# Patient Record
Sex: Female | Born: 1945
Health system: Southern US, Community
[De-identification: ages and names within clinical notes are randomized; demographics above are authoritative.]

## PROBLEM LIST (undated history)

## (undated) DIAGNOSIS — G2401 Drug induced subacute dyskinesia: Secondary | ICD-10-CM

## (undated) DIAGNOSIS — I10 Essential (primary) hypertension: Secondary | ICD-10-CM

## (undated) DIAGNOSIS — D509 Iron deficiency anemia, unspecified: Secondary | ICD-10-CM

## (undated) DIAGNOSIS — N183 Chronic kidney disease, stage 3 unspecified: Secondary | ICD-10-CM

## (undated) DIAGNOSIS — R269 Unspecified abnormalities of gait and mobility: Secondary | ICD-10-CM

## (undated) DIAGNOSIS — I890 Lymphedema, not elsewhere classified: Secondary | ICD-10-CM

## (undated) DIAGNOSIS — F319 Bipolar disorder, unspecified: Secondary | ICD-10-CM

## (undated) DIAGNOSIS — M1990 Unspecified osteoarthritis, unspecified site: Secondary | ICD-10-CM

## (undated) DIAGNOSIS — T4145XA Adverse effect of unspecified anesthetic, initial encounter: Secondary | ICD-10-CM

## (undated) DIAGNOSIS — N281 Cyst of kidney, acquired: Secondary | ICD-10-CM

## (undated) DIAGNOSIS — T8859XA Other complications of anesthesia, initial encounter: Secondary | ICD-10-CM

## (undated) DIAGNOSIS — G629 Polyneuropathy, unspecified: Secondary | ICD-10-CM

## (undated) DIAGNOSIS — R609 Edema, unspecified: Secondary | ICD-10-CM

## (undated) DIAGNOSIS — E785 Hyperlipidemia, unspecified: Secondary | ICD-10-CM

## (undated) DIAGNOSIS — E119 Type 2 diabetes mellitus without complications: Secondary | ICD-10-CM

## (undated) DIAGNOSIS — G4733 Obstructive sleep apnea (adult) (pediatric): Secondary | ICD-10-CM

## (undated) DIAGNOSIS — Z9989 Dependence on other enabling machines and devices: Secondary | ICD-10-CM

## (undated) HISTORY — DX: Cyst of kidney, acquired: N28.1

## (undated) HISTORY — DX: Hyperlipidemia, unspecified: E78.5

## (undated) HISTORY — DX: Chronic kidney disease, stage 3 (moderate): N18.3

## (undated) HISTORY — PX: FRACTURE SURGERY: SHX138

## (undated) HISTORY — PX: COLONOSCOPY: SHX174

## (undated) HISTORY — DX: Obstructive sleep apnea (adult) (pediatric): G47.33

## (undated) HISTORY — DX: Lymphedema, not elsewhere classified: I89.0

## (undated) HISTORY — DX: Chronic kidney disease, stage 3 unspecified: N18.30

## (undated) HISTORY — DX: Essential (primary) hypertension: I10

## (undated) HISTORY — DX: Polyneuropathy, unspecified: G62.9

## (undated) HISTORY — DX: Type 2 diabetes mellitus without complications: E11.9

## (undated) HISTORY — DX: Unspecified osteoarthritis, unspecified site: M19.90

## (undated) HISTORY — DX: Drug induced subacute dyskinesia: G24.01

## (undated) HISTORY — DX: Edema, unspecified: R60.9

## (undated) HISTORY — DX: Dependence on other enabling machines and devices: Z99.89

## (undated) HISTORY — DX: Iron deficiency anemia, unspecified: D50.9

## (undated) HISTORY — PX: ABDOMINAL HYSTERECTOMY: SHX81

## (undated) HISTORY — PX: DILATION AND CURETTAGE OF UTERUS: SHX78

## (undated) HISTORY — DX: Bipolar disorder, unspecified: F31.9

---
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
## 1994-11-18 HISTORY — PX: REIMPLANT URETER IN BLADDER: SHX488

## 2006-05-08 ENCOUNTER — Ambulatory Visit: Payer: Self-pay | Admitting: Family Medicine

## 2006-07-01 ENCOUNTER — Ambulatory Visit: Payer: Self-pay | Admitting: Family Medicine

## 2006-07-09 ENCOUNTER — Ambulatory Visit: Payer: Self-pay | Admitting: Internal Medicine

## 2006-07-16 ENCOUNTER — Encounter: Admission: RE | Admit: 2006-07-16 | Discharge: 2006-07-16 | Payer: Self-pay | Admitting: Family Medicine

## 2007-03-11 ENCOUNTER — Ambulatory Visit: Payer: Self-pay | Admitting: Family Medicine

## 2007-04-20 ENCOUNTER — Ambulatory Visit: Payer: Self-pay | Admitting: Family Medicine

## 2007-07-08 DIAGNOSIS — I10 Essential (primary) hypertension: Secondary | ICD-10-CM | POA: Insufficient documentation

## 2007-07-08 DIAGNOSIS — F329 Major depressive disorder, single episode, unspecified: Secondary | ICD-10-CM

## 2007-07-08 DIAGNOSIS — F3289 Other specified depressive episodes: Secondary | ICD-10-CM | POA: Insufficient documentation

## 2007-07-08 DIAGNOSIS — E119 Type 2 diabetes mellitus without complications: Secondary | ICD-10-CM

## 2007-07-08 DIAGNOSIS — D509 Iron deficiency anemia, unspecified: Secondary | ICD-10-CM

## 2007-10-30 ENCOUNTER — Ambulatory Visit: Payer: Self-pay | Admitting: Family Medicine

## 2007-10-30 DIAGNOSIS — E785 Hyperlipidemia, unspecified: Secondary | ICD-10-CM

## 2007-11-03 ENCOUNTER — Encounter: Payer: Self-pay | Admitting: Family Medicine

## 2007-11-03 ENCOUNTER — Telehealth: Payer: Self-pay | Admitting: Family Medicine

## 2007-11-03 LAB — CONVERTED CEMR LAB
ALT: 11 units/L (ref 0–35)
Albumin: 4.6 g/dL (ref 3.5–5.2)
Alkaline Phosphatase: 49 units/L (ref 39–117)
BUN: 24 mg/dL — ABNORMAL HIGH (ref 6–23)
Bilirubin, Direct: 0.1 mg/dL (ref 0.0–0.3)
CO2: 25 meq/L (ref 19–32)
Calcium: 9.7 mg/dL (ref 8.4–10.5)
Chloride: 105 meq/L (ref 96–112)
Cholesterol: 232 mg/dL — ABNORMAL HIGH (ref 0–200)
Creatinine, Ser: 1.03 mg/dL (ref 0.40–1.20)
Creatinine, Urine: 128.6 mg/dL
Eosinophils Relative: 3 % (ref 0–5)
Glucose, Bld: 82 mg/dL (ref 70–99)
HCT: 39.1 % (ref 36.0–46.0)
LDL Cholesterol: 112 mg/dL — ABNORMAL HIGH (ref 0–99)
Lymphocytes Relative: 29 % (ref 12–46)
Microalb, Ur: 3.8 mg/dL — ABNORMAL HIGH (ref 0.00–1.89)
Monocytes Absolute: 0.3 10*3/uL (ref 0.1–1.0)
Platelets: 178 10*3/uL (ref 150–400)
Potassium: 4.3 meq/L (ref 3.5–5.3)
TSH: 0.729 microintl units/mL (ref 0.350–5.50)
Triglycerides: 57 mg/dL (ref <150)
VLDL: 11 mg/dL (ref 0–40)
WBC: 5 10*3/uL (ref 4.0–10.5)

## 2007-11-09 ENCOUNTER — Encounter: Admission: RE | Admit: 2007-11-09 | Discharge: 2007-11-09 | Payer: Self-pay | Admitting: Family Medicine

## 2008-02-23 ENCOUNTER — Ambulatory Visit: Payer: Self-pay | Admitting: Family Medicine

## 2008-02-23 DIAGNOSIS — N3000 Acute cystitis without hematuria: Secondary | ICD-10-CM | POA: Insufficient documentation

## 2008-02-23 LAB — CONVERTED CEMR LAB
Glucose, Urine, Semiquant: NEGATIVE
Nitrite: NEGATIVE
Specific Gravity, Urine: 1.02

## 2008-09-05 ENCOUNTER — Telehealth: Payer: Self-pay | Admitting: Family Medicine

## 2008-09-13 ENCOUNTER — Ambulatory Visit: Payer: Self-pay | Admitting: Family Medicine

## 2008-09-23 ENCOUNTER — Ambulatory Visit: Payer: Self-pay | Admitting: Internal Medicine

## 2008-10-07 ENCOUNTER — Encounter: Payer: Self-pay | Admitting: Family Medicine

## 2008-10-07 ENCOUNTER — Encounter: Payer: Self-pay | Admitting: Internal Medicine

## 2008-10-07 ENCOUNTER — Ambulatory Visit: Payer: Self-pay | Admitting: Internal Medicine

## 2008-10-11 ENCOUNTER — Encounter: Payer: Self-pay | Admitting: Internal Medicine

## 2008-12-16 ENCOUNTER — Encounter: Admission: RE | Admit: 2008-12-16 | Discharge: 2008-12-16 | Payer: Self-pay | Admitting: Geriatric Medicine

## 2008-12-16 IMAGING — US US RENAL
1 series · 14 of 25 positions shown · non-contrast
Comparison: None.

CLINICAL DATA: 62-year-old female with abdominal pain.  The right
lower quadrant pain.  Diabetes.

RENAL/URINARY TRACT ULTRASOUND
TECHNIQUE: Complete ultrasound examination of the urinary tract
was performed including evaluation of the kidneys, renal collecting
systems, and urinary bladder.

[Series 1: us renal · 0.33mm/px · 14 of 38 slices shown]
[im 1/38]
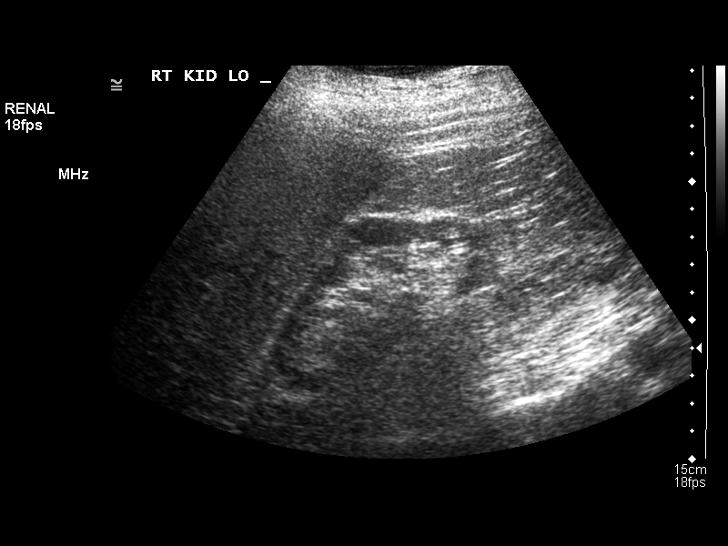
[im 4/38]
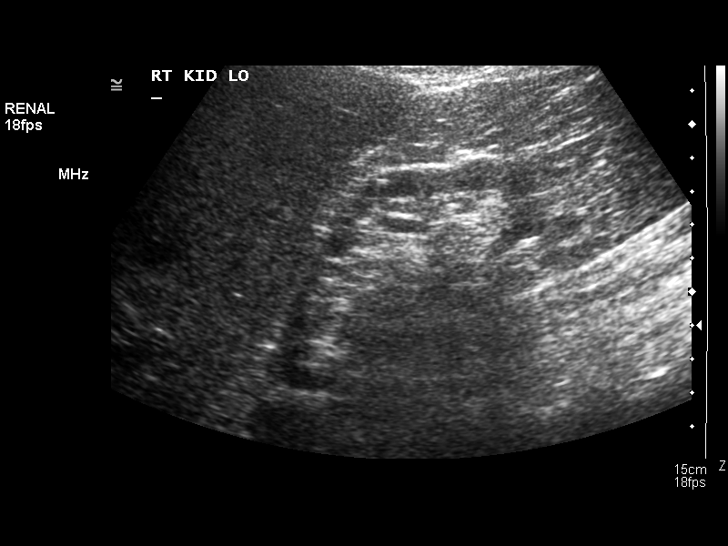
[im 7/38]
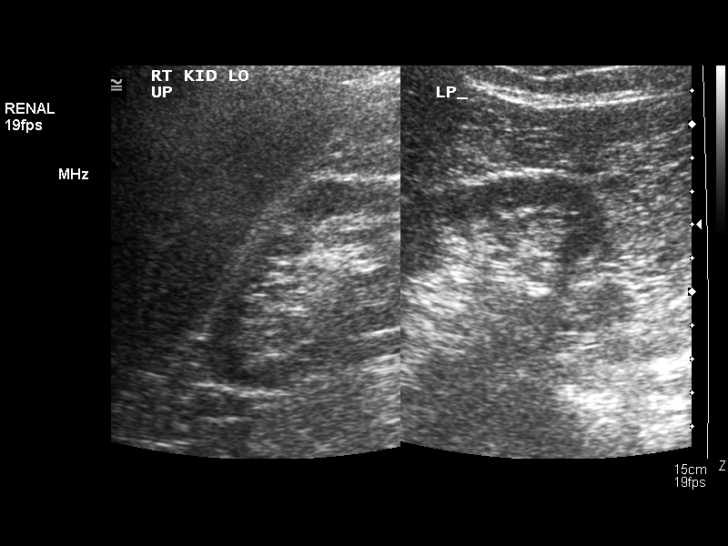
[im 10/38]
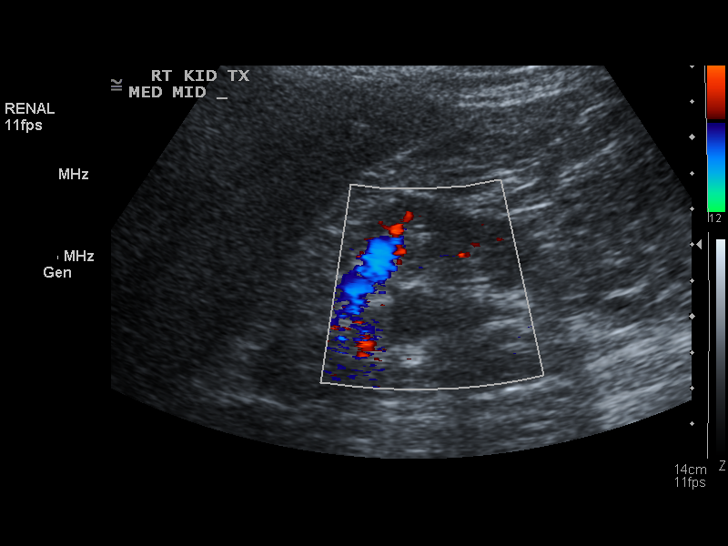
[im 13/38]
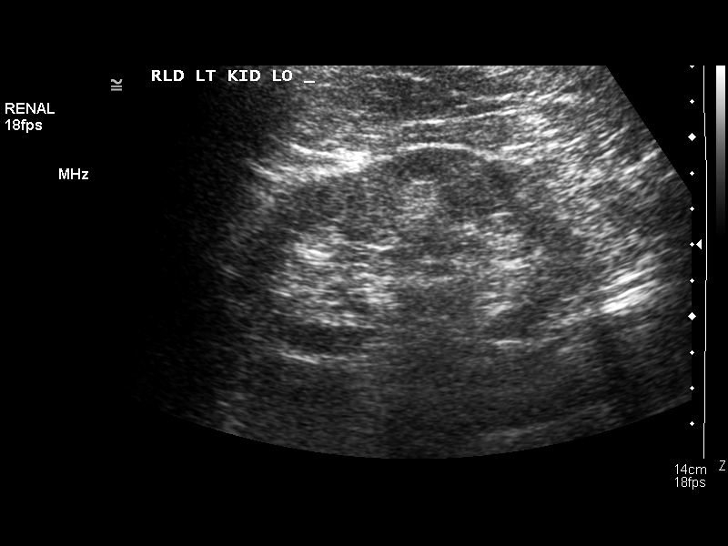
[im 14/38]
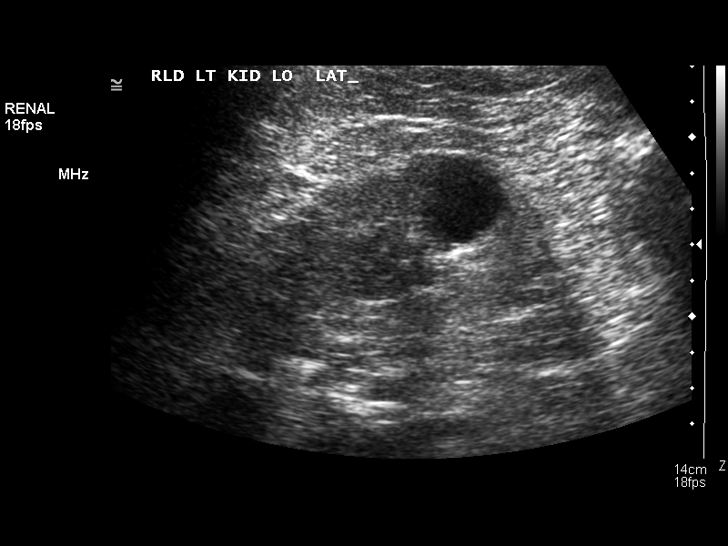
[im 17/38]
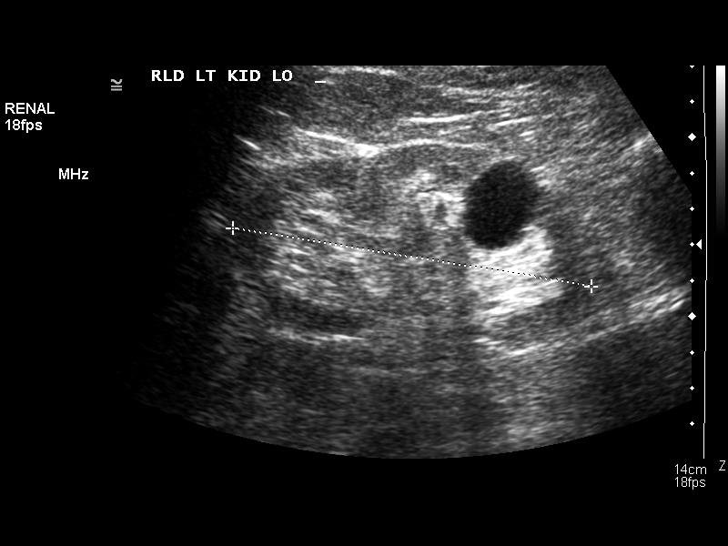
[im 21/38]
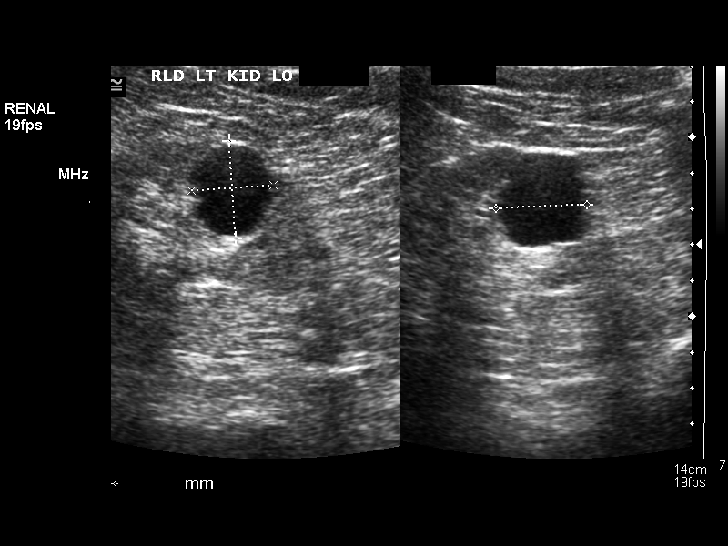
[im 24/38]
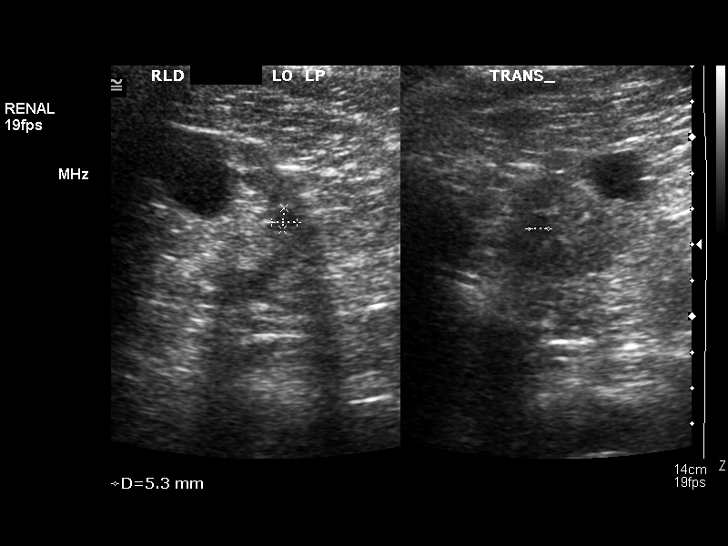
[im 25/38]
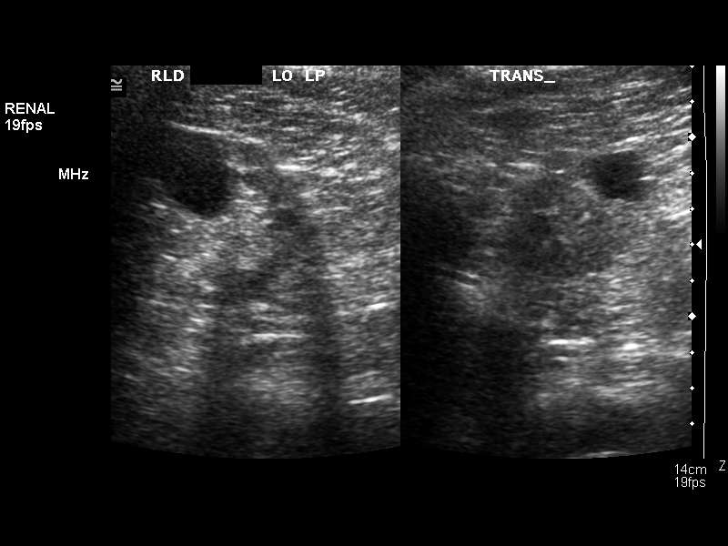
[im 28/38]
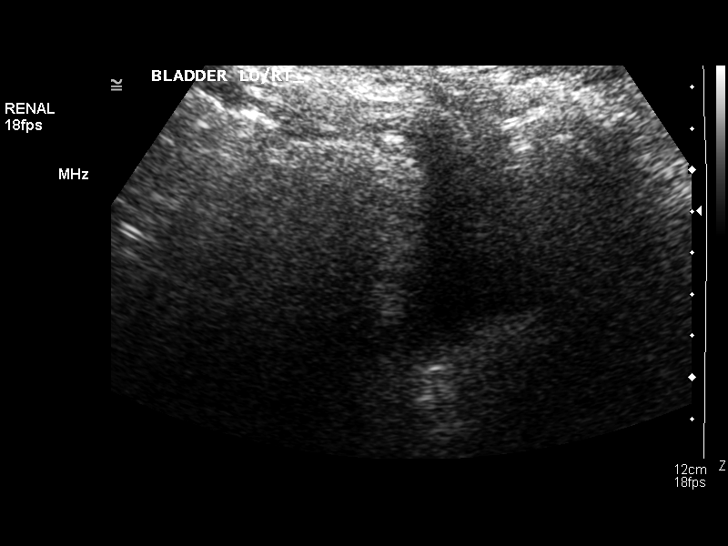
[im 31/38]
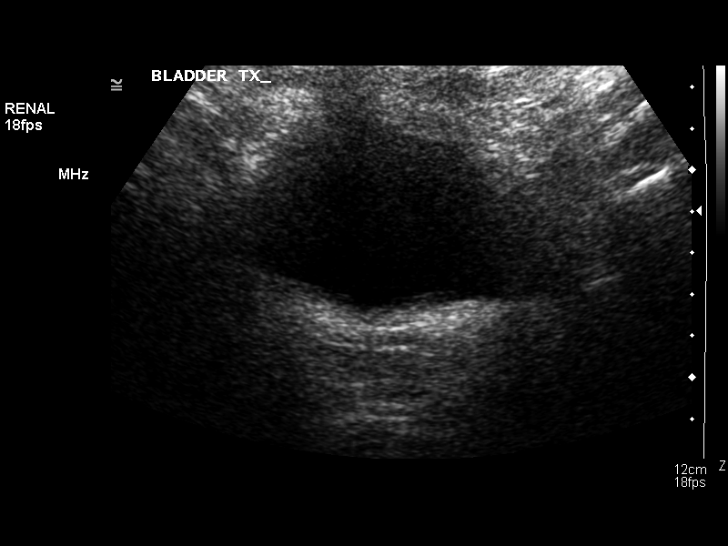
[im 34/38]
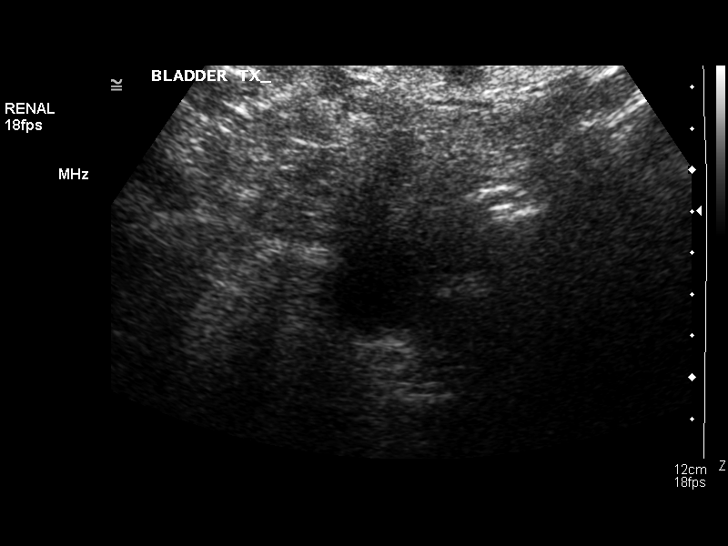
[im 38/38]
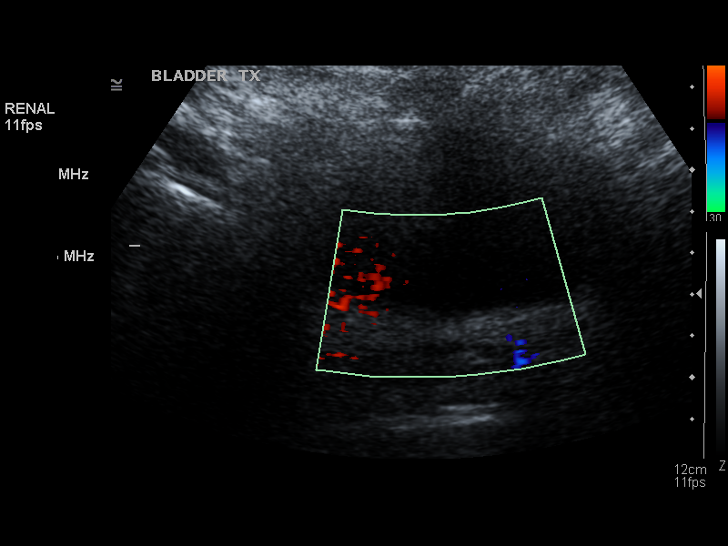

[14 of 25 positions shown; findings below may reference images not displayed]

FINDINGS: There is thinning of the right renal cortex.  The right
renal pelvis is mildly prominent, but there is no evidence of
hydronephrosis (probably an extrarenal pelvis).  No focal right
renal lesion identified. The right kidney measures 10.3 cm in
length.

The left kidney is not obstructed.  There is some thinning of left
renal cortex, but not to the degree of that seen on the right.
There is a simple appearing mid pole cyst measuring 2.7 x 2.3 x
cm.  The left kidney measures 10.4 cm in length.  A lower pole
renal pyramid simulates the appearance of the smaller cyst, but is
felt to be within normal limits.

The bladder is relatively decompressed and otherwise within normal
limits. A normal left ureteral jet is noted.
IMPRESSION: 1.  No acute findings identified.
2.  Right greater than left thinning of renal cortex.  Left renal
mid pole simple cyst.

## 2009-01-03 ENCOUNTER — Encounter: Admission: RE | Admit: 2009-01-03 | Discharge: 2009-01-03 | Payer: Self-pay | Admitting: Geriatric Medicine

## 2009-01-03 IMAGING — CT CT ABDOMEN W/ CM
2 of 5 series · 17 of 46 positions shown, 19 images · IV contrast (READICAT/WATER & [ID] OMNI 300)
Comparison: [DATE] ultrasound of the kidneys.

CT ABDOMEN

CLINICAL DATA: Right lower quadrant pain.

CT ABDOMEN AND PELVIS WITH CONTRAST
TECHNIQUE: Multidetector CT imaging of the abdomen and pelvis was
performed using the standard protocol following bolus
administration of intravenous contrast.
Contrast: 125 ml of [8M].

[Series 3: routine abdomen · axial · 0.82mm/px · z∈[-458,-43]mm · 14 of 93 slices shown, 16 images]
[im 5/93  soft-tissue]
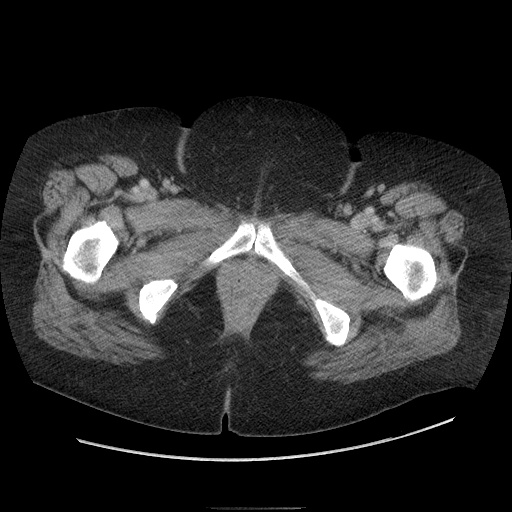
[im 5/93  bone]
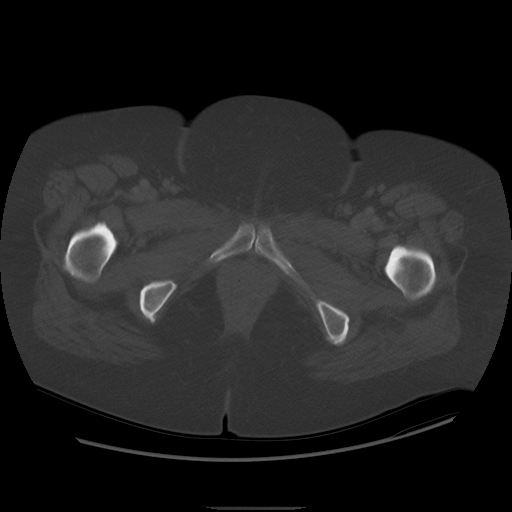
[im 14/93  soft-tissue]
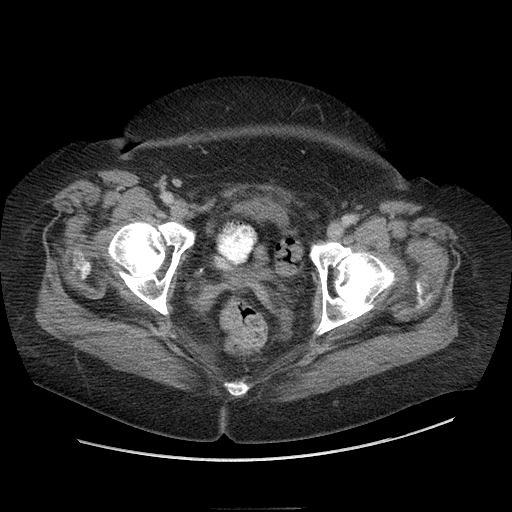
[im 19/93  soft-tissue]
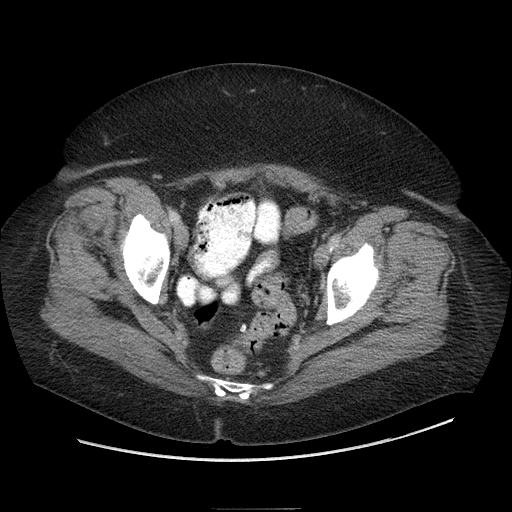
[im 24/93  soft-tissue]
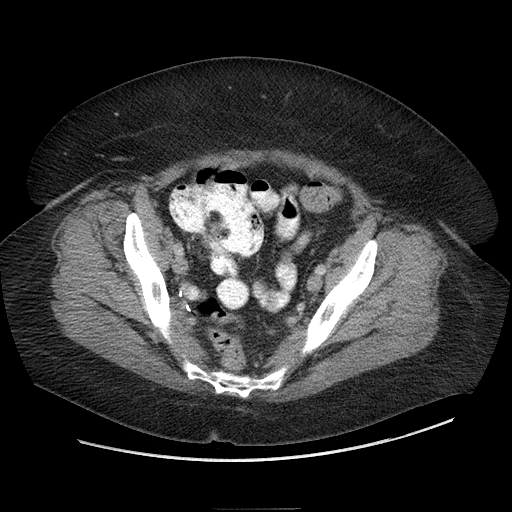
[im 33/93  soft-tissue]
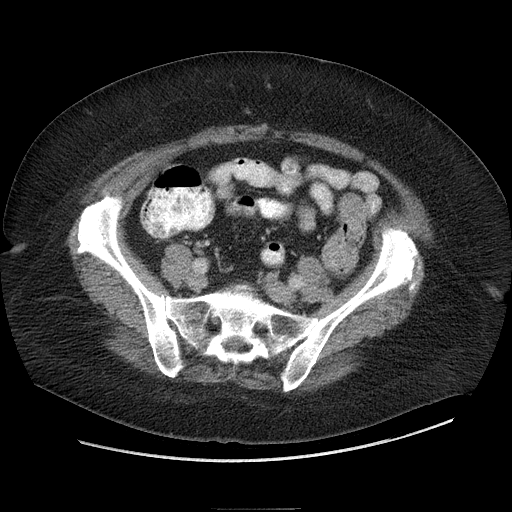
[im 37/93  soft-tissue]
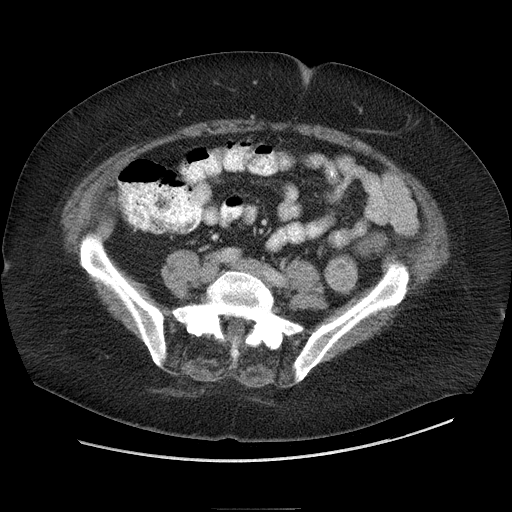
[im 42/93  soft-tissue]
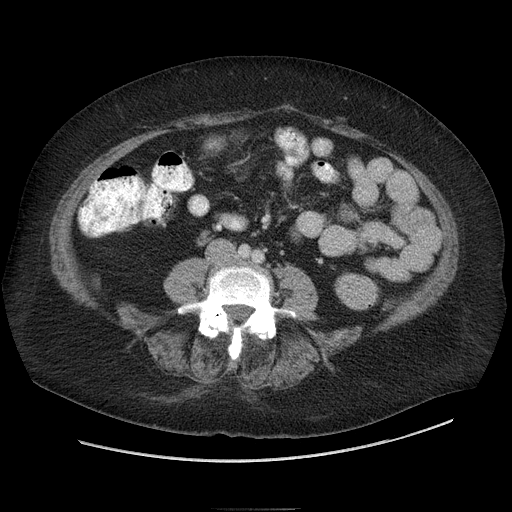
[im 51/93  soft-tissue]
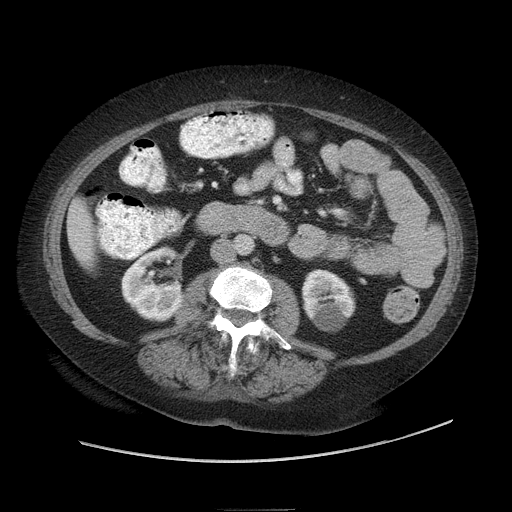
[im 56/93  soft-tissue]
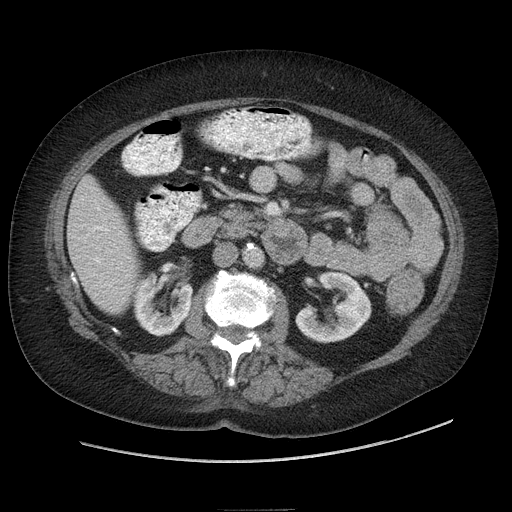
[im 56/93  bone]
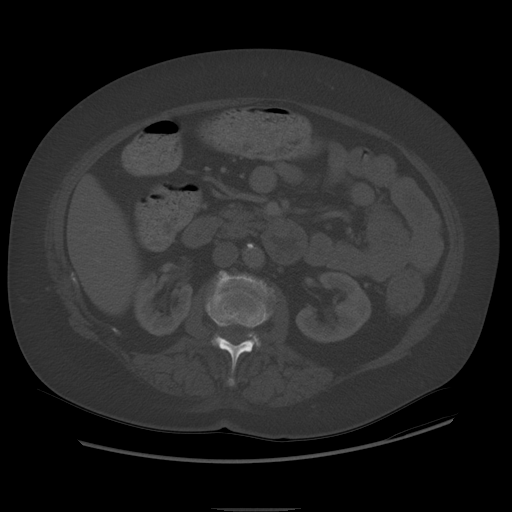
[im 60/93  soft-tissue]
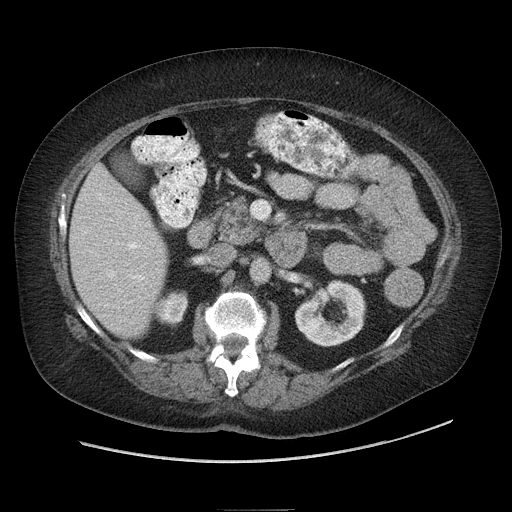
[im 70/93  soft-tissue]
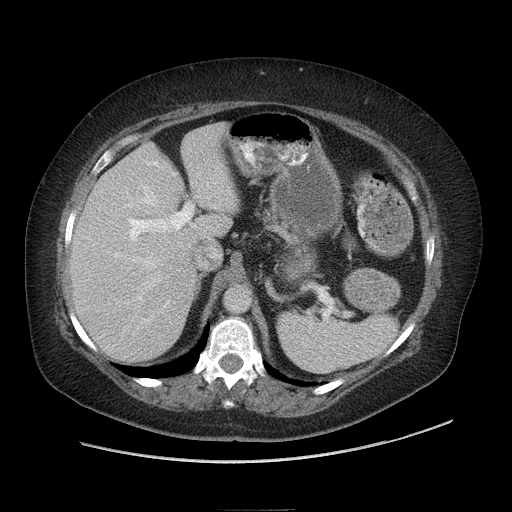
[im 74/93  soft-tissue]
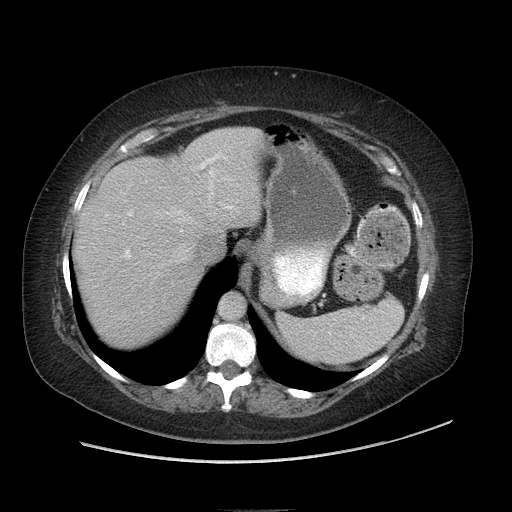
[im 79/93  soft-tissue]
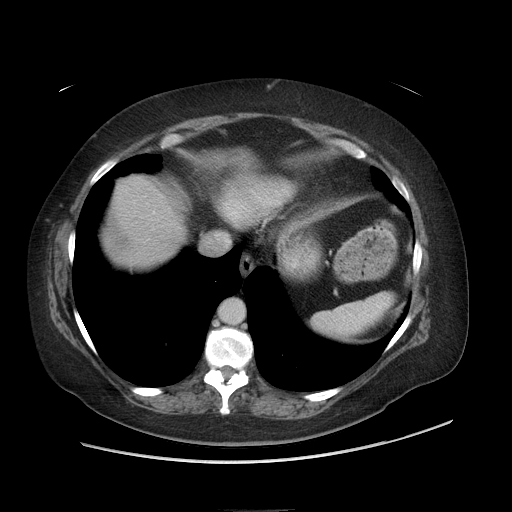
[im 88/93  soft-tissue]
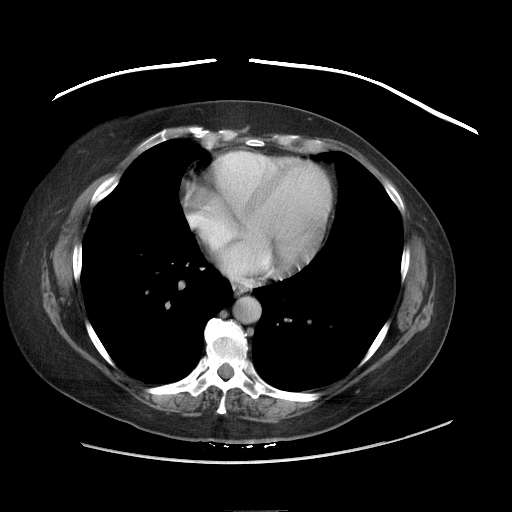

[Series 602: sagittal body · sagittal · 0.94mm/px · 3 of 168 slices shown]
[im 56/168  soft-tissue]
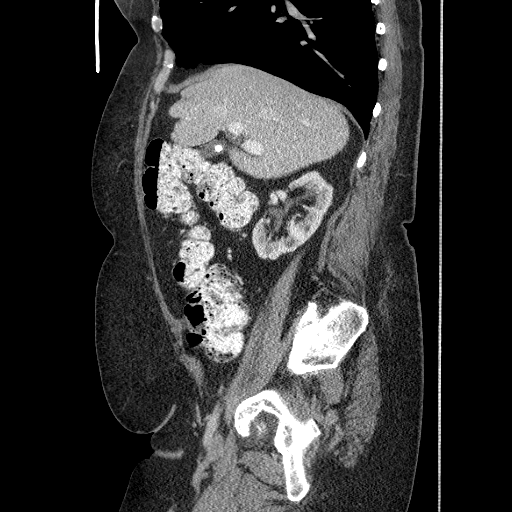
[im 75/168  soft-tissue]
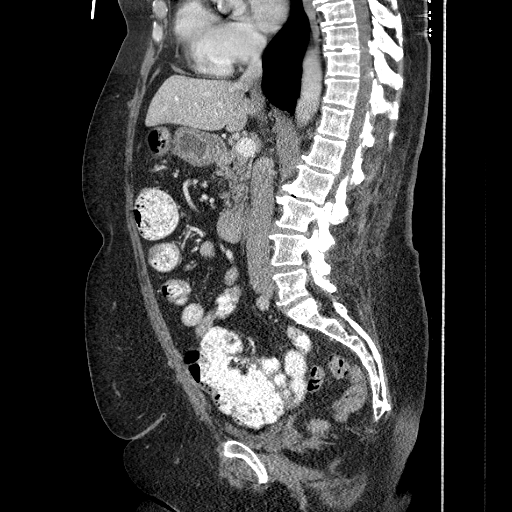
[im 93/168  soft-tissue]
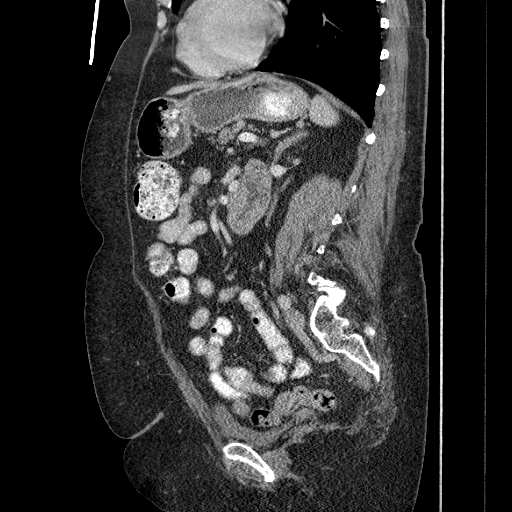

[17 of 46 positions shown; findings below may reference images not displayed]

FINDINGS: Scans of the lung bases are negative.  There are several
calcified gallstones noted within the gallbladder. There is no
gallbladder wall thickening. There is no intrahepatic or
extrahepatic biliary dilatation.  There is a 2.7 cm in diameter
simple appearing lower pole left renal cyst present.  Mild cortical
renal atrophy is seen associated with the kidneys bilaterally.  The
liver, spleen, pancreas, and adrenal glands have a normal
appearance.  There is no adenopathy.  The abdominal aorta is normal
in caliber.  The bowel, as visualized, has a normal appearance.
IMPRESSION: 1.  Cholelithiasis.
2.  2.7 cm in diameter simple appearing lower pole left renal cyst.
3.  Bilateral cortical renal atrophy.

CT PELVIS
FINDINGS: There is no pelvic mass or adenopathy.  There has been a
previous hysterectomy.  There is no free pelvic fluid.  The
appendix is not visualized.  However, there is no evidence for
appendicitis.
IMPRESSION: Previous hysterectomy.  Negative CT of the pelvis.

## 2009-02-12 ENCOUNTER — Emergency Department (HOSPITAL_COMMUNITY): Admission: EM | Admit: 2009-02-12 | Discharge: 2009-02-12 | Payer: Self-pay | Admitting: Emergency Medicine

## 2009-02-12 IMAGING — CR DG SHOULDER 2+V*R*
2 series · 2 of 2 positions shown · non-contrast
Comparison: None

CLINICAL DATA: Status post fall

RIGHT SHOULDER - 2+ VIEW

[w shoulder ap internal righ *]
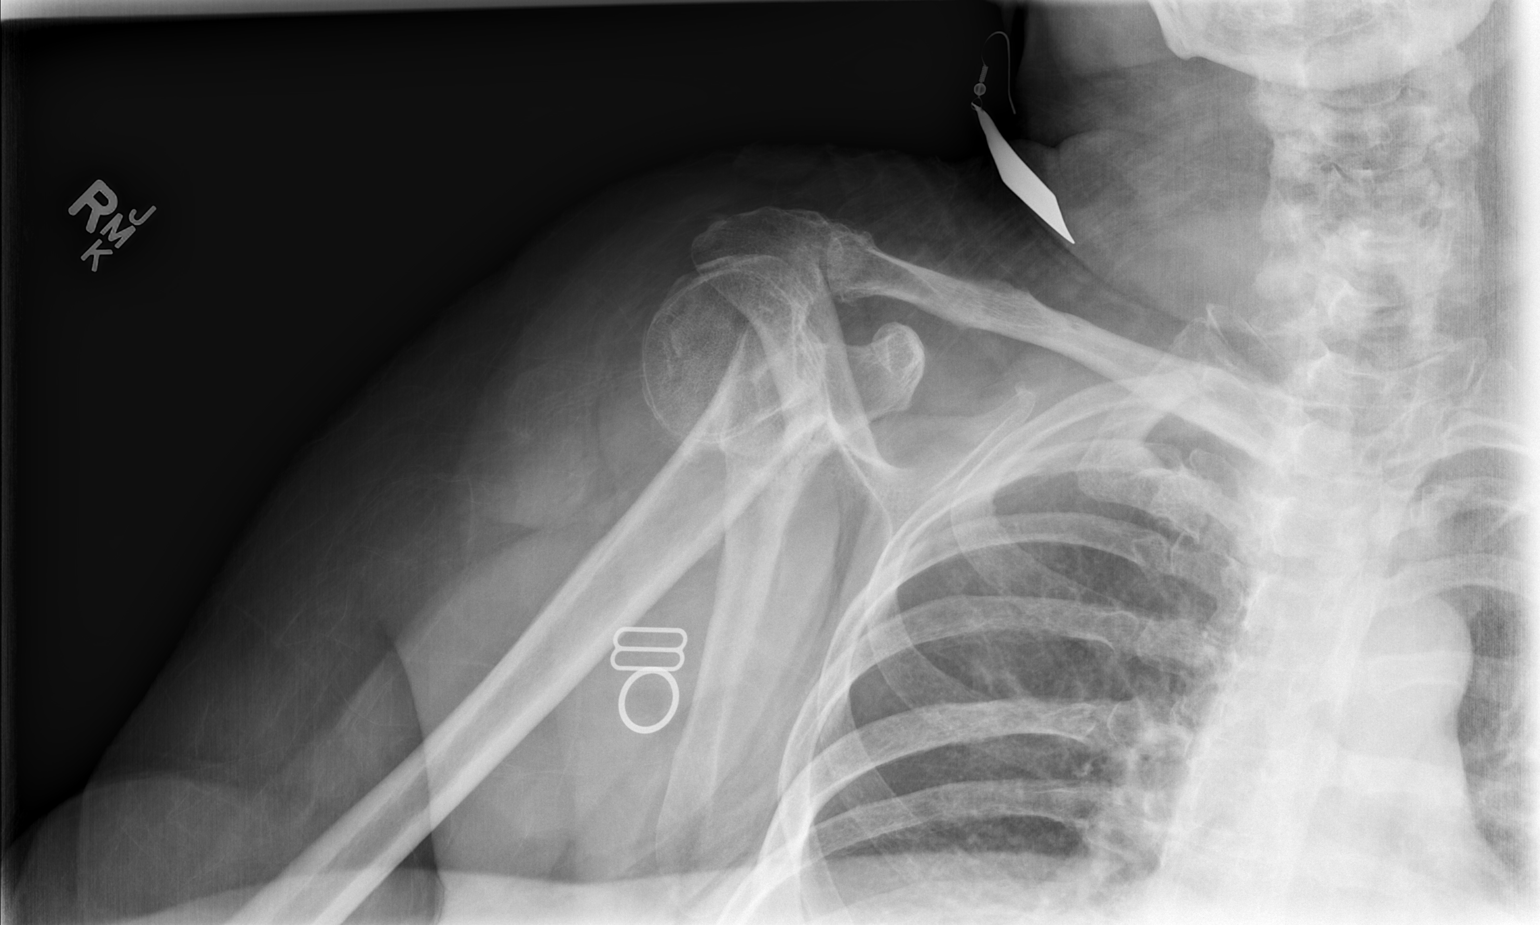

[w shoulder y view right *]
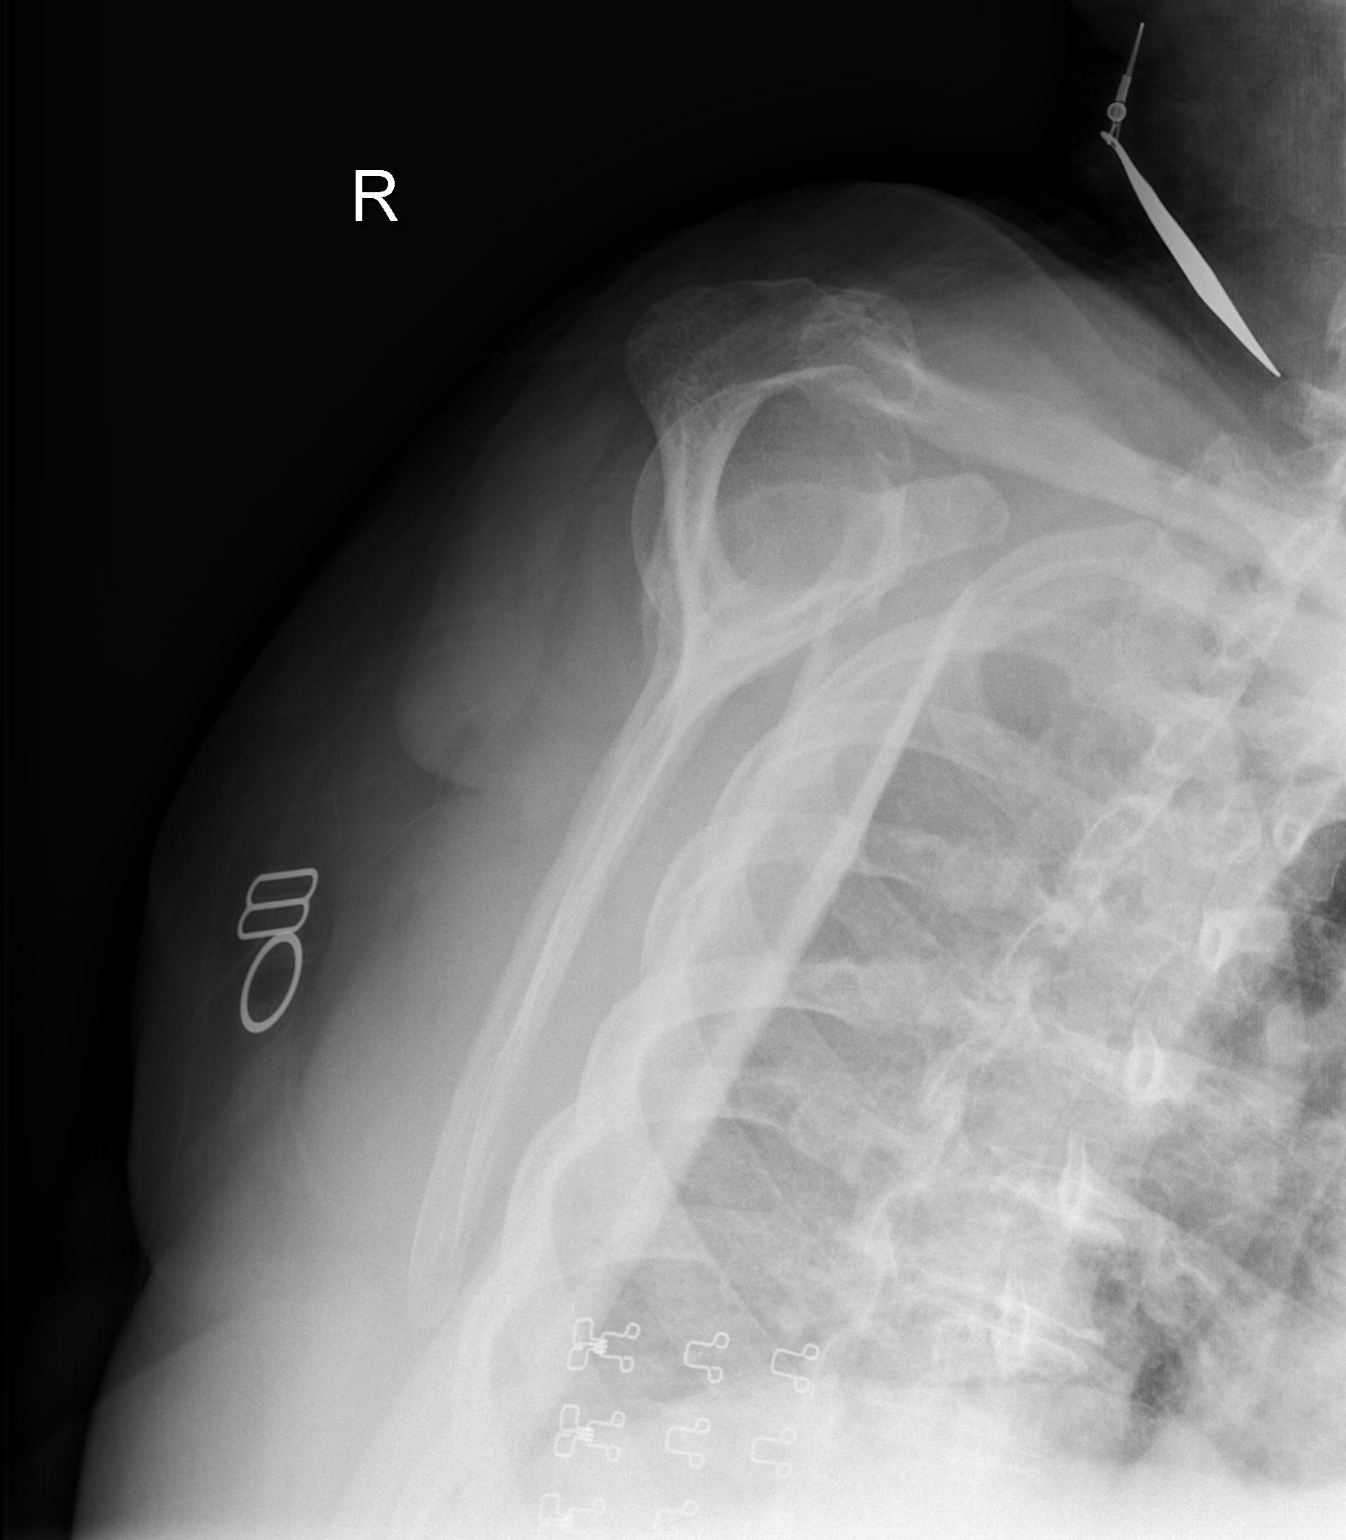

[2 of 2 positions shown; findings below may reference images not displayed]

FINDINGS: There is a subcapital humeral neck fracture.

There is a medial and anterior displacement of the distal fracture
fragment.

The humeral head remains located.
IMPRESSION: 1.  There is a fracture involving the surgical neck of the humerus
with medial and anterior displacement of distal fracture fragments
are

## 2009-02-12 IMAGING — CR DG HUMERUS 2V *R*
2 series · 2 of 2 positions shown · non-contrast
Comparison: None

CLINICAL DATA: Pain

RIGHT HUMERUS - 2+ VIEW

[w humerus lat right *]
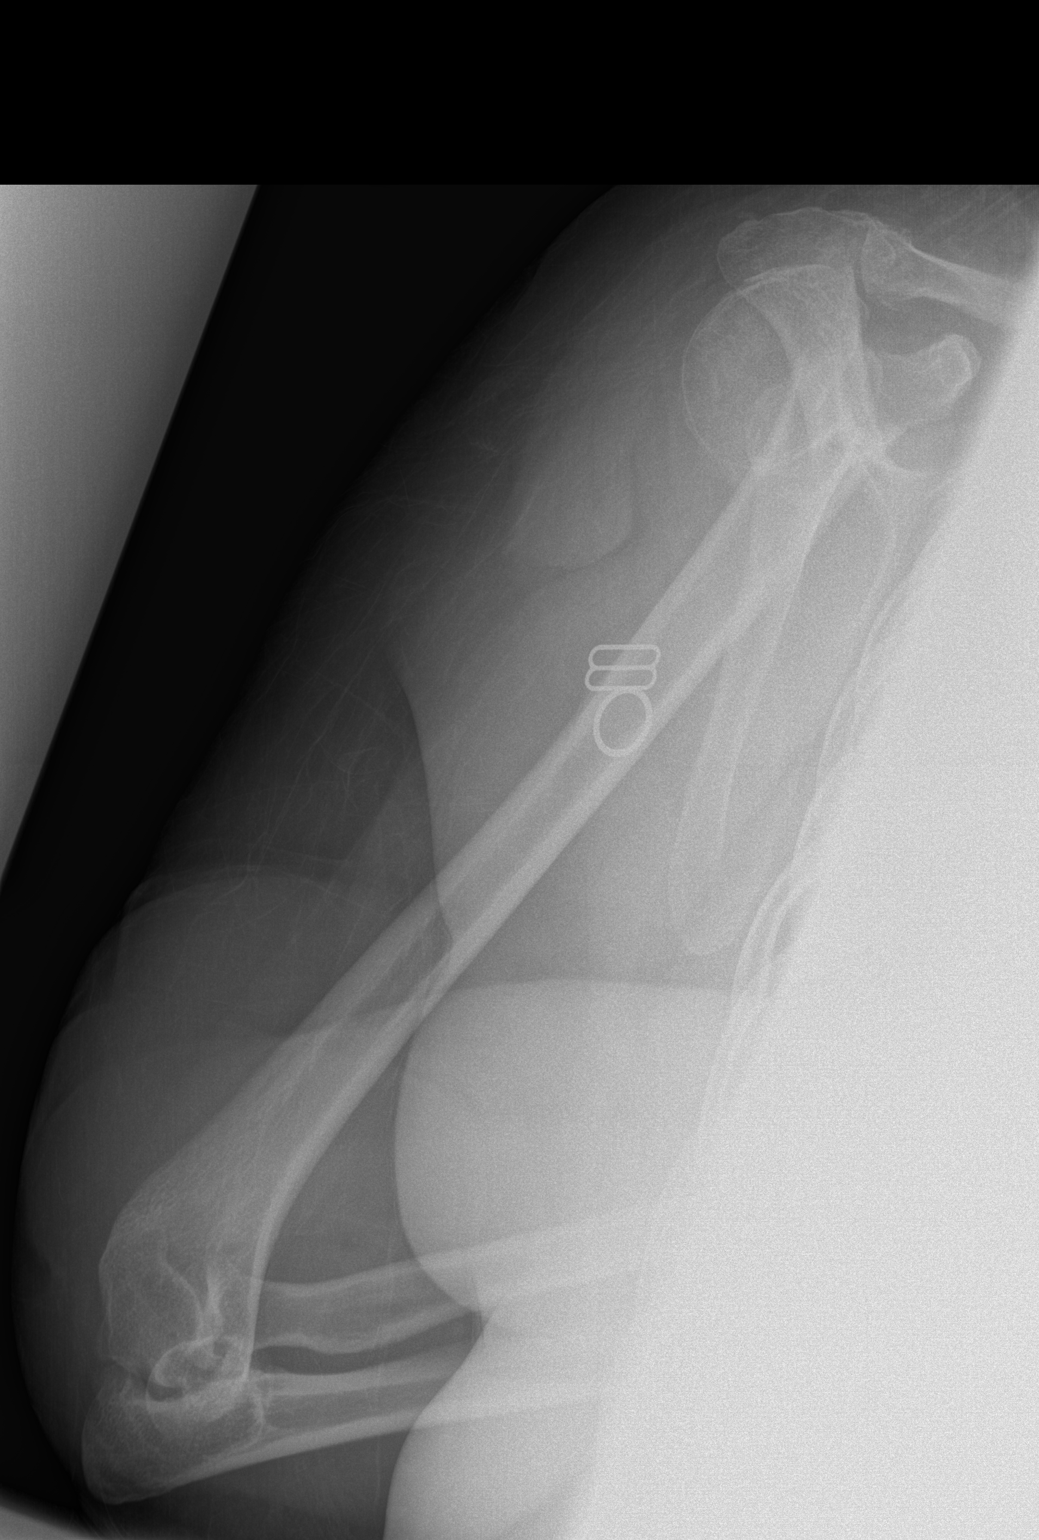

[w humerus ap right *]
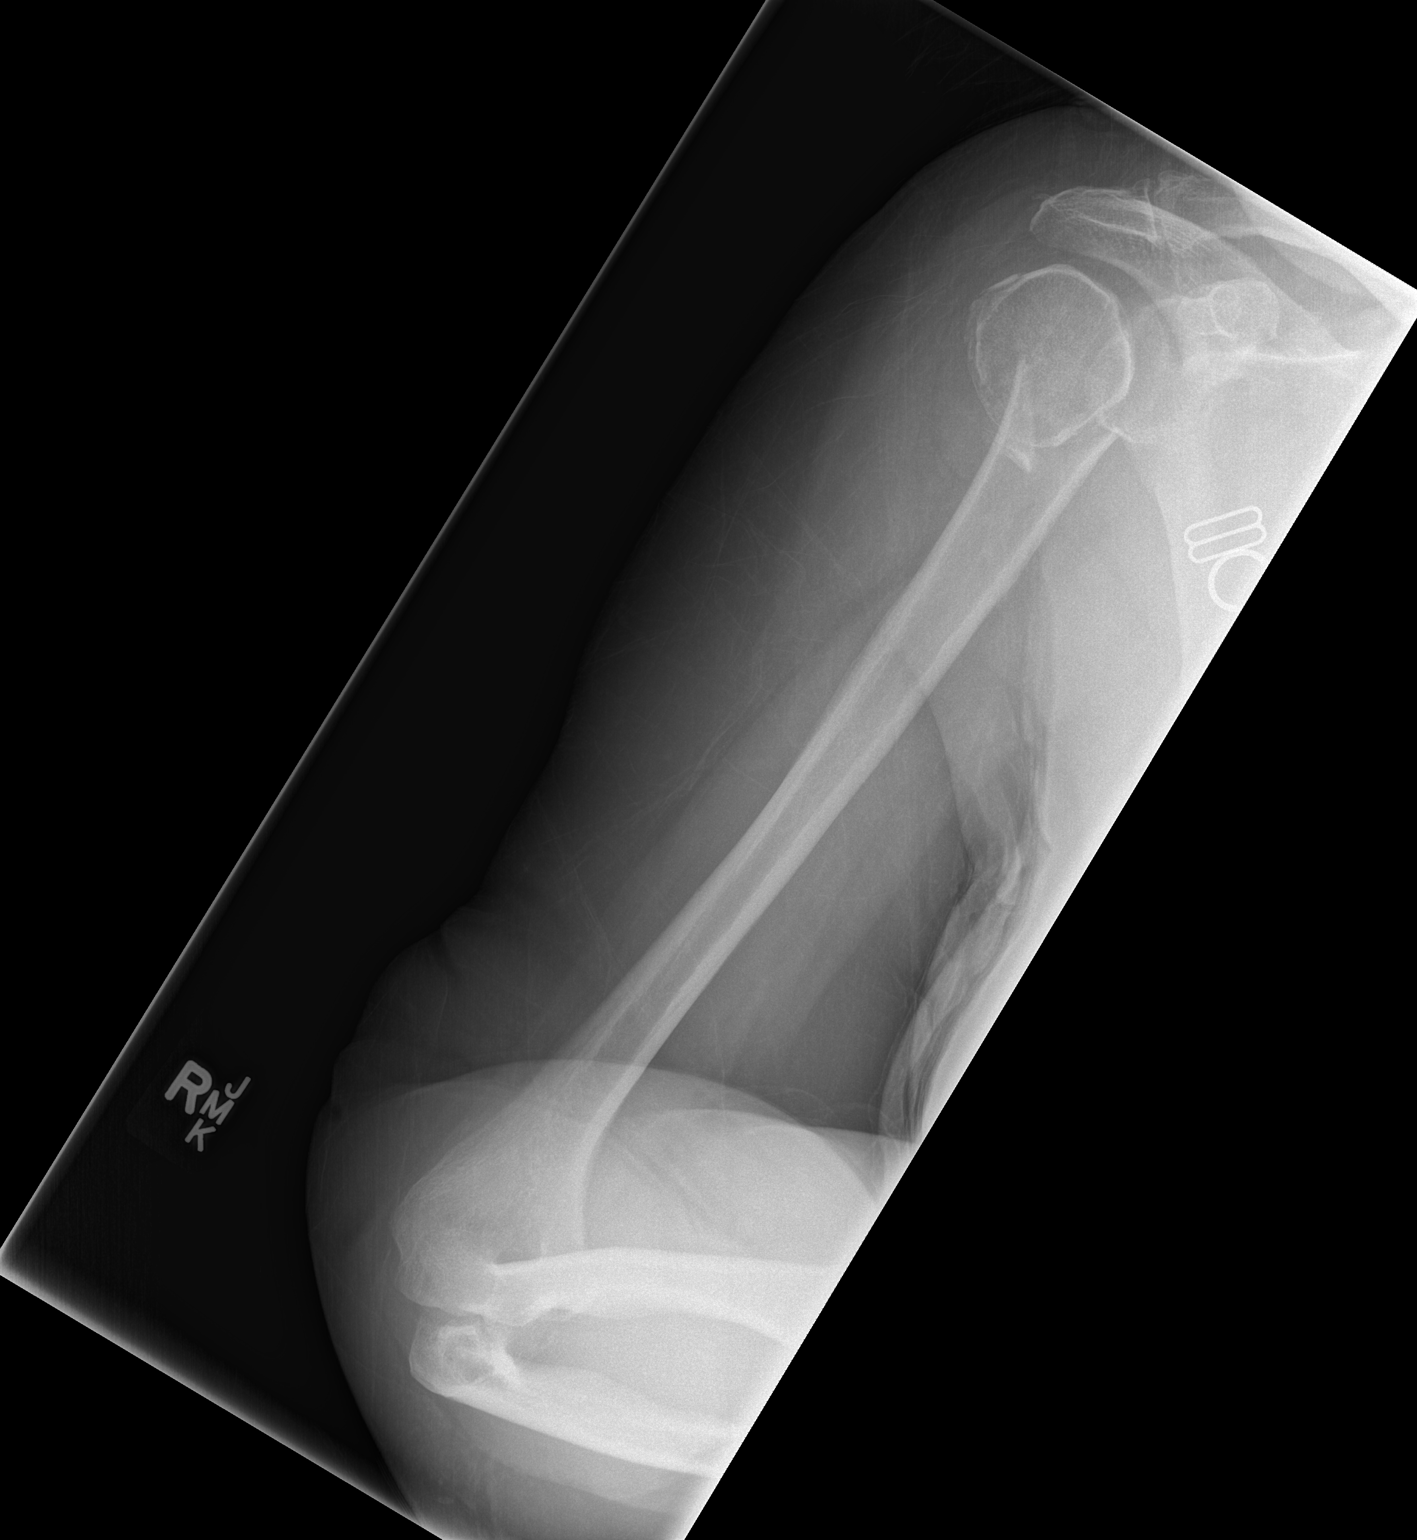

[2 of 2 positions shown; findings below may reference images not displayed]

FINDINGS: There is a fracture deformity involving the surgical neck
of the humerus.

There is medial and anterior displacement of the distal fracture
fragments.

The fracture fragments overlap by approximately 2 cm.
IMPRESSION: 1.  Fracture involves the surgical neck of the humerus with medial
and anterior displacement of the impacted fracture fragment

## 2011-04-05 NOTE — Assessment & Plan Note (Signed)
[facility] OFFICE NOTE   NAME:[name] [name] BUSLER                       MRN:          [id number]  DATE:07/01/2006                            DOB:          10/20/1946    This is a 65 year old woman here for a complete physical examination.  I had  an introductory visit with her on May 09, 2006.  I refer to this note  concerning details of her past medical history, family history, social  history, habits, etcetera.  At that time, we started her on Lyrica to help  with neuropathic burning type pains that she had in her hands and feet.  It  was felt this was most likely due to her diabetes.  I had prescribed Lyrica  75 mg b.i.d. but she found that this made her too sleepy.  So after several  days she cut back to once a day.  She tolerates this quite well now.  She  does think it is helping and would like to continue it.  Her only other  complaint today is constant stuffiness in her nose and head.  She is  currently using Astelin nasal spray b.i.d., would like to try something  different.  She has never tried a steroid nasal spray apparently.   ALLERGIES:  NONE.   CURRENT MEDICATIONS:  1. Actos plus metformin 15/500 t.i.d.  2. Simvastatin 10 mg per day.  3. Hyzaar 50/12.5 once a day.  4. Lamictal 100 mg two tablets a day.  5. Geodon 40 mg per day.  6. Lyrica 75 mg once a day.  7. Oxazepam 10 mg as needed for anxiety.  8. She uses a CPAP machine at night for sleep.  9. Fluticasone 0.05% cream b.i.d. as needed for rashes.   OBJECTIVE:  VITAL SIGNS:  Height 5 foot 5 inches, weight 222, BP 90/60,  pulse 72 and regular.  GENERAL:  She is quite obese.  SKIN:  Free of significant lesions.  EYES:  Clear.  EARS:  Clear.  THROAT:  Pharynx is clear.  NECK:  Supple without lymphadenopathy or masses.  LUNGS:  Clear.  CARDIAC:  Rate and rhythm regular without gallops, murmurs, rubs.  Distal  pulses are full.  EKG is  within normal limits.  BREASTS/AXILLAE:  Clear.  ABDOMEN:  Soft.  Normal bowel sounds.  Nontender.  No masses.  EXTERNAL GENITALIA:  Mildly atrophic.  PELVIC:  Not performed.  RECTAL:  No masses or tenderness.  Stool hemoccult negative.  EXTREMITIES:  No clubbing, cyanosis, or edema.  NEUROLOGIC:  Grossly intact.   She had fasting laboratories done here on May 08, 2006.  This is remarkable  for a normal hemoglobin A1c of 5.9.  Normal renal function but an elevated  LDL of 160.  She claims that she was really fasting that day, however, of  interest her psychiatrist, Dr. Tiajuana Amass, obtained a fasting lipid  panel on June 18, 2006, which came back totally normal with an LDL of 77.   ASSESSMENT/PLAN:  1. Complete physical.  We talked about increasing her  exercise and loosing      weight.  We will setup a screening mammogram.  We will also set up her      first bone density evaluation.  2. Hypertension, stable.  3. Hyperlipidemia, probably stable.  We will continue to watch this      closely.  4. Type 2 diabetes mellitus, stable.  5. Neuropathy.  We will continue Lyrica as is, although I told her she      should go back to b.i.d. if her symptoms do not improve.  6. Bipolar disorder, stable.  7. Sleep apnea, stable.  8. Allergies.  I gave her samples to switch from Astelin to Nasonex sprays      once a day.                                   [name] [name]. Clent Ridges, MD   SAF/MedQ  DD:  07/01/2006  DT:  07/01/2006  Job #:  [id number]

## 2011-05-20 ENCOUNTER — Ambulatory Visit (INDEPENDENT_AMBULATORY_CARE_PROVIDER_SITE_OTHER): Payer: Medicare Other | Admitting: Internal Medicine

## 2011-05-20 ENCOUNTER — Encounter: Payer: Self-pay | Admitting: Internal Medicine

## 2011-05-20 VITALS — BP 112/64 | HR 68 | Ht 65.0 in | Wt 239.0 lb

## 2011-05-20 DIAGNOSIS — D509 Iron deficiency anemia, unspecified: Secondary | ICD-10-CM

## 2011-05-20 DIAGNOSIS — Z9283 Personal history of failed moderate sedation: Secondary | ICD-10-CM

## 2011-05-20 DIAGNOSIS — D649 Anemia, unspecified: Secondary | ICD-10-CM

## 2011-05-20 NOTE — Progress Notes (Signed)
  Subjective:    Patient ID: [name] [name], female    DOB: 09-Apr-1946, 65 y.o.   MRN: [id number]  HPI 65 yo ww with recent PCP visit and was found  To have iron deficiency anemia. No GI symptoms at this time. She had a colonoscopy in 2009 which was negative (normal - small hyperplastic polyp only)   Review of Systems Otherwise negative/as per HPI    Objective:   Physical Exam  Constitutional: She is oriented to person, place, and time. She appears well-developed and well-nourished.       obese  HENT:  Head: Normocephalic and atraumatic.  Mouth/Throat: Oropharynx is clear and moist. No oropharyngeal exudate.  Eyes: Conjunctivae are normal. Pupils are equal, round, and reactive to light. No scleral icterus.  Neck: Neck supple. No thyromegaly present.  Cardiovascular: Normal rate, regular rhythm and normal heart sounds.  Exam reveals no gallop and no friction rub.   No murmur heard. Pulmonary/Chest: Effort normal and breath sounds normal.  Abdominal: Soft. Bowel sounds are normal. She exhibits no mass. There is no tenderness.  Musculoskeletal: She exhibits edema.       Nonpitting edema of the upper and lower extremities bilaterally  Lymphadenopathy:    She has no cervical adenopathy.  Neurological: She is alert and oriented to person, place, and time.  Psychiatric: She has a normal mood and affect.          Assessment & Plan:

## 2011-05-20 NOTE — Patient Instructions (Signed)
Hemocult stool cards given, please follow the instructions on the cards and mail back to our office when finished. We will contact you will assessment and plans after labs have been reviewed.

## 2011-05-21 ENCOUNTER — Encounter: Payer: Self-pay | Admitting: Internal Medicine

## 2011-05-21 NOTE — Assessment & Plan Note (Addendum)
When she was in the office I did not have her iron/tibc raw #'s but since have seen them and TIBC is high so I agree she has iron-deficiency anemia. She was given hemoccult cards. She had a negative colonoscopy in 2009. She will need an EGD at least but also may need a repeat colonoscopy but would need deep sedation due to failed moderate sedation in 2009. We will await hemoccults and contact her also. It sounds like she does eat iron in diet. Note that she had anemia on her problem list in 2008 but I cannot see hgb from then  Labs did come showing a high TIBC of 460, iron 24 and saturation of 5% hemoglobin 10.6 and MCV of 95. Her B12 was higher than normal. Given this we will call recommend that she go ahead with an EGD and a colonoscopy I think at this time. She can still completely Hemoccults as if the EGD and colonoscopy are unrevealing then a small bowel capsule endoscopy should be considered, especially she is heme positive.

## 2011-05-31 ENCOUNTER — Telehealth: Payer: Self-pay | Admitting: Gastroenterology

## 2011-05-31 NOTE — Telephone Encounter (Signed)
Per Dr. Leone Payor patient was told of results of iron studies. Patient was set up for pre-visit for August 16, at 8:30 am and an Endo/Colon with Propofol on August 21 at 8:30 am. Patient was also told to complete the hemoccult cards.

## 2011-05-31 NOTE — Telephone Encounter (Signed)
Message copied by Bernita Buffy on Fri May 31, 2011  9:19 AM ------      Message from: Stan Head E      Created: Thu May 30, 2011  4:40 PM      Regarding: testing       She is supposed to be doing hemoccults but let her know that I did receive iron studies and they do confirm iron-deficiency so it is appropriate to have a colonoscopy and an EGD - with propofol sedation.      She can schedule those now even if she has not done the hemoccults but should still complete them             Dx is 280.9 iron deficiency anemia and remember she needs propofol

## 2011-07-04 ENCOUNTER — Ambulatory Visit (AMBULATORY_SURGERY_CENTER): Payer: Medicare Other | Admitting: *Deleted

## 2011-07-04 ENCOUNTER — Other Ambulatory Visit: Payer: Medicare Other

## 2011-07-04 VITALS — Ht 65.0 in | Wt 251.9 lb

## 2011-07-04 DIAGNOSIS — D509 Iron deficiency anemia, unspecified: Secondary | ICD-10-CM

## 2011-07-04 MED ORDER — PEG-KCL-NACL-NASULF-NA ASC-C 100 G PO SOLR: ORAL | Status: DC

## 2011-07-09 ENCOUNTER — Ambulatory Visit (AMBULATORY_SURGERY_CENTER): Payer: Medicare Other | Admitting: Internal Medicine

## 2011-07-09 ENCOUNTER — Encounter: Payer: Self-pay | Admitting: Internal Medicine

## 2011-07-09 VITALS — BP 145/48 | HR 79 | Temp 97.4°F | Resp 18 | Ht 65.0 in | Wt 251.0 lb

## 2011-07-09 DIAGNOSIS — R933 Abnormal findings on diagnostic imaging of other parts of digestive tract: Secondary | ICD-10-CM

## 2011-07-09 DIAGNOSIS — K2970 Gastritis, unspecified, without bleeding: Secondary | ICD-10-CM | POA: Insufficient documentation

## 2011-07-09 DIAGNOSIS — D649 Anemia, unspecified: Secondary | ICD-10-CM

## 2011-07-09 DIAGNOSIS — D126 Benign neoplasm of colon, unspecified: Secondary | ICD-10-CM

## 2011-07-09 DIAGNOSIS — K2990 Gastroduodenitis, unspecified, without bleeding: Secondary | ICD-10-CM

## 2011-07-09 DIAGNOSIS — Z1289 Encounter for screening for malignant neoplasm of other sites: Secondary | ICD-10-CM

## 2011-07-09 DIAGNOSIS — D509 Iron deficiency anemia, unspecified: Secondary | ICD-10-CM

## 2011-07-09 HISTORY — PX: ESOPHAGOGASTRODUODENOSCOPY: SHX1529

## 2011-07-09 LAB — HEMOCCULT SLIDES (X 3 CARDS)
Fecal Occult Blood: NEGATIVE
OCCULT 2: NEGATIVE
OCCULT 3: NEGATIVE
OCCULT 5: NEGATIVE

## 2011-07-09 MED ORDER — DEXTROSE 5 % IV SOLN: INTRAVENOUS | Status: DC

## 2011-07-09 MED ORDER — SODIUM CHLORIDE 0.9 % IV SOLN: 500.0000 mL | INTRAVENOUS | Status: DC

## 2011-07-09 NOTE — Patient Instructions (Addendum)
The stomach was irritated and had gastritis with a few erosions, which are tiny ulcerations in the stomach lining. It was biopsied and we will notify you about the results. The colon showed a possible abnormality at the appendix which is probably just a bulging, normal appendix. It was biopsied. There was also a small, benign-appearing polyp that was removed. We will notify you about these results and the hemoccult cards also. Iva Boop, MD, FACG  DISCHARGE INSTRUCTIONS GIVEN PER BLUE AND GREEN SHEETS.  WE WILL MAIL YOU A LETTER IN 1-2 WEEKS OR CALL YOU FROM THE OFFICE WITH YOUR PATHOLOGY RESULTS AND DR Leisha Trinkle'S RECOMMENDATIONS FOR YOUR NEXT PROCEDURE AND ANY FURTHER INSTRUCTIONS  CONTINUE ALL MEDICINES AS DIRECTED. DR Leone Payor DID NOT CHANGE OR ADD ANY MEDICATIONS

## 2011-07-10 ENCOUNTER — Telehealth: Payer: Self-pay | Admitting: *Deleted

## 2011-07-10 NOTE — Telephone Encounter (Signed)

## 2011-07-15 NOTE — Progress Notes (Signed)
Quick Note:  No major problems seen on biopsies The polyp was not precancerous  Please call from office and let her know - she also had a heme + stool  So will recommend capsule endo of small bowel to eval heme + stool and iron-def anemia  [facility] - no letter, place colon recall 10 yrs ______

## 2011-07-16 ENCOUNTER — Telehealth: Payer: Self-pay

## 2011-07-16 NOTE — Telephone Encounter (Signed)
Patient advised of the above  Capsule scheduled for 07/25/11 8:00, she will come for capsule pre-visit 07/19/11 2:00

## 2011-07-16 NOTE — Telephone Encounter (Signed)
Message copied by Annett Fabian on Tue Jul 16, 2011  8:20 AM ------      Message from: Stan Head E      Created: Mon Jul 15, 2011 10:28 PM       No major problems seen on biopsies      The polyp was not precancerous            Please call from office and let her know - she also had a heme + stool            So will recommend capsule endo of small bowel to eval heme + stool and iron-def anemia            [facility] - no letter, place colon recall 10 yrs

## 2011-07-19 ENCOUNTER — Telehealth: Payer: Self-pay | Admitting: *Deleted

## 2011-07-19 NOTE — Telephone Encounter (Signed)
Pt was due to come in today for Capsule teaching, but she called at 3:45pm and stated she overslept. R/S her to 07/23/11 at 2pm. Pt was instructed to hold her Iron tablets beginning today. She has an appt with her Psychiatrist at 1:15pm and maybe a little late.

## 2011-07-24 ENCOUNTER — Telehealth: Payer: Self-pay

## 2011-07-24 NOTE — Telephone Encounter (Signed)
Pt instructed on prep and procedure for capsule endoscopy. All pts questions answered. Pt verbalized understanding.

## 2011-07-25 ENCOUNTER — Ambulatory Visit (INDEPENDENT_AMBULATORY_CARE_PROVIDER_SITE_OTHER): Payer: Medicare Other | Admitting: Internal Medicine

## 2011-07-25 DIAGNOSIS — D509 Iron deficiency anemia, unspecified: Secondary | ICD-10-CM

## 2011-07-25 DIAGNOSIS — R195 Other fecal abnormalities: Secondary | ICD-10-CM

## 2011-07-25 DIAGNOSIS — K2970 Gastritis, unspecified, without bleeding: Secondary | ICD-10-CM

## 2011-07-25 HISTORY — PX: GIVENS CAPSULE STUDY: SHX5432

## 2011-07-25 NOTE — Progress Notes (Signed)
Patient here today for capsule endoscopy. Patient tolerated well.   Lot # [id number] Exp 2013-06

## 2011-07-28 ENCOUNTER — Encounter: Payer: Self-pay | Admitting: Internal Medicine

## 2011-07-28 NOTE — Assessment & Plan Note (Signed)
Iron supplementation and follow-up with PCP

## 2011-07-28 NOTE — Patient Instructions (Signed)
We will call the patient and recommend iron supplementation and f/u PCP

## 2011-07-28 NOTE — Progress Notes (Signed)
Capsule endoscopy of the small bowel was negative for any small bowel lesions that would cause an anemia. Small bowel was normal. Some slight limitation of distal small bowel views.

## 2011-07-29 ENCOUNTER — Telehealth: Payer: Self-pay

## 2011-07-29 NOTE — Telephone Encounter (Signed)
Message copied by Annett Fabian on Mon Jul 29, 2011  9:32 AM ------      Message from: Iva Boop      Created: Sun Jul 28, 2011 10:26 AM      Regarding: capsule results       Let her know small bowel was ok      Mild erosive gastritis might have caused heme + stool            She needs to stay on ferrous sulfate and see Dr. Pete Glatter about the follow-up of that      If she does not have an appointment or lab follow-up with him she should by end of October at latest            Please cc him on capsule (I sent a copy of the "office visit")      See my other note about that also

## 2011-07-29 NOTE — Telephone Encounter (Signed)
Patient advised of the results and asked to follow up with Dr Pete Glatter

## 2011-08-22 ENCOUNTER — Other Ambulatory Visit: Payer: Self-pay | Admitting: Geriatric Medicine

## 2011-08-22 DIAGNOSIS — Z1231 Encounter for screening mammogram for malignant neoplasm of breast: Secondary | ICD-10-CM

## 2011-09-06 ENCOUNTER — Ambulatory Visit
Admission: RE | Admit: 2011-09-06 | Discharge: 2011-09-06 | Disposition: A | Discharge status: Home / Self Care | Payer: Medicare Other | Source: Ambulatory Visit | Attending: Geriatric Medicine | Admitting: Geriatric Medicine

## 2011-09-06 DIAGNOSIS — Z1231 Encounter for screening mammogram for malignant neoplasm of breast: Secondary | ICD-10-CM

## 2011-09-06 IMAGING — MG MM SCREEN MAMMOGRAM BILATERAL
4 series · 4 of 4 positions shown · non-contrast
Comparison: none

DG SCREEN MAMMOGRAM BILATERAL
Bilateral CC and MLO view(s) were taken.
Technologist: LILL-TONE

DIGITAL SCREENING MAMMOGRAM WITH CAD:
The breast tissue is almost entirely fatty.  No masses or malignant type calcifications are 
identified.  Compared with prior studies.
Images were processed with CAD.

[R CC]
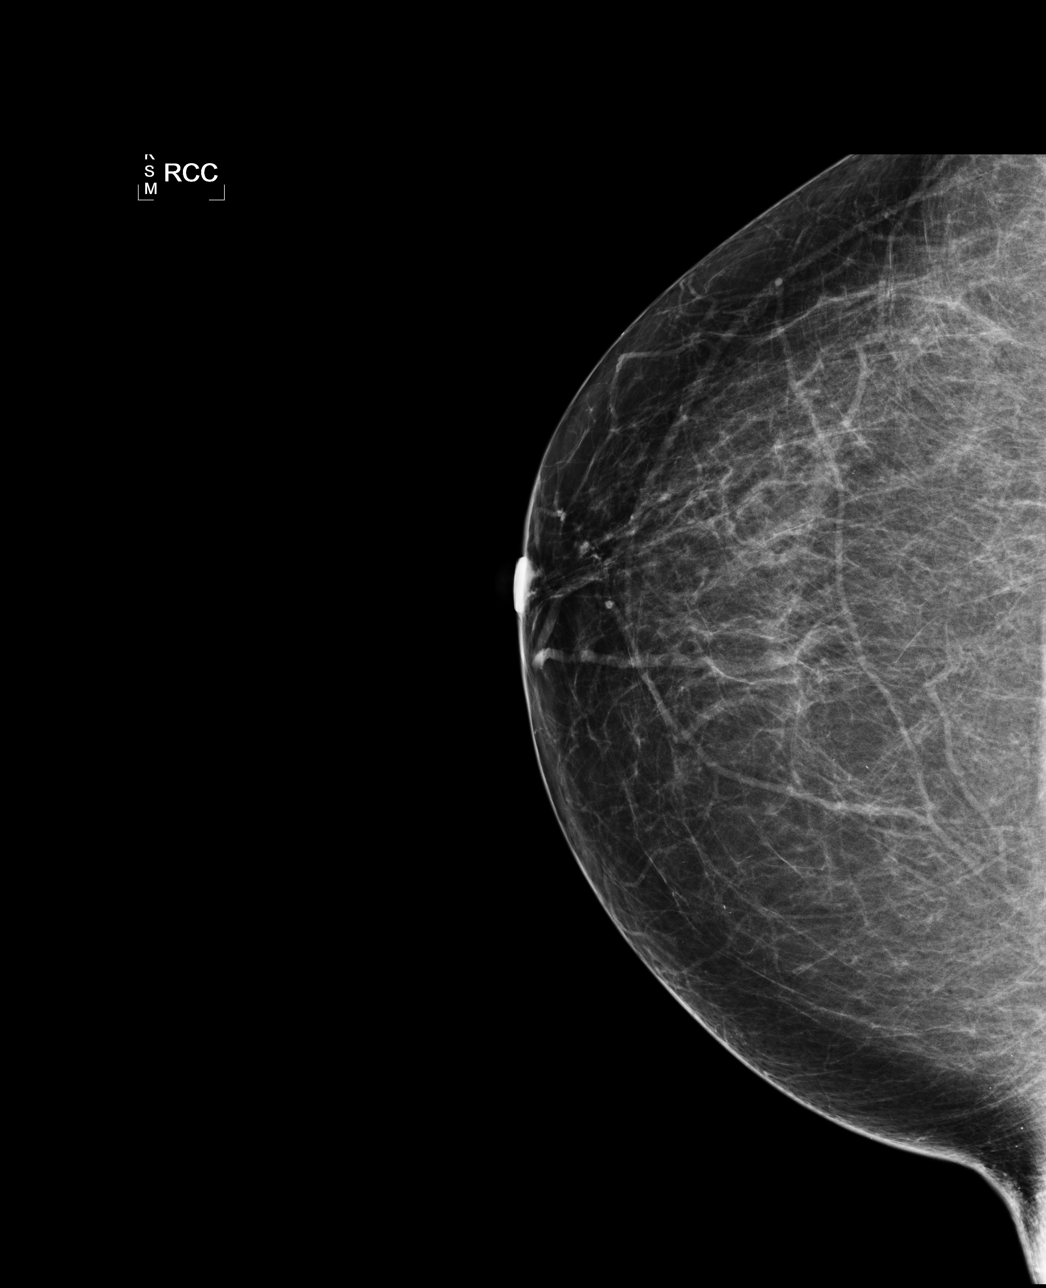

[L CC]
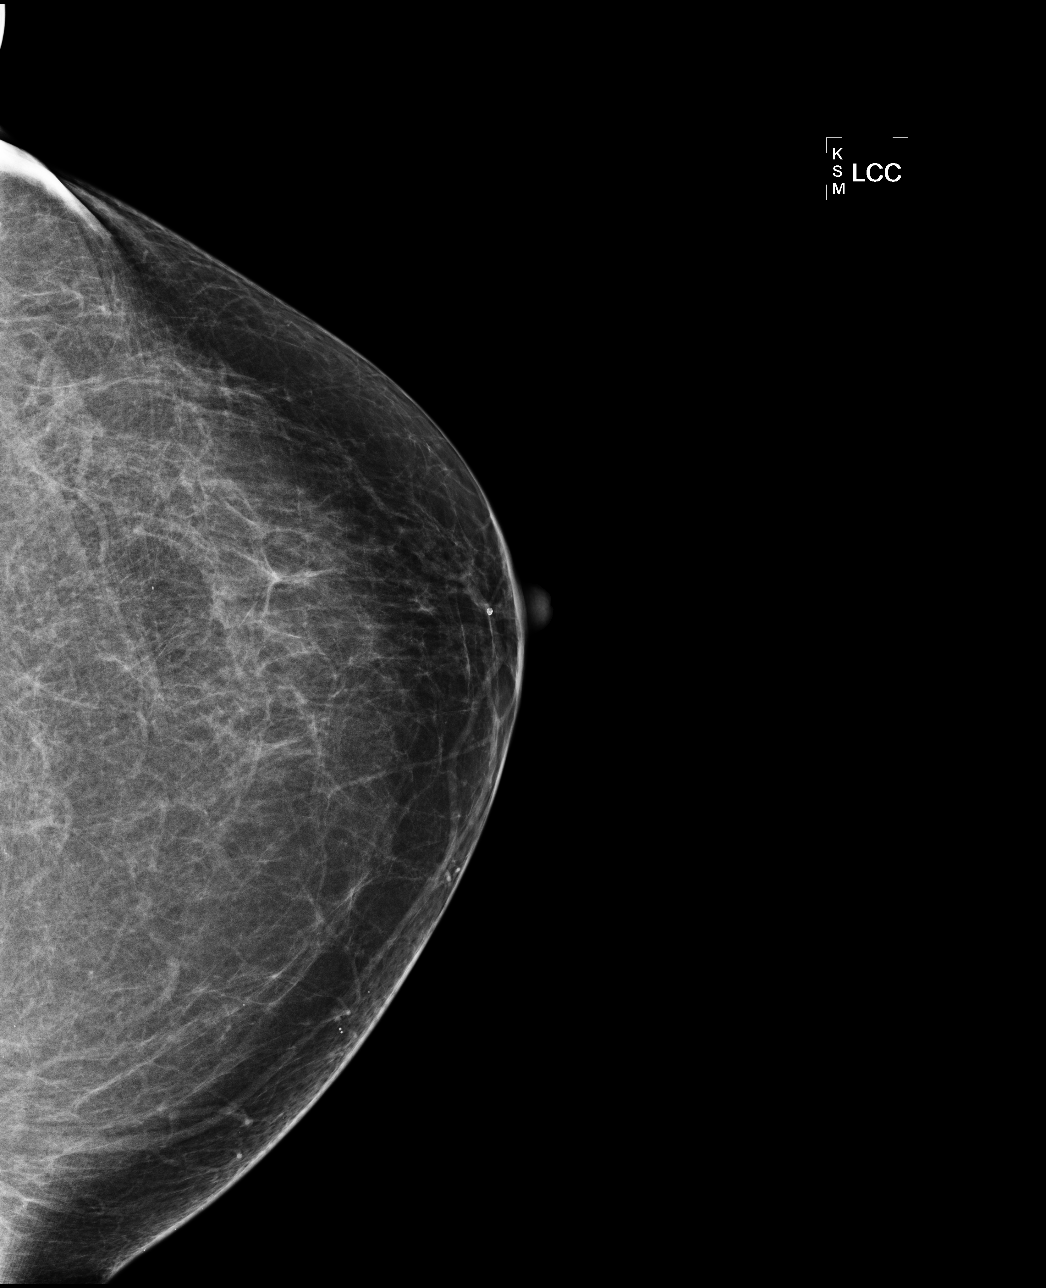

[L MLO]
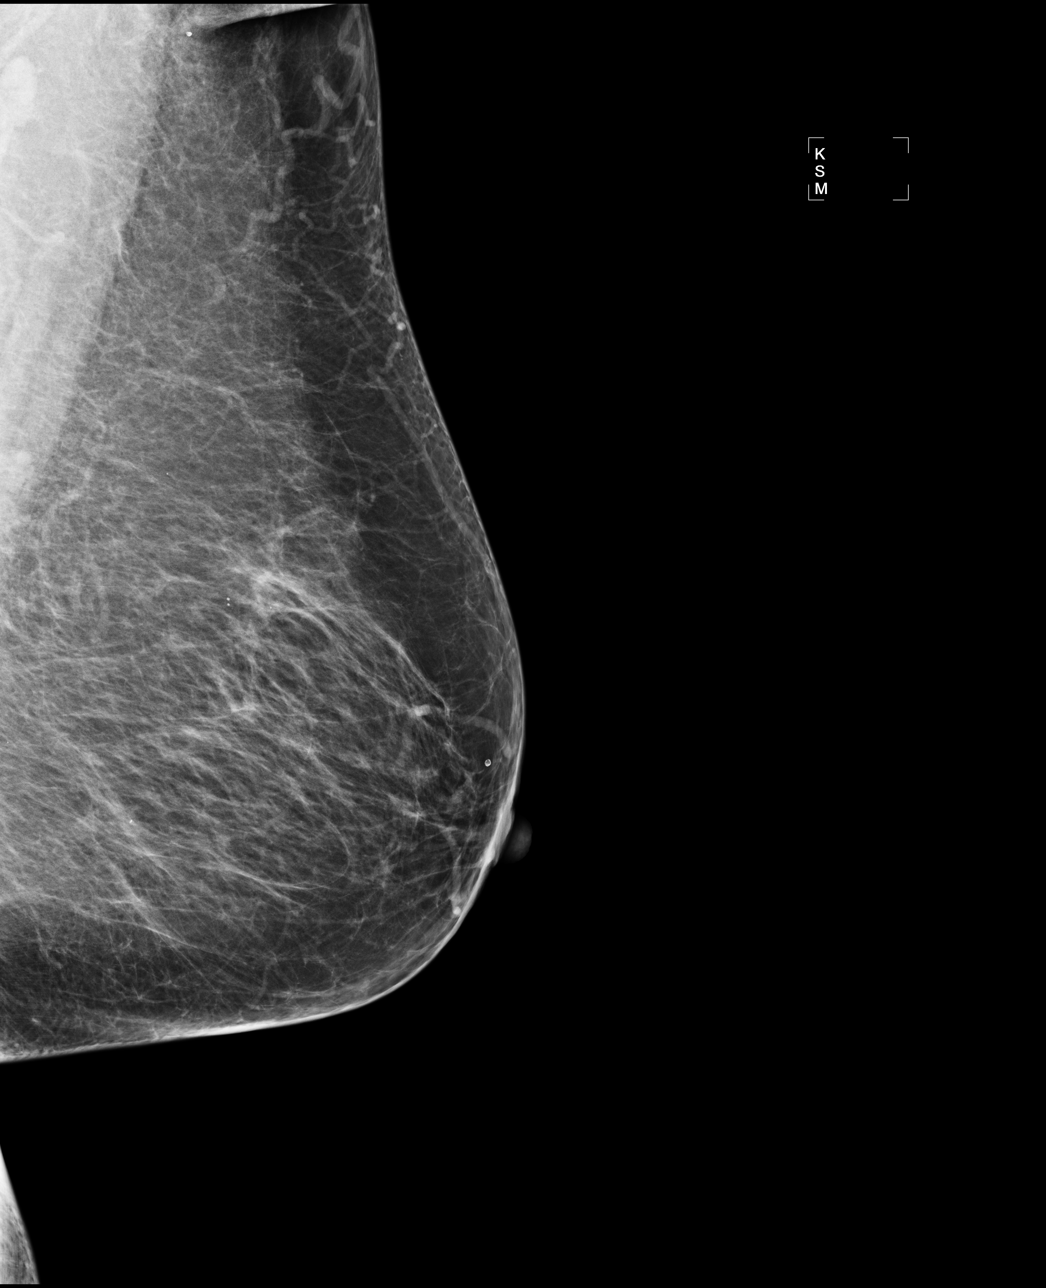

[R MLO]
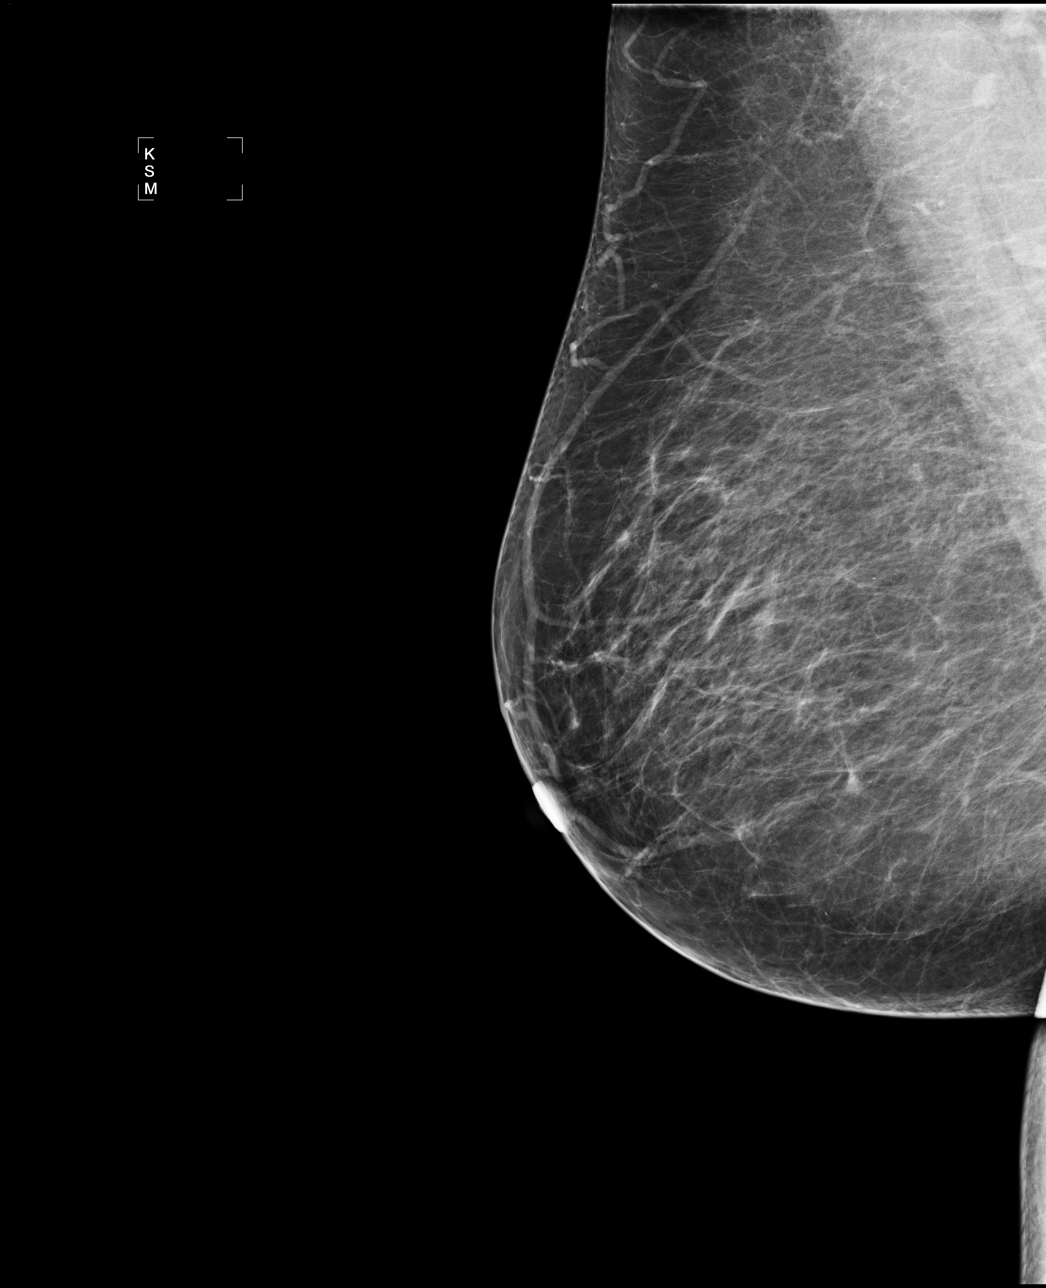

[4 of 4 positions shown; findings below may reference images not displayed]

IMPRESSION: No specific mammographic evidence of malignancy.  Next screening mammogram is recommended in one 
year.

A result letter of this screening mammogram will be mailed directly to the patient.

ASSESSMENT: Negative - BI-RADS 1

Screening mammogram in 1 year.
,

## 2011-12-19 DIAGNOSIS — M1710 Unilateral primary osteoarthritis, unspecified knee: Secondary | ICD-10-CM | POA: Diagnosis not present

## 2011-12-19 DIAGNOSIS — I890 Lymphedema, not elsewhere classified: Secondary | ICD-10-CM | POA: Diagnosis not present

## 2011-12-19 DIAGNOSIS — M25569 Pain in unspecified knee: Secondary | ICD-10-CM | POA: Diagnosis not present

## 2012-01-09 DIAGNOSIS — R0609 Other forms of dyspnea: Secondary | ICD-10-CM | POA: Diagnosis not present

## 2012-01-09 DIAGNOSIS — R0989 Other specified symptoms and signs involving the circulatory and respiratory systems: Secondary | ICD-10-CM | POA: Diagnosis not present

## 2012-01-24 ENCOUNTER — Other Ambulatory Visit: Payer: Self-pay | Admitting: Geriatric Medicine

## 2012-01-24 DIAGNOSIS — E1142 Type 2 diabetes mellitus with diabetic polyneuropathy: Secondary | ICD-10-CM | POA: Diagnosis not present

## 2012-01-24 DIAGNOSIS — E7800 Pure hypercholesterolemia, unspecified: Secondary | ICD-10-CM | POA: Diagnosis not present

## 2012-01-24 DIAGNOSIS — I10 Essential (primary) hypertension: Secondary | ICD-10-CM | POA: Diagnosis not present

## 2012-01-24 DIAGNOSIS — E1149 Type 2 diabetes mellitus with other diabetic neurological complication: Secondary | ICD-10-CM | POA: Diagnosis not present

## 2012-01-24 DIAGNOSIS — J309 Allergic rhinitis, unspecified: Secondary | ICD-10-CM | POA: Diagnosis not present

## 2012-01-24 DIAGNOSIS — Z79899 Other long term (current) drug therapy: Secondary | ICD-10-CM | POA: Diagnosis not present

## 2012-01-28 ENCOUNTER — Ambulatory Visit
Admission: RE | Admit: 2012-01-28 | Discharge: 2012-01-28 | Disposition: A | Discharge status: Home / Self Care | Payer: Medicare Other | Source: Ambulatory Visit | Attending: Geriatric Medicine | Admitting: Geriatric Medicine

## 2012-01-28 DIAGNOSIS — J309 Allergic rhinitis, unspecified: Secondary | ICD-10-CM | POA: Diagnosis not present

## 2012-01-28 IMAGING — CT CT PARANASAL SINUSES LIMITED
1 series · 9 of 11 positions shown, 12 images · non-contrast
Comparison: None.

CLINICAL DATA: Congestion, allergic rhinitis

CT LIMITED SINUSES WITHOUT CONTRAST
TECHNIQUE: Multidetector CT images of the paranasal sinuses were
obtained in a single plane without contrast.

[Series 3: cor soft · axial · 0.35mm/px · z∈[+8,+88]mm · 9 of 11 slices shown, 12 images]
[im 2/11  brain]
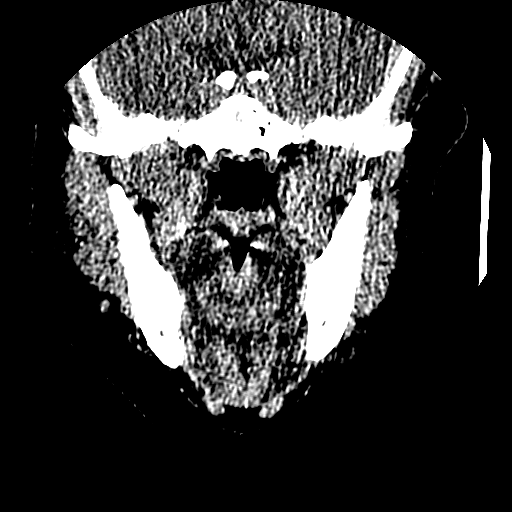
[im 2/11  bone]
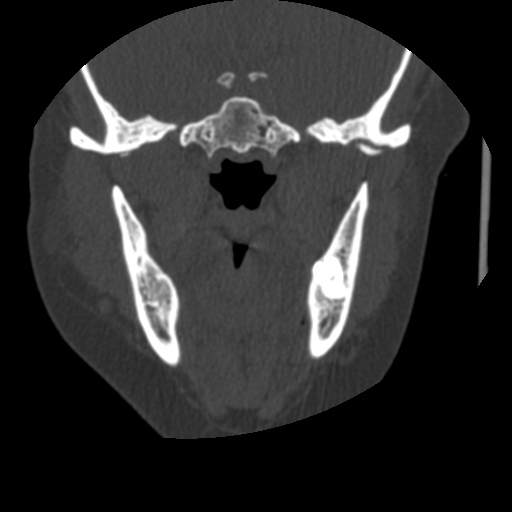
[im 3/11  bone]
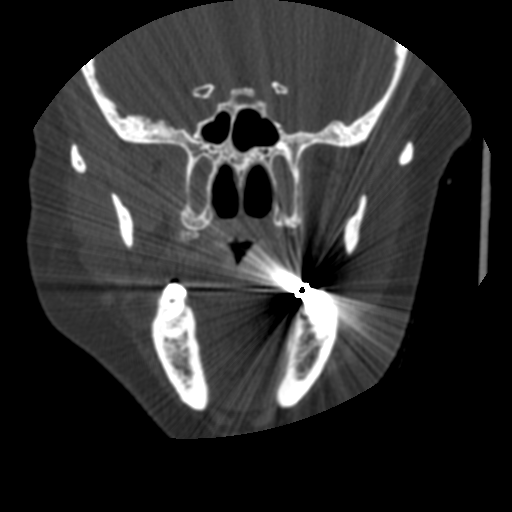
[im 4/11  bone]
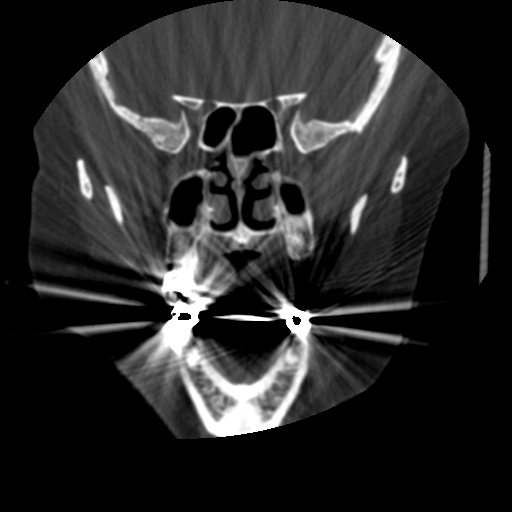
[im 5/11  bone]
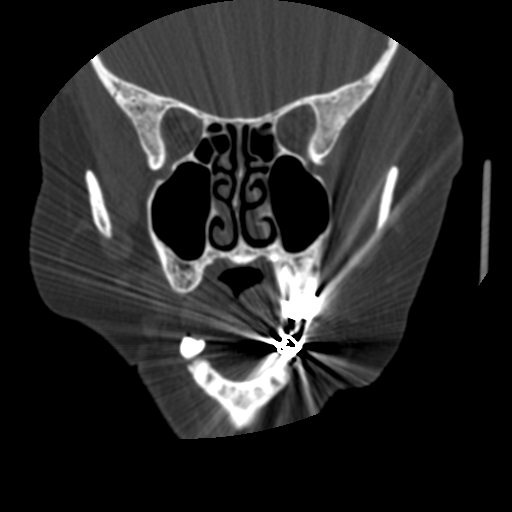
[im 6/11  brain]
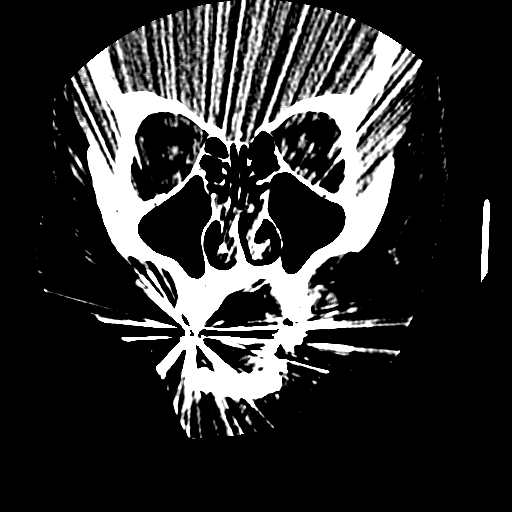
[im 6/11  bone]
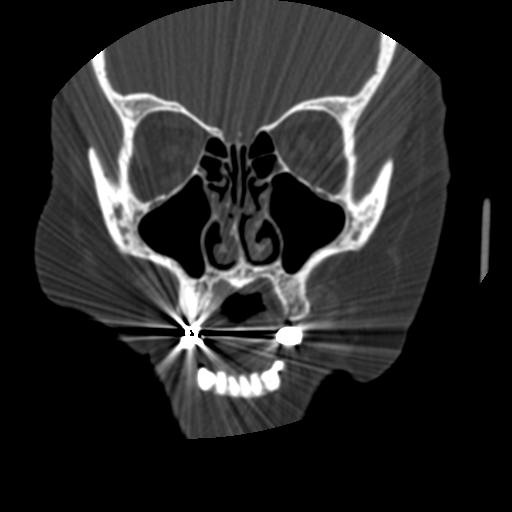
[im 7/11  bone]
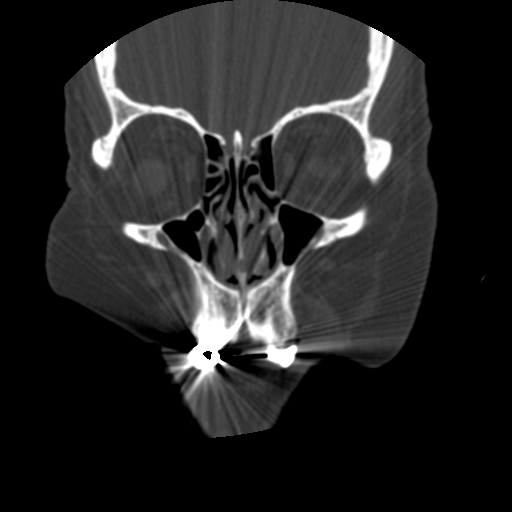
[im 8/11  bone]
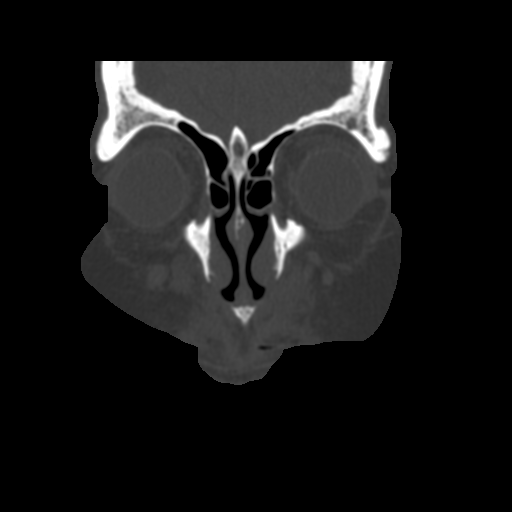
[im 9/11  bone]
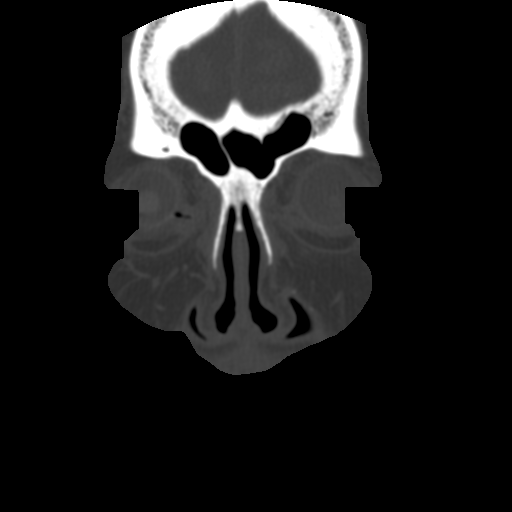
[im 10/11  brain]
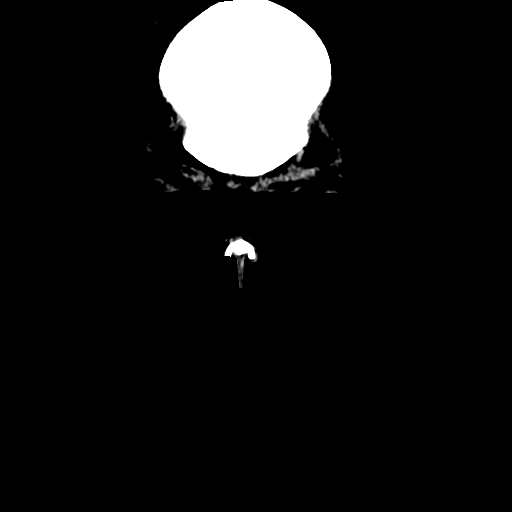
[im 10/11  bone]
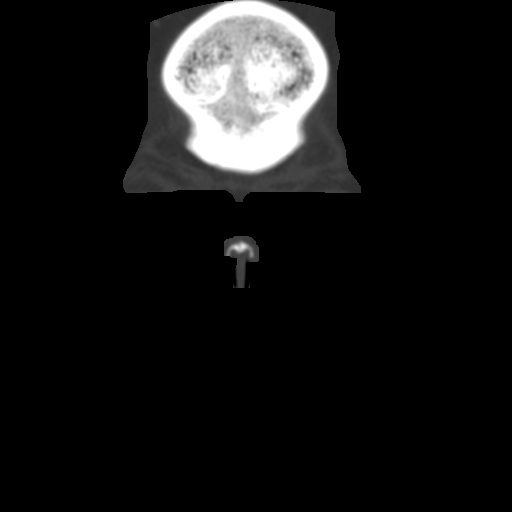

[9 of 11 positions shown; findings below may reference images not displayed]

FINDINGS: The paranasal sinuses are well pneumatized.  There is no
evidence of sinusitis.  The nasal turbinates are normal in size
although there is some nasal mucosal prominence suggesting edema
which may be due to rhinitis.  Very slight nasal septal deviation
to the left of midline is seen.  No bony abnormality is noted.
IMPRESSION: 1.  No sinusitis.
2.  There does appear to be some nasal mucosal edema slightly
narrowing the nasal airway.

## 2012-01-29 DIAGNOSIS — J329 Chronic sinusitis, unspecified: Secondary | ICD-10-CM | POA: Diagnosis not present

## 2012-03-05 DIAGNOSIS — I890 Lymphedema, not elsewhere classified: Secondary | ICD-10-CM | POA: Diagnosis not present

## 2012-03-09 DIAGNOSIS — I890 Lymphedema, not elsewhere classified: Secondary | ICD-10-CM | POA: Diagnosis not present

## 2012-03-16 DIAGNOSIS — I890 Lymphedema, not elsewhere classified: Secondary | ICD-10-CM | POA: Diagnosis not present

## 2012-03-18 DIAGNOSIS — I890 Lymphedema, not elsewhere classified: Secondary | ICD-10-CM | POA: Diagnosis not present

## 2012-03-20 DIAGNOSIS — I890 Lymphedema, not elsewhere classified: Secondary | ICD-10-CM | POA: Diagnosis not present

## 2012-03-23 DIAGNOSIS — I890 Lymphedema, not elsewhere classified: Secondary | ICD-10-CM | POA: Diagnosis not present

## 2012-03-25 DIAGNOSIS — I890 Lymphedema, not elsewhere classified: Secondary | ICD-10-CM | POA: Diagnosis not present

## 2012-03-27 DIAGNOSIS — I890 Lymphedema, not elsewhere classified: Secondary | ICD-10-CM | POA: Diagnosis not present

## 2012-03-30 DIAGNOSIS — I890 Lymphedema, not elsewhere classified: Secondary | ICD-10-CM | POA: Diagnosis not present

## 2012-04-01 DIAGNOSIS — I890 Lymphedema, not elsewhere classified: Secondary | ICD-10-CM | POA: Diagnosis not present

## 2012-04-03 DIAGNOSIS — I890 Lymphedema, not elsewhere classified: Secondary | ICD-10-CM | POA: Diagnosis not present

## 2012-04-06 DIAGNOSIS — I890 Lymphedema, not elsewhere classified: Secondary | ICD-10-CM | POA: Diagnosis not present

## 2012-04-08 DIAGNOSIS — I890 Lymphedema, not elsewhere classified: Secondary | ICD-10-CM | POA: Diagnosis not present

## 2012-04-15 DIAGNOSIS — R609 Edema, unspecified: Secondary | ICD-10-CM | POA: Diagnosis not present

## 2012-04-16 DIAGNOSIS — I890 Lymphedema, not elsewhere classified: Secondary | ICD-10-CM | POA: Diagnosis not present

## 2012-04-22 DIAGNOSIS — I890 Lymphedema, not elsewhere classified: Secondary | ICD-10-CM | POA: Diagnosis not present

## 2012-04-24 DIAGNOSIS — I890 Lymphedema, not elsewhere classified: Secondary | ICD-10-CM | POA: Diagnosis not present

## 2012-04-29 DIAGNOSIS — I890 Lymphedema, not elsewhere classified: Secondary | ICD-10-CM | POA: Diagnosis not present

## 2012-05-04 DIAGNOSIS — I890 Lymphedema, not elsewhere classified: Secondary | ICD-10-CM | POA: Diagnosis not present

## 2012-05-06 DIAGNOSIS — I890 Lymphedema, not elsewhere classified: Secondary | ICD-10-CM | POA: Diagnosis not present

## 2012-05-11 DIAGNOSIS — I890 Lymphedema, not elsewhere classified: Secondary | ICD-10-CM | POA: Diagnosis not present

## 2012-05-29 DIAGNOSIS — I10 Essential (primary) hypertension: Secondary | ICD-10-CM | POA: Diagnosis not present

## 2012-05-29 DIAGNOSIS — N951 Menopausal and female climacteric states: Secondary | ICD-10-CM | POA: Diagnosis not present

## 2012-05-29 DIAGNOSIS — D7589 Other specified diseases of blood and blood-forming organs: Secondary | ICD-10-CM | POA: Diagnosis not present

## 2012-05-29 DIAGNOSIS — E119 Type 2 diabetes mellitus without complications: Secondary | ICD-10-CM | POA: Diagnosis not present

## 2012-05-29 DIAGNOSIS — Z79899 Other long term (current) drug therapy: Secondary | ICD-10-CM | POA: Diagnosis not present

## 2012-07-23 DIAGNOSIS — E119 Type 2 diabetes mellitus without complications: Secondary | ICD-10-CM | POA: Diagnosis not present

## 2012-07-23 DIAGNOSIS — I10 Essential (primary) hypertension: Secondary | ICD-10-CM | POA: Diagnosis not present

## 2012-08-04 DIAGNOSIS — Z780 Asymptomatic menopausal state: Secondary | ICD-10-CM | POA: Diagnosis not present

## 2012-09-15 DIAGNOSIS — Z23 Encounter for immunization: Secondary | ICD-10-CM | POA: Diagnosis not present

## 2012-10-05 DIAGNOSIS — Z1331 Encounter for screening for depression: Secondary | ICD-10-CM | POA: Diagnosis not present

## 2012-10-05 DIAGNOSIS — E119 Type 2 diabetes mellitus without complications: Secondary | ICD-10-CM | POA: Diagnosis not present

## 2012-10-05 DIAGNOSIS — Z0000 Encounter for general adult medical examination without abnormal findings: Secondary | ICD-10-CM | POA: Diagnosis not present

## 2012-12-21 DIAGNOSIS — I10 Essential (primary) hypertension: Secondary | ICD-10-CM | POA: Diagnosis not present

## 2012-12-21 DIAGNOSIS — M6530 Trigger finger, unspecified finger: Secondary | ICD-10-CM | POA: Diagnosis not present

## 2012-12-21 DIAGNOSIS — E119 Type 2 diabetes mellitus without complications: Secondary | ICD-10-CM | POA: Diagnosis not present

## 2012-12-21 DIAGNOSIS — E7800 Pure hypercholesterolemia, unspecified: Secondary | ICD-10-CM | POA: Diagnosis not present

## 2012-12-21 DIAGNOSIS — Z79899 Other long term (current) drug therapy: Secondary | ICD-10-CM | POA: Diagnosis not present

## 2012-12-25 DIAGNOSIS — M6530 Trigger finger, unspecified finger: Secondary | ICD-10-CM | POA: Diagnosis not present

## 2013-02-01 ENCOUNTER — Encounter (HOSPITAL_BASED_OUTPATIENT_CLINIC_OR_DEPARTMENT_OTHER): Payer: Self-pay | Admitting: *Deleted

## 2013-02-01 ENCOUNTER — Other Ambulatory Visit: Payer: Self-pay | Admitting: Orthopedic Surgery

## 2013-02-01 NOTE — Progress Notes (Signed)
States labs 2 weeks ago dr Pete Glatter and ekg in 1 yr-will call and get records To bring cpap and use post op

## 2013-02-01 NOTE — Progress Notes (Signed)
Dr fitzgerald said labs 12/21/12 were ok-did want an ekg-pt to ome in for that

## 2013-02-03 ENCOUNTER — Encounter (HOSPITAL_BASED_OUTPATIENT_CLINIC_OR_DEPARTMENT_OTHER)
Admission: RE | Admit: 2013-02-03 | Discharge: 2013-02-03 | Disposition: A | Discharge status: Home / Self Care | Payer: Medicare Other | Source: Ambulatory Visit | Attending: Orthopedic Surgery | Admitting: Orthopedic Surgery

## 2013-02-03 DIAGNOSIS — G589 Mononeuropathy, unspecified: Secondary | ICD-10-CM | POA: Diagnosis not present

## 2013-02-03 DIAGNOSIS — F319 Bipolar disorder, unspecified: Secondary | ICD-10-CM | POA: Diagnosis not present

## 2013-02-03 DIAGNOSIS — I890 Lymphedema, not elsewhere classified: Secondary | ICD-10-CM | POA: Diagnosis not present

## 2013-02-03 DIAGNOSIS — I129 Hypertensive chronic kidney disease with stage 1 through stage 4 chronic kidney disease, or unspecified chronic kidney disease: Secondary | ICD-10-CM | POA: Diagnosis not present

## 2013-02-03 DIAGNOSIS — M6530 Trigger finger, unspecified finger: Secondary | ICD-10-CM | POA: Diagnosis not present

## 2013-02-03 DIAGNOSIS — E119 Type 2 diabetes mellitus without complications: Secondary | ICD-10-CM | POA: Diagnosis not present

## 2013-02-03 DIAGNOSIS — N183 Chronic kidney disease, stage 3 unspecified: Secondary | ICD-10-CM | POA: Diagnosis not present

## 2013-02-03 DIAGNOSIS — Z87891 Personal history of nicotine dependence: Secondary | ICD-10-CM | POA: Diagnosis not present

## 2013-02-03 DIAGNOSIS — G4733 Obstructive sleep apnea (adult) (pediatric): Secondary | ICD-10-CM | POA: Diagnosis not present

## 2013-02-03 DIAGNOSIS — Z79899 Other long term (current) drug therapy: Secondary | ICD-10-CM | POA: Diagnosis not present

## 2013-02-03 DIAGNOSIS — E785 Hyperlipidemia, unspecified: Secondary | ICD-10-CM | POA: Diagnosis not present

## 2013-02-03 DIAGNOSIS — M1990 Unspecified osteoarthritis, unspecified site: Secondary | ICD-10-CM | POA: Diagnosis not present

## 2013-02-03 DIAGNOSIS — J309 Allergic rhinitis, unspecified: Secondary | ICD-10-CM | POA: Diagnosis not present

## 2013-02-04 ENCOUNTER — Encounter (HOSPITAL_BASED_OUTPATIENT_CLINIC_OR_DEPARTMENT_OTHER): Payer: Self-pay | Admitting: Anesthesiology

## 2013-02-04 ENCOUNTER — Ambulatory Visit (HOSPITAL_BASED_OUTPATIENT_CLINIC_OR_DEPARTMENT_OTHER)
Admission: RE | Admit: 2013-02-04 | Discharge: 2013-02-04 | Disposition: A | Discharge status: Home / Self Care | Payer: Medicare Other | Source: Ambulatory Visit | Attending: Orthopedic Surgery | Admitting: Orthopedic Surgery

## 2013-02-04 ENCOUNTER — Encounter (HOSPITAL_BASED_OUTPATIENT_CLINIC_OR_DEPARTMENT_OTHER)
Admission: RE | Disposition: A | Discharge status: Home / Self Care | Payer: Self-pay | Source: Ambulatory Visit | Attending: Orthopedic Surgery

## 2013-02-04 ENCOUNTER — Ambulatory Visit (HOSPITAL_BASED_OUTPATIENT_CLINIC_OR_DEPARTMENT_OTHER): Payer: Medicare Other | Admitting: Anesthesiology

## 2013-02-04 ENCOUNTER — Encounter (HOSPITAL_BASED_OUTPATIENT_CLINIC_OR_DEPARTMENT_OTHER): Payer: Self-pay | Admitting: *Deleted

## 2013-02-04 DIAGNOSIS — G589 Mononeuropathy, unspecified: Secondary | ICD-10-CM | POA: Insufficient documentation

## 2013-02-04 DIAGNOSIS — E119 Type 2 diabetes mellitus without complications: Secondary | ICD-10-CM | POA: Insufficient documentation

## 2013-02-04 DIAGNOSIS — M6530 Trigger finger, unspecified finger: Secondary | ICD-10-CM | POA: Insufficient documentation

## 2013-02-04 DIAGNOSIS — I129 Hypertensive chronic kidney disease with stage 1 through stage 4 chronic kidney disease, or unspecified chronic kidney disease: Secondary | ICD-10-CM | POA: Insufficient documentation

## 2013-02-04 DIAGNOSIS — I890 Lymphedema, not elsewhere classified: Secondary | ICD-10-CM | POA: Insufficient documentation

## 2013-02-04 DIAGNOSIS — Z79899 Other long term (current) drug therapy: Secondary | ICD-10-CM | POA: Insufficient documentation

## 2013-02-04 DIAGNOSIS — G4733 Obstructive sleep apnea (adult) (pediatric): Secondary | ICD-10-CM | POA: Insufficient documentation

## 2013-02-04 DIAGNOSIS — Z87891 Personal history of nicotine dependence: Secondary | ICD-10-CM | POA: Insufficient documentation

## 2013-02-04 DIAGNOSIS — N183 Chronic kidney disease, stage 3 unspecified: Secondary | ICD-10-CM | POA: Insufficient documentation

## 2013-02-04 DIAGNOSIS — F319 Bipolar disorder, unspecified: Secondary | ICD-10-CM | POA: Insufficient documentation

## 2013-02-04 DIAGNOSIS — M1990 Unspecified osteoarthritis, unspecified site: Secondary | ICD-10-CM | POA: Insufficient documentation

## 2013-02-04 DIAGNOSIS — J309 Allergic rhinitis, unspecified: Secondary | ICD-10-CM | POA: Insufficient documentation

## 2013-02-04 DIAGNOSIS — E785 Hyperlipidemia, unspecified: Secondary | ICD-10-CM | POA: Insufficient documentation

## 2013-02-04 HISTORY — DX: Adverse effect of unspecified anesthetic, initial encounter: T41.45XA

## 2013-02-04 HISTORY — DX: Other complications of anesthesia, initial encounter: T88.59XA

## 2013-02-04 HISTORY — PX: TRIGGER FINGER RELEASE: SHX641

## 2013-02-04 LAB — POCT HEMOGLOBIN-HEMACUE: Hemoglobin: 11.6 g/dL — ABNORMAL LOW (ref 12.0–15.0)

## 2013-02-04 LAB — GLUCOSE, CAPILLARY: Glucose-Capillary: 66 mg/dL — ABNORMAL LOW (ref 70–99)

## 2013-02-04 SURGERY — RELEASE, A1 PULLEY, FOR TRIGGER FINGER
Anesthesia: General | Site: Finger | Laterality: Left | Wound class: Clean

## 2013-02-04 MED ORDER — FENTANYL CITRATE 0.05 MG/ML IJ SOLN: 50.0000 ug | INTRAMUSCULAR | Status: DC | PRN

## 2013-02-04 MED ORDER — MIDAZOLAM HCL 5 MG/5ML IJ SOLN
INTRAMUSCULAR | Status: DC | PRN
  Administered 2013-02-04: 2 mg via INTRAVENOUS

## 2013-02-04 MED ORDER — ONDANSETRON HCL 4 MG/2ML IJ SOLN
INTRAMUSCULAR | Status: DC | PRN
  Administered 2013-02-04: 4 mg via INTRAVENOUS

## 2013-02-04 MED ORDER — PROPOFOL 10 MG/ML IV BOLUS
INTRAVENOUS | Status: DC | PRN
  Administered 2013-02-04: 160 mg via INTRAVENOUS

## 2013-02-04 MED ORDER — CHLORHEXIDINE GLUCONATE 4 % EX LIQD: 60.0000 mL | Freq: Once | CUTANEOUS | Status: DC

## 2013-02-04 MED ORDER — FENTANYL CITRATE 0.05 MG/ML IJ SOLN
INTRAMUSCULAR | Status: DC | PRN
  Administered 2013-02-04: 100 ug via INTRAVENOUS

## 2013-02-04 MED ORDER — DEXAMETHASONE SODIUM PHOSPHATE 10 MG/ML IJ SOLN
INTRAMUSCULAR | Status: DC | PRN
  Administered 2013-02-04: 10 mg via INTRAVENOUS

## 2013-02-04 MED ORDER — CEFAZOLIN SODIUM-DEXTROSE 2-3 GM-% IV SOLR
2.0000 g | INTRAVENOUS | Status: AC
  Administered 2013-02-04: 2 g via INTRAVENOUS

## 2013-02-04 MED ORDER — LIDOCAINE HCL (CARDIAC) 20 MG/ML IV SOLN
INTRAVENOUS | Status: DC | PRN
  Administered 2013-02-04: 80 mg via INTRAVENOUS

## 2013-02-04 MED ORDER — MIDAZOLAM HCL 2 MG/2ML IJ SOLN: 1.0000 mg | INTRAMUSCULAR | Status: DC | PRN

## 2013-02-04 MED ORDER — LACTATED RINGERS IV SOLN
INTRAVENOUS | Status: DC
  Administered 2013-02-04: 09:00:00 via INTRAVENOUS

## 2013-02-04 MED ORDER — BUPIVACAINE HCL (PF) 0.25 % IJ SOLN
INTRAMUSCULAR | Status: DC | PRN
  Administered 2013-02-04: 5 mL

## 2013-02-04 MED ORDER — HYDROCODONE-ACETAMINOPHEN 5-325 MG PO TABS: ORAL_TABLET | ORAL | Status: DC

## 2013-02-04 SURGICAL SUPPLY — 34 items
BANDAGE COBAN STERILE 2 (GAUZE/BANDAGES/DRESSINGS) ×2 IMPLANT
BANDAGE CONFORM 2  STR LF (GAUZE/BANDAGES/DRESSINGS) ×2 IMPLANT
BLADE MINI RND TIP GREEN BEAV (BLADE) IMPLANT
BLADE SURG 15 STRL LF DISP TIS (BLADE) ×1 IMPLANT
BLADE SURG 15 STRL SS (BLADE) ×1
BNDG ELASTIC 2 VLCR STRL LF (GAUZE/BANDAGES/DRESSINGS) IMPLANT
BNDG ESMARK 4X9 LF (GAUZE/BANDAGES/DRESSINGS) ×2 IMPLANT
CHLORAPREP W/TINT 26ML (MISCELLANEOUS) ×2 IMPLANT
CLOTH BEACON ORANGE TIMEOUT ST (SAFETY) ×2 IMPLANT
CORDS BIPOLAR (ELECTRODE) ×2 IMPLANT
COVER MAYO STAND STRL (DRAPES) ×2 IMPLANT
COVER TABLE BACK 60X90 (DRAPES) ×2 IMPLANT
CUFF TOURNIQUET SINGLE 18IN (TOURNIQUET CUFF) ×2 IMPLANT
DRAPE EXTREMITY T 121X128X90 (DRAPE) ×2 IMPLANT
DRAPE SURG 17X23 STRL (DRAPES) ×2 IMPLANT
GAUZE XEROFORM 1X8 LF (GAUZE/BANDAGES/DRESSINGS) ×2 IMPLANT
GLOVE BIO SURGEON STRL SZ7.5 (GLOVE) ×2 IMPLANT
GLOVE BIOGEL M STRL SZ7.5 (GLOVE) ×2 IMPLANT
GLOVE BIOGEL PI IND STRL 8 (GLOVE) ×1 IMPLANT
GLOVE BIOGEL PI INDICATOR 8 (GLOVE) ×1
GOWN BRE IMP PREV XXLGXLNG (GOWN DISPOSABLE) ×2 IMPLANT
GOWN PREVENTION PLUS XLARGE (GOWN DISPOSABLE) ×2 IMPLANT
NEEDLE HYPO 25X1 1.5 SAFETY (NEEDLE) ×2 IMPLANT
NS IRRIG 1000ML POUR BTL (IV SOLUTION) ×2 IMPLANT
PACK BASIN DAY SURGERY FS (CUSTOM PROCEDURE TRAY) ×2 IMPLANT
PADDING CAST ABS 4INX4YD NS (CAST SUPPLIES) ×1
PADDING CAST ABS COTTON 4X4 ST (CAST SUPPLIES) ×1 IMPLANT
SPONGE GAUZE 4X4 12PLY (GAUZE/BANDAGES/DRESSINGS) ×2 IMPLANT
STOCKINETTE 4X48 STRL (DRAPES) ×2 IMPLANT
SUT ETHILON 4 0 PS 2 18 (SUTURE) ×2 IMPLANT
SYR BULB 3OZ (MISCELLANEOUS) ×2 IMPLANT
SYR CONTROL 10ML LL (SYRINGE) ×2 IMPLANT
TOWEL OR 17X24 6PK STRL BLUE (TOWEL DISPOSABLE) ×4 IMPLANT
UNDERPAD 30X30 INCONTINENT (UNDERPADS AND DIAPERS) ×2 IMPLANT

## 2013-02-04 NOTE — H&P (Signed)
[name] [name] is an 67 y.o. female.   Chief Complaint: left ring trigger digit HPI: 67 yo rhd female with triggering of left ring finger for 10 months.  Has had injection without lasting relief.  She would like a trigger release.  Past Medical History  Diagnosis Date  . HTN (hypertension)   . DM (diabetes mellitus)   . HLD (hyperlipidemia)   . Bipolar disorder   . Neuropathy   . Edema   . Lymphedema     tarda  . Cholelithiasis     seen on CAT 01/04/09  . Renal cyst     2.7 cm seen on CAT 01/04/09  . Osteoarthritis   . Iron deficiency anemia     3/12   . Chronic renal disease, stage III     3/12  . Obstructive sleep apnea on CPAP   . Allergic rhinitis   . Complication of anesthesia     versed and fentanyl did not work for colonoscopy    Past Surgical History  Procedure Laterality Date  . Abdominal hysterectomy    . Reimplant ureter in bladder  1996  . Colonoscopy  2009; 07/09/11    2009:normal (polyp was not adenoma); 2012:   . Esophagogastroduodenoscopy  07/09/11    Erosive gastritis  . Givens capsule study  07/25/2011    normal small bowel  . Dilation and curettage of uterus    . Fracture surgery      rt fx upper arm    Family History  Problem Relation Age of Onset  . Diabetes Mother   . Hyperlipidemia Mother   . Hypertension Mother   . Heart disease Father   . Colon cancer Neg Hx    Social History:  reports that she quit smoking about 22 years ago. Her smoking use included Cigarettes. She smoked 0.00 packs per day. She does not have any smokeless tobacco history on file. She reports that  drinks alcohol. She reports that she does not use illicit drugs.  Allergies: No Known Allergies  Medications Prior to Admission  Medication Sig Dispense Refill  . ALPRAZolam (XANAX) 0.5 MG tablet Take 0.5 mg by mouth 3 (three) times daily as needed.        . ferrous sulfate 325 (65 FE) MG tablet Take 325 mg by mouth daily with breakfast.        . lamoTRIgine (LAMICTAL) 200  MG tablet Take 200 mg by mouth daily.        Marland Kitchen losartan-hydrochlorothiazide (HYZAAR) 100-12.5 MG per tablet Take 1 tablet by mouth daily.        . metFORMIN (GLUCOPHAGE) 1000 MG tablet Take 1,000 mg by mouth 2 (two) times daily with a meal.       . QUEtiapine (SEROQUEL) 100 MG tablet Take 100 mg by mouth at bedtime.        . ziprasidone (GEODON) 20 MG capsule Take 20 mg by mouth 2 (two) times daily with a meal.          Results for orders placed during the hospital encounter of 02/04/13 (from the past 48 hour(s))  GLUCOSE, CAPILLARY     Status: None   Collection Time    02/04/13  8:55 AM      Result Value Range   Glucose-Capillary 75  70 - 99 mg/dL  POCT HEMOGLOBIN-HEMACUE     Status: Abnormal   Collection Time    02/04/13  8:57 AM      Result Value Range  Hemoglobin 11.6 (*) 12.0 - 15.0 g/dL    No results found.   A comprehensive review of systems was negative except for: Constitutional: positive for weight loss Eyes: positive for contacts/glasses Hematologic/lymphatic: positive for easy bruising Neurological: positive for headaches Behavioral/Psych: positive for depression and sleep disturbance  Blood pressure 146/79, pulse 67, temperature 97.6 F (36.4 C), temperature source Oral, resp. rate 18, height 5\' 5"  (1.651 m), weight 80.967 kg (178 lb 8 oz), SpO2 98.00%.  General appearance: alert, cooperative and appears stated age Head: Normocephalic, without obvious abnormality, atraumatic Neck: supple, symmetrical, trachea midline Resp: clear to auscultation bilaterally Cardio: regular rate and rhythm GI: non tender Extremities: intact sensation and capillary refill all digits.  +epl/fpl/io.  triggering of left ring finger. Pulses: 2+ and symmetric Skin: Skin color, texture, turgor normal. No rashes or lesions Neurologic: Grossly normal Incision/Wound: na  Assessment/Plan Left ring finger trigger digit.  She wishes to have this released.  Non operative and operative  treatment options were discussed with the patient and patient wishes to proceed with operative treatment. Risks, benefits, and alternatives of surgery were discussed and the patient agrees with the plan of care.   Cyniah Gossard R 02/04/2013, 9:25 AM

## 2013-02-04 NOTE — Transfer of Care (Signed)
Immediate Anesthesia Transfer of Care Note  Patient: [name] [name]  Procedure(s) Performed: Procedure(s): RELEASE TRIGGER FINGER/A-1 PULLEY LEFT RING FINGER (Left)  Patient Location: PACU  Anesthesia Type:General  Level of Consciousness: sedated  Airway & Oxygen Therapy: Patient Spontanous Breathing and Patient connected to face mask oxygen  Post-op Assessment: Report given to PACU RN and Post -op Vital signs reviewed and stable  Post vital signs: Reviewed and stable  Complications: No apparent anesthesia complications

## 2013-02-04 NOTE — Anesthesia Procedure Notes (Signed)
Procedure Name: LMA Insertion Date/Time: 02/04/2013 10:41 AM Performed by: Burna Cash Pre-anesthesia Checklist: Patient identified, Emergency Drugs available, Suction available and Patient being monitored Patient Re-evaluated:Patient Re-evaluated prior to inductionOxygen Delivery Method: Circle System Utilized Preoxygenation: Pre-oxygenation with 100% oxygen Intubation Type: IV induction Ventilation: Mask ventilation without difficulty LMA: LMA inserted LMA Size: 4.0 Number of attempts: 1 Airway Equipment and Method: bite block Placement Confirmation: positive ETCO2 Tube secured with: Tape Dental Injury: Teeth and Oropharynx as per pre-operative assessment

## 2013-02-04 NOTE — Anesthesia Preprocedure Evaluation (Addendum)
Anesthesia Evaluation  Patient identified by MRN, date of birth, ID band Patient awake    Reviewed: Allergy & Precautions, H&P , NPO status , Patient's Chart, lab work & pertinent test results  Airway Mallampati: II  Neck ROM: full    Dental   Pulmonary sleep apnea , former smoker,          Cardiovascular hypertension,     Neuro/Psych PSYCHIATRIC DISORDERS Depression Bipolar Disorder    GI/Hepatic   Endo/Other  diabetes, Type 2  Renal/GU      Musculoskeletal  (+) Arthritis -, Osteoarthritis,    Abdominal   Peds  Hematology   Anesthesia Other Findings   Reproductive/Obstetrics                          Anesthesia Physical Anesthesia Plan  ASA: III  Anesthesia Plan: General   Post-op Pain Management:    Induction: Intravenous  Airway Management Planned: LMA  Additional Equipment:   Intra-op Plan:   Post-operative Plan:   Informed Consent: I have reviewed the patients History and Physical, chart, labs and discussed the procedure including the risks, benefits and alternatives for the proposed anesthesia with the patient or authorized representative who has indicated his/her understanding and acceptance.     Plan Discussed with: CRNA and Surgeon  Anesthesia Plan Comments:         Anesthesia Quick Evaluation

## 2013-02-04 NOTE — Op Note (Signed)
[id number] 

## 2013-02-04 NOTE — Anesthesia Postprocedure Evaluation (Signed)
Anesthesia Post Note  Patient: [name] [name]  Procedure(s) Performed: Procedure(s) (LRB): RELEASE TRIGGER FINGER/A-1 PULLEY LEFT RING FINGER (Left)  Anesthesia type: General  Patient location: PACU  Post pain: Pain level controlled and Adequate analgesia  Post assessment: Post-op Vital signs reviewed, Patient's Cardiovascular Status Stable, Respiratory Function Stable, Patent Airway and Pain level controlled  Last Vitals:  Filed Vitals:   02/04/13 1145  BP: 176/83  Pulse: 59  Temp:   Resp: 14    Post vital signs: Reviewed and stable  Level of consciousness: awake, alert  and oriented  Complications: No apparent anesthesia complications

## 2013-02-04 NOTE — Brief Op Note (Signed)
02/04/2013  11:18 AM  PATIENT:  [name] [name]  66 y.o. female  PRE-OPERATIVE DIAGNOSIS:  CHRONIC STENOSING TENOSYNOVITIS LEFT RING FINGER  POST-OPERATIVE DIAGNOSIS:  CHRONIC STENOSING TENOSYNOVITIS LEFT RING FINGER  PROCEDURE:  Procedure(s): RELEASE TRIGGER FINGER/A-1 PULLEY LEFT RING FINGER (Left)  SURGEON:  Surgeon(s) and Role:    * Tami Ribas, MD - Primary  PHYSICIAN ASSISTANT:   ASSISTANTS: none   ANESTHESIA:   general  EBL:  Total I/O In: 200 [I.V.:200] Out: -   BLOOD ADMINISTERED:none  DRAINS: none   LOCAL MEDICATIONS USED:  MARCAINE     SPECIMEN:  No Specimen  DISPOSITION OF SPECIMEN:  N/A  COUNTS:  YES  TOURNIQUET:   Total Tourniquet Time Documented: Upper Arm (Left) - 17 minutes Total: Upper Arm (Left) - 17 minutes   DICTATION: .Other Dictation: Dictation Number [id number]  PLAN OF CARE: Discharge to home after PACU  PATIENT DISPOSITION:  PACU - hemodynamically stable.

## 2013-02-04 NOTE — Progress Notes (Signed)
Pt alert and oriented  Skin warm and dry. States blood sugar runs in low 70 at times. Ambulates well.  Ate gold fish prior to discharge and stopping to get food on way home.

## 2013-02-05 ENCOUNTER — Encounter (HOSPITAL_BASED_OUTPATIENT_CLINIC_OR_DEPARTMENT_OTHER): Payer: Self-pay | Admitting: Orthopedic Surgery

## 2013-02-05 NOTE — Op Note (Signed)
[name], [name] [name] NO.:  [id number]  MEDICAL RECORD NO.:  [id number]  LOCATION:                                 FACILITY:  PHYSICIAN:  Betha Loa, MD             DATE OF BIRTH:  DATE OF PROCEDURE:  02/04/2013 DATE OF DISCHARGE:                              OPERATIVE REPORT   PREOPERATIVE DIAGNOSIS:  Left ring finger trigger digit.  POSTOPERATIVE DIAGNOSIS:  Left ring finger trigger digit.  PROCEDURE:  Left ring finger trigger release.  SURGEON:  Betha Loa, MD  ASSISTANT:  None.  ANESTHESIA:  General.  IV FLUIDS:  Per anesthesia flow sheet.  ESTIMATED BLOOD LOSS:  Minimal.  COMPLICATIONS:  None.  SPECIMENS:  None.  TOURNIQUET TIME:  17 minutes.  DISPOSITION:  Stable to PACU.  INDICATIONS:  [name] is a 67 year old female with triggering of the left long finger.  She has had this injected and it recurred.  She wished to have a surgical release.  Risks, benefits, and  alternatives of surgery were discussed including risk of blood loss, infection, damage to nerves, vessels, tendons, ligaments, bone; failure of surgery; need for additional surgery, complications with wound healing, continued pain, continued triggering.  She voiced understanding of these risks and elected to proceed.  OPERATIVE COURSE:  After being identified preoperatively by myself, the patient and I agreed upon procedure and site of procedure.  Surgical site was marked.  The risks, benefits, and alternatives of surgery were reviewed and she wished to proceed.  She was given IV Ancef as preoperative antibiotic prophylaxis.  She was transferred to the operating room, placed on the operating room table in supine position, left upper extremity on arm board.  General anesthesia was induced by anesthesiologist.  Left upper extremity was prepped and draped in normal sterile orthopedic fashion.  Surgical pause was performed between surgeons, anesthesia, operating staff, and  all were in agreement as to the patient, procedure, and site of procedure.  Tourniquet at the proximal aspect of the extremity was inflated to 250 mmHg after exsanguination of the limb with an Esmarch bandage.  Incision was made at the volar aspect of the MP joint of the ring finger.  It was carried into subcutaneous tissues by spreading technique.  Care was taken to protect the radial and ulnar digital neurovascular bundles.  The A1 pulley was identified and was incised.  She had an A0 pulley which was also incised.  The proximal 2-3 mm of A2 pulley was incised as well to eliminate triggering.  The finger was placed through range of motion and tendon separated.  There was no recurrence of triggering.  The superficialis tendon did appear slightly degenerated, but did not need to be debrided.  The wound was copiously irrigated with sterile saline. It was closed with 4-0 nylon in a horizontal mattress fashion.  It was injected with 5 mL of 0.25% plain Marcaine to aid in postoperative analgesia.  It was then dressed with sterile Xeroform, 4x4s, and wrapped with Kling and Coban dressing lightly.  Tourniquet deflated 17 minutes. Fingertips were pink with brisk capillary refill after deflation of tourniquet.  Operative drapes were broken down.  The patient was awoken from anesthesia safely.  She was transferred back to stretcher and taken to PACU in stable condition.  I will see her back in office in 1 week for postoperative followup.  I will give her Norco 5/325, 1-2 p.o. q.6 hours p.r.n. pain, dispensed #30.     Betha Loa, MD     KK/MEDQ  D:  02/04/2013  T:  02/05/2013  Job:  [id number]

## 2013-03-08 DIAGNOSIS — I890 Lymphedema, not elsewhere classified: Secondary | ICD-10-CM | POA: Diagnosis not present

## 2013-03-08 DIAGNOSIS — I10 Essential (primary) hypertension: Secondary | ICD-10-CM | POA: Diagnosis not present

## 2013-03-08 DIAGNOSIS — G4730 Sleep apnea, unspecified: Secondary | ICD-10-CM | POA: Diagnosis not present

## 2013-03-30 DIAGNOSIS — G4733 Obstructive sleep apnea (adult) (pediatric): Secondary | ICD-10-CM | POA: Diagnosis not present

## 2013-04-26 DIAGNOSIS — F3162 Bipolar disorder, current episode mixed, moderate: Secondary | ICD-10-CM | POA: Diagnosis not present

## 2013-05-04 DIAGNOSIS — F3162 Bipolar disorder, current episode mixed, moderate: Secondary | ICD-10-CM | POA: Diagnosis not present

## 2013-05-12 DIAGNOSIS — F3162 Bipolar disorder, current episode mixed, moderate: Secondary | ICD-10-CM | POA: Diagnosis not present

## 2013-05-20 DIAGNOSIS — F3162 Bipolar disorder, current episode mixed, moderate: Secondary | ICD-10-CM | POA: Diagnosis not present

## 2013-05-25 DIAGNOSIS — G4730 Sleep apnea, unspecified: Secondary | ICD-10-CM | POA: Diagnosis not present

## 2013-05-26 DIAGNOSIS — F3162 Bipolar disorder, current episode mixed, moderate: Secondary | ICD-10-CM | POA: Diagnosis not present

## 2013-06-01 DIAGNOSIS — F3162 Bipolar disorder, current episode mixed, moderate: Secondary | ICD-10-CM | POA: Diagnosis not present

## 2013-06-09 DIAGNOSIS — F3162 Bipolar disorder, current episode mixed, moderate: Secondary | ICD-10-CM | POA: Diagnosis not present

## 2013-06-24 DIAGNOSIS — F3162 Bipolar disorder, current episode mixed, moderate: Secondary | ICD-10-CM | POA: Diagnosis not present

## 2013-07-06 DIAGNOSIS — E119 Type 2 diabetes mellitus without complications: Secondary | ICD-10-CM | POA: Diagnosis not present

## 2013-07-06 DIAGNOSIS — Z79899 Other long term (current) drug therapy: Secondary | ICD-10-CM | POA: Diagnosis not present

## 2013-07-06 DIAGNOSIS — E7800 Pure hypercholesterolemia, unspecified: Secondary | ICD-10-CM | POA: Diagnosis not present

## 2013-07-06 DIAGNOSIS — I10 Essential (primary) hypertension: Secondary | ICD-10-CM | POA: Diagnosis not present

## 2013-07-07 DIAGNOSIS — F3162 Bipolar disorder, current episode mixed, moderate: Secondary | ICD-10-CM | POA: Diagnosis not present

## 2013-07-14 DIAGNOSIS — F3162 Bipolar disorder, current episode mixed, moderate: Secondary | ICD-10-CM | POA: Diagnosis not present

## 2013-07-22 DIAGNOSIS — F3162 Bipolar disorder, current episode mixed, moderate: Secondary | ICD-10-CM | POA: Diagnosis not present

## 2013-07-27 DIAGNOSIS — F3162 Bipolar disorder, current episode mixed, moderate: Secondary | ICD-10-CM | POA: Diagnosis not present

## 2013-08-05 DIAGNOSIS — F3162 Bipolar disorder, current episode mixed, moderate: Secondary | ICD-10-CM | POA: Diagnosis not present

## 2013-08-05 DIAGNOSIS — N390 Urinary tract infection, site not specified: Secondary | ICD-10-CM | POA: Diagnosis not present

## 2013-08-20 DIAGNOSIS — F3162 Bipolar disorder, current episode mixed, moderate: Secondary | ICD-10-CM | POA: Diagnosis not present

## 2013-08-24 DIAGNOSIS — F3162 Bipolar disorder, current episode mixed, moderate: Secondary | ICD-10-CM | POA: Diagnosis not present

## 2013-08-26 DIAGNOSIS — N390 Urinary tract infection, site not specified: Secondary | ICD-10-CM | POA: Diagnosis not present

## 2013-09-03 DIAGNOSIS — F3162 Bipolar disorder, current episode mixed, moderate: Secondary | ICD-10-CM | POA: Diagnosis not present

## 2013-09-07 DIAGNOSIS — I10 Essential (primary) hypertension: Secondary | ICD-10-CM | POA: Diagnosis not present

## 2013-09-07 DIAGNOSIS — E119 Type 2 diabetes mellitus without complications: Secondary | ICD-10-CM | POA: Diagnosis not present

## 2013-09-07 DIAGNOSIS — Z23 Encounter for immunization: Secondary | ICD-10-CM | POA: Diagnosis not present

## 2013-09-08 DIAGNOSIS — F3162 Bipolar disorder, current episode mixed, moderate: Secondary | ICD-10-CM | POA: Diagnosis not present

## 2013-09-15 DIAGNOSIS — F3162 Bipolar disorder, current episode mixed, moderate: Secondary | ICD-10-CM | POA: Diagnosis not present

## 2013-09-21 DIAGNOSIS — F3162 Bipolar disorder, current episode mixed, moderate: Secondary | ICD-10-CM | POA: Diagnosis not present

## 2013-09-29 DIAGNOSIS — F3162 Bipolar disorder, current episode mixed, moderate: Secondary | ICD-10-CM | POA: Diagnosis not present

## 2013-10-11 DIAGNOSIS — F3162 Bipolar disorder, current episode mixed, moderate: Secondary | ICD-10-CM | POA: Diagnosis not present

## 2013-10-20 DIAGNOSIS — F3162 Bipolar disorder, current episode mixed, moderate: Secondary | ICD-10-CM | POA: Diagnosis not present

## 2013-10-29 DIAGNOSIS — F3162 Bipolar disorder, current episode mixed, moderate: Secondary | ICD-10-CM | POA: Diagnosis not present

## 2013-11-02 DIAGNOSIS — F3162 Bipolar disorder, current episode mixed, moderate: Secondary | ICD-10-CM | POA: Diagnosis not present

## 2013-11-30 DIAGNOSIS — F3162 Bipolar disorder, current episode mixed, moderate: Secondary | ICD-10-CM | POA: Diagnosis not present

## 2013-12-21 DIAGNOSIS — R233 Spontaneous ecchymoses: Secondary | ICD-10-CM | POA: Diagnosis not present

## 2013-12-21 DIAGNOSIS — E119 Type 2 diabetes mellitus without complications: Secondary | ICD-10-CM | POA: Diagnosis not present

## 2013-12-21 DIAGNOSIS — Z1331 Encounter for screening for depression: Secondary | ICD-10-CM | POA: Diagnosis not present

## 2013-12-21 DIAGNOSIS — E7800 Pure hypercholesterolemia, unspecified: Secondary | ICD-10-CM | POA: Diagnosis not present

## 2013-12-21 DIAGNOSIS — Z79899 Other long term (current) drug therapy: Secondary | ICD-10-CM | POA: Diagnosis not present

## 2013-12-21 DIAGNOSIS — Z0000 Encounter for general adult medical examination without abnormal findings: Secondary | ICD-10-CM | POA: Diagnosis not present

## 2013-12-21 DIAGNOSIS — D509 Iron deficiency anemia, unspecified: Secondary | ICD-10-CM | POA: Diagnosis not present

## 2014-01-04 DIAGNOSIS — Z1211 Encounter for screening for malignant neoplasm of colon: Secondary | ICD-10-CM | POA: Diagnosis not present

## 2014-02-01 DIAGNOSIS — D509 Iron deficiency anemia, unspecified: Secondary | ICD-10-CM | POA: Diagnosis not present

## 2014-03-10 DIAGNOSIS — D509 Iron deficiency anemia, unspecified: Secondary | ICD-10-CM | POA: Diagnosis not present

## 2014-04-07 DIAGNOSIS — F3162 Bipolar disorder, current episode mixed, moderate: Secondary | ICD-10-CM | POA: Diagnosis not present

## 2014-04-14 DIAGNOSIS — F3162 Bipolar disorder, current episode mixed, moderate: Secondary | ICD-10-CM | POA: Diagnosis not present

## 2014-04-21 DIAGNOSIS — F3162 Bipolar disorder, current episode mixed, moderate: Secondary | ICD-10-CM | POA: Diagnosis not present

## 2014-04-28 DIAGNOSIS — F3162 Bipolar disorder, current episode mixed, moderate: Secondary | ICD-10-CM | POA: Diagnosis not present

## 2014-05-10 DIAGNOSIS — F3162 Bipolar disorder, current episode mixed, moderate: Secondary | ICD-10-CM | POA: Diagnosis not present

## 2014-06-27 ENCOUNTER — Other Ambulatory Visit: Payer: Self-pay

## 2014-06-27 DIAGNOSIS — Z1231 Encounter for screening mammogram for malignant neoplasm of breast: Secondary | ICD-10-CM

## 2014-07-05 ENCOUNTER — Encounter: Payer: Self-pay | Admitting: Internal Medicine

## 2014-07-07 ENCOUNTER — Ambulatory Visit
Admission: RE | Admit: 2014-07-07 | Discharge: 2014-07-07 | Disposition: A | Discharge status: Home / Self Care | Payer: Medicare Other | Source: Ambulatory Visit

## 2014-07-07 DIAGNOSIS — Z1231 Encounter for screening mammogram for malignant neoplasm of breast: Secondary | ICD-10-CM | POA: Diagnosis not present

## 2014-07-07 IMAGING — MG MM SCREEN MAMMOGRAM BILATERAL
6 series · 6 of 6 positions shown · non-contrast
Comparison: Previous Exam(s)

CLINICAL DATA: Screening.

EXAM:
DIGITAL SCREENING BILATERAL MAMMOGRAM WITH CAD

[R CC (1 of 2)]
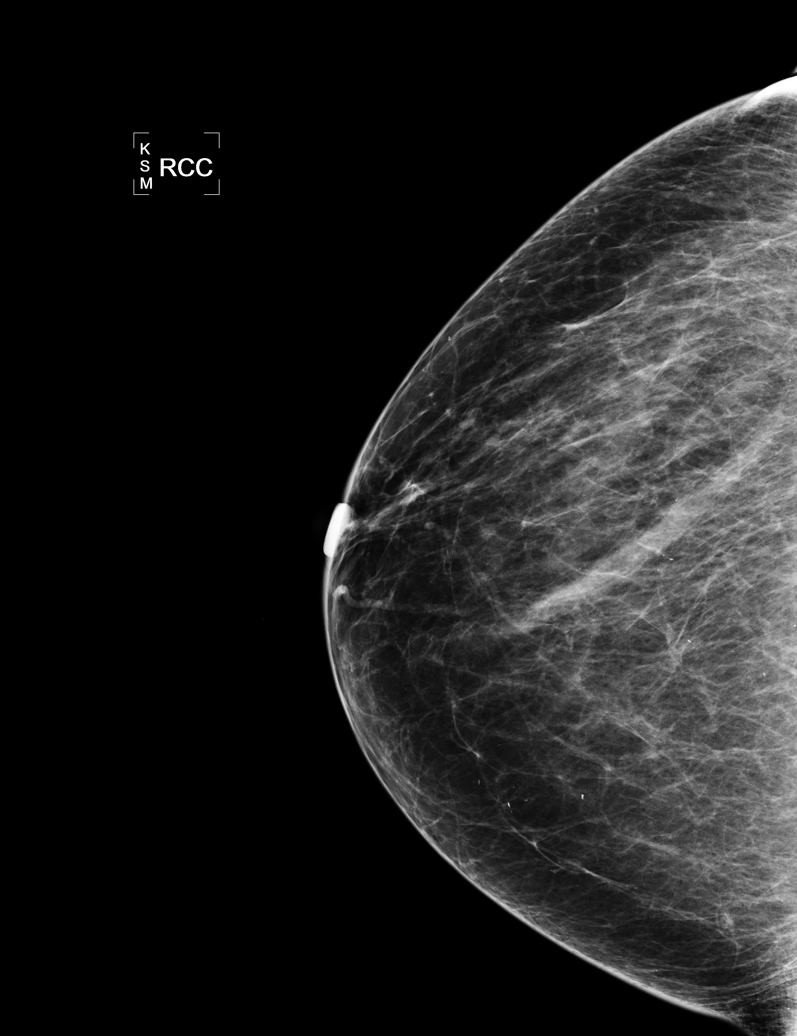

[L CC]
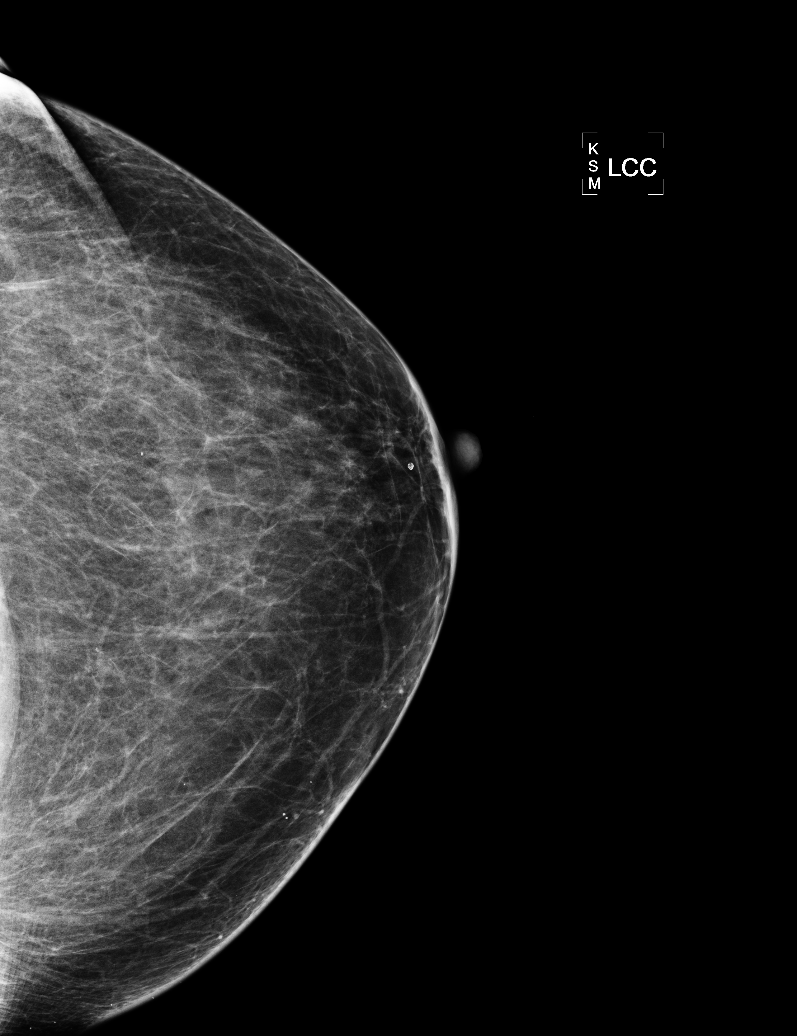

[L MLO (1 of 2)]
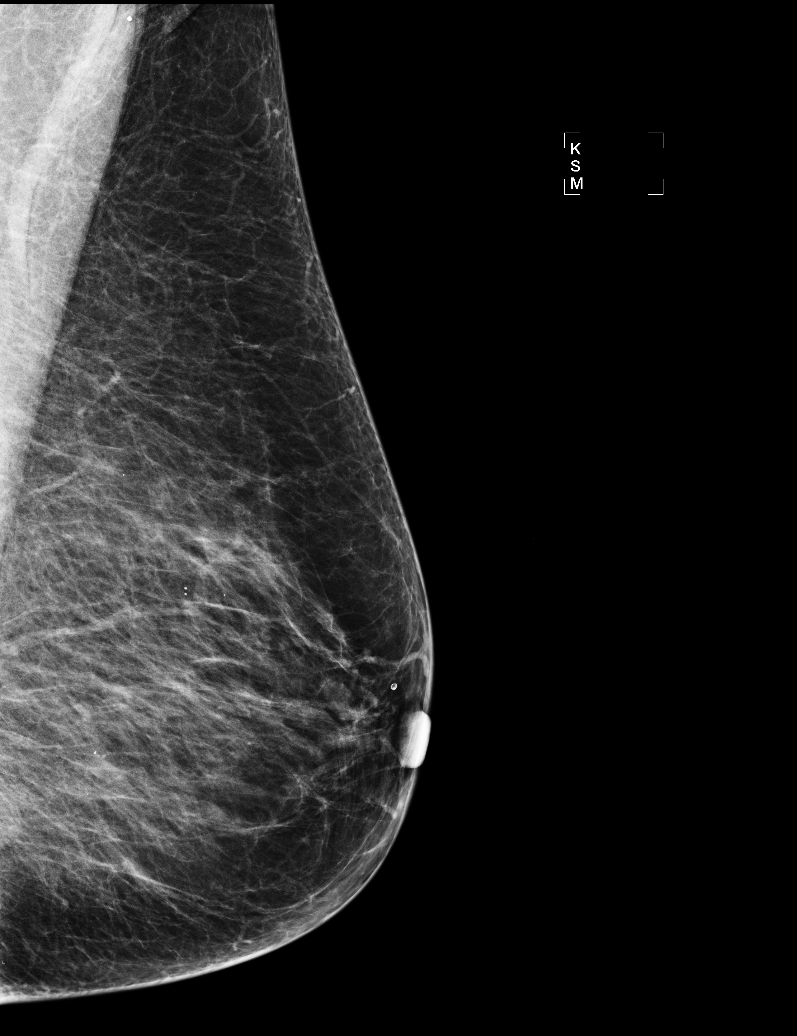

[R MLO]
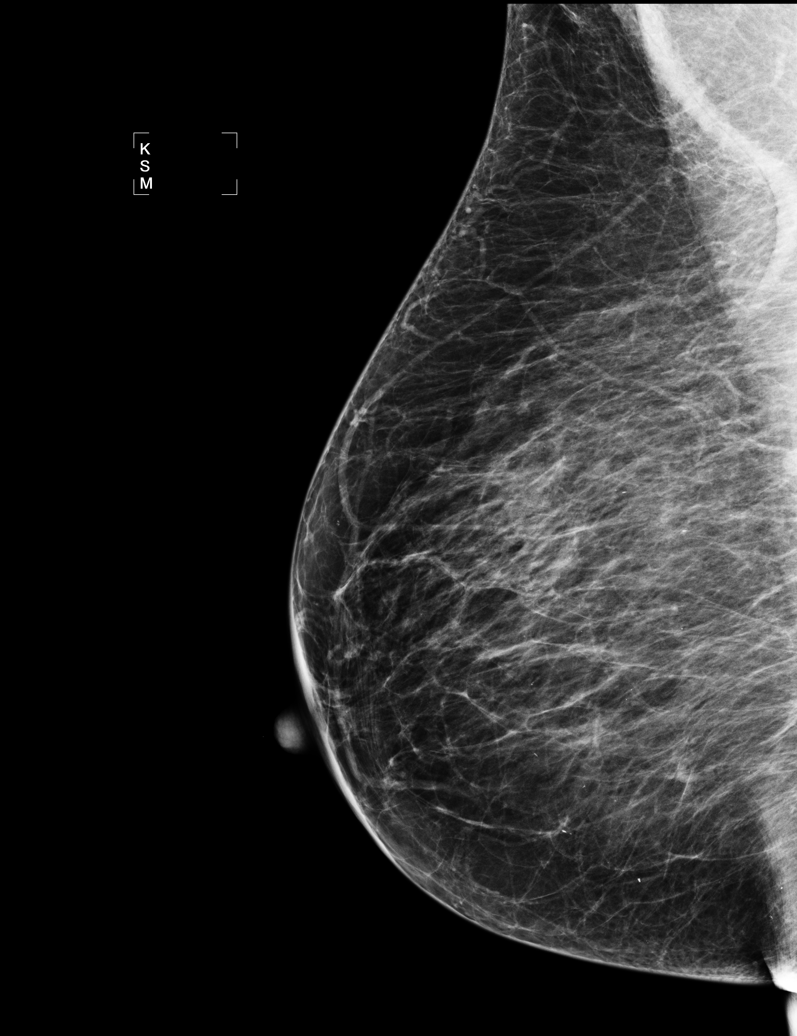

[R CC (2 of 2)]
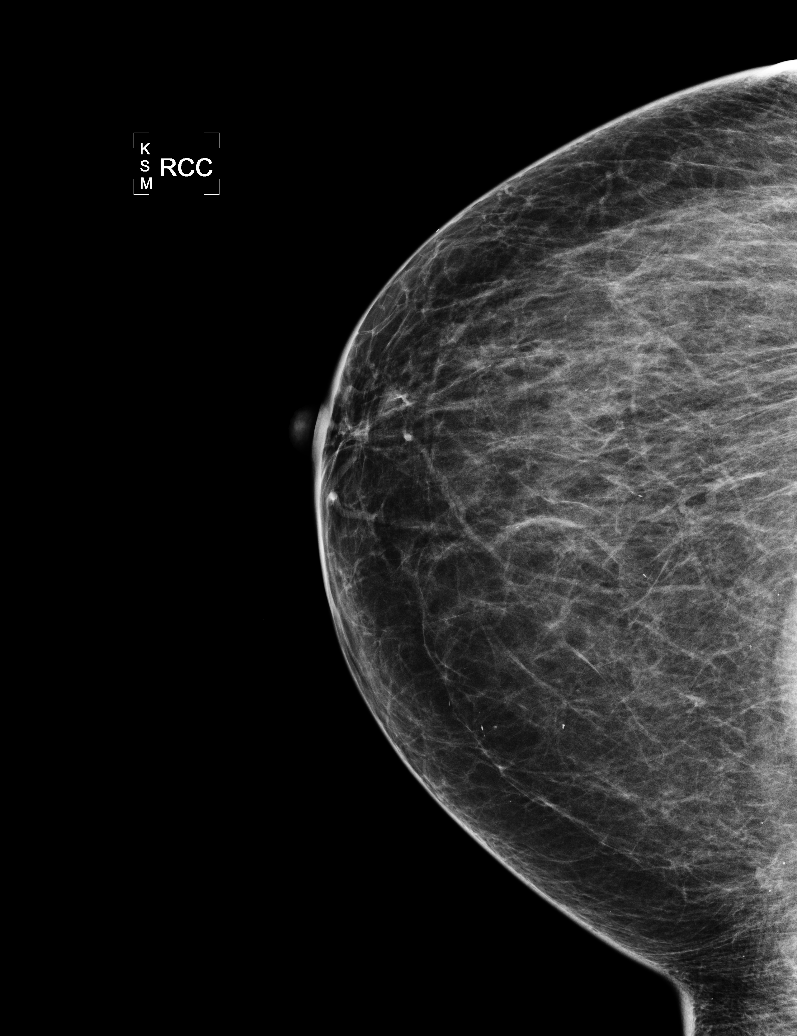

[L MLO (2 of 2)]
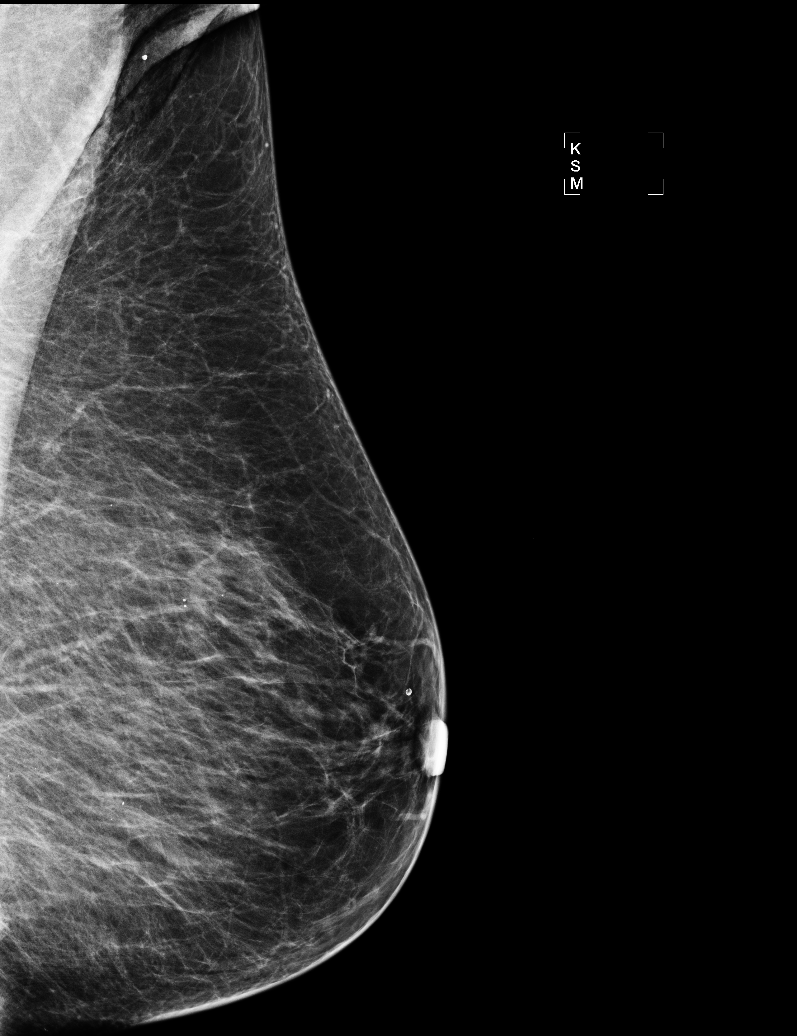

[6 of 6 positions shown; findings below may reference images not displayed]

ACR Breast Density Category c: The breast tissue is heterogeneously
dense, which may obscure small masses.
FINDINGS: In the right breast, a possible asymmetry warrants further
evaluation with spot compression views and possibly ultrasound. In
the left breast, no findings suspicious for malignancy. Images were
processed with CAD.
IMPRESSION: Further evaluation is suggested for possible asymmetry in the right
breast.

RECOMMENDATION:
Diagnostic mammogram and possibly ultrasound of the right breast.
(Code:[7G])

The patient will be contacted regarding the findings, and additional
imaging will be scheduled.

BI-RADS CATEGORY  0: Incomplete. Need additional imaging evaluation
and/or prior mammograms for comparison.

## 2014-07-12 ENCOUNTER — Other Ambulatory Visit: Payer: Self-pay | Admitting: Geriatric Medicine

## 2014-07-12 DIAGNOSIS — R928 Other abnormal and inconclusive findings on diagnostic imaging of breast: Secondary | ICD-10-CM

## 2014-07-27 ENCOUNTER — Ambulatory Visit
Admission: RE | Admit: 2014-07-27 | Discharge: 2014-07-27 | Disposition: A | Discharge status: Home / Self Care | Payer: Medicare Other | Source: Ambulatory Visit | Attending: Geriatric Medicine | Admitting: Geriatric Medicine

## 2014-07-27 DIAGNOSIS — R928 Other abnormal and inconclusive findings on diagnostic imaging of breast: Secondary | ICD-10-CM | POA: Diagnosis not present

## 2014-09-14 DIAGNOSIS — H25043 Posterior subcapsular polar age-related cataract, bilateral: Secondary | ICD-10-CM | POA: Diagnosis not present

## 2014-09-14 DIAGNOSIS — E119 Type 2 diabetes mellitus without complications: Secondary | ICD-10-CM | POA: Diagnosis not present

## 2014-09-14 DIAGNOSIS — H2513 Age-related nuclear cataract, bilateral: Secondary | ICD-10-CM | POA: Diagnosis not present

## 2014-09-22 DIAGNOSIS — F3181 Bipolar II disorder: Secondary | ICD-10-CM | POA: Diagnosis not present

## 2014-10-06 DIAGNOSIS — F3181 Bipolar II disorder: Secondary | ICD-10-CM | POA: Diagnosis not present

## 2014-10-24 DIAGNOSIS — F3181 Bipolar II disorder: Secondary | ICD-10-CM | POA: Diagnosis not present

## 2014-11-07 DIAGNOSIS — F3181 Bipolar II disorder: Secondary | ICD-10-CM | POA: Diagnosis not present

## 2014-11-22 DIAGNOSIS — F3181 Bipolar II disorder: Secondary | ICD-10-CM | POA: Diagnosis not present

## 2014-11-28 DIAGNOSIS — H2513 Age-related nuclear cataract, bilateral: Secondary | ICD-10-CM | POA: Diagnosis not present

## 2014-12-05 DIAGNOSIS — F3181 Bipolar II disorder: Secondary | ICD-10-CM | POA: Diagnosis not present

## 2014-12-22 DIAGNOSIS — F3181 Bipolar II disorder: Secondary | ICD-10-CM | POA: Diagnosis not present

## 2014-12-29 DIAGNOSIS — F3181 Bipolar II disorder: Secondary | ICD-10-CM | POA: Diagnosis not present

## 2015-01-10 DIAGNOSIS — F3181 Bipolar II disorder: Secondary | ICD-10-CM | POA: Diagnosis not present

## 2015-01-10 DIAGNOSIS — E780 Pure hypercholesterolemia: Secondary | ICD-10-CM | POA: Diagnosis not present

## 2015-01-10 DIAGNOSIS — E119 Type 2 diabetes mellitus without complications: Secondary | ICD-10-CM | POA: Diagnosis not present

## 2015-01-10 DIAGNOSIS — F319 Bipolar disorder, unspecified: Secondary | ICD-10-CM | POA: Diagnosis not present

## 2015-01-10 DIAGNOSIS — I10 Essential (primary) hypertension: Secondary | ICD-10-CM | POA: Diagnosis not present

## 2015-01-30 DIAGNOSIS — F3181 Bipolar II disorder: Secondary | ICD-10-CM | POA: Diagnosis not present

## 2015-02-06 DIAGNOSIS — F3181 Bipolar II disorder: Secondary | ICD-10-CM | POA: Diagnosis not present

## 2015-02-20 DIAGNOSIS — F3181 Bipolar II disorder: Secondary | ICD-10-CM | POA: Diagnosis not present

## 2015-02-22 DIAGNOSIS — E1140 Type 2 diabetes mellitus with diabetic neuropathy, unspecified: Secondary | ICD-10-CM | POA: Diagnosis not present

## 2015-02-22 DIAGNOSIS — E1121 Type 2 diabetes mellitus with diabetic nephropathy: Secondary | ICD-10-CM | POA: Diagnosis not present

## 2015-02-22 DIAGNOSIS — N183 Chronic kidney disease, stage 3 (moderate): Secondary | ICD-10-CM | POA: Diagnosis not present

## 2015-02-22 DIAGNOSIS — F319 Bipolar disorder, unspecified: Secondary | ICD-10-CM | POA: Diagnosis not present

## 2015-03-06 DIAGNOSIS — F3181 Bipolar II disorder: Secondary | ICD-10-CM | POA: Diagnosis not present

## 2015-03-09 DIAGNOSIS — F3181 Bipolar II disorder: Secondary | ICD-10-CM | POA: Diagnosis not present

## 2015-03-20 DIAGNOSIS — F3181 Bipolar II disorder: Secondary | ICD-10-CM | POA: Diagnosis not present

## 2015-03-29 DIAGNOSIS — F3181 Bipolar II disorder: Secondary | ICD-10-CM | POA: Diagnosis not present

## 2015-04-13 DIAGNOSIS — F3181 Bipolar II disorder: Secondary | ICD-10-CM | POA: Diagnosis not present

## 2015-04-18 DIAGNOSIS — F3181 Bipolar II disorder: Secondary | ICD-10-CM | POA: Diagnosis not present

## 2015-04-26 DIAGNOSIS — E1121 Type 2 diabetes mellitus with diabetic nephropathy: Secondary | ICD-10-CM | POA: Diagnosis not present

## 2015-04-26 DIAGNOSIS — F319 Bipolar disorder, unspecified: Secondary | ICD-10-CM | POA: Diagnosis not present

## 2015-04-26 DIAGNOSIS — Z23 Encounter for immunization: Secondary | ICD-10-CM | POA: Diagnosis not present

## 2015-04-26 DIAGNOSIS — I129 Hypertensive chronic kidney disease with stage 1 through stage 4 chronic kidney disease, or unspecified chronic kidney disease: Secondary | ICD-10-CM | POA: Diagnosis not present

## 2015-04-26 DIAGNOSIS — R232 Flushing: Secondary | ICD-10-CM | POA: Diagnosis not present

## 2015-04-26 DIAGNOSIS — S41109A Unspecified open wound of unspecified upper arm, initial encounter: Secondary | ICD-10-CM | POA: Diagnosis not present

## 2015-04-26 DIAGNOSIS — N183 Chronic kidney disease, stage 3 (moderate): Secondary | ICD-10-CM | POA: Diagnosis not present

## 2015-05-01 DIAGNOSIS — F3181 Bipolar II disorder: Secondary | ICD-10-CM | POA: Diagnosis not present

## 2015-05-11 DIAGNOSIS — F3181 Bipolar II disorder: Secondary | ICD-10-CM | POA: Diagnosis not present

## 2015-05-15 DIAGNOSIS — F3181 Bipolar II disorder: Secondary | ICD-10-CM | POA: Diagnosis not present

## 2015-05-26 DIAGNOSIS — F3181 Bipolar II disorder: Secondary | ICD-10-CM | POA: Diagnosis not present

## 2015-05-29 ENCOUNTER — Ambulatory Visit
Admission: RE | Admit: 2015-05-29 | Discharge: 2015-05-29 | Disposition: A | Discharge status: Home / Self Care | Payer: Medicare Other | Source: Ambulatory Visit | Attending: Internal Medicine | Admitting: Internal Medicine

## 2015-05-29 ENCOUNTER — Other Ambulatory Visit: Payer: Self-pay | Admitting: Internal Medicine

## 2015-05-29 DIAGNOSIS — M79672 Pain in left foot: Secondary | ICD-10-CM

## 2015-05-29 DIAGNOSIS — S91332A Puncture wound without foreign body, left foot, initial encounter: Secondary | ICD-10-CM | POA: Diagnosis not present

## 2015-05-29 IMAGING — CR DG FOOT 2V*L*
2 series · 2 of 2 positions shown · non-contrast
Comparison: None.

CLINICAL DATA: Puncture wound.  Evaluate foreign body.

EXAM:
LEFT FOOT - 2 VIEW

[view not recorded (1 of 2)]
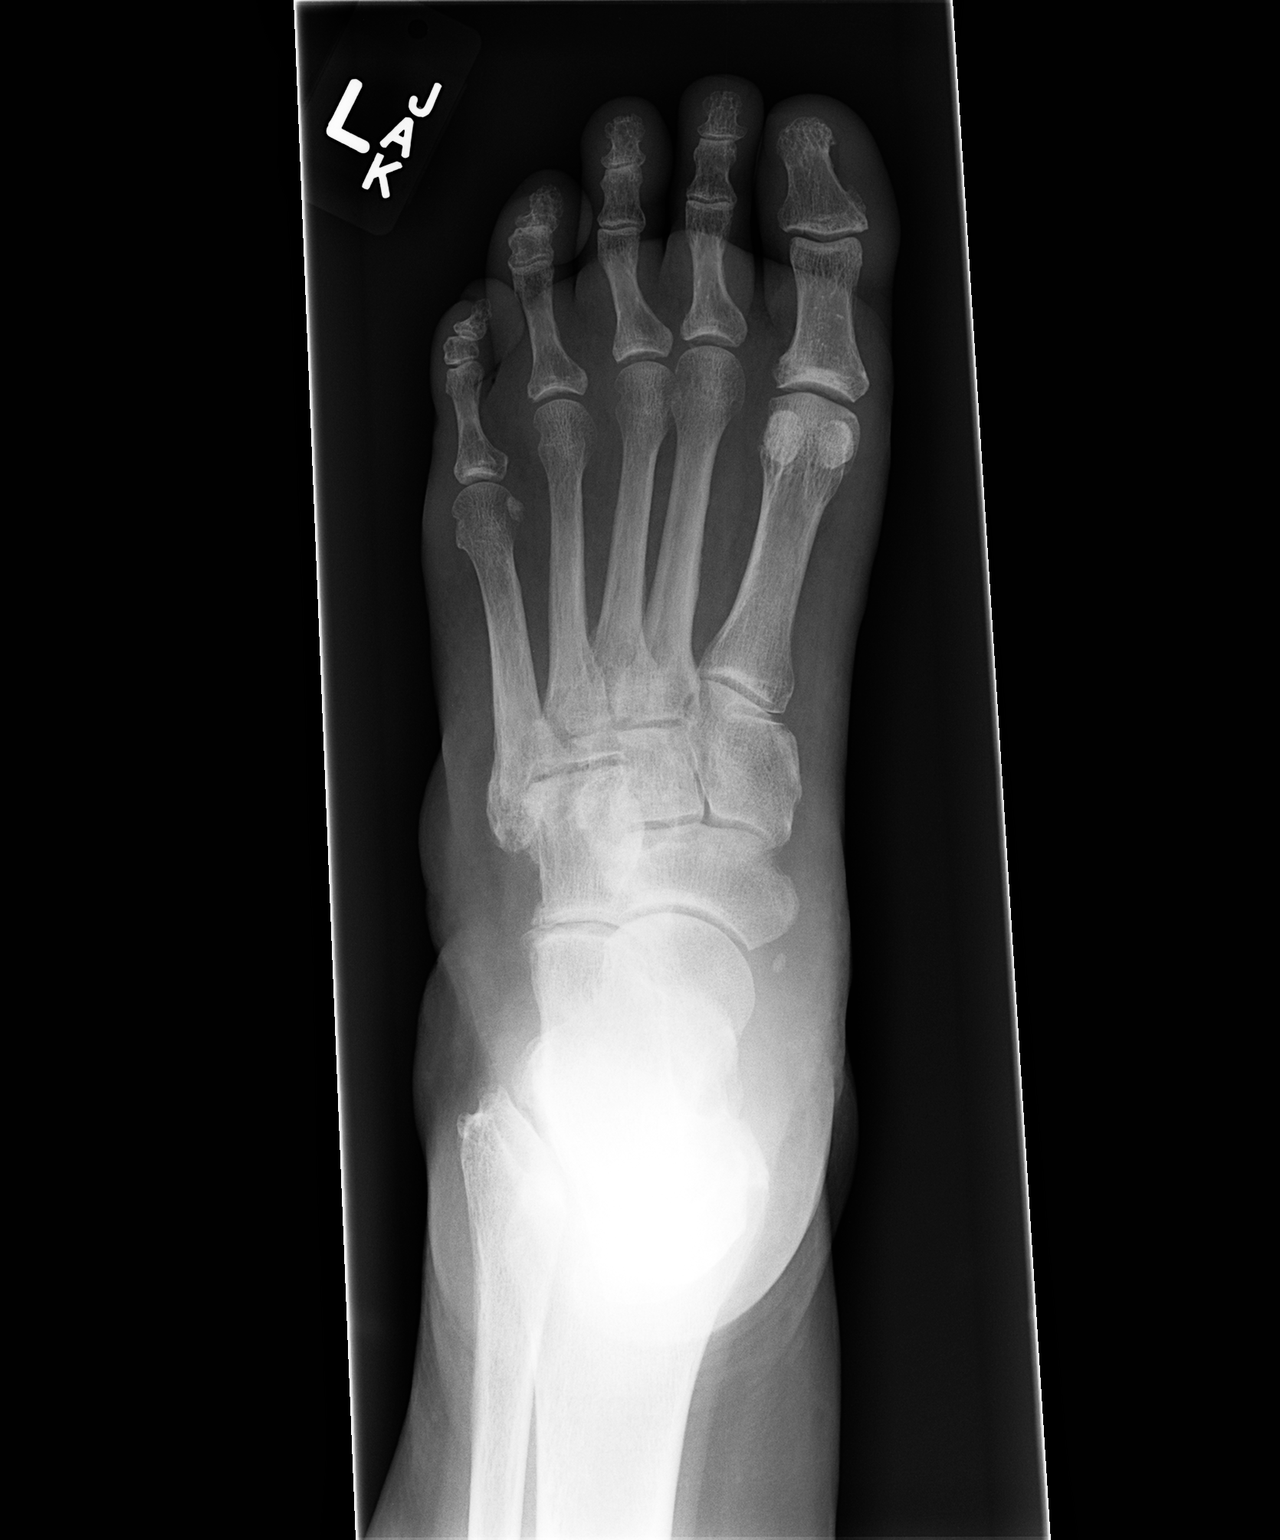

[view not recorded (2 of 2)]
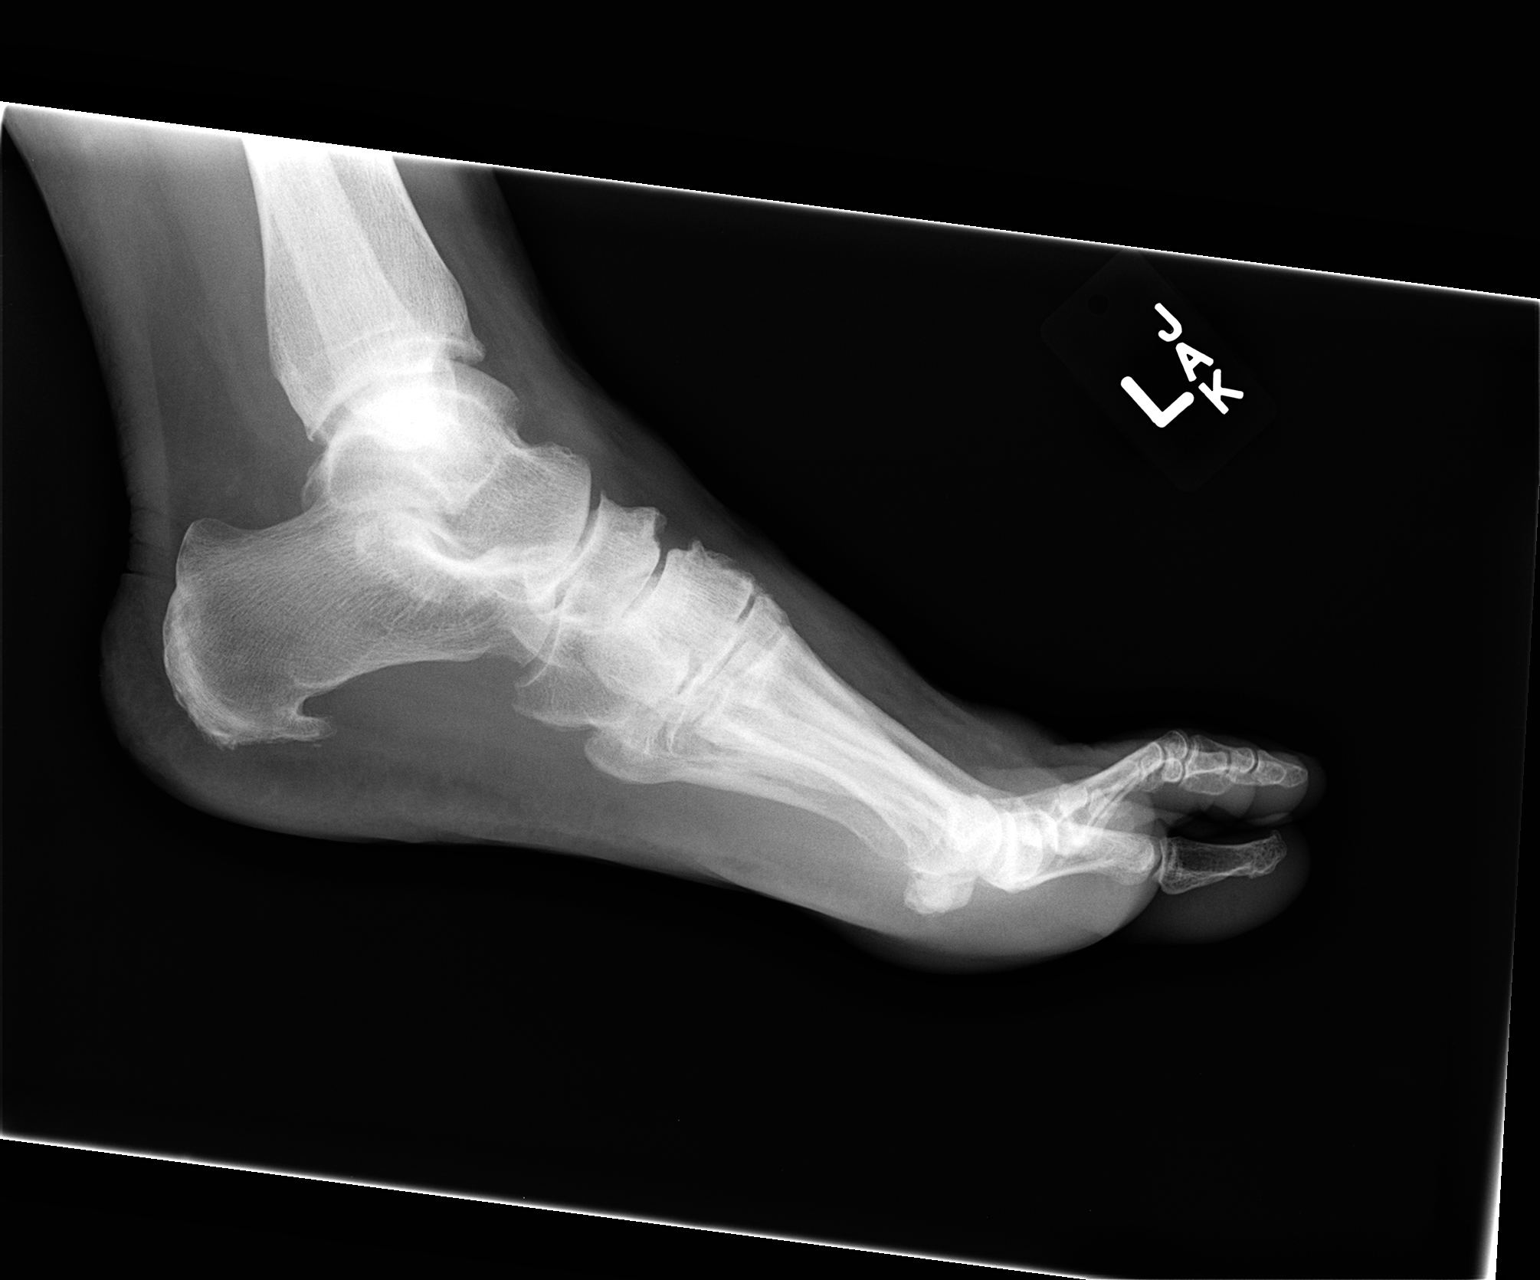

[2 of 2 positions shown; findings below may reference images not displayed]

FINDINGS: There is no evidence of fracture or dislocation. There is no
evidence of arthropathy or other focal bone abnormality. Soft
tissues are unremarkable.
IMPRESSION: No acute abnormality.  No radiopaque foreign body noted .

## 2015-05-31 DIAGNOSIS — F3181 Bipolar II disorder: Secondary | ICD-10-CM | POA: Diagnosis not present

## 2015-06-09 DIAGNOSIS — F3181 Bipolar II disorder: Secondary | ICD-10-CM | POA: Diagnosis not present

## 2015-06-12 DIAGNOSIS — F3181 Bipolar II disorder: Secondary | ICD-10-CM | POA: Diagnosis not present

## 2015-06-29 DIAGNOSIS — F3181 Bipolar II disorder: Secondary | ICD-10-CM | POA: Diagnosis not present

## 2015-07-06 ENCOUNTER — Encounter: Payer: Self-pay | Admitting: Podiatry

## 2015-07-06 ENCOUNTER — Ambulatory Visit (INDEPENDENT_AMBULATORY_CARE_PROVIDER_SITE_OTHER): Payer: Medicare Other

## 2015-07-06 ENCOUNTER — Ambulatory Visit (INDEPENDENT_AMBULATORY_CARE_PROVIDER_SITE_OTHER): Payer: Medicare Other | Admitting: Podiatry

## 2015-07-06 VITALS — BP 149/85 | HR 76 | Resp 16

## 2015-07-06 DIAGNOSIS — L97529 Non-pressure chronic ulcer of other part of left foot with unspecified severity: Secondary | ICD-10-CM

## 2015-07-06 DIAGNOSIS — E11621 Type 2 diabetes mellitus with foot ulcer: Secondary | ICD-10-CM

## 2015-07-06 DIAGNOSIS — E119 Type 2 diabetes mellitus without complications: Secondary | ICD-10-CM

## 2015-07-06 DIAGNOSIS — L89891 Pressure ulcer of other site, stage 1: Secondary | ICD-10-CM

## 2015-07-06 DIAGNOSIS — L980 Pyogenic granuloma: Secondary | ICD-10-CM

## 2015-07-06 DIAGNOSIS — Z0189 Encounter for other specified special examinations: Secondary | ICD-10-CM

## 2015-07-06 LAB — HM DIABETES FOOT EXAM

## 2015-07-06 MED ORDER — MUPIROCIN 2 % EX OINT: TOPICAL_OINTMENT | CUTANEOUS | Status: DC

## 2015-07-06 NOTE — Patient Instructions (Signed)
Diabetes and Foot Care Diabetes may cause you to have problems because of poor blood supply (circulation) to your feet and legs. This may cause the skin on your feet to become thinner, break easier, and heal more slowly. Your skin may become dry, and the skin may peel and crack. You may also have nerve damage in your legs and feet causing decreased feeling in them. You may not notice minor injuries to your feet that could lead to infections or more serious problems. Taking care of your feet is one of the most important things you can do for yourself.  HOME CARE INSTRUCTIONS  Wear shoes at all times, even in the house. Do not go barefoot. Bare feet are easily injured.  Check your feet daily for blisters, cuts, and redness. If you cannot see the bottom of your feet, use a mirror or ask someone for help.  Wash your feet with warm water (do not use hot water) and mild soap. Then pat your feet and the areas between your toes until they are completely dry. Do not soak your feet as this can dry your skin.  Apply a moisturizing lotion or petroleum jelly (that does not contain alcohol and is unscented) to the skin on your feet and to dry, brittle toenails. Do not apply lotion between your toes.  Trim your toenails straight across. Do not dig under them or around the cuticle. File the edges of your nails with an emery board or nail file.  Do not cut corns or calluses or try to remove them with medicine.  Wear clean socks or stockings every day. Make sure they are not too tight. Do not wear knee-high stockings since they may decrease blood flow to your legs.  Wear shoes that fit properly and have enough cushioning. To break in new shoes, wear them for just a few hours a day. This prevents you from injuring your feet. Always look in your shoes before you put them on to be sure there are no objects inside.  Do not cross your legs. This may decrease the blood flow to your feet.  If you find a minor scrape,  cut, or break in the skin on your feet, keep it and the skin around it clean and dry. These areas may be cleansed with mild soap and water. Do not cleanse the area with peroxide, alcohol, or iodine.  When you remove an adhesive bandage, be sure not to damage the skin around it.  If you have a wound, look at it several times a day to make sure it is healing.  Do not use heating pads or hot water bottles. They may burn your skin. If you have lost feeling in your feet or legs, you may not know it is happening until it is too late.  Make sure your health care provider performs a complete foot exam at least annually or more often if you have foot problems. Report any cuts, sores, or bruises to your health care provider immediately. SEEK MEDICAL CARE IF:   You have an injury that is not healing.  You have cuts or breaks in the skin.  You have an ingrown nail.  You notice redness on your legs or feet.  You feel burning or tingling in your legs or feet.  You have pain or cramps in your legs and feet.  Your legs or feet are numb.  Your feet always feel cold. SEEK IMMEDIATE MEDICAL CARE IF:   There is increasing redness,   swelling, or pain in or around a wound.  There is a red line that goes up your leg.  Pus is coming from a wound.  You develop a fever or as directed by your health care provider.  You notice a bad smell coming from an ulcer or wound. Document Released: 11/01/2000 Document Revised: 07/07/2013 Document Reviewed: 04/13/2013 [employer] Patient Information 2015 [employer]. This information is not intended to replace advice given to you by your health care provider. Make sure you discuss any questions you have with your health care provider.  

## 2015-07-06 NOTE — Progress Notes (Signed)
   Subjective:    Patient ID: [name] [name], female    DOB: 1946/08/28, 69 y.o.   MRN: [id number]  HPI Comments:    Diabetic x 12 years last A1c unknown  Diabetes Hypoglycemia symptoms include nervousness/anxiousness. Associated symptoms include fatigue.    She presents today with a chief complaint of the superficial ulceration or wound to the plantar aspect of her left foot. She states that it was there a few weeks ago and developed a large blister which ruptured and drained a very thick bloody substance. She was then placed on an antibiotic which she took once a day but cannot recall the name we started to improve the lesion. She states and she developed a second blister which was ruptured and it was also treated with antibiotics cephalexin 3 times a day. Currently she states that the wound is still present but is less painful.   Review of Systems  Constitutional: Positive for diaphoresis and fatigue.  Cardiovascular: Positive for leg swelling.  Gastrointestinal: Positive for diarrhea and constipation.  Endocrine: Positive for heat intolerance.  Musculoskeletal: Positive for back pain, arthralgias and gait problem.  Skin: Positive for wound.  Hematological: Bruises/bleeds easily.  Psychiatric/Behavioral: The patient is nervous/anxious.   All other systems reviewed and are negative.      Objective:   Physical Exam : 69 year old white female in no acute distress pulses are palpable left lower extremity. Neurologic sensorium is intact for [employer] monofilament. Deep tendon reflexes are intact. Muscle strength is 5 over 5 dorsiflexion plantar flexors and inverters fevers all intrinsic musculature is intact. Orthopedic evaluation demonstrates all joints distal to the ankle for range of motion without crepitation. Cutaneous evaluation demonstrates a well-hydrated cutis superficial ulceration with granuloma to the base of the fourth toe at the attachment. There is no cellulitis noted  drainage no odor at this point. Radiographs taken today do not demonstrate any type of osseus abnormalities in this area. These radiographs are taken in the office for further details the radiographic report.        Assessment & Plan:   assessment: pyogenic granuloma and diabetic ulceration sub-fourth digit left foot.  Plan: she will continue her antibiotics on a regular basis start soaking this foot twice daily in Epson salts and warm water. Apply a small amount of Bactroban ointment which we prescribed to the area after soaking daily. She understands this and is amenable to it I will follow-up with her in 2 weeks.

## 2015-07-12 DIAGNOSIS — F3181 Bipolar II disorder: Secondary | ICD-10-CM | POA: Diagnosis not present

## 2015-07-13 DIAGNOSIS — F3181 Bipolar II disorder: Secondary | ICD-10-CM | POA: Diagnosis not present

## 2015-07-25 ENCOUNTER — Ambulatory Visit (INDEPENDENT_AMBULATORY_CARE_PROVIDER_SITE_OTHER): Payer: Medicare Other | Admitting: Podiatry

## 2015-07-25 ENCOUNTER — Encounter: Payer: Self-pay | Admitting: Podiatry

## 2015-07-25 VITALS — BP 179/101 | HR 67 | Resp 16

## 2015-07-25 DIAGNOSIS — L600 Ingrowing nail: Secondary | ICD-10-CM

## 2015-07-25 DIAGNOSIS — L89891 Pressure ulcer of other site, stage 1: Secondary | ICD-10-CM

## 2015-07-25 DIAGNOSIS — L923 Foreign body granuloma of the skin and subcutaneous tissue: Secondary | ICD-10-CM

## 2015-07-25 NOTE — Progress Notes (Signed)
She presents today for follow-up of the superficial wound to the plantar aspect of the left foot just beneath fourth toe. She states it is still quite tender and she continues to soak it daily and apply the Bactroban ointment. She denies fever chills nausea vomiting muscle aches and pains.  Objective: Vital signs are stable she is alert and oriented 3. Ulceration appears to be doing better however there is small granulation tissue that has recurred. I trimmed away this granulation tissue and as doing so a small less than 1 cm in length spicule of 1 or splinter appeared. I removed this in total no purulence is noted. I placed Betadine ointment and a Band-Aid.  Assessment chronic ulceration secondary foreign body left foot. Removal foreign body left foot.  Plan: Remove the foreign body today and expect this to go to heal 100%. She's continue to soak in Epsom salts and warm water and apply Bactroban ointment until heal. I will follow up with her in 2 weeks at which time we will consider performing an ingrown toenail procedure.

## 2015-07-27 DIAGNOSIS — F3181 Bipolar II disorder: Secondary | ICD-10-CM | POA: Diagnosis not present

## 2015-08-08 ENCOUNTER — Ambulatory Visit (INDEPENDENT_AMBULATORY_CARE_PROVIDER_SITE_OTHER): Payer: Medicare Other | Admitting: Podiatry

## 2015-08-08 ENCOUNTER — Encounter: Payer: Self-pay | Admitting: Podiatry

## 2015-08-08 VITALS — BP 146/78 | HR 79 | Resp 16

## 2015-08-08 DIAGNOSIS — L923 Foreign body granuloma of the skin and subcutaneous tissue: Secondary | ICD-10-CM | POA: Diagnosis not present

## 2015-08-08 DIAGNOSIS — L600 Ingrowing nail: Secondary | ICD-10-CM

## 2015-08-08 MED ORDER — NEOMYCIN-POLYMYXIN-HC 3.5-10000-1 OT SOLN: OTIC | Status: DC

## 2015-08-08 NOTE — Patient Instructions (Signed)

## 2015-08-08 NOTE — Progress Notes (Signed)
She presents today for follow-up of removal foreign body from the distal aspect of her plantar left foot. She states that is doing absolutely wonderful and hasn't given any problems since we removed the foreign body. She also goes on to state that she has sharp incurvated nail margins along the tibial borders of the hallux bilateral she states that they've been sore for quite some time and she would like to consider having them corrected if possible. She denies fever chills nausea vomiting. She denies any changes in her past medical history medications allergies surgeries or social history.  Objective: Vital signs are stable she is alert and oriented 3. Pulses are strongly palpable. Neurologic sensorium is intact per Semmes-Weinstein monofilament. Deep tendon reflexes are intact. Muscle strength +5 over 5 dorsiflexion plantar flexors inverters and everters. Orthopedic evaluation demonstrates all joints distal to the ankle have a full range of motion without crepitation. Cutaneous evaluation of Mr. sharp incurvated nail margins with erythema along the tibial borders of the hallux bilateral. These are exquisitely tender on palpation. She also has a completely resolved ulcerative lesion to the plantar aspect of the left foot after a foreign body was removed last visit. No signs of infection to this left foot other than mild paronychia to the hallux.  Assessment: Well-healing ulcerative site left foot. Ingrown toenails hallux bilateral paronychia hallux bilateral.  Plan: Discussed etiology pathology conservative versus surgical therapies. Chemical matrixectomy's were performed to the tibial borders of the hallux today after local anesthesia was administered. She tolerated this procedure well. Both all and written home-going instructions were provided as well as a prescription for Cortisporin Otic to be applied twice daily after soaking. Follow-up with her in 1 week.  Roselind Messier DPM

## 2015-08-10 DIAGNOSIS — F3181 Bipolar II disorder: Secondary | ICD-10-CM | POA: Diagnosis not present

## 2015-08-15 ENCOUNTER — Ambulatory Visit (INDEPENDENT_AMBULATORY_CARE_PROVIDER_SITE_OTHER): Payer: Medicare Other | Admitting: Podiatry

## 2015-08-15 ENCOUNTER — Encounter: Payer: Self-pay | Admitting: Podiatry

## 2015-08-15 DIAGNOSIS — L600 Ingrowing nail: Secondary | ICD-10-CM

## 2015-08-15 NOTE — Progress Notes (Signed)
She presents today for follow-up of her matrixectomy is hallux bilateral. She states that she did just fine with that she had a little bleeding but all in all he feel much better. She continues to soak in Betadine and water and apply Cortisporin Otic as directed.  Objective: Vital signs are stable alert and oriented 3. Pulses are strongly palpable. Neurologic sensorium is intact per Semmes-Weinstein monofilament. Deep tendon reflexes are intact bilateral and muscle strength is intact bilateral. Cutaneous evaluation of straight supple well-hydrated cutis no erythema edema cellulitis drainage or odor. Margins appear to be healing uneventfully.  Assessment: Well-healing surgical toe hallux bilateral.  Plan: Discontinue Betadine sterile with Epsom salts and warm water twice daily. Continue Cortisporin Otic drops. Covered during the daytime and leave open at night time. She will continue to soak until this is completely resolved.

## 2015-08-15 NOTE — Patient Instructions (Signed)

## 2015-08-22 DIAGNOSIS — F3181 Bipolar II disorder: Secondary | ICD-10-CM | POA: Diagnosis not present

## 2015-09-12 DIAGNOSIS — F3181 Bipolar II disorder: Secondary | ICD-10-CM | POA: Diagnosis not present

## 2015-09-21 DIAGNOSIS — F3181 Bipolar II disorder: Secondary | ICD-10-CM | POA: Diagnosis not present

## 2015-10-11 DIAGNOSIS — Z23 Encounter for immunization: Secondary | ICD-10-CM | POA: Diagnosis not present

## 2015-10-11 DIAGNOSIS — Z0000 Encounter for general adult medical examination without abnormal findings: Secondary | ICD-10-CM | POA: Diagnosis not present

## 2015-10-11 DIAGNOSIS — E1140 Type 2 diabetes mellitus with diabetic neuropathy, unspecified: Secondary | ICD-10-CM | POA: Diagnosis not present

## 2015-10-11 DIAGNOSIS — N183 Chronic kidney disease, stage 3 (moderate): Secondary | ICD-10-CM | POA: Diagnosis not present

## 2015-10-11 DIAGNOSIS — E1121 Type 2 diabetes mellitus with diabetic nephropathy: Secondary | ICD-10-CM | POA: Diagnosis not present

## 2015-10-11 DIAGNOSIS — I129 Hypertensive chronic kidney disease with stage 1 through stage 4 chronic kidney disease, or unspecified chronic kidney disease: Secondary | ICD-10-CM | POA: Diagnosis not present

## 2015-10-11 DIAGNOSIS — F319 Bipolar disorder, unspecified: Secondary | ICD-10-CM | POA: Diagnosis not present

## 2015-10-11 DIAGNOSIS — Z79899 Other long term (current) drug therapy: Secondary | ICD-10-CM | POA: Diagnosis not present

## 2015-10-11 DIAGNOSIS — Z7984 Long term (current) use of oral hypoglycemic drugs: Secondary | ICD-10-CM | POA: Diagnosis not present

## 2015-10-11 DIAGNOSIS — I890 Lymphedema, not elsewhere classified: Secondary | ICD-10-CM | POA: Diagnosis not present

## 2015-10-11 DIAGNOSIS — E7800 Pure hypercholesterolemia, unspecified: Secondary | ICD-10-CM | POA: Diagnosis not present

## 2015-10-11 DIAGNOSIS — L749 Eccrine sweat disorder, unspecified: Secondary | ICD-10-CM | POA: Diagnosis not present

## 2015-10-16 DIAGNOSIS — F3181 Bipolar II disorder: Secondary | ICD-10-CM | POA: Diagnosis not present

## 2015-10-18 DIAGNOSIS — Z79899 Other long term (current) drug therapy: Secondary | ICD-10-CM | POA: Diagnosis not present

## 2015-10-25 DIAGNOSIS — I890 Lymphedema, not elsewhere classified: Secondary | ICD-10-CM | POA: Diagnosis not present

## 2015-10-25 DIAGNOSIS — R29898 Other symptoms and signs involving the musculoskeletal system: Secondary | ICD-10-CM | POA: Diagnosis not present

## 2015-10-25 DIAGNOSIS — Z8781 Personal history of (healed) traumatic fracture: Secondary | ICD-10-CM | POA: Diagnosis not present

## 2015-10-31 DIAGNOSIS — I890 Lymphedema, not elsewhere classified: Secondary | ICD-10-CM | POA: Diagnosis not present

## 2015-10-31 DIAGNOSIS — Z8781 Personal history of (healed) traumatic fracture: Secondary | ICD-10-CM | POA: Diagnosis not present

## 2015-10-31 DIAGNOSIS — R29898 Other symptoms and signs involving the musculoskeletal system: Secondary | ICD-10-CM | POA: Diagnosis not present

## 2015-11-01 DIAGNOSIS — M549 Dorsalgia, unspecified: Secondary | ICD-10-CM | POA: Diagnosis not present

## 2015-11-01 DIAGNOSIS — I129 Hypertensive chronic kidney disease with stage 1 through stage 4 chronic kidney disease, or unspecified chronic kidney disease: Secondary | ICD-10-CM | POA: Diagnosis not present

## 2015-11-01 DIAGNOSIS — Z79899 Other long term (current) drug therapy: Secondary | ICD-10-CM | POA: Diagnosis not present

## 2015-11-01 DIAGNOSIS — N183 Chronic kidney disease, stage 3 (moderate): Secondary | ICD-10-CM | POA: Diagnosis not present

## 2015-11-02 DIAGNOSIS — I890 Lymphedema, not elsewhere classified: Secondary | ICD-10-CM | POA: Diagnosis not present

## 2015-11-02 DIAGNOSIS — Z8781 Personal history of (healed) traumatic fracture: Secondary | ICD-10-CM | POA: Diagnosis not present

## 2015-11-02 DIAGNOSIS — R29898 Other symptoms and signs involving the musculoskeletal system: Secondary | ICD-10-CM | POA: Diagnosis not present

## 2015-11-06 DIAGNOSIS — I890 Lymphedema, not elsewhere classified: Secondary | ICD-10-CM | POA: Diagnosis not present

## 2015-11-06 DIAGNOSIS — Z8781 Personal history of (healed) traumatic fracture: Secondary | ICD-10-CM | POA: Diagnosis not present

## 2015-11-06 DIAGNOSIS — F3181 Bipolar II disorder: Secondary | ICD-10-CM | POA: Diagnosis not present

## 2015-11-06 DIAGNOSIS — R29898 Other symptoms and signs involving the musculoskeletal system: Secondary | ICD-10-CM | POA: Diagnosis not present

## 2015-11-08 DIAGNOSIS — R29898 Other symptoms and signs involving the musculoskeletal system: Secondary | ICD-10-CM | POA: Diagnosis not present

## 2015-11-08 DIAGNOSIS — I890 Lymphedema, not elsewhere classified: Secondary | ICD-10-CM | POA: Diagnosis not present

## 2015-11-08 DIAGNOSIS — Z8781 Personal history of (healed) traumatic fracture: Secondary | ICD-10-CM | POA: Diagnosis not present

## 2015-11-14 DIAGNOSIS — R29898 Other symptoms and signs involving the musculoskeletal system: Secondary | ICD-10-CM | POA: Diagnosis not present

## 2015-11-14 DIAGNOSIS — I890 Lymphedema, not elsewhere classified: Secondary | ICD-10-CM | POA: Diagnosis not present

## 2015-11-14 DIAGNOSIS — Z8781 Personal history of (healed) traumatic fracture: Secondary | ICD-10-CM | POA: Diagnosis not present

## 2015-11-16 DIAGNOSIS — N183 Chronic kidney disease, stage 3 (moderate): Secondary | ICD-10-CM | POA: Diagnosis not present

## 2015-11-16 DIAGNOSIS — I890 Lymphedema, not elsewhere classified: Secondary | ICD-10-CM | POA: Diagnosis not present

## 2015-11-16 DIAGNOSIS — Z8781 Personal history of (healed) traumatic fracture: Secondary | ICD-10-CM | POA: Diagnosis not present

## 2015-11-16 DIAGNOSIS — R29898 Other symptoms and signs involving the musculoskeletal system: Secondary | ICD-10-CM | POA: Diagnosis not present

## 2015-11-22 DIAGNOSIS — I890 Lymphedema, not elsewhere classified: Secondary | ICD-10-CM | POA: Diagnosis not present

## 2015-11-29 DIAGNOSIS — I890 Lymphedema, not elsewhere classified: Secondary | ICD-10-CM | POA: Diagnosis not present

## 2015-11-30 DIAGNOSIS — F3181 Bipolar II disorder: Secondary | ICD-10-CM | POA: Diagnosis not present

## 2015-12-05 DIAGNOSIS — I890 Lymphedema, not elsewhere classified: Secondary | ICD-10-CM | POA: Diagnosis not present

## 2015-12-07 DIAGNOSIS — I890 Lymphedema, not elsewhere classified: Secondary | ICD-10-CM | POA: Diagnosis not present

## 2015-12-11 DIAGNOSIS — I890 Lymphedema, not elsewhere classified: Secondary | ICD-10-CM | POA: Diagnosis not present

## 2015-12-11 DIAGNOSIS — E1121 Type 2 diabetes mellitus with diabetic nephropathy: Secondary | ICD-10-CM | POA: Diagnosis not present

## 2015-12-11 DIAGNOSIS — Z7984 Long term (current) use of oral hypoglycemic drugs: Secondary | ICD-10-CM | POA: Diagnosis not present

## 2015-12-11 DIAGNOSIS — N183 Chronic kidney disease, stage 3 (moderate): Secondary | ICD-10-CM | POA: Diagnosis not present

## 2015-12-11 DIAGNOSIS — E1140 Type 2 diabetes mellitus with diabetic neuropathy, unspecified: Secondary | ICD-10-CM | POA: Diagnosis not present

## 2015-12-11 DIAGNOSIS — I129 Hypertensive chronic kidney disease with stage 1 through stage 4 chronic kidney disease, or unspecified chronic kidney disease: Secondary | ICD-10-CM | POA: Diagnosis not present

## 2015-12-14 DIAGNOSIS — F3181 Bipolar II disorder: Secondary | ICD-10-CM | POA: Diagnosis not present

## 2015-12-18 DIAGNOSIS — I890 Lymphedema, not elsewhere classified: Secondary | ICD-10-CM | POA: Diagnosis not present

## 2015-12-20 DIAGNOSIS — I890 Lymphedema, not elsewhere classified: Secondary | ICD-10-CM | POA: Diagnosis not present

## 2015-12-25 DIAGNOSIS — I890 Lymphedema, not elsewhere classified: Secondary | ICD-10-CM | POA: Diagnosis not present

## 2015-12-27 DIAGNOSIS — I890 Lymphedema, not elsewhere classified: Secondary | ICD-10-CM | POA: Diagnosis not present

## 2015-12-28 DIAGNOSIS — F3181 Bipolar II disorder: Secondary | ICD-10-CM | POA: Diagnosis not present

## 2016-01-01 DIAGNOSIS — I890 Lymphedema, not elsewhere classified: Secondary | ICD-10-CM | POA: Diagnosis not present

## 2016-01-03 DIAGNOSIS — I890 Lymphedema, not elsewhere classified: Secondary | ICD-10-CM | POA: Diagnosis not present

## 2016-01-08 DIAGNOSIS — I890 Lymphedema, not elsewhere classified: Secondary | ICD-10-CM | POA: Diagnosis not present

## 2016-01-10 DIAGNOSIS — M85861 Other specified disorders of bone density and structure, right lower leg: Secondary | ICD-10-CM | POA: Diagnosis not present

## 2016-01-10 DIAGNOSIS — M85862 Other specified disorders of bone density and structure, left lower leg: Secondary | ICD-10-CM | POA: Diagnosis not present

## 2016-01-10 DIAGNOSIS — M170 Bilateral primary osteoarthritis of knee: Secondary | ICD-10-CM | POA: Diagnosis not present

## 2016-01-10 DIAGNOSIS — M25762 Osteophyte, left knee: Secondary | ICD-10-CM | POA: Diagnosis not present

## 2016-01-10 DIAGNOSIS — M25862 Other specified joint disorders, left knee: Secondary | ICD-10-CM | POA: Diagnosis not present

## 2016-01-10 DIAGNOSIS — M25861 Other specified joint disorders, right knee: Secondary | ICD-10-CM | POA: Diagnosis not present

## 2016-01-10 DIAGNOSIS — M25761 Osteophyte, right knee: Secondary | ICD-10-CM | POA: Diagnosis not present

## 2016-01-15 DIAGNOSIS — I890 Lymphedema, not elsewhere classified: Secondary | ICD-10-CM | POA: Diagnosis not present

## 2016-01-25 DIAGNOSIS — F3181 Bipolar II disorder: Secondary | ICD-10-CM | POA: Diagnosis not present

## 2016-02-05 DIAGNOSIS — F3181 Bipolar II disorder: Secondary | ICD-10-CM | POA: Diagnosis not present

## 2016-02-08 DIAGNOSIS — M6281 Muscle weakness (generalized): Secondary | ICD-10-CM | POA: Diagnosis not present

## 2016-02-08 DIAGNOSIS — R269 Unspecified abnormalities of gait and mobility: Secondary | ICD-10-CM | POA: Diagnosis not present

## 2016-02-08 DIAGNOSIS — I890 Lymphedema, not elsewhere classified: Secondary | ICD-10-CM | POA: Diagnosis not present

## 2016-02-12 DIAGNOSIS — M6281 Muscle weakness (generalized): Secondary | ICD-10-CM | POA: Diagnosis not present

## 2016-02-12 DIAGNOSIS — I890 Lymphedema, not elsewhere classified: Secondary | ICD-10-CM | POA: Diagnosis not present

## 2016-02-12 DIAGNOSIS — R269 Unspecified abnormalities of gait and mobility: Secondary | ICD-10-CM | POA: Diagnosis not present

## 2016-02-15 DIAGNOSIS — R269 Unspecified abnormalities of gait and mobility: Secondary | ICD-10-CM | POA: Diagnosis not present

## 2016-02-15 DIAGNOSIS — I890 Lymphedema, not elsewhere classified: Secondary | ICD-10-CM | POA: Diagnosis not present

## 2016-02-15 DIAGNOSIS — M6281 Muscle weakness (generalized): Secondary | ICD-10-CM | POA: Diagnosis not present

## 2016-02-20 DIAGNOSIS — R29898 Other symptoms and signs involving the musculoskeletal system: Secondary | ICD-10-CM | POA: Diagnosis not present

## 2016-02-20 DIAGNOSIS — I890 Lymphedema, not elsewhere classified: Secondary | ICD-10-CM | POA: Diagnosis not present

## 2016-02-20 DIAGNOSIS — R269 Unspecified abnormalities of gait and mobility: Secondary | ICD-10-CM | POA: Diagnosis not present

## 2016-02-22 DIAGNOSIS — R29898 Other symptoms and signs involving the musculoskeletal system: Secondary | ICD-10-CM | POA: Diagnosis not present

## 2016-02-22 DIAGNOSIS — I890 Lymphedema, not elsewhere classified: Secondary | ICD-10-CM | POA: Diagnosis not present

## 2016-02-22 DIAGNOSIS — R269 Unspecified abnormalities of gait and mobility: Secondary | ICD-10-CM | POA: Diagnosis not present

## 2016-02-29 DIAGNOSIS — F3181 Bipolar II disorder: Secondary | ICD-10-CM | POA: Diagnosis not present

## 2016-02-29 DIAGNOSIS — R29898 Other symptoms and signs involving the musculoskeletal system: Secondary | ICD-10-CM | POA: Diagnosis not present

## 2016-02-29 DIAGNOSIS — I890 Lymphedema, not elsewhere classified: Secondary | ICD-10-CM | POA: Diagnosis not present

## 2016-02-29 DIAGNOSIS — R269 Unspecified abnormalities of gait and mobility: Secondary | ICD-10-CM | POA: Diagnosis not present

## 2016-03-05 DIAGNOSIS — R29898 Other symptoms and signs involving the musculoskeletal system: Secondary | ICD-10-CM | POA: Diagnosis not present

## 2016-03-05 DIAGNOSIS — I890 Lymphedema, not elsewhere classified: Secondary | ICD-10-CM | POA: Diagnosis not present

## 2016-03-05 DIAGNOSIS — R269 Unspecified abnormalities of gait and mobility: Secondary | ICD-10-CM | POA: Diagnosis not present

## 2016-03-07 DIAGNOSIS — R29898 Other symptoms and signs involving the musculoskeletal system: Secondary | ICD-10-CM | POA: Diagnosis not present

## 2016-03-07 DIAGNOSIS — I890 Lymphedema, not elsewhere classified: Secondary | ICD-10-CM | POA: Diagnosis not present

## 2016-03-07 DIAGNOSIS — R269 Unspecified abnormalities of gait and mobility: Secondary | ICD-10-CM | POA: Diagnosis not present

## 2016-03-11 DIAGNOSIS — F4321 Adjustment disorder with depressed mood: Secondary | ICD-10-CM | POA: Diagnosis not present

## 2016-03-11 DIAGNOSIS — I890 Lymphedema, not elsewhere classified: Secondary | ICD-10-CM | POA: Diagnosis not present

## 2016-03-11 DIAGNOSIS — R29898 Other symptoms and signs involving the musculoskeletal system: Secondary | ICD-10-CM | POA: Diagnosis not present

## 2016-03-11 DIAGNOSIS — F3181 Bipolar II disorder: Secondary | ICD-10-CM | POA: Diagnosis not present

## 2016-03-11 DIAGNOSIS — R269 Unspecified abnormalities of gait and mobility: Secondary | ICD-10-CM | POA: Diagnosis not present

## 2016-03-21 DIAGNOSIS — F3181 Bipolar II disorder: Secondary | ICD-10-CM | POA: Diagnosis not present

## 2016-04-04 DIAGNOSIS — E1121 Type 2 diabetes mellitus with diabetic nephropathy: Secondary | ICD-10-CM | POA: Diagnosis not present

## 2016-04-04 DIAGNOSIS — E1140 Type 2 diabetes mellitus with diabetic neuropathy, unspecified: Secondary | ICD-10-CM | POA: Diagnosis not present

## 2016-04-04 DIAGNOSIS — I129 Hypertensive chronic kidney disease with stage 1 through stage 4 chronic kidney disease, or unspecified chronic kidney disease: Secondary | ICD-10-CM | POA: Diagnosis not present

## 2016-04-04 DIAGNOSIS — R197 Diarrhea, unspecified: Secondary | ICD-10-CM | POA: Diagnosis not present

## 2016-04-04 DIAGNOSIS — F4321 Adjustment disorder with depressed mood: Secondary | ICD-10-CM | POA: Diagnosis not present

## 2016-04-04 DIAGNOSIS — Z7984 Long term (current) use of oral hypoglycemic drugs: Secondary | ICD-10-CM | POA: Diagnosis not present

## 2016-04-04 DIAGNOSIS — N183 Chronic kidney disease, stage 3 (moderate): Secondary | ICD-10-CM | POA: Diagnosis not present

## 2016-04-04 DIAGNOSIS — F3181 Bipolar II disorder: Secondary | ICD-10-CM | POA: Diagnosis not present

## 2016-04-04 DIAGNOSIS — D692 Other nonthrombocytopenic purpura: Secondary | ICD-10-CM | POA: Diagnosis not present

## 2016-04-04 DIAGNOSIS — Z79899 Other long term (current) drug therapy: Secondary | ICD-10-CM | POA: Diagnosis not present

## 2016-04-15 DIAGNOSIS — F3181 Bipolar II disorder: Secondary | ICD-10-CM | POA: Diagnosis not present

## 2016-04-15 DIAGNOSIS — F4321 Adjustment disorder with depressed mood: Secondary | ICD-10-CM | POA: Diagnosis not present

## 2016-05-07 DIAGNOSIS — N183 Chronic kidney disease, stage 3 (moderate): Secondary | ICD-10-CM | POA: Diagnosis not present

## 2016-05-15 DIAGNOSIS — F3181 Bipolar II disorder: Secondary | ICD-10-CM | POA: Diagnosis not present

## 2016-05-15 DIAGNOSIS — F4321 Adjustment disorder with depressed mood: Secondary | ICD-10-CM | POA: Diagnosis not present

## 2016-05-22 DIAGNOSIS — L218 Other seborrheic dermatitis: Secondary | ICD-10-CM | POA: Diagnosis not present

## 2016-05-30 DIAGNOSIS — F3181 Bipolar II disorder: Secondary | ICD-10-CM | POA: Diagnosis not present

## 2016-06-04 DIAGNOSIS — N183 Chronic kidney disease, stage 3 (moderate): Secondary | ICD-10-CM | POA: Diagnosis not present

## 2016-06-13 DIAGNOSIS — F3181 Bipolar II disorder: Secondary | ICD-10-CM | POA: Diagnosis not present

## 2016-06-13 DIAGNOSIS — F4321 Adjustment disorder with depressed mood: Secondary | ICD-10-CM | POA: Diagnosis not present

## 2016-06-27 DIAGNOSIS — F3181 Bipolar II disorder: Secondary | ICD-10-CM | POA: Diagnosis not present

## 2016-06-27 DIAGNOSIS — F4321 Adjustment disorder with depressed mood: Secondary | ICD-10-CM | POA: Diagnosis not present

## 2016-07-05 DIAGNOSIS — Z79899 Other long term (current) drug therapy: Secondary | ICD-10-CM | POA: Diagnosis not present

## 2016-07-11 DIAGNOSIS — F4321 Adjustment disorder with depressed mood: Secondary | ICD-10-CM | POA: Diagnosis not present

## 2016-07-11 DIAGNOSIS — F3181 Bipolar II disorder: Secondary | ICD-10-CM | POA: Diagnosis not present

## 2016-07-25 DIAGNOSIS — F3181 Bipolar II disorder: Secondary | ICD-10-CM | POA: Diagnosis not present

## 2016-07-25 DIAGNOSIS — F4321 Adjustment disorder with depressed mood: Secondary | ICD-10-CM | POA: Diagnosis not present

## 2016-08-08 DIAGNOSIS — F4321 Adjustment disorder with depressed mood: Secondary | ICD-10-CM | POA: Diagnosis not present

## 2016-08-08 DIAGNOSIS — F3181 Bipolar II disorder: Secondary | ICD-10-CM | POA: Diagnosis not present

## 2016-08-13 ENCOUNTER — Ambulatory Visit
Admission: RE | Admit: 2016-08-13 | Discharge: 2016-08-13 | Disposition: A | Discharge status: Home / Self Care | Payer: Medicare Other | Source: Ambulatory Visit | Attending: Nurse Practitioner | Admitting: Nurse Practitioner

## 2016-08-13 ENCOUNTER — Other Ambulatory Visit: Payer: Self-pay | Admitting: Nurse Practitioner

## 2016-08-13 DIAGNOSIS — M79661 Pain in right lower leg: Secondary | ICD-10-CM

## 2016-08-13 IMAGING — US US EXTREM LOW VENOUS*R*
1 series · 13 of 24 positions shown · non-contrast
Comparison: None.

CLINICAL DATA: Posterior and lateral right knee pain.

EXAM:
RIGHT LOWER EXTREMITY VENOUS DOPPLER ULTRASOUND
TECHNIQUE: Gray-scale sonography with graded compression, as well as color
Doppler and duplex ultrasound, were performed to evaluate the deep
venous system from the level of the common femoral vein through the
popliteal and proximal calf veins. Spectral Doppler was utilized to
evaluate flow at rest and with distal augmentation maneuvers.

[Series 1: us extrem low venous*right* · 51 acquisitions, 13 frames shown]
[im 1/51]
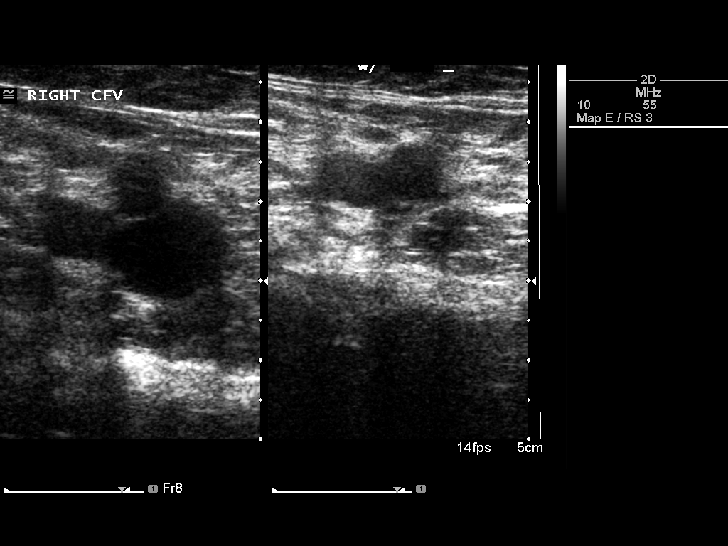
[im 5/51]
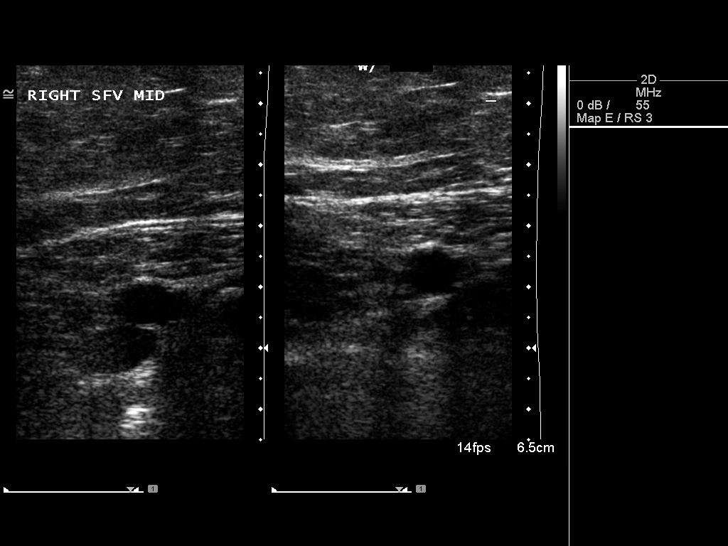
[im 9/51]
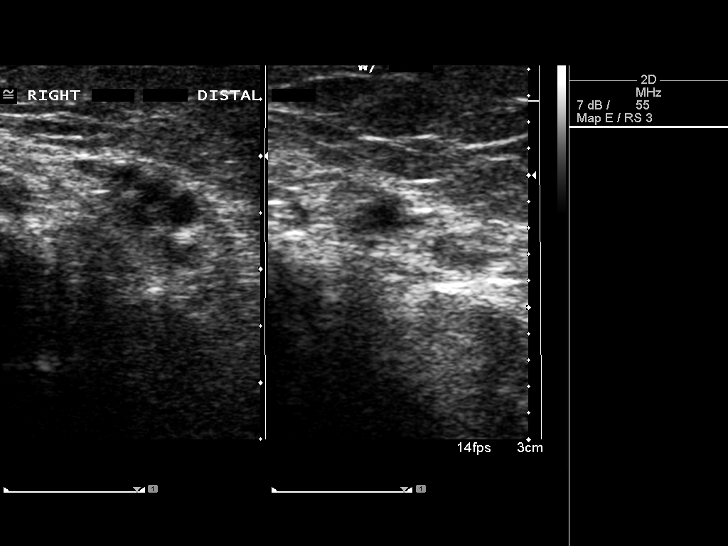
[im 14/51]
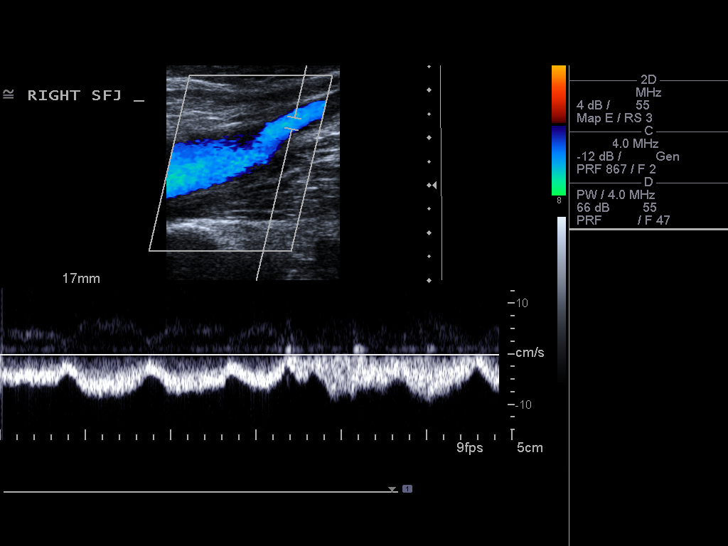
[im 18/51]
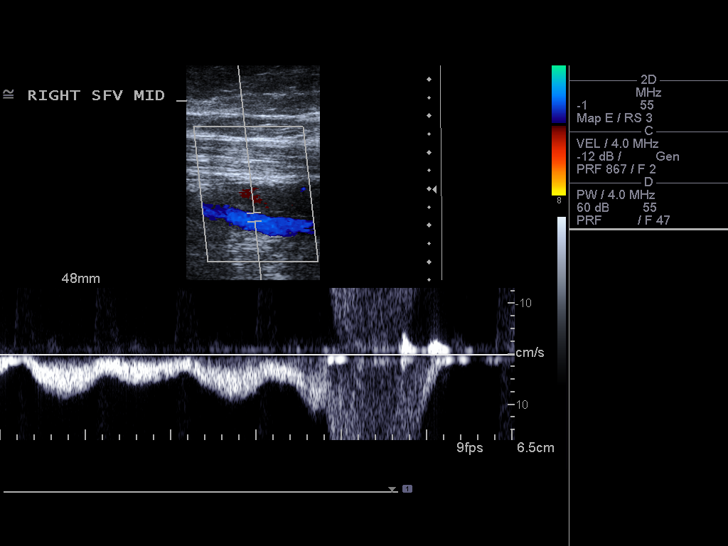
[im 22/51]
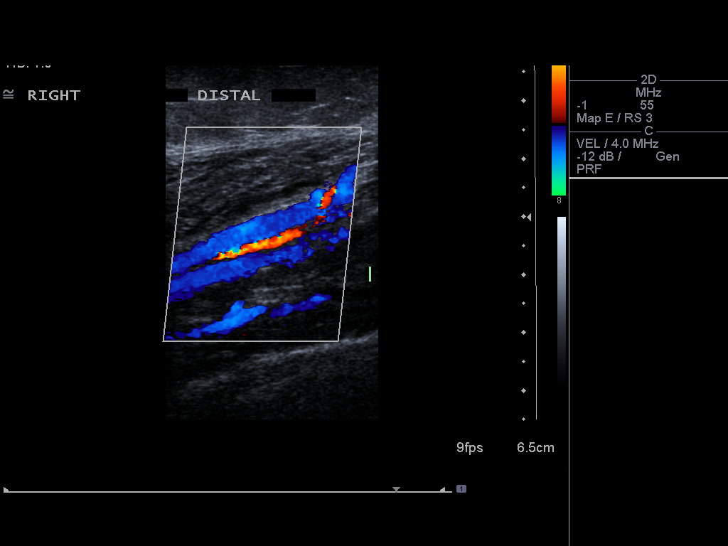
[im 29/51]
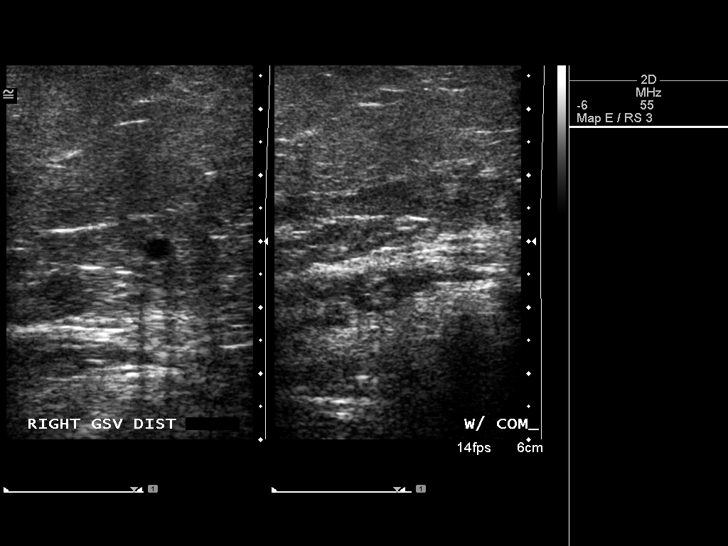
[im 31/51]
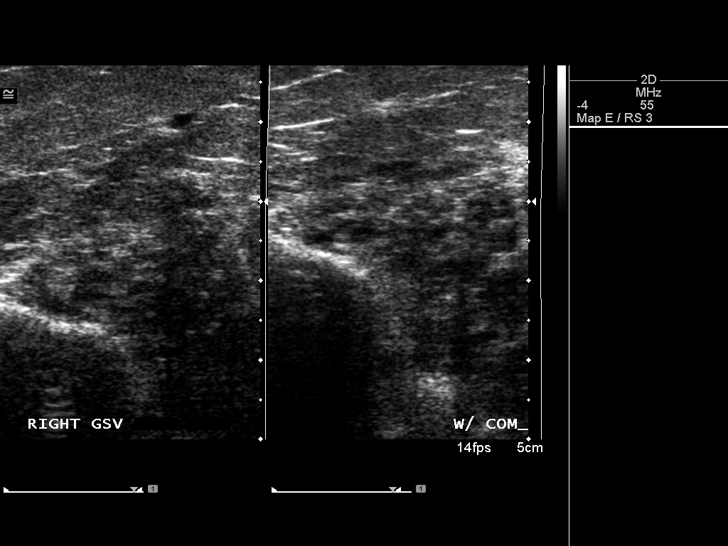
[im 35/51]
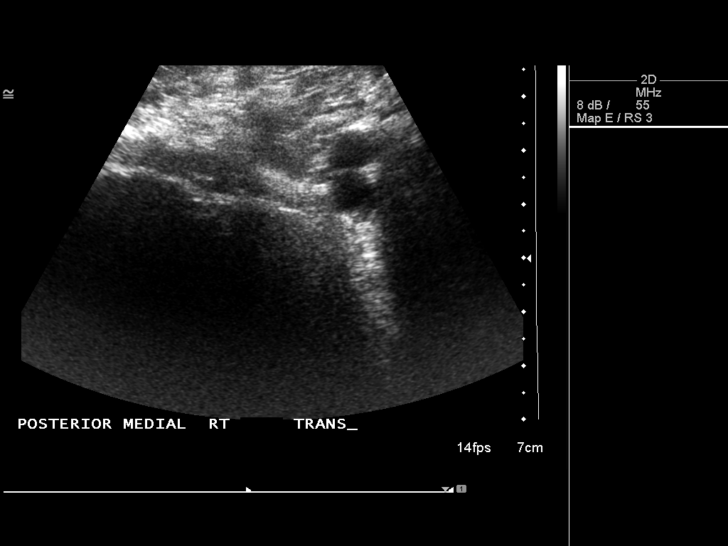
[im 40/51]
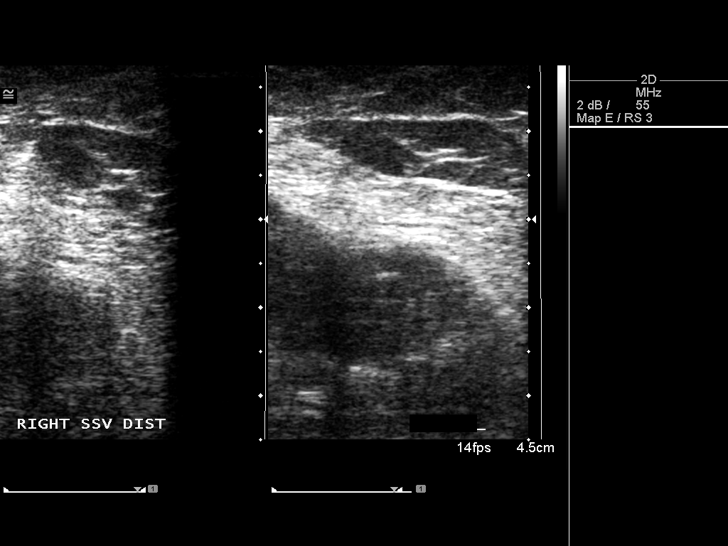
[im 44/51]
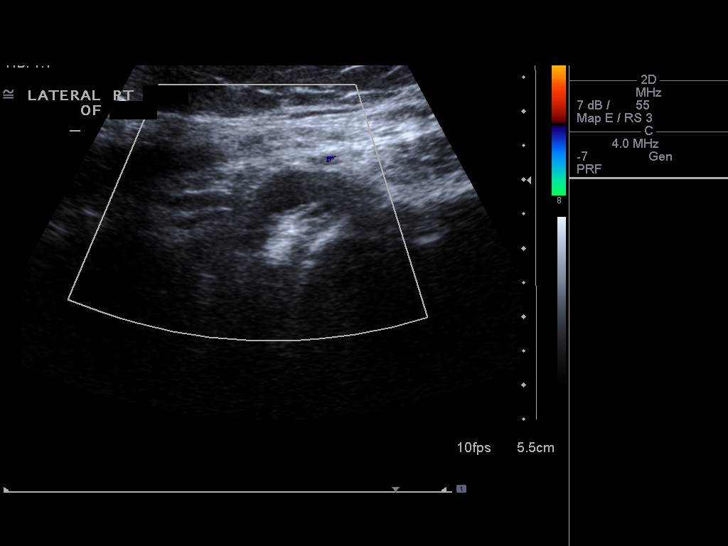
[im 46/51]
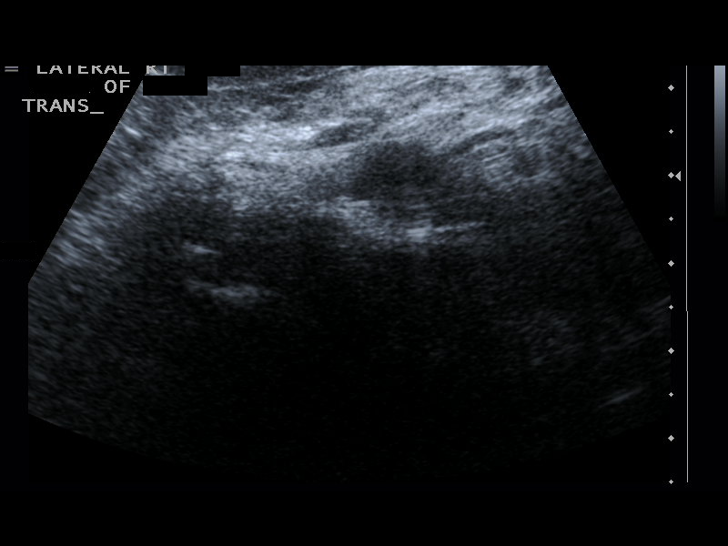
[im 51/51]
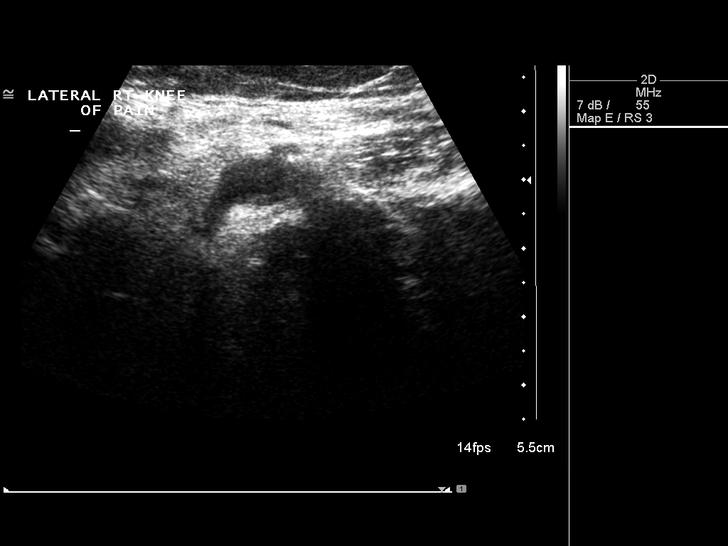

[13 of 24 positions shown; findings below may reference images not displayed]

FINDINGS: Normal compressibility, augmentation and color Doppler flow in the
right common femoral vein, right femoral vein and right popliteal
vein. The right saphenofemoral junction is patent. Visualized right
deep calf veins are patent without thrombus. Right profunda femoral
vein is patent without thrombus. Right great saphenous vein is
compressible and patent. Visualized right short saphenous vein is
patent.

Images were obtained at the area of concern along the lateral right
knee. There is a small hypoechoic area roughly measuring 1.0 x 1.1 x
0.8 cm. There is no vascular flow in this area.
IMPRESSION: Negative for deep venous thrombosis in right lower extremity.

Negative for superficial venous thrombosis in the right lower
extremity.

Nonspecific hypoechoic area at the area of concern in the lateral
right knee. This area measures up to 1.1 cm and may represent a
small fluid collection.

## 2016-08-15 DIAGNOSIS — M170 Bilateral primary osteoarthritis of knee: Secondary | ICD-10-CM | POA: Diagnosis not present

## 2016-08-15 DIAGNOSIS — M1711 Unilateral primary osteoarthritis, right knee: Secondary | ICD-10-CM | POA: Diagnosis not present

## 2016-08-30 DIAGNOSIS — E113292 Type 2 diabetes mellitus with mild nonproliferative diabetic retinopathy without macular edema, left eye: Secondary | ICD-10-CM | POA: Diagnosis not present

## 2016-08-30 DIAGNOSIS — H524 Presbyopia: Secondary | ICD-10-CM | POA: Diagnosis not present

## 2016-08-30 DIAGNOSIS — H25042 Posterior subcapsular polar age-related cataract, left eye: Secondary | ICD-10-CM | POA: Diagnosis not present

## 2016-08-30 DIAGNOSIS — H2513 Age-related nuclear cataract, bilateral: Secondary | ICD-10-CM | POA: Diagnosis not present

## 2016-09-02 ENCOUNTER — Ambulatory Visit: Payer: Medicare Other | Attending: Geriatric Medicine | Admitting: Physical Therapy

## 2016-09-02 DIAGNOSIS — M6281 Muscle weakness (generalized): Secondary | ICD-10-CM | POA: Insufficient documentation

## 2016-09-02 DIAGNOSIS — M25561 Pain in right knee: Secondary | ICD-10-CM

## 2016-09-02 DIAGNOSIS — R262 Difficulty in walking, not elsewhere classified: Secondary | ICD-10-CM | POA: Diagnosis not present

## 2016-09-02 DIAGNOSIS — M25562 Pain in left knee: Secondary | ICD-10-CM | POA: Diagnosis not present

## 2016-09-02 NOTE — Therapy (Addendum)
[place], [place], [address] Phone: [phone]   Fax:  [phone]  Physical Therapy Evaluation  Patient Details  Name: [name] [name] MRN: [id number] Date of Birth: 1945/12/21 Referring Provider: Dr [name]   Encounter Date: 09/02/2016      PT End of Session - 09/02/16 1325    Visit Number 1   Number of Visits 12   Date for PT Re-Evaluation 10/14/16   PT Start Time 1130   PT Stop Time 1217   PT Time Calculation (min) 47 min   Activity Tolerance Patient tolerated treatment well   Behavior During Therapy [facility] for tasks assessed/performed      Past Medical History:  Diagnosis Date  . Allergic rhinitis   . Bipolar disorder   . Cholelithiasis    seen on CAT 01/04/09  . Chronic renal disease, stage III    3/12  . Complication of anesthesia    versed and fentanyl did not work for colonoscopy  . DM (diabetes mellitus)   . Edema   . HLD (hyperlipidemia)   . HTN (hypertension)   . Iron deficiency anemia    3/12   . Lymphedema    tarda  . Neuropathy   . Obstructive sleep apnea on CPAP   . Osteoarthritis   . Renal cyst    2.7 cm seen on CAT 01/04/09    Past Surgical History:  Procedure Laterality Date  . ABDOMINAL HYSTERECTOMY    . COLONOSCOPY  2009; 07/09/11   2009:normal (polyp was not adenoma); 2012:   . DILATION AND CURETTAGE OF UTERUS    . ESOPHAGOGASTRODUODENOSCOPY  07/09/11   Erosive gastritis  . FRACTURE SURGERY     rt fx upper arm  . GIVENS CAPSULE STUDY  07/25/2011   normal small bowel  . REIMPLANT URETER IN BLADDER  1996  . TRIGGER FINGER RELEASE Left 02/04/2013   Procedure: RELEASE TRIGGER FINGER/A-1 PULLEY LEFT RING FINGER;  Surgeon: Tennis Must, MD;  Location: [place];  Service: Orthopedics;  Laterality: Left;    There were no vitals filed for this visit.       Subjective Assessment - 09/02/16 1141    Subjective About a month ago the patient  began having right knee pain when walking up the stairs. It was found that she has arthritis in both knees. She has lymphademia which began about 14 years prior. She is uisng a walker at this time which she did not use in the past. She will be having knee replacement but may have to wait some time. She is having the replacements at [facility].    Patient is accompained by: Family member   Limitations Standing;Walking;House hold activities   How long can you stand comfortably? < 10 minutes    How long can you walk comfortably? Limited walking distances    Diagnostic tests X-rays: bilateral sever tri-compartmental OA    Patient Stated Goals get back to exercise. Improve strength prior to surgeries   Currently in Pain? Yes   Pain Score 3   Pain has reached a 9/10    Pain Location Knee   Pain Orientation Right   Pain Descriptors / Indicators Sharp   Pain Type Acute pain  Acute onset of pain with chronic degeneration    Pain Onset 1 to 4 weeks ago   Pain Frequency Several days a week   Aggravating Factors  standing, walking, household activity    Pain Relieving Factors  rest    Effect of Pain on Daily Activities Difficulty perfroming ADL's             [facility] PT Assessment - 09/02/16 0001      Assessment   Medical Diagnosis Bilateral knee OA    Referring Provider Dr [name]    Onset Date/Surgical Date --  > 1 month prior    Hand Dominance Right   Next MD Visit End of Novemeber    Prior Therapy None      Precautions   Precautions None     Restrictions   Weight Bearing Restrictions No     Balance Screen   Has the patient fallen in the past 6 months No     [place] residence   Living Arrangements Spouse/significant other   Available Help at Discharge Family   Type of [place] to enter   Entrance Stairs-Number of Steps 6   Entrance Stairs-Rails Can reach both   [place] Two level   Alternate Level Stairs-Number  of Steps 12   Alternate Level Stairs-Rails Can reach both   Additional Comments Difficulty getting onto her high bed      Prior Function   Level of Independence Independent   Vocation Retired   Museum/gallery curator (retired)    Leisure Arts development officer   Overall Cognitive Status Within Functional Limits for tasks assessed   Attention Focused   Focused Attention Appears intact   Memory Appears intact   Awareness Appears intact   Problem Solving Appears intact     Observation/Other Assessments   Observations Walking with walker; bilateral toe out, swelling int the righ tuper extremity     Observation/Other Assessments-Edema    Edema Circumferential     Circumferential Edema   Circumferential - Right 51.2cm    Circumferential - Left  49 cm      Sensation   Light Touch Appears Intact     Coordination   Gross Motor Movements are Fluid and Coordinated Yes     ROM / Strength   AROM / PROM / Strength AROM;PROM;Strength     AROM   Overall AROM Comments pain with end range flexion    AROM Assessment Site Knee   Right/Left Knee Right;Left   Right Knee Extension -10   Right Knee Flexion 120   Left Knee Extension 0   Left Knee Flexion 123     PROM   PROM Assessment Site Knee   Right/Left Knee Right;Left   Right Knee Extension -10   Right Knee Flexion 122   Left Knee Extension 0   Left Knee Flexion 120     Strength   Strength Assessment Site Knee;Hip   Right/Left Hip Right;Left   Right Hip Flexion 4+/5   Right Hip ABduction 5/5   Right Hip ADduction 5/5   Left Hip Flexion 4+/5   Left Hip ABduction 5/5   Left Hip ADduction 5/5   Right/Left Knee Right;Left   Right Knee Flexion 5/5   Right Knee Extension 5/5   Left Knee Flexion 5/5   Left Knee Extension 5/5     Palpation   Palpation comment No significant tenderness to pallpation      Special Tests    Special Tests --  Patellar crepitus wih patellar compression L > R  PT Education - 09/13/16 1137    Education provided Yes   Education Details Given basic HEP; reviewed symptom mangement.    Person(s) Educated Patient   Methods Explanation   Comprehension Verbalized understanding;Returned demonstration          PT Short Term Goals - 2016-09-13 1617      PT SHORT TERM GOAL #1   Title Patient will be independent with initial HEP    Time 3   Period Weeks   Status New     PT SHORT TERM GOAL #2   Title Patient will demsotrate 5/5 gross bilateral lower extremity strength    Time 3   Period Weeks   Status New           PT Long Term Goals - 13-Sep-2016 1618      PT LONG TERM GOAL #1   Title Patient will re-state the improtnac of symptom mangement   Time 6   Period Weeks   Status New     PT LONG TERM GOAL #2   Title Patient will be Independent with a plan that will improve her strength prior to total knee surgery    Time 6   Period Weeks   Status New               Plan - Sep 13, 2016 1327    Clinical Impression Statement Patient is a 70 year old female with significant tri-compartmental arthritis. She has pain inly on the right but it has reached a 9/10. She had been doing water walking prior to onset of pain. Her activity level ahs decreased since that point. She is currently on a walker but would like to transition to a cane if able. She has lymphadema which she has been dealing with for several years. She would benefit from furter skilled therapy to improve strength and mobility before surgery.    PT Frequency 2x / week   PT Duration 6 weeks   PT Treatment/Interventions ADLs/Self Care Home Management;Cryotherapy;Electrical Stimulation;Iontophoresis 92m/ml Dexamethasone;Gait training;Stair training;Ultrasound;Traction;Moist Heat;Therapeutic activities;Therapeutic exercise;Neuromuscular re-education;Cognitive remediation;Patient/family education;Orthotic Fit/Training;Passive range of motion;Manual  techniques;Dry needling;Energy conservation   PT Next Visit Plan Assess tolerance to HEP. Add supine hip flexion, Supine clamshell, seated LAQ in pain free range; seated ball squeeze. The patient has severe arthritis. Add watever exercises she can tolerate without causing increased pain. Patient would also like to work on cane training;    PT Home Exercise Plan quad sets, heel slides, SAQ, SLR patient also given 3 way hip for the pool    Consulted and Agree with Plan of Care Patient      Patient will benefit from skilled therapeutic intervention in order to improve the following deficits and impairments:  Abnormal gait, Difficulty walking, Pain, Decreased strength, Decreased mobility, Increased edema  Visit Diagnosis: Acute pain of left knee  Acute pain of right knee  Muscle weakness (generalized)  Difficulty in walking, not elsewhere classified      G-Codes - 110/27/171224    Functional Assessment Tool Used FOTO clinical decision making    Functional Limitation Mobility: Walking and moving around   Mobility: Walking and Moving Around Current Status [phone] At least 60 percent but less than 80 percent impaired, limited or restricted   Mobility: Walking and Moving Around Goal Status ([phone] At least 40 percent but less than 60 percent impaired, limited or restricted     PHYSICAL THERAPY DISCHARGE SUMMARY  Visits from Start of Care: 1  Current functional level related to goals /  functional outcomes: Patient only came for 1 visit   Remaining deficits: Unknown    Education / Equipment: Basic HEP   Plan: Patient agrees to discharge.  Patient goals were not met. Patient is being discharged due to not returning since the last visit.  ?????      Problem List Patient Active Problem List   Diagnosis Date Noted  . Unspecified gastritis and gastroduodenitis without mention of hemorrhage 07/09/2011  . Personal history of failed moderate sedation 05/20/2011  . ACUTE CYSTITIS  02/23/2008  . HYPERLIPIDEMIA 10/30/2007  . DIABETES MELLITUS, TYPE II 07/08/2007  . Iron deficiency anemia, unspecified 07/08/2007  . DEPRESSION 07/08/2007  . HYPERTENSION 07/08/2007    Carney Living 09/02/2016, 4:23 PM  [facility] [address] [place], [place], [address] Phone: [phone]   Fax:  [phone]  Name: [name] [name] MRN: [id number] Date of Birth: [birth year]

## 2016-09-05 DIAGNOSIS — F3181 Bipolar II disorder: Secondary | ICD-10-CM | POA: Diagnosis not present

## 2016-09-05 DIAGNOSIS — F4321 Adjustment disorder with depressed mood: Secondary | ICD-10-CM | POA: Diagnosis not present

## 2016-09-12 ENCOUNTER — Ambulatory Visit: Payer: Medicare Other | Admitting: Physical Therapy

## 2016-09-16 DIAGNOSIS — F3181 Bipolar II disorder: Secondary | ICD-10-CM | POA: Diagnosis not present

## 2016-09-16 DIAGNOSIS — F4321 Adjustment disorder with depressed mood: Secondary | ICD-10-CM | POA: Diagnosis not present

## 2016-09-24 DIAGNOSIS — H2512 Age-related nuclear cataract, left eye: Secondary | ICD-10-CM | POA: Diagnosis not present

## 2016-09-24 DIAGNOSIS — H25042 Posterior subcapsular polar age-related cataract, left eye: Secondary | ICD-10-CM | POA: Diagnosis not present

## 2016-09-24 DIAGNOSIS — H25812 Combined forms of age-related cataract, left eye: Secondary | ICD-10-CM | POA: Diagnosis not present

## 2016-10-02 DIAGNOSIS — F4321 Adjustment disorder with depressed mood: Secondary | ICD-10-CM | POA: Diagnosis not present

## 2016-10-02 DIAGNOSIS — F3181 Bipolar II disorder: Secondary | ICD-10-CM | POA: Diagnosis not present

## 2016-10-07 DIAGNOSIS — F3181 Bipolar II disorder: Secondary | ICD-10-CM | POA: Diagnosis not present

## 2016-10-17 DIAGNOSIS — I129 Hypertensive chronic kidney disease with stage 1 through stage 4 chronic kidney disease, or unspecified chronic kidney disease: Secondary | ICD-10-CM | POA: Diagnosis not present

## 2016-10-17 DIAGNOSIS — Z7984 Long term (current) use of oral hypoglycemic drugs: Secondary | ICD-10-CM | POA: Diagnosis not present

## 2016-10-17 DIAGNOSIS — I890 Lymphedema, not elsewhere classified: Secondary | ICD-10-CM | POA: Diagnosis not present

## 2016-10-17 DIAGNOSIS — E113293 Type 2 diabetes mellitus with mild nonproliferative diabetic retinopathy without macular edema, bilateral: Secondary | ICD-10-CM | POA: Diagnosis not present

## 2016-10-17 DIAGNOSIS — Z23 Encounter for immunization: Secondary | ICD-10-CM | POA: Diagnosis not present

## 2016-10-17 DIAGNOSIS — Z79899 Other long term (current) drug therapy: Secondary | ICD-10-CM | POA: Diagnosis not present

## 2016-10-17 DIAGNOSIS — Z0000 Encounter for general adult medical examination without abnormal findings: Secondary | ICD-10-CM | POA: Diagnosis not present

## 2016-10-17 DIAGNOSIS — D692 Other nonthrombocytopenic purpura: Secondary | ICD-10-CM | POA: Diagnosis not present

## 2016-10-17 DIAGNOSIS — F319 Bipolar disorder, unspecified: Secondary | ICD-10-CM | POA: Diagnosis not present

## 2016-10-17 DIAGNOSIS — E1140 Type 2 diabetes mellitus with diabetic neuropathy, unspecified: Secondary | ICD-10-CM | POA: Diagnosis not present

## 2016-10-17 DIAGNOSIS — E7800 Pure hypercholesterolemia, unspecified: Secondary | ICD-10-CM | POA: Diagnosis not present

## 2016-10-17 DIAGNOSIS — E1121 Type 2 diabetes mellitus with diabetic nephropathy: Secondary | ICD-10-CM | POA: Diagnosis not present

## 2016-10-17 DIAGNOSIS — G4733 Obstructive sleep apnea (adult) (pediatric): Secondary | ICD-10-CM | POA: Diagnosis not present

## 2016-10-17 DIAGNOSIS — E119 Type 2 diabetes mellitus without complications: Secondary | ICD-10-CM | POA: Diagnosis not present

## 2016-10-17 DIAGNOSIS — N183 Chronic kidney disease, stage 3 (moderate): Secondary | ICD-10-CM | POA: Diagnosis not present

## 2016-10-29 DIAGNOSIS — H25811 Combined forms of age-related cataract, right eye: Secondary | ICD-10-CM | POA: Diagnosis not present

## 2016-10-29 DIAGNOSIS — H2511 Age-related nuclear cataract, right eye: Secondary | ICD-10-CM | POA: Diagnosis not present

## 2016-10-29 DIAGNOSIS — H25011 Cortical age-related cataract, right eye: Secondary | ICD-10-CM | POA: Diagnosis not present

## 2016-10-30 DIAGNOSIS — F3181 Bipolar II disorder: Secondary | ICD-10-CM | POA: Diagnosis not present

## 2016-10-30 DIAGNOSIS — F4321 Adjustment disorder with depressed mood: Secondary | ICD-10-CM | POA: Diagnosis not present

## 2016-11-05 DIAGNOSIS — G4733 Obstructive sleep apnea (adult) (pediatric): Secondary | ICD-10-CM | POA: Diagnosis not present

## 2016-11-19 DIAGNOSIS — F3181 Bipolar II disorder: Secondary | ICD-10-CM | POA: Diagnosis not present

## 2016-11-19 DIAGNOSIS — F4321 Adjustment disorder with depressed mood: Secondary | ICD-10-CM | POA: Diagnosis not present

## 2016-12-02 DIAGNOSIS — F3181 Bipolar II disorder: Secondary | ICD-10-CM | POA: Diagnosis not present

## 2016-12-11 DIAGNOSIS — M170 Bilateral primary osteoarthritis of knee: Secondary | ICD-10-CM | POA: Diagnosis not present

## 2016-12-16 DIAGNOSIS — F3181 Bipolar II disorder: Secondary | ICD-10-CM | POA: Diagnosis not present

## 2016-12-30 DIAGNOSIS — F3181 Bipolar II disorder: Secondary | ICD-10-CM | POA: Diagnosis not present

## 2016-12-31 DIAGNOSIS — J343 Hypertrophy of nasal turbinates: Secondary | ICD-10-CM | POA: Diagnosis not present

## 2016-12-31 DIAGNOSIS — J310 Chronic rhinitis: Secondary | ICD-10-CM | POA: Diagnosis not present

## 2016-12-31 DIAGNOSIS — G4733 Obstructive sleep apnea (adult) (pediatric): Secondary | ICD-10-CM | POA: Diagnosis not present

## 2017-01-13 DIAGNOSIS — F3181 Bipolar II disorder: Secondary | ICD-10-CM | POA: Diagnosis not present

## 2017-01-23 DIAGNOSIS — M170 Bilateral primary osteoarthritis of knee: Secondary | ICD-10-CM | POA: Diagnosis not present

## 2017-01-23 DIAGNOSIS — Z01818 Encounter for other preprocedural examination: Secondary | ICD-10-CM | POA: Diagnosis not present

## 2017-01-27 DIAGNOSIS — F3181 Bipolar II disorder: Secondary | ICD-10-CM | POA: Diagnosis not present

## 2017-01-27 DIAGNOSIS — Z79899 Other long term (current) drug therapy: Secondary | ICD-10-CM | POA: Diagnosis not present

## 2017-02-10 DIAGNOSIS — F3181 Bipolar II disorder: Secondary | ICD-10-CM | POA: Diagnosis not present

## 2017-02-13 DIAGNOSIS — M24561 Contracture, right knee: Secondary | ICD-10-CM | POA: Diagnosis not present

## 2017-02-13 DIAGNOSIS — E1122 Type 2 diabetes mellitus with diabetic chronic kidney disease: Secondary | ICD-10-CM | POA: Diagnosis not present

## 2017-02-13 DIAGNOSIS — M21161 Varus deformity, not elsewhere classified, right knee: Secondary | ICD-10-CM | POA: Diagnosis not present

## 2017-02-13 DIAGNOSIS — Z9889 Other specified postprocedural states: Secondary | ICD-10-CM | POA: Diagnosis not present

## 2017-02-13 DIAGNOSIS — I129 Hypertensive chronic kidney disease with stage 1 through stage 4 chronic kidney disease, or unspecified chronic kidney disease: Secondary | ICD-10-CM | POA: Diagnosis not present

## 2017-02-13 DIAGNOSIS — Z90710 Acquired absence of both cervix and uterus: Secondary | ICD-10-CM | POA: Diagnosis not present

## 2017-02-13 DIAGNOSIS — M1711 Unilateral primary osteoarthritis, right knee: Secondary | ICD-10-CM | POA: Diagnosis not present

## 2017-02-13 DIAGNOSIS — Z79899 Other long term (current) drug therapy: Secondary | ICD-10-CM | POA: Diagnosis not present

## 2017-02-13 DIAGNOSIS — E11319 Type 2 diabetes mellitus with unspecified diabetic retinopathy without macular edema: Secondary | ICD-10-CM | POA: Diagnosis not present

## 2017-02-13 DIAGNOSIS — E1121 Type 2 diabetes mellitus with diabetic nephropathy: Secondary | ICD-10-CM | POA: Diagnosis not present

## 2017-02-13 DIAGNOSIS — F419 Anxiety disorder, unspecified: Secondary | ICD-10-CM | POA: Diagnosis not present

## 2017-02-13 DIAGNOSIS — Z87891 Personal history of nicotine dependence: Secondary | ICD-10-CM | POA: Diagnosis not present

## 2017-02-13 DIAGNOSIS — Z780 Asymptomatic menopausal state: Secondary | ICD-10-CM | POA: Diagnosis not present

## 2017-02-13 DIAGNOSIS — G4730 Sleep apnea, unspecified: Secondary | ICD-10-CM | POA: Diagnosis not present

## 2017-02-13 DIAGNOSIS — G8918 Other acute postprocedural pain: Secondary | ICD-10-CM | POA: Diagnosis not present

## 2017-02-13 DIAGNOSIS — N183 Chronic kidney disease, stage 3 (moderate): Secondary | ICD-10-CM | POA: Diagnosis not present

## 2017-02-13 DIAGNOSIS — I890 Lymphedema, not elsewhere classified: Secondary | ICD-10-CM | POA: Diagnosis not present

## 2017-02-13 DIAGNOSIS — F319 Bipolar disorder, unspecified: Secondary | ICD-10-CM | POA: Diagnosis not present

## 2017-02-13 DIAGNOSIS — E785 Hyperlipidemia, unspecified: Secondary | ICD-10-CM | POA: Diagnosis not present

## 2017-02-13 DIAGNOSIS — E1142 Type 2 diabetes mellitus with diabetic polyneuropathy: Secondary | ICD-10-CM | POA: Diagnosis not present

## 2017-02-14 DIAGNOSIS — E11319 Type 2 diabetes mellitus with unspecified diabetic retinopathy without macular edema: Secondary | ICD-10-CM | POA: Diagnosis not present

## 2017-02-14 DIAGNOSIS — G8918 Other acute postprocedural pain: Secondary | ICD-10-CM | POA: Diagnosis not present

## 2017-02-14 DIAGNOSIS — E1121 Type 2 diabetes mellitus with diabetic nephropathy: Secondary | ICD-10-CM | POA: Diagnosis not present

## 2017-02-14 DIAGNOSIS — E1142 Type 2 diabetes mellitus with diabetic polyneuropathy: Secondary | ICD-10-CM | POA: Diagnosis not present

## 2017-02-14 DIAGNOSIS — M1711 Unilateral primary osteoarthritis, right knee: Secondary | ICD-10-CM | POA: Diagnosis not present

## 2017-02-14 DIAGNOSIS — M25561 Pain in right knee: Secondary | ICD-10-CM | POA: Diagnosis not present

## 2017-02-14 DIAGNOSIS — E1122 Type 2 diabetes mellitus with diabetic chronic kidney disease: Secondary | ICD-10-CM | POA: Diagnosis not present

## 2017-02-14 DIAGNOSIS — I129 Hypertensive chronic kidney disease with stage 1 through stage 4 chronic kidney disease, or unspecified chronic kidney disease: Secondary | ICD-10-CM | POA: Diagnosis not present

## 2017-02-17 DIAGNOSIS — M25561 Pain in right knee: Secondary | ICD-10-CM | POA: Diagnosis not present

## 2017-02-21 DIAGNOSIS — M25561 Pain in right knee: Secondary | ICD-10-CM | POA: Diagnosis not present

## 2017-02-25 DIAGNOSIS — M25561 Pain in right knee: Secondary | ICD-10-CM | POA: Diagnosis not present

## 2017-02-28 DIAGNOSIS — M25561 Pain in right knee: Secondary | ICD-10-CM | POA: Diagnosis not present

## 2017-03-04 DIAGNOSIS — M25561 Pain in right knee: Secondary | ICD-10-CM | POA: Diagnosis not present

## 2017-03-06 DIAGNOSIS — M25561 Pain in right knee: Secondary | ICD-10-CM | POA: Diagnosis not present

## 2017-03-06 DIAGNOSIS — F3181 Bipolar II disorder: Secondary | ICD-10-CM | POA: Diagnosis not present

## 2017-03-10 DIAGNOSIS — M25561 Pain in right knee: Secondary | ICD-10-CM | POA: Diagnosis not present

## 2017-03-13 DIAGNOSIS — M25561 Pain in right knee: Secondary | ICD-10-CM | POA: Diagnosis not present

## 2017-03-17 DIAGNOSIS — M25561 Pain in right knee: Secondary | ICD-10-CM | POA: Diagnosis not present

## 2017-03-18 DIAGNOSIS — G4733 Obstructive sleep apnea (adult) (pediatric): Secondary | ICD-10-CM | POA: Diagnosis not present

## 2017-03-18 DIAGNOSIS — J310 Chronic rhinitis: Secondary | ICD-10-CM | POA: Diagnosis not present

## 2017-03-18 DIAGNOSIS — J343 Hypertrophy of nasal turbinates: Secondary | ICD-10-CM | POA: Diagnosis not present

## 2017-03-19 DIAGNOSIS — M1711 Unilateral primary osteoarthritis, right knee: Secondary | ICD-10-CM | POA: Diagnosis not present

## 2017-03-19 DIAGNOSIS — Z96651 Presence of right artificial knee joint: Secondary | ICD-10-CM | POA: Diagnosis not present

## 2017-03-21 DIAGNOSIS — M25561 Pain in right knee: Secondary | ICD-10-CM | POA: Diagnosis not present

## 2017-03-24 DIAGNOSIS — M25561 Pain in right knee: Secondary | ICD-10-CM | POA: Diagnosis not present

## 2017-03-26 DIAGNOSIS — M25561 Pain in right knee: Secondary | ICD-10-CM | POA: Diagnosis not present

## 2017-04-01 DIAGNOSIS — S41112A Laceration without foreign body of left upper arm, initial encounter: Secondary | ICD-10-CM | POA: Diagnosis not present

## 2017-04-08 DIAGNOSIS — I129 Hypertensive chronic kidney disease with stage 1 through stage 4 chronic kidney disease, or unspecified chronic kidney disease: Secondary | ICD-10-CM | POA: Diagnosis not present

## 2017-04-08 DIAGNOSIS — N183 Chronic kidney disease, stage 3 (moderate): Secondary | ICD-10-CM | POA: Diagnosis not present

## 2017-04-08 DIAGNOSIS — E1142 Type 2 diabetes mellitus with diabetic polyneuropathy: Secondary | ICD-10-CM | POA: Diagnosis not present

## 2017-04-08 DIAGNOSIS — Z7984 Long term (current) use of oral hypoglycemic drugs: Secondary | ICD-10-CM | POA: Diagnosis not present

## 2017-04-08 DIAGNOSIS — S41112A Laceration without foreign body of left upper arm, initial encounter: Secondary | ICD-10-CM | POA: Diagnosis not present

## 2017-04-17 DIAGNOSIS — F3181 Bipolar II disorder: Secondary | ICD-10-CM | POA: Diagnosis not present

## 2017-04-29 DIAGNOSIS — F3181 Bipolar II disorder: Secondary | ICD-10-CM | POA: Diagnosis not present

## 2017-05-08 DIAGNOSIS — F3181 Bipolar II disorder: Secondary | ICD-10-CM | POA: Diagnosis not present

## 2017-05-20 DIAGNOSIS — N183 Chronic kidney disease, stage 3 (moderate): Secondary | ICD-10-CM | POA: Diagnosis not present

## 2017-05-20 DIAGNOSIS — L989 Disorder of the skin and subcutaneous tissue, unspecified: Secondary | ICD-10-CM | POA: Diagnosis not present

## 2017-05-20 DIAGNOSIS — E1140 Type 2 diabetes mellitus with diabetic neuropathy, unspecified: Secondary | ICD-10-CM | POA: Diagnosis not present

## 2017-05-20 DIAGNOSIS — I129 Hypertensive chronic kidney disease with stage 1 through stage 4 chronic kidney disease, or unspecified chronic kidney disease: Secondary | ICD-10-CM | POA: Diagnosis not present

## 2017-05-20 DIAGNOSIS — E1121 Type 2 diabetes mellitus with diabetic nephropathy: Secondary | ICD-10-CM | POA: Diagnosis not present

## 2017-05-20 DIAGNOSIS — E113293 Type 2 diabetes mellitus with mild nonproliferative diabetic retinopathy without macular edema, bilateral: Secondary | ICD-10-CM | POA: Diagnosis not present

## 2017-05-28 DIAGNOSIS — F3181 Bipolar II disorder: Secondary | ICD-10-CM | POA: Diagnosis not present

## 2017-06-11 DIAGNOSIS — F3181 Bipolar II disorder: Secondary | ICD-10-CM | POA: Diagnosis not present

## 2017-07-04 DIAGNOSIS — L570 Actinic keratosis: Secondary | ICD-10-CM | POA: Diagnosis not present

## 2017-07-09 DIAGNOSIS — F3181 Bipolar II disorder: Secondary | ICD-10-CM | POA: Diagnosis not present

## 2017-07-22 DIAGNOSIS — F3181 Bipolar II disorder: Secondary | ICD-10-CM | POA: Diagnosis not present

## 2017-08-08 DIAGNOSIS — F3181 Bipolar II disorder: Secondary | ICD-10-CM | POA: Diagnosis not present

## 2017-08-21 DIAGNOSIS — F3181 Bipolar II disorder: Secondary | ICD-10-CM | POA: Diagnosis not present

## 2017-09-04 DIAGNOSIS — F3181 Bipolar II disorder: Secondary | ICD-10-CM | POA: Diagnosis not present

## 2017-09-18 DIAGNOSIS — F3181 Bipolar II disorder: Secondary | ICD-10-CM | POA: Diagnosis not present

## 2017-10-02 DIAGNOSIS — F3181 Bipolar II disorder: Secondary | ICD-10-CM | POA: Diagnosis not present

## 2017-10-16 DIAGNOSIS — F3181 Bipolar II disorder: Secondary | ICD-10-CM | POA: Diagnosis not present

## 2017-11-04 DIAGNOSIS — F3181 Bipolar II disorder: Secondary | ICD-10-CM | POA: Diagnosis not present

## 2017-11-06 DIAGNOSIS — G4733 Obstructive sleep apnea (adult) (pediatric): Secondary | ICD-10-CM | POA: Diagnosis not present

## 2017-11-06 DIAGNOSIS — Z23 Encounter for immunization: Secondary | ICD-10-CM | POA: Diagnosis not present

## 2017-11-20 DIAGNOSIS — F3181 Bipolar II disorder: Secondary | ICD-10-CM | POA: Diagnosis not present

## 2017-11-26 DIAGNOSIS — N189 Chronic kidney disease, unspecified: Secondary | ICD-10-CM | POA: Diagnosis not present

## 2017-11-26 DIAGNOSIS — F418 Other specified anxiety disorders: Secondary | ICD-10-CM | POA: Diagnosis not present

## 2017-11-26 DIAGNOSIS — I890 Lymphedema, not elsewhere classified: Secondary | ICD-10-CM | POA: Diagnosis not present

## 2017-11-26 DIAGNOSIS — F319 Bipolar disorder, unspecified: Secondary | ICD-10-CM | POA: Diagnosis not present

## 2017-11-26 DIAGNOSIS — G4730 Sleep apnea, unspecified: Secondary | ICD-10-CM | POA: Diagnosis not present

## 2017-11-26 DIAGNOSIS — E118 Type 2 diabetes mellitus with unspecified complications: Secondary | ICD-10-CM | POA: Diagnosis not present

## 2017-11-26 DIAGNOSIS — D649 Anemia, unspecified: Secondary | ICD-10-CM | POA: Diagnosis not present

## 2017-11-26 DIAGNOSIS — I10 Essential (primary) hypertension: Secondary | ICD-10-CM | POA: Diagnosis not present

## 2017-12-03 DIAGNOSIS — F3181 Bipolar II disorder: Secondary | ICD-10-CM | POA: Diagnosis not present

## 2017-12-18 DIAGNOSIS — F319 Bipolar disorder, unspecified: Secondary | ICD-10-CM | POA: Diagnosis present

## 2017-12-18 DIAGNOSIS — E1121 Type 2 diabetes mellitus with diabetic nephropathy: Secondary | ICD-10-CM | POA: Diagnosis present

## 2017-12-18 DIAGNOSIS — G4730 Sleep apnea, unspecified: Secondary | ICD-10-CM | POA: Diagnosis present

## 2017-12-18 DIAGNOSIS — Z6836 Body mass index (BMI) 36.0-36.9, adult: Secondary | ICD-10-CM | POA: Diagnosis not present

## 2017-12-18 DIAGNOSIS — E669 Obesity, unspecified: Secondary | ICD-10-CM | POA: Diagnosis present

## 2017-12-18 DIAGNOSIS — Z833 Family history of diabetes mellitus: Secondary | ICD-10-CM | POA: Diagnosis not present

## 2017-12-18 DIAGNOSIS — Z87891 Personal history of nicotine dependence: Secondary | ICD-10-CM | POA: Diagnosis not present

## 2017-12-18 DIAGNOSIS — E11319 Type 2 diabetes mellitus with unspecified diabetic retinopathy without macular edema: Secondary | ICD-10-CM | POA: Diagnosis present

## 2017-12-18 DIAGNOSIS — M21162 Varus deformity, not elsewhere classified, left knee: Secondary | ICD-10-CM | POA: Diagnosis present

## 2017-12-18 DIAGNOSIS — I129 Hypertensive chronic kidney disease with stage 1 through stage 4 chronic kidney disease, or unspecified chronic kidney disease: Secondary | ICD-10-CM | POA: Diagnosis present

## 2017-12-18 DIAGNOSIS — M1712 Unilateral primary osteoarthritis, left knee: Secondary | ICD-10-CM | POA: Diagnosis present

## 2017-12-18 DIAGNOSIS — E1140 Type 2 diabetes mellitus with diabetic neuropathy, unspecified: Secondary | ICD-10-CM | POA: Diagnosis present

## 2017-12-18 DIAGNOSIS — N183 Chronic kidney disease, stage 3 (moderate): Secondary | ICD-10-CM | POA: Diagnosis present

## 2017-12-18 DIAGNOSIS — G8918 Other acute postprocedural pain: Secondary | ICD-10-CM | POA: Diagnosis not present

## 2017-12-18 DIAGNOSIS — N189 Chronic kidney disease, unspecified: Secondary | ICD-10-CM | POA: Diagnosis not present

## 2017-12-18 DIAGNOSIS — E785 Hyperlipidemia, unspecified: Secondary | ICD-10-CM | POA: Diagnosis present

## 2017-12-18 DIAGNOSIS — Z7984 Long term (current) use of oral hypoglycemic drugs: Secondary | ICD-10-CM | POA: Diagnosis not present

## 2017-12-18 DIAGNOSIS — Z8249 Family history of ischemic heart disease and other diseases of the circulatory system: Secondary | ICD-10-CM | POA: Diagnosis not present

## 2017-12-18 DIAGNOSIS — E1122 Type 2 diabetes mellitus with diabetic chronic kidney disease: Secondary | ICD-10-CM | POA: Diagnosis present

## 2017-12-18 DIAGNOSIS — Z96651 Presence of right artificial knee joint: Secondary | ICD-10-CM | POA: Diagnosis present

## 2017-12-18 DIAGNOSIS — F419 Anxiety disorder, unspecified: Secondary | ICD-10-CM | POA: Diagnosis present

## 2017-12-23 DIAGNOSIS — M25562 Pain in left knee: Secondary | ICD-10-CM | POA: Diagnosis not present

## 2017-12-25 DIAGNOSIS — M25562 Pain in left knee: Secondary | ICD-10-CM | POA: Diagnosis not present

## 2017-12-30 DIAGNOSIS — M25562 Pain in left knee: Secondary | ICD-10-CM | POA: Diagnosis not present

## 2018-01-02 DIAGNOSIS — M25562 Pain in left knee: Secondary | ICD-10-CM | POA: Diagnosis not present

## 2018-01-06 DIAGNOSIS — Z1389 Encounter for screening for other disorder: Secondary | ICD-10-CM | POA: Diagnosis not present

## 2018-01-06 DIAGNOSIS — Z7984 Long term (current) use of oral hypoglycemic drugs: Secondary | ICD-10-CM | POA: Diagnosis not present

## 2018-01-06 DIAGNOSIS — M25562 Pain in left knee: Secondary | ICD-10-CM | POA: Diagnosis not present

## 2018-01-06 DIAGNOSIS — I129 Hypertensive chronic kidney disease with stage 1 through stage 4 chronic kidney disease, or unspecified chronic kidney disease: Secondary | ICD-10-CM | POA: Diagnosis not present

## 2018-01-06 DIAGNOSIS — E1121 Type 2 diabetes mellitus with diabetic nephropathy: Secondary | ICD-10-CM | POA: Diagnosis not present

## 2018-01-06 DIAGNOSIS — N183 Chronic kidney disease, stage 3 (moderate): Secondary | ICD-10-CM | POA: Diagnosis not present

## 2018-01-06 DIAGNOSIS — I890 Lymphedema, not elsewhere classified: Secondary | ICD-10-CM | POA: Diagnosis not present

## 2018-01-06 DIAGNOSIS — E1140 Type 2 diabetes mellitus with diabetic neuropathy, unspecified: Secondary | ICD-10-CM | POA: Diagnosis not present

## 2018-01-06 DIAGNOSIS — Z0000 Encounter for general adult medical examination without abnormal findings: Secondary | ICD-10-CM | POA: Diagnosis not present

## 2018-01-08 DIAGNOSIS — F3181 Bipolar II disorder: Secondary | ICD-10-CM | POA: Diagnosis not present

## 2018-01-08 DIAGNOSIS — M25562 Pain in left knee: Secondary | ICD-10-CM | POA: Diagnosis not present

## 2018-01-13 DIAGNOSIS — M25562 Pain in left knee: Secondary | ICD-10-CM | POA: Diagnosis not present

## 2018-01-15 DIAGNOSIS — M25562 Pain in left knee: Secondary | ICD-10-CM | POA: Diagnosis not present

## 2018-01-20 DIAGNOSIS — M25562 Pain in left knee: Secondary | ICD-10-CM | POA: Diagnosis not present

## 2018-01-22 DIAGNOSIS — M25562 Pain in left knee: Secondary | ICD-10-CM | POA: Diagnosis not present

## 2018-01-27 DIAGNOSIS — M25562 Pain in left knee: Secondary | ICD-10-CM | POA: Diagnosis not present

## 2018-01-29 DIAGNOSIS — M25562 Pain in left knee: Secondary | ICD-10-CM | POA: Diagnosis not present

## 2018-02-03 DIAGNOSIS — M25562 Pain in left knee: Secondary | ICD-10-CM | POA: Diagnosis not present

## 2018-02-04 DIAGNOSIS — Z96653 Presence of artificial knee joint, bilateral: Secondary | ICD-10-CM | POA: Diagnosis not present

## 2018-02-04 DIAGNOSIS — M25561 Pain in right knee: Secondary | ICD-10-CM | POA: Diagnosis not present

## 2018-02-04 DIAGNOSIS — M25462 Effusion, left knee: Secondary | ICD-10-CM | POA: Diagnosis not present

## 2018-02-06 DIAGNOSIS — M25562 Pain in left knee: Secondary | ICD-10-CM | POA: Diagnosis not present

## 2018-02-19 DIAGNOSIS — F3181 Bipolar II disorder: Secondary | ICD-10-CM | POA: Diagnosis not present

## 2018-03-02 DIAGNOSIS — F3181 Bipolar II disorder: Secondary | ICD-10-CM | POA: Diagnosis not present

## 2018-03-19 DIAGNOSIS — F3181 Bipolar II disorder: Secondary | ICD-10-CM | POA: Diagnosis not present

## 2018-04-01 DIAGNOSIS — F3181 Bipolar II disorder: Secondary | ICD-10-CM | POA: Diagnosis not present

## 2018-04-17 DIAGNOSIS — F3181 Bipolar II disorder: Secondary | ICD-10-CM | POA: Diagnosis not present

## 2018-05-01 DIAGNOSIS — F3181 Bipolar II disorder: Secondary | ICD-10-CM | POA: Diagnosis not present

## 2018-05-04 DIAGNOSIS — F3181 Bipolar II disorder: Secondary | ICD-10-CM | POA: Diagnosis not present

## 2018-05-11 DIAGNOSIS — I890 Lymphedema, not elsewhere classified: Secondary | ICD-10-CM | POA: Diagnosis not present

## 2018-05-19 DIAGNOSIS — F3181 Bipolar II disorder: Secondary | ICD-10-CM | POA: Diagnosis not present

## 2018-06-02 DIAGNOSIS — F3181 Bipolar II disorder: Secondary | ICD-10-CM | POA: Diagnosis not present

## 2018-06-10 DIAGNOSIS — H52203 Unspecified astigmatism, bilateral: Secondary | ICD-10-CM | POA: Diagnosis not present

## 2018-06-10 DIAGNOSIS — H524 Presbyopia: Secondary | ICD-10-CM | POA: Diagnosis not present

## 2018-06-10 DIAGNOSIS — E119 Type 2 diabetes mellitus without complications: Secondary | ICD-10-CM | POA: Diagnosis not present

## 2018-06-10 DIAGNOSIS — Z961 Presence of intraocular lens: Secondary | ICD-10-CM | POA: Diagnosis not present

## 2018-06-26 DIAGNOSIS — F3181 Bipolar II disorder: Secondary | ICD-10-CM | POA: Diagnosis not present

## 2018-06-30 DIAGNOSIS — F3181 Bipolar II disorder: Secondary | ICD-10-CM | POA: Diagnosis not present

## 2018-07-07 DIAGNOSIS — F3181 Bipolar II disorder: Secondary | ICD-10-CM | POA: Diagnosis not present

## 2018-07-15 DIAGNOSIS — N183 Chronic kidney disease, stage 3 (moderate): Secondary | ICD-10-CM | POA: Diagnosis not present

## 2018-07-15 DIAGNOSIS — Z79899 Other long term (current) drug therapy: Secondary | ICD-10-CM | POA: Diagnosis not present

## 2018-07-15 DIAGNOSIS — F319 Bipolar disorder, unspecified: Secondary | ICD-10-CM | POA: Diagnosis not present

## 2018-07-15 DIAGNOSIS — E113293 Type 2 diabetes mellitus with mild nonproliferative diabetic retinopathy without macular edema, bilateral: Secondary | ICD-10-CM | POA: Diagnosis not present

## 2018-07-15 DIAGNOSIS — E1140 Type 2 diabetes mellitus with diabetic neuropathy, unspecified: Secondary | ICD-10-CM | POA: Diagnosis not present

## 2018-07-15 DIAGNOSIS — I129 Hypertensive chronic kidney disease with stage 1 through stage 4 chronic kidney disease, or unspecified chronic kidney disease: Secondary | ICD-10-CM | POA: Diagnosis not present

## 2018-07-15 DIAGNOSIS — E1121 Type 2 diabetes mellitus with diabetic nephropathy: Secondary | ICD-10-CM | POA: Diagnosis not present

## 2018-07-15 DIAGNOSIS — B029 Zoster without complications: Secondary | ICD-10-CM | POA: Diagnosis not present

## 2018-07-15 DIAGNOSIS — Z7984 Long term (current) use of oral hypoglycemic drugs: Secondary | ICD-10-CM | POA: Diagnosis not present

## 2018-07-17 DIAGNOSIS — Z79899 Other long term (current) drug therapy: Secondary | ICD-10-CM | POA: Diagnosis not present

## 2018-08-07 DIAGNOSIS — F3181 Bipolar II disorder: Secondary | ICD-10-CM | POA: Diagnosis not present

## 2018-08-25 DIAGNOSIS — F3181 Bipolar II disorder: Secondary | ICD-10-CM | POA: Diagnosis not present

## 2018-09-04 DIAGNOSIS — F3181 Bipolar II disorder: Secondary | ICD-10-CM | POA: Diagnosis not present

## 2018-09-18 DIAGNOSIS — F3181 Bipolar II disorder: Secondary | ICD-10-CM | POA: Diagnosis not present

## 2018-10-02 DIAGNOSIS — F3181 Bipolar II disorder: Secondary | ICD-10-CM | POA: Diagnosis not present

## 2018-10-18 DIAGNOSIS — Z23 Encounter for immunization: Secondary | ICD-10-CM | POA: Diagnosis not present

## 2018-11-06 DIAGNOSIS — F3181 Bipolar II disorder: Secondary | ICD-10-CM | POA: Diagnosis not present

## 2018-11-16 DIAGNOSIS — G4733 Obstructive sleep apnea (adult) (pediatric): Secondary | ICD-10-CM | POA: Diagnosis not present

## 2018-12-09 DIAGNOSIS — M1712 Unilateral primary osteoarthritis, left knee: Secondary | ICD-10-CM | POA: Diagnosis not present

## 2018-12-09 DIAGNOSIS — Z471 Aftercare following joint replacement surgery: Secondary | ICD-10-CM | POA: Diagnosis not present

## 2018-12-09 DIAGNOSIS — Z96652 Presence of left artificial knee joint: Secondary | ICD-10-CM | POA: Diagnosis not present

## 2018-12-09 DIAGNOSIS — Z96651 Presence of right artificial knee joint: Secondary | ICD-10-CM | POA: Diagnosis not present

## 2018-12-10 DIAGNOSIS — F3181 Bipolar II disorder: Secondary | ICD-10-CM | POA: Diagnosis not present

## 2018-12-22 DIAGNOSIS — F3181 Bipolar II disorder: Secondary | ICD-10-CM | POA: Diagnosis not present

## 2019-01-01 DIAGNOSIS — F4312 Post-traumatic stress disorder, chronic: Secondary | ICD-10-CM | POA: Diagnosis not present

## 2019-01-01 DIAGNOSIS — F3132 Bipolar disorder, current episode depressed, moderate: Secondary | ICD-10-CM | POA: Diagnosis not present

## 2019-01-08 DIAGNOSIS — F3181 Bipolar II disorder: Secondary | ICD-10-CM | POA: Diagnosis not present

## 2019-01-27 DIAGNOSIS — J0100 Acute maxillary sinusitis, unspecified: Secondary | ICD-10-CM | POA: Diagnosis not present

## 2019-02-03 DIAGNOSIS — F3181 Bipolar II disorder: Secondary | ICD-10-CM | POA: Diagnosis not present

## 2019-02-12 DIAGNOSIS — F3181 Bipolar II disorder: Secondary | ICD-10-CM | POA: Diagnosis not present

## 2019-02-18 DIAGNOSIS — F3181 Bipolar II disorder: Secondary | ICD-10-CM | POA: Diagnosis not present

## 2019-02-23 DIAGNOSIS — F3181 Bipolar II disorder: Secondary | ICD-10-CM | POA: Diagnosis not present

## 2019-03-05 DIAGNOSIS — F3181 Bipolar II disorder: Secondary | ICD-10-CM | POA: Diagnosis not present

## 2019-03-09 DIAGNOSIS — F3181 Bipolar II disorder: Secondary | ICD-10-CM | POA: Diagnosis not present

## 2019-03-10 DIAGNOSIS — Z79899 Other long term (current) drug therapy: Secondary | ICD-10-CM | POA: Diagnosis not present

## 2019-03-11 DIAGNOSIS — I129 Hypertensive chronic kidney disease with stage 1 through stage 4 chronic kidney disease, or unspecified chronic kidney disease: Secondary | ICD-10-CM | POA: Diagnosis not present

## 2019-03-11 DIAGNOSIS — E1121 Type 2 diabetes mellitus with diabetic nephropathy: Secondary | ICD-10-CM | POA: Diagnosis not present

## 2019-03-11 DIAGNOSIS — I890 Lymphedema, not elsewhere classified: Secondary | ICD-10-CM | POA: Diagnosis not present

## 2019-03-11 DIAGNOSIS — G2401 Drug induced subacute dyskinesia: Secondary | ICD-10-CM | POA: Diagnosis not present

## 2019-03-11 DIAGNOSIS — F3132 Bipolar disorder, current episode depressed, moderate: Secondary | ICD-10-CM | POA: Diagnosis not present

## 2019-03-11 DIAGNOSIS — R0602 Shortness of breath: Secondary | ICD-10-CM | POA: Diagnosis not present

## 2019-03-11 DIAGNOSIS — E611 Iron deficiency: Secondary | ICD-10-CM | POA: Diagnosis not present

## 2019-03-11 DIAGNOSIS — E1140 Type 2 diabetes mellitus with diabetic neuropathy, unspecified: Secondary | ICD-10-CM | POA: Diagnosis not present

## 2019-03-11 DIAGNOSIS — E1142 Type 2 diabetes mellitus with diabetic polyneuropathy: Secondary | ICD-10-CM | POA: Diagnosis not present

## 2019-03-11 DIAGNOSIS — N183 Chronic kidney disease, stage 3 (moderate): Secondary | ICD-10-CM | POA: Diagnosis not present

## 2019-03-12 DIAGNOSIS — F4312 Post-traumatic stress disorder, chronic: Secondary | ICD-10-CM | POA: Diagnosis not present

## 2019-03-12 DIAGNOSIS — F332 Major depressive disorder, recurrent severe without psychotic features: Secondary | ICD-10-CM | POA: Diagnosis not present

## 2019-03-17 DIAGNOSIS — F3181 Bipolar II disorder: Secondary | ICD-10-CM | POA: Diagnosis not present

## 2019-03-18 DIAGNOSIS — G2570 Drug induced movement disorder, unspecified: Secondary | ICD-10-CM | POA: Diagnosis not present

## 2019-03-18 DIAGNOSIS — F332 Major depressive disorder, recurrent severe without psychotic features: Secondary | ICD-10-CM | POA: Diagnosis not present

## 2019-03-18 DIAGNOSIS — F4312 Post-traumatic stress disorder, chronic: Secondary | ICD-10-CM | POA: Diagnosis not present

## 2019-03-25 DIAGNOSIS — F3181 Bipolar II disorder: Secondary | ICD-10-CM | POA: Diagnosis not present

## 2019-04-06 DIAGNOSIS — F3181 Bipolar II disorder: Secondary | ICD-10-CM | POA: Diagnosis not present

## 2019-04-09 DIAGNOSIS — F4312 Post-traumatic stress disorder, chronic: Secondary | ICD-10-CM | POA: Diagnosis not present

## 2019-04-09 DIAGNOSIS — G2570 Drug induced movement disorder, unspecified: Secondary | ICD-10-CM | POA: Diagnosis not present

## 2019-04-09 DIAGNOSIS — F332 Major depressive disorder, recurrent severe without psychotic features: Secondary | ICD-10-CM | POA: Diagnosis not present

## 2019-04-23 DIAGNOSIS — F3181 Bipolar II disorder: Secondary | ICD-10-CM | POA: Diagnosis not present

## 2019-04-28 DIAGNOSIS — E1140 Type 2 diabetes mellitus with diabetic neuropathy, unspecified: Secondary | ICD-10-CM | POA: Diagnosis not present

## 2019-04-28 DIAGNOSIS — Z0000 Encounter for general adult medical examination without abnormal findings: Secondary | ICD-10-CM | POA: Diagnosis not present

## 2019-04-28 DIAGNOSIS — E1121 Type 2 diabetes mellitus with diabetic nephropathy: Secondary | ICD-10-CM | POA: Diagnosis not present

## 2019-04-28 DIAGNOSIS — Z7984 Long term (current) use of oral hypoglycemic drugs: Secondary | ICD-10-CM | POA: Diagnosis not present

## 2019-04-28 DIAGNOSIS — I129 Hypertensive chronic kidney disease with stage 1 through stage 4 chronic kidney disease, or unspecified chronic kidney disease: Secondary | ICD-10-CM | POA: Diagnosis not present

## 2019-04-28 DIAGNOSIS — F319 Bipolar disorder, unspecified: Secondary | ICD-10-CM | POA: Diagnosis not present

## 2019-05-06 DIAGNOSIS — F332 Major depressive disorder, recurrent severe without psychotic features: Secondary | ICD-10-CM | POA: Diagnosis not present

## 2019-05-06 DIAGNOSIS — F4312 Post-traumatic stress disorder, chronic: Secondary | ICD-10-CM | POA: Diagnosis not present

## 2019-05-06 DIAGNOSIS — G2570 Drug induced movement disorder, unspecified: Secondary | ICD-10-CM | POA: Diagnosis not present

## 2019-05-06 DIAGNOSIS — F5101 Primary insomnia: Secondary | ICD-10-CM | POA: Diagnosis not present

## 2019-05-26 DIAGNOSIS — F3181 Bipolar II disorder: Secondary | ICD-10-CM | POA: Diagnosis not present

## 2019-06-08 DIAGNOSIS — F3181 Bipolar II disorder: Secondary | ICD-10-CM | POA: Diagnosis not present

## 2019-06-16 DIAGNOSIS — F4312 Post-traumatic stress disorder, chronic: Secondary | ICD-10-CM | POA: Diagnosis not present

## 2019-06-16 DIAGNOSIS — F332 Major depressive disorder, recurrent severe without psychotic features: Secondary | ICD-10-CM | POA: Diagnosis not present

## 2019-06-16 DIAGNOSIS — G2570 Drug induced movement disorder, unspecified: Secondary | ICD-10-CM | POA: Diagnosis not present

## 2019-06-16 DIAGNOSIS — F5101 Primary insomnia: Secondary | ICD-10-CM | POA: Diagnosis not present

## 2019-06-29 DIAGNOSIS — F3181 Bipolar II disorder: Secondary | ICD-10-CM | POA: Diagnosis not present

## 2019-07-14 DIAGNOSIS — F3181 Bipolar II disorder: Secondary | ICD-10-CM | POA: Diagnosis not present

## 2019-07-19 DIAGNOSIS — F3132 Bipolar disorder, current episode depressed, moderate: Secondary | ICD-10-CM | POA: Diagnosis not present

## 2019-07-19 DIAGNOSIS — E7800 Pure hypercholesterolemia, unspecified: Secondary | ICD-10-CM | POA: Diagnosis not present

## 2019-07-19 DIAGNOSIS — N183 Chronic kidney disease, stage 3 (moderate): Secondary | ICD-10-CM | POA: Diagnosis not present

## 2019-07-19 DIAGNOSIS — E1121 Type 2 diabetes mellitus with diabetic nephropathy: Secondary | ICD-10-CM | POA: Diagnosis not present

## 2019-07-19 DIAGNOSIS — I129 Hypertensive chronic kidney disease with stage 1 through stage 4 chronic kidney disease, or unspecified chronic kidney disease: Secondary | ICD-10-CM | POA: Diagnosis not present

## 2019-07-19 DIAGNOSIS — E1142 Type 2 diabetes mellitus with diabetic polyneuropathy: Secondary | ICD-10-CM | POA: Diagnosis not present

## 2019-07-19 DIAGNOSIS — I10 Essential (primary) hypertension: Secondary | ICD-10-CM | POA: Diagnosis not present

## 2019-07-19 DIAGNOSIS — E1140 Type 2 diabetes mellitus with diabetic neuropathy, unspecified: Secondary | ICD-10-CM | POA: Diagnosis not present

## 2019-07-19 DIAGNOSIS — E119 Type 2 diabetes mellitus without complications: Secondary | ICD-10-CM | POA: Diagnosis not present

## 2019-07-27 DIAGNOSIS — F3181 Bipolar II disorder: Secondary | ICD-10-CM | POA: Diagnosis not present

## 2019-08-11 DIAGNOSIS — F3181 Bipolar II disorder: Secondary | ICD-10-CM | POA: Diagnosis not present

## 2019-08-19 DIAGNOSIS — F5101 Primary insomnia: Secondary | ICD-10-CM | POA: Diagnosis not present

## 2019-08-19 DIAGNOSIS — F332 Major depressive disorder, recurrent severe without psychotic features: Secondary | ICD-10-CM | POA: Diagnosis not present

## 2019-08-19 DIAGNOSIS — F4312 Post-traumatic stress disorder, chronic: Secondary | ICD-10-CM | POA: Diagnosis not present

## 2019-08-19 DIAGNOSIS — G2570 Drug induced movement disorder, unspecified: Secondary | ICD-10-CM | POA: Diagnosis not present

## 2019-08-24 DIAGNOSIS — F3181 Bipolar II disorder: Secondary | ICD-10-CM | POA: Diagnosis not present

## 2019-08-25 ENCOUNTER — Ambulatory Visit: Payer: Medicare Other | Admitting: Neurology

## 2019-08-26 ENCOUNTER — Other Ambulatory Visit: Payer: Self-pay

## 2019-08-26 ENCOUNTER — Ambulatory Visit (INDEPENDENT_AMBULATORY_CARE_PROVIDER_SITE_OTHER): Payer: Medicare Other | Admitting: Neurology

## 2019-08-26 ENCOUNTER — Encounter: Payer: Self-pay | Admitting: Neurology

## 2019-08-26 DIAGNOSIS — R269 Unspecified abnormalities of gait and mobility: Secondary | ICD-10-CM | POA: Diagnosis not present

## 2019-08-26 HISTORY — DX: Unspecified abnormalities of gait and mobility: R26.9

## 2019-08-26 NOTE — Progress Notes (Signed)
Reason for visit: Tremor  Referring physician: Dr. Melinda Crutch is a 73 y.o. female  History of present illness:  Ms. Costea is a 73 year old right-handed white female with a history of anxiety and depression.  The patient in the past was treated with Seroquel and Abilify, she began having symptoms of tardive dyskinesia on the Abilify and was taken off of this medication 6 or 7 months ago.  After coming off the Abilify, tardive dyskinesia movements worsen significantly, she had involvement of the lips and tongue, and had restless movements and jerking of the legs.  The patient developed some deterioration in her ability to walk, she has been using a walker since that time.  She was started on Ingrezza and the dose was increased to 80 mg daily with 80% improvement in her tardive dyskinesia movements.  The patient has had 1 fall within the last 2 months, she generally does not have difficulty with falling or gait instability when using the walker.  She does not operate a motor vehicle.  She does have a history of diabetes and she has a longstanding history of diabetic neuropathy with numbness up to the knees bilaterally.  She believes that her numbness in the legs has worsened recently.  She denies any low back pain or pain down the legs, she denies issues controlling the bowels or the bladder.  She occasionally have some left occipital discomfort.  She has developed recently some issues with resting tremor involving mainly the left upper extremity, she is referred to this office for evaluation of possible Parkinson's disease.  Past Medical History:  Diagnosis Date   Allergic rhinitis    Bipolar disorder ([place])    Cholelithiasis    seen on CAT 01/04/09   Chronic renal disease, stage III    [id number]   Complication of anesthesia    versed and fentanyl did not work for colonoscopy   DM (diabetes mellitus) ([place])    Edema    Gait abnormality 08/26/2019   HLD (hyperlipidemia)     HTN (hypertension)    Iron deficiency anemia    3/12    Lymphedema    tarda   Neuropathy    Obstructive sleep apnea on CPAP    Osteoarthritis    Renal cyst    2.7 cm seen on CAT 01/04/09   Tardive dyskinesia     Past Surgical History:  Procedure Laterality Date   ABDOMINAL HYSTERECTOMY     COLONOSCOPY  2009; 07/09/11   2009:normal (polyp was not adenoma); 2012:    DILATION AND CURETTAGE OF UTERUS     ESOPHAGOGASTRODUODENOSCOPY  07/09/11   Erosive gastritis   FRACTURE SURGERY     rt fx upper arm   GIVENS CAPSULE STUDY  07/25/2011   normal small bowel   REIMPLANT URETER IN BLADDER  1996   TRIGGER FINGER RELEASE Left 02/04/2013   Procedure: RELEASE TRIGGER FINGER/A-1 PULLEY LEFT RING FINGER;  Surgeon: Tennis Must, MD;  Location: [place];  Service: Orthopedics;  Laterality: Left;    Family History  Problem Relation Age of Onset   Diabetes Mother    Hyperlipidemia Mother    Heart disease Father    Heart attack Father    Colon cancer Neg Hx     Social history:  reports that she quit smoking about 28 years ago. Her smoking use included cigarettes. She has never used smokeless tobacco. She reports previous alcohol use. She reports that she does not  use drugs.  Medications:  Prior to Admission medications   Medication Sig Start Date End Date Taking? Authorizing Provider  ALPRAZolam Duanne Moron) 0.5 MG tablet Take 0.5 mg by mouth 3 (three) times daily as needed.      [provider]  ferrous sulfate 325 (65 FE) MG tablet Take 325 mg by mouth daily with breakfast.      [provider]  lamoTRIgine (LAMICTAL) 200 MG tablet Take 300 mg by mouth daily.     [provider]  losartan-hydrochlorothiazide (HYZAAR) 100-12.5 MG per tablet Take 1 tablet by mouth daily.      [provider]  metFORMIN (GLUCOPHAGE) 1000 MG tablet Take 1,000 mg by mouth 2 (two) times daily with a meal.     [provider]  mupirocin  ointment (BACTROBAN) 2 % Apply to wound twice a day. 07/06/15   Hyatt, Max T, DPM  neomycin-polymyxin-hydrocortisone (CORTISPORIN) otic solution Apply 1-2 drops after soaking to toe(s) daily 08/08/15   Hyatt, Max T, DPM  QUEtiapine (SEROQUEL) 100 MG tablet Take 100 mg by mouth at bedtime.      [provider]      Allergies  Allergen Reactions   Latuda [Lurasidone] Other (See Comments)    Increased suicidal thoughts     ROS:  Out of a complete 14 system review of symptoms, the patient complains only of the following symptoms, and all other reviewed systems are negative.  Tremor Walking difficulty Numbness  Blood pressure 132/86, pulse 76, temperature 99.2 F (37.3 C), temperature source Temporal, height 5\' 3"  (1.6 m), weight 227 lb (103 kg), SpO2 97 %.  Physical Exam  General: The patient is alert and cooperative at the time of the examination.  The patient is markedly obese.  Eyes: Pupils are equal, round, and reactive to light. Discs are flat bilaterally.  Neck: The neck is supple, no carotid bruits are noted.  Respiratory: The respiratory examination is clear.  Cardiovascular: The cardiovascular examination reveals a regular rate and rhythm, no obvious murmurs or rubs are noted.  Skin: Extremities are with 3+ edema below the knees bilaterally, worse on the left.  Neurologic Exam  Mental status: The patient is alert and oriented x 3 at the time of the examination. The patient has apparent normal recent and remote memory, with an apparently normal attention span and concentration ability.  Cranial nerves: Facial symmetry is present. There is good sensation of the face to pinprick and soft touch bilaterally. The strength of the facial muscles and the muscles to head turning and shoulder shrug are normal bilaterally. Speech is well enunciated, no aphasia or dysarthria is noted. Extraocular movements are full. Visual fields are full. The tongue is midline, and the  patient has symmetric elevation of the soft palate. No obvious hearing deficits are noted.  Motor: The motor testing reveals 5 over 5 strength of all 4 extremities. Good symmetric motor tone is noted throughout.  Sensory: Sensory testing is intact to pinprick, soft touch, vibration sensation, and position sense on the upper extremities.  With the lower extremities, pinprick sensation is symmetric, no definite stocking pattern pinprick sensory deficit was identified.  Vibration sensation is symmetric in the feet, but position sense is decreased in both feet.  No evidence of extinction is noted.  Coordination: Cerebellar testing reveals good finger-nose-finger and heel-to-shin bilaterally.  A mild resting tremors noted with the left upper extremity.  Gait and station: Gait is wide-based, slightly unsteady, the patient normally uses a walker for  ambulation.  Tandem gait was not tested. Romberg is negative. No drift is seen.  Reflexes: Deep tendon reflexes are symmetric, but are depressed bilaterally. Toes are downgoing bilaterally.   Assessment/Plan:  1.  Tardive dyskinesia  2.  Resting tremor, left upper extremity  3.  Gait disorder  4.  Diabetic peripheral neuropathy  The patient is on Ingrezza at 80 mg daily, this may cause parkinsonian features, and may be the source of the resting tremor on the left.  The patient does not have multiple symptoms suggestive of Parkinson's disease but she does have a gait disorder that likely is in part related to her diabetic neuropathy.  We will set her up for physical therapy for gait training.  We will watch her for any developing signs of Parkinson's disease.  In the future, a DaTscan may be of some benefit.  She will follow-up in 4 months.  Jill Alexanders MD 08/26/2019 11:09 AM  [facility] [address] [place], [place] [address]  Phone [phone] Fax [phone]

## 2019-08-29 DIAGNOSIS — F419 Anxiety disorder, unspecified: Secondary | ICD-10-CM | POA: Diagnosis not present

## 2019-08-29 DIAGNOSIS — G4733 Obstructive sleep apnea (adult) (pediatric): Secondary | ICD-10-CM | POA: Diagnosis not present

## 2019-08-29 DIAGNOSIS — I129 Hypertensive chronic kidney disease with stage 1 through stage 4 chronic kidney disease, or unspecified chronic kidney disease: Secondary | ICD-10-CM | POA: Diagnosis not present

## 2019-08-29 DIAGNOSIS — E1140 Type 2 diabetes mellitus with diabetic neuropathy, unspecified: Secondary | ICD-10-CM | POA: Diagnosis not present

## 2019-08-29 DIAGNOSIS — F319 Bipolar disorder, unspecified: Secondary | ICD-10-CM | POA: Diagnosis not present

## 2019-08-29 DIAGNOSIS — Z7984 Long term (current) use of oral hypoglycemic drugs: Secondary | ICD-10-CM | POA: Diagnosis not present

## 2019-08-29 DIAGNOSIS — G2401 Drug induced subacute dyskinesia: Secondary | ICD-10-CM | POA: Diagnosis not present

## 2019-08-29 DIAGNOSIS — N183 Chronic kidney disease, stage 3 unspecified: Secondary | ICD-10-CM | POA: Diagnosis not present

## 2019-08-29 DIAGNOSIS — E1122 Type 2 diabetes mellitus with diabetic chronic kidney disease: Secondary | ICD-10-CM | POA: Diagnosis not present

## 2019-09-01 DIAGNOSIS — F319 Bipolar disorder, unspecified: Secondary | ICD-10-CM | POA: Diagnosis not present

## 2019-09-01 DIAGNOSIS — G2401 Drug induced subacute dyskinesia: Secondary | ICD-10-CM | POA: Diagnosis not present

## 2019-09-01 DIAGNOSIS — E1122 Type 2 diabetes mellitus with diabetic chronic kidney disease: Secondary | ICD-10-CM | POA: Diagnosis not present

## 2019-09-01 DIAGNOSIS — E1140 Type 2 diabetes mellitus with diabetic neuropathy, unspecified: Secondary | ICD-10-CM | POA: Diagnosis not present

## 2019-09-01 DIAGNOSIS — F419 Anxiety disorder, unspecified: Secondary | ICD-10-CM | POA: Diagnosis not present

## 2019-09-01 DIAGNOSIS — I129 Hypertensive chronic kidney disease with stage 1 through stage 4 chronic kidney disease, or unspecified chronic kidney disease: Secondary | ICD-10-CM | POA: Diagnosis not present

## 2019-09-03 DIAGNOSIS — G2401 Drug induced subacute dyskinesia: Secondary | ICD-10-CM | POA: Diagnosis not present

## 2019-09-03 DIAGNOSIS — E1122 Type 2 diabetes mellitus with diabetic chronic kidney disease: Secondary | ICD-10-CM | POA: Diagnosis not present

## 2019-09-03 DIAGNOSIS — E1140 Type 2 diabetes mellitus with diabetic neuropathy, unspecified: Secondary | ICD-10-CM | POA: Diagnosis not present

## 2019-09-03 DIAGNOSIS — F419 Anxiety disorder, unspecified: Secondary | ICD-10-CM | POA: Diagnosis not present

## 2019-09-03 DIAGNOSIS — F319 Bipolar disorder, unspecified: Secondary | ICD-10-CM | POA: Diagnosis not present

## 2019-09-03 DIAGNOSIS — I129 Hypertensive chronic kidney disease with stage 1 through stage 4 chronic kidney disease, or unspecified chronic kidney disease: Secondary | ICD-10-CM | POA: Diagnosis not present

## 2019-09-06 DIAGNOSIS — E1140 Type 2 diabetes mellitus with diabetic neuropathy, unspecified: Secondary | ICD-10-CM | POA: Diagnosis not present

## 2019-09-06 DIAGNOSIS — F319 Bipolar disorder, unspecified: Secondary | ICD-10-CM | POA: Diagnosis not present

## 2019-09-06 DIAGNOSIS — F419 Anxiety disorder, unspecified: Secondary | ICD-10-CM | POA: Diagnosis not present

## 2019-09-06 DIAGNOSIS — G2401 Drug induced subacute dyskinesia: Secondary | ICD-10-CM | POA: Diagnosis not present

## 2019-09-06 DIAGNOSIS — E1122 Type 2 diabetes mellitus with diabetic chronic kidney disease: Secondary | ICD-10-CM | POA: Diagnosis not present

## 2019-09-06 DIAGNOSIS — I129 Hypertensive chronic kidney disease with stage 1 through stage 4 chronic kidney disease, or unspecified chronic kidney disease: Secondary | ICD-10-CM | POA: Diagnosis not present

## 2019-09-08 DIAGNOSIS — F3181 Bipolar II disorder: Secondary | ICD-10-CM | POA: Diagnosis not present

## 2019-09-08 DIAGNOSIS — G2401 Drug induced subacute dyskinesia: Secondary | ICD-10-CM | POA: Diagnosis not present

## 2019-09-08 DIAGNOSIS — F419 Anxiety disorder, unspecified: Secondary | ICD-10-CM | POA: Diagnosis not present

## 2019-09-08 DIAGNOSIS — E1140 Type 2 diabetes mellitus with diabetic neuropathy, unspecified: Secondary | ICD-10-CM | POA: Diagnosis not present

## 2019-09-08 DIAGNOSIS — E1122 Type 2 diabetes mellitus with diabetic chronic kidney disease: Secondary | ICD-10-CM | POA: Diagnosis not present

## 2019-09-08 DIAGNOSIS — F319 Bipolar disorder, unspecified: Secondary | ICD-10-CM | POA: Diagnosis not present

## 2019-09-08 DIAGNOSIS — I129 Hypertensive chronic kidney disease with stage 1 through stage 4 chronic kidney disease, or unspecified chronic kidney disease: Secondary | ICD-10-CM | POA: Diagnosis not present

## 2019-09-13 DIAGNOSIS — E1122 Type 2 diabetes mellitus with diabetic chronic kidney disease: Secondary | ICD-10-CM | POA: Diagnosis not present

## 2019-09-13 DIAGNOSIS — G2401 Drug induced subacute dyskinesia: Secondary | ICD-10-CM | POA: Diagnosis not present

## 2019-09-13 DIAGNOSIS — F319 Bipolar disorder, unspecified: Secondary | ICD-10-CM | POA: Diagnosis not present

## 2019-09-13 DIAGNOSIS — I129 Hypertensive chronic kidney disease with stage 1 through stage 4 chronic kidney disease, or unspecified chronic kidney disease: Secondary | ICD-10-CM | POA: Diagnosis not present

## 2019-09-13 DIAGNOSIS — E1140 Type 2 diabetes mellitus with diabetic neuropathy, unspecified: Secondary | ICD-10-CM | POA: Diagnosis not present

## 2019-09-13 DIAGNOSIS — F419 Anxiety disorder, unspecified: Secondary | ICD-10-CM | POA: Diagnosis not present

## 2019-09-16 DIAGNOSIS — F319 Bipolar disorder, unspecified: Secondary | ICD-10-CM | POA: Diagnosis not present

## 2019-09-16 DIAGNOSIS — E1122 Type 2 diabetes mellitus with diabetic chronic kidney disease: Secondary | ICD-10-CM | POA: Diagnosis not present

## 2019-09-16 DIAGNOSIS — F419 Anxiety disorder, unspecified: Secondary | ICD-10-CM | POA: Diagnosis not present

## 2019-09-16 DIAGNOSIS — I129 Hypertensive chronic kidney disease with stage 1 through stage 4 chronic kidney disease, or unspecified chronic kidney disease: Secondary | ICD-10-CM | POA: Diagnosis not present

## 2019-09-16 DIAGNOSIS — E1140 Type 2 diabetes mellitus with diabetic neuropathy, unspecified: Secondary | ICD-10-CM | POA: Diagnosis not present

## 2019-09-16 DIAGNOSIS — G2401 Drug induced subacute dyskinesia: Secondary | ICD-10-CM | POA: Diagnosis not present

## 2019-09-20 DIAGNOSIS — F319 Bipolar disorder, unspecified: Secondary | ICD-10-CM | POA: Diagnosis not present

## 2019-09-20 DIAGNOSIS — E1122 Type 2 diabetes mellitus with diabetic chronic kidney disease: Secondary | ICD-10-CM | POA: Diagnosis not present

## 2019-09-20 DIAGNOSIS — I129 Hypertensive chronic kidney disease with stage 1 through stage 4 chronic kidney disease, or unspecified chronic kidney disease: Secondary | ICD-10-CM | POA: Diagnosis not present

## 2019-09-20 DIAGNOSIS — F419 Anxiety disorder, unspecified: Secondary | ICD-10-CM | POA: Diagnosis not present

## 2019-09-20 DIAGNOSIS — E1140 Type 2 diabetes mellitus with diabetic neuropathy, unspecified: Secondary | ICD-10-CM | POA: Diagnosis not present

## 2019-09-20 DIAGNOSIS — G2401 Drug induced subacute dyskinesia: Secondary | ICD-10-CM | POA: Diagnosis not present

## 2019-09-22 ENCOUNTER — Telehealth: Payer: Self-pay | Admitting: Neurology

## 2019-09-22 DIAGNOSIS — G2401 Drug induced subacute dyskinesia: Secondary | ICD-10-CM | POA: Diagnosis not present

## 2019-09-22 DIAGNOSIS — E1140 Type 2 diabetes mellitus with diabetic neuropathy, unspecified: Secondary | ICD-10-CM | POA: Diagnosis not present

## 2019-09-22 DIAGNOSIS — F319 Bipolar disorder, unspecified: Secondary | ICD-10-CM | POA: Diagnosis not present

## 2019-09-22 DIAGNOSIS — I129 Hypertensive chronic kidney disease with stage 1 through stage 4 chronic kidney disease, or unspecified chronic kidney disease: Secondary | ICD-10-CM | POA: Diagnosis not present

## 2019-09-22 DIAGNOSIS — F419 Anxiety disorder, unspecified: Secondary | ICD-10-CM | POA: Diagnosis not present

## 2019-09-22 DIAGNOSIS — E1122 Type 2 diabetes mellitus with diabetic chronic kidney disease: Secondary | ICD-10-CM | POA: Diagnosis not present

## 2019-09-22 NOTE — Telephone Encounter (Signed)
Charis from [facility] called wanting VO on pt's PT 1 week 5 times Please advise.

## 2019-09-22 NOTE — Telephone Encounter (Signed)
I reached out to [facility] and provided verbal order for physical therapy. She verbalized understanding.

## 2019-09-28 DIAGNOSIS — F319 Bipolar disorder, unspecified: Secondary | ICD-10-CM | POA: Diagnosis not present

## 2019-09-28 DIAGNOSIS — G4733 Obstructive sleep apnea (adult) (pediatric): Secondary | ICD-10-CM | POA: Diagnosis not present

## 2019-09-28 DIAGNOSIS — E1122 Type 2 diabetes mellitus with diabetic chronic kidney disease: Secondary | ICD-10-CM | POA: Diagnosis not present

## 2019-09-28 DIAGNOSIS — Z7984 Long term (current) use of oral hypoglycemic drugs: Secondary | ICD-10-CM | POA: Diagnosis not present

## 2019-09-28 DIAGNOSIS — F419 Anxiety disorder, unspecified: Secondary | ICD-10-CM | POA: Diagnosis not present

## 2019-09-28 DIAGNOSIS — G2401 Drug induced subacute dyskinesia: Secondary | ICD-10-CM | POA: Diagnosis not present

## 2019-09-28 DIAGNOSIS — N183 Chronic kidney disease, stage 3 unspecified: Secondary | ICD-10-CM | POA: Diagnosis not present

## 2019-09-28 DIAGNOSIS — I129 Hypertensive chronic kidney disease with stage 1 through stage 4 chronic kidney disease, or unspecified chronic kidney disease: Secondary | ICD-10-CM | POA: Diagnosis not present

## 2019-09-28 DIAGNOSIS — E1140 Type 2 diabetes mellitus with diabetic neuropathy, unspecified: Secondary | ICD-10-CM | POA: Diagnosis not present

## 2019-09-29 DIAGNOSIS — F4312 Post-traumatic stress disorder, chronic: Secondary | ICD-10-CM | POA: Diagnosis not present

## 2019-09-29 DIAGNOSIS — F332 Major depressive disorder, recurrent severe without psychotic features: Secondary | ICD-10-CM | POA: Diagnosis not present

## 2019-09-29 DIAGNOSIS — F5101 Primary insomnia: Secondary | ICD-10-CM | POA: Diagnosis not present

## 2019-09-29 DIAGNOSIS — G2570 Drug induced movement disorder, unspecified: Secondary | ICD-10-CM | POA: Diagnosis not present

## 2019-10-05 DIAGNOSIS — F3181 Bipolar II disorder: Secondary | ICD-10-CM | POA: Diagnosis not present

## 2019-10-06 DIAGNOSIS — E1122 Type 2 diabetes mellitus with diabetic chronic kidney disease: Secondary | ICD-10-CM | POA: Diagnosis not present

## 2019-10-06 DIAGNOSIS — E119 Type 2 diabetes mellitus without complications: Secondary | ICD-10-CM | POA: Diagnosis not present

## 2019-10-06 DIAGNOSIS — F3132 Bipolar disorder, current episode depressed, moderate: Secondary | ICD-10-CM | POA: Diagnosis not present

## 2019-10-06 DIAGNOSIS — F319 Bipolar disorder, unspecified: Secondary | ICD-10-CM | POA: Diagnosis not present

## 2019-10-06 DIAGNOSIS — E7800 Pure hypercholesterolemia, unspecified: Secondary | ICD-10-CM | POA: Diagnosis not present

## 2019-10-06 DIAGNOSIS — G2401 Drug induced subacute dyskinesia: Secondary | ICD-10-CM | POA: Diagnosis not present

## 2019-10-06 DIAGNOSIS — E1140 Type 2 diabetes mellitus with diabetic neuropathy, unspecified: Secondary | ICD-10-CM | POA: Diagnosis not present

## 2019-10-06 DIAGNOSIS — E1121 Type 2 diabetes mellitus with diabetic nephropathy: Secondary | ICD-10-CM | POA: Diagnosis not present

## 2019-10-06 DIAGNOSIS — I10 Essential (primary) hypertension: Secondary | ICD-10-CM | POA: Diagnosis not present

## 2019-10-06 DIAGNOSIS — E1142 Type 2 diabetes mellitus with diabetic polyneuropathy: Secondary | ICD-10-CM | POA: Diagnosis not present

## 2019-10-06 DIAGNOSIS — F419 Anxiety disorder, unspecified: Secondary | ICD-10-CM | POA: Diagnosis not present

## 2019-10-06 DIAGNOSIS — I129 Hypertensive chronic kidney disease with stage 1 through stage 4 chronic kidney disease, or unspecified chronic kidney disease: Secondary | ICD-10-CM | POA: Diagnosis not present

## 2019-10-13 DIAGNOSIS — K5901 Slow transit constipation: Secondary | ICD-10-CM | POA: Diagnosis not present

## 2019-10-13 DIAGNOSIS — K921 Melena: Secondary | ICD-10-CM | POA: Diagnosis not present

## 2019-10-13 DIAGNOSIS — I10 Essential (primary) hypertension: Secondary | ICD-10-CM | POA: Diagnosis not present

## 2019-10-19 DIAGNOSIS — F3181 Bipolar II disorder: Secondary | ICD-10-CM | POA: Diagnosis not present

## 2019-10-21 ENCOUNTER — Telehealth: Payer: Self-pay | Admitting: Neurology

## 2019-10-21 NOTE — Telephone Encounter (Signed)
FYI-Sharice, Physical Therapist from [facility] called to inform provider that the pt had a fall last week and the EMS was called out but stated pt had not Injuries. P.T. Sharice states she saw pt yesterday and pt had no complaints, difficulties or pain.

## 2019-10-21 NOTE — Telephone Encounter (Signed)
Noted  

## 2019-10-26 ENCOUNTER — Telehealth: Payer: Self-pay | Admitting: Neurology

## 2019-10-26 NOTE — Telephone Encounter (Signed)
I reached back out to [facility] and provided verbal order needed for physical therapy. Charis was provided with CB # to further discuss if need be.

## 2019-10-26 NOTE — Telephone Encounter (Signed)
charise with [facility] called for verbal orders for 1 time a week for 6 weeks in order to continue working with the patient- balance.    Please follow up.

## 2019-10-28 DIAGNOSIS — F319 Bipolar disorder, unspecified: Secondary | ICD-10-CM | POA: Diagnosis not present

## 2019-10-28 DIAGNOSIS — Z7984 Long term (current) use of oral hypoglycemic drugs: Secondary | ICD-10-CM | POA: Diagnosis not present

## 2019-10-28 DIAGNOSIS — G4733 Obstructive sleep apnea (adult) (pediatric): Secondary | ICD-10-CM | POA: Diagnosis not present

## 2019-10-28 DIAGNOSIS — I129 Hypertensive chronic kidney disease with stage 1 through stage 4 chronic kidney disease, or unspecified chronic kidney disease: Secondary | ICD-10-CM | POA: Diagnosis not present

## 2019-10-28 DIAGNOSIS — E1122 Type 2 diabetes mellitus with diabetic chronic kidney disease: Secondary | ICD-10-CM | POA: Diagnosis not present

## 2019-10-28 DIAGNOSIS — N183 Chronic kidney disease, stage 3 unspecified: Secondary | ICD-10-CM | POA: Diagnosis not present

## 2019-10-28 DIAGNOSIS — E1140 Type 2 diabetes mellitus with diabetic neuropathy, unspecified: Secondary | ICD-10-CM | POA: Diagnosis not present

## 2019-10-28 DIAGNOSIS — F419 Anxiety disorder, unspecified: Secondary | ICD-10-CM | POA: Diagnosis not present

## 2019-10-28 DIAGNOSIS — G2401 Drug induced subacute dyskinesia: Secondary | ICD-10-CM | POA: Diagnosis not present

## 2019-11-04 DIAGNOSIS — F419 Anxiety disorder, unspecified: Secondary | ICD-10-CM | POA: Diagnosis not present

## 2019-11-04 DIAGNOSIS — F319 Bipolar disorder, unspecified: Secondary | ICD-10-CM | POA: Diagnosis not present

## 2019-11-04 DIAGNOSIS — E1140 Type 2 diabetes mellitus with diabetic neuropathy, unspecified: Secondary | ICD-10-CM | POA: Diagnosis not present

## 2019-11-04 DIAGNOSIS — E1122 Type 2 diabetes mellitus with diabetic chronic kidney disease: Secondary | ICD-10-CM | POA: Diagnosis not present

## 2019-11-04 DIAGNOSIS — G2401 Drug induced subacute dyskinesia: Secondary | ICD-10-CM | POA: Diagnosis not present

## 2019-11-04 DIAGNOSIS — I129 Hypertensive chronic kidney disease with stage 1 through stage 4 chronic kidney disease, or unspecified chronic kidney disease: Secondary | ICD-10-CM | POA: Diagnosis not present

## 2019-11-10 DIAGNOSIS — G2401 Drug induced subacute dyskinesia: Secondary | ICD-10-CM | POA: Diagnosis not present

## 2019-11-10 DIAGNOSIS — F319 Bipolar disorder, unspecified: Secondary | ICD-10-CM | POA: Diagnosis not present

## 2019-11-10 DIAGNOSIS — E1122 Type 2 diabetes mellitus with diabetic chronic kidney disease: Secondary | ICD-10-CM | POA: Diagnosis not present

## 2019-11-10 DIAGNOSIS — I129 Hypertensive chronic kidney disease with stage 1 through stage 4 chronic kidney disease, or unspecified chronic kidney disease: Secondary | ICD-10-CM | POA: Diagnosis not present

## 2019-11-10 DIAGNOSIS — E1140 Type 2 diabetes mellitus with diabetic neuropathy, unspecified: Secondary | ICD-10-CM | POA: Diagnosis not present

## 2019-11-10 DIAGNOSIS — F419 Anxiety disorder, unspecified: Secondary | ICD-10-CM | POA: Diagnosis not present

## 2019-11-16 DIAGNOSIS — E1122 Type 2 diabetes mellitus with diabetic chronic kidney disease: Secondary | ICD-10-CM | POA: Diagnosis not present

## 2019-11-16 DIAGNOSIS — G2401 Drug induced subacute dyskinesia: Secondary | ICD-10-CM | POA: Diagnosis not present

## 2019-11-16 DIAGNOSIS — F419 Anxiety disorder, unspecified: Secondary | ICD-10-CM | POA: Diagnosis not present

## 2019-11-16 DIAGNOSIS — E1140 Type 2 diabetes mellitus with diabetic neuropathy, unspecified: Secondary | ICD-10-CM | POA: Diagnosis not present

## 2019-11-16 DIAGNOSIS — I129 Hypertensive chronic kidney disease with stage 1 through stage 4 chronic kidney disease, or unspecified chronic kidney disease: Secondary | ICD-10-CM | POA: Diagnosis not present

## 2019-11-16 DIAGNOSIS — F319 Bipolar disorder, unspecified: Secondary | ICD-10-CM | POA: Diagnosis not present

## 2019-11-22 DIAGNOSIS — F319 Bipolar disorder, unspecified: Secondary | ICD-10-CM | POA: Diagnosis not present

## 2019-11-22 DIAGNOSIS — E1122 Type 2 diabetes mellitus with diabetic chronic kidney disease: Secondary | ICD-10-CM | POA: Diagnosis not present

## 2019-11-22 DIAGNOSIS — E1140 Type 2 diabetes mellitus with diabetic neuropathy, unspecified: Secondary | ICD-10-CM | POA: Diagnosis not present

## 2019-11-22 DIAGNOSIS — F419 Anxiety disorder, unspecified: Secondary | ICD-10-CM | POA: Diagnosis not present

## 2019-11-22 DIAGNOSIS — I129 Hypertensive chronic kidney disease with stage 1 through stage 4 chronic kidney disease, or unspecified chronic kidney disease: Secondary | ICD-10-CM | POA: Diagnosis not present

## 2019-11-22 DIAGNOSIS — G2401 Drug induced subacute dyskinesia: Secondary | ICD-10-CM | POA: Diagnosis not present

## 2019-11-27 DIAGNOSIS — E1140 Type 2 diabetes mellitus with diabetic neuropathy, unspecified: Secondary | ICD-10-CM | POA: Diagnosis not present

## 2019-11-27 DIAGNOSIS — G4733 Obstructive sleep apnea (adult) (pediatric): Secondary | ICD-10-CM | POA: Diagnosis not present

## 2019-11-27 DIAGNOSIS — I129 Hypertensive chronic kidney disease with stage 1 through stage 4 chronic kidney disease, or unspecified chronic kidney disease: Secondary | ICD-10-CM | POA: Diagnosis not present

## 2019-11-27 DIAGNOSIS — Z7984 Long term (current) use of oral hypoglycemic drugs: Secondary | ICD-10-CM | POA: Diagnosis not present

## 2019-11-27 DIAGNOSIS — N183 Chronic kidney disease, stage 3 unspecified: Secondary | ICD-10-CM | POA: Diagnosis not present

## 2019-11-27 DIAGNOSIS — E1122 Type 2 diabetes mellitus with diabetic chronic kidney disease: Secondary | ICD-10-CM | POA: Diagnosis not present

## 2019-11-27 DIAGNOSIS — F319 Bipolar disorder, unspecified: Secondary | ICD-10-CM | POA: Diagnosis not present

## 2019-11-27 DIAGNOSIS — F419 Anxiety disorder, unspecified: Secondary | ICD-10-CM | POA: Diagnosis not present

## 2019-11-27 DIAGNOSIS — G2401 Drug induced subacute dyskinesia: Secondary | ICD-10-CM | POA: Diagnosis not present

## 2019-11-30 DIAGNOSIS — E1140 Type 2 diabetes mellitus with diabetic neuropathy, unspecified: Secondary | ICD-10-CM | POA: Diagnosis not present

## 2019-11-30 DIAGNOSIS — I129 Hypertensive chronic kidney disease with stage 1 through stage 4 chronic kidney disease, or unspecified chronic kidney disease: Secondary | ICD-10-CM | POA: Diagnosis not present

## 2019-11-30 DIAGNOSIS — E1122 Type 2 diabetes mellitus with diabetic chronic kidney disease: Secondary | ICD-10-CM | POA: Diagnosis not present

## 2019-11-30 DIAGNOSIS — F319 Bipolar disorder, unspecified: Secondary | ICD-10-CM | POA: Diagnosis not present

## 2019-11-30 DIAGNOSIS — G2401 Drug induced subacute dyskinesia: Secondary | ICD-10-CM | POA: Diagnosis not present

## 2019-11-30 DIAGNOSIS — F419 Anxiety disorder, unspecified: Secondary | ICD-10-CM | POA: Diagnosis not present

## 2019-12-06 ENCOUNTER — Other Ambulatory Visit: Payer: Self-pay

## 2019-12-06 ENCOUNTER — Emergency Department (HOSPITAL_COMMUNITY)
Admission: EM | Admit: 2019-12-06 | Discharge: 2019-12-07 | Disposition: A | Discharge status: Home / Self Care | Payer: Medicare Other | Attending: Emergency Medicine | Admitting: Emergency Medicine

## 2019-12-06 ENCOUNTER — Encounter (HOSPITAL_COMMUNITY): Payer: Self-pay | Admitting: Emergency Medicine

## 2019-12-06 ENCOUNTER — Encounter (HOSPITAL_COMMUNITY): Payer: Self-pay

## 2019-12-06 ENCOUNTER — Emergency Department (HOSPITAL_COMMUNITY): Payer: Medicare Other

## 2019-12-06 ENCOUNTER — Ambulatory Visit (HOSPITAL_COMMUNITY)
Admission: EM | Admit: 2019-12-06 | Discharge: 2019-12-06 | Disposition: A | Discharge status: Home / Self Care | Payer: Medicare Other | Source: Home / Self Care

## 2019-12-06 DIAGNOSIS — Y92009 Unspecified place in unspecified non-institutional (private) residence as the place of occurrence of the external cause: Secondary | ICD-10-CM | POA: Diagnosis not present

## 2019-12-06 DIAGNOSIS — R55 Syncope and collapse: Secondary | ICD-10-CM | POA: Diagnosis not present

## 2019-12-06 DIAGNOSIS — Y999 Unspecified external cause status: Secondary | ICD-10-CM | POA: Diagnosis not present

## 2019-12-06 DIAGNOSIS — Y939 Activity, unspecified: Secondary | ICD-10-CM | POA: Insufficient documentation

## 2019-12-06 DIAGNOSIS — S0990XA Unspecified injury of head, initial encounter: Secondary | ICD-10-CM | POA: Diagnosis not present

## 2019-12-06 DIAGNOSIS — N183 Chronic kidney disease, stage 3 unspecified: Secondary | ICD-10-CM | POA: Insufficient documentation

## 2019-12-06 DIAGNOSIS — R42 Dizziness and giddiness: Secondary | ICD-10-CM | POA: Diagnosis not present

## 2019-12-06 DIAGNOSIS — M533 Sacrococcygeal disorders, not elsewhere classified: Secondary | ICD-10-CM | POA: Diagnosis not present

## 2019-12-06 DIAGNOSIS — W010XXA Fall on same level from slipping, tripping and stumbling without subsequent striking against object, initial encounter: Secondary | ICD-10-CM | POA: Diagnosis not present

## 2019-12-06 DIAGNOSIS — Z87891 Personal history of nicotine dependence: Secondary | ICD-10-CM | POA: Diagnosis not present

## 2019-12-06 DIAGNOSIS — E1122 Type 2 diabetes mellitus with diabetic chronic kidney disease: Secondary | ICD-10-CM | POA: Insufficient documentation

## 2019-12-06 DIAGNOSIS — Z7984 Long term (current) use of oral hypoglycemic drugs: Secondary | ICD-10-CM | POA: Diagnosis not present

## 2019-12-06 DIAGNOSIS — W19XXXA Unspecified fall, initial encounter: Secondary | ICD-10-CM

## 2019-12-06 DIAGNOSIS — S300XXA Contusion of lower back and pelvis, initial encounter: Secondary | ICD-10-CM | POA: Insufficient documentation

## 2019-12-06 DIAGNOSIS — I129 Hypertensive chronic kidney disease with stage 1 through stage 4 chronic kidney disease, or unspecified chronic kidney disease: Secondary | ICD-10-CM | POA: Insufficient documentation

## 2019-12-06 DIAGNOSIS — Z79899 Other long term (current) drug therapy: Secondary | ICD-10-CM | POA: Insufficient documentation

## 2019-12-06 LAB — CBC WITH DIFFERENTIAL/PLATELET
Abs Immature Granulocytes: 0.04 10*3/uL (ref 0.00–0.07)
Basophils Absolute: 0 10*3/uL (ref 0.0–0.1)
Basophils Relative: 0 %
Eosinophils Absolute: 0.1 10*3/uL (ref 0.0–0.5)
Eosinophils Relative: 2 %
HCT: 44.9 % (ref 36.0–46.0)
Hemoglobin: 14.7 g/dL (ref 12.0–15.0)
Immature Granulocytes: 1 %
Lymphocytes Relative: 16 %
Lymphs Abs: 1.1 10*3/uL (ref 0.7–4.0)
MCH: 32.5 pg (ref 26.0–34.0)
MCHC: 32.7 g/dL (ref 30.0–36.0)
MCV: 99.3 fL (ref 80.0–100.0)
Monocytes Absolute: 0.6 10*3/uL (ref 0.1–1.0)
Monocytes Relative: 8 %
Neutro Abs: 5.3 10*3/uL (ref 1.7–7.7)
Neutrophils Relative %: 73 %
Platelets: 170 10*3/uL (ref 150–400)
RBC: 4.52 MIL/uL (ref 3.87–5.11)
RDW: 13 % (ref 11.5–15.5)
WBC: 7.2 10*3/uL (ref 4.0–10.5)
nRBC: 0 % (ref 0.0–0.2)

## 2019-12-06 LAB — BASIC METABOLIC PANEL
Anion gap: 13 (ref 5–15)
BUN: 21 mg/dL (ref 8–23)
CO2: 23 mmol/L (ref 22–32)
Calcium: 9.8 mg/dL (ref 8.9–10.3)
Chloride: 104 mmol/L (ref 98–111)
Creatinine, Ser: 1.31 mg/dL — ABNORMAL HIGH (ref 0.44–1.00)
GFR calc Af Amer: 47 mL/min — ABNORMAL LOW (ref >60)
GFR calc non Af Amer: 40 mL/min — ABNORMAL LOW (ref >60)
Glucose, Bld: 85 mg/dL (ref 70–99)
Potassium: 4.6 mmol/L (ref 3.5–5.1)
Sodium: 140 mmol/L (ref 135–145)

## 2019-12-06 LAB — CBG MONITORING, ED
Glucose-Capillary: 67 mg/dL — ABNORMAL LOW (ref 70–99)
Glucose-Capillary: 73 mg/dL (ref 70–99)

## 2019-12-06 IMAGING — CT CT HEAD W/O CM
3 series · 14 of 47 positions shown, 16 images · non-contrast
Comparison: None.

CLINICAL DATA: Status post fall.

EXAM:
CT HEAD WITHOUT CONTRAST
TECHNIQUE: Contiguous axial images were obtained from the base of the skull
through the vertex without intravenous contrast.

[Series 3: head 5.0 h30s · axial · 0.49mm/px · z∈[+1250,+1385]mm · 8 of 33 slices shown, 10 images]
[im 3/33  brain]
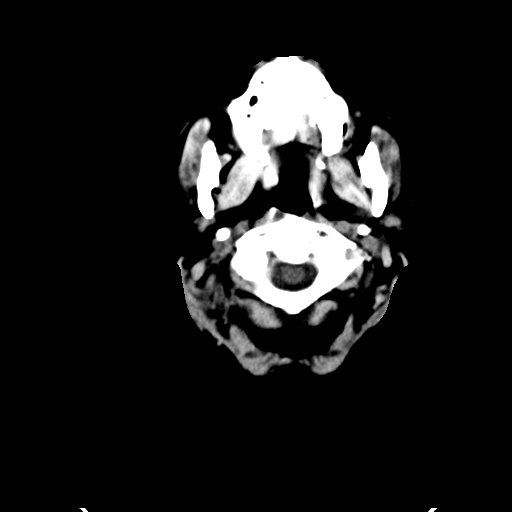
[im 3/33  bone]
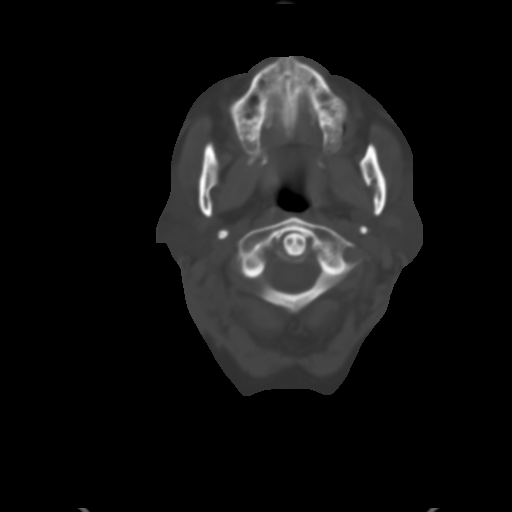
[im 7/33  brain]
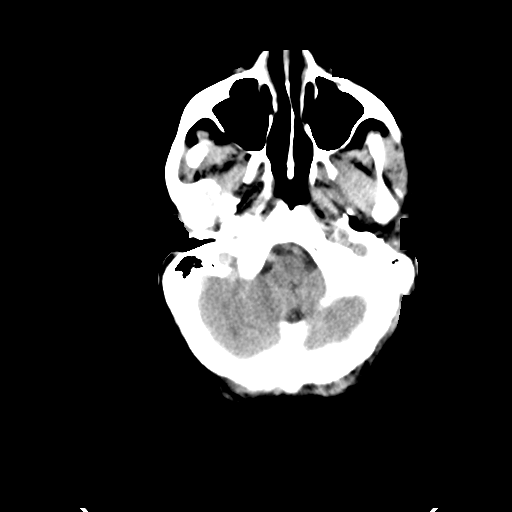
[im 10/33  brain]
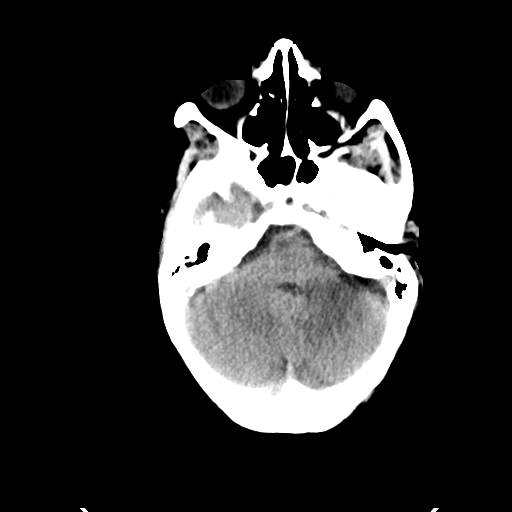
[im 15/33  brain]
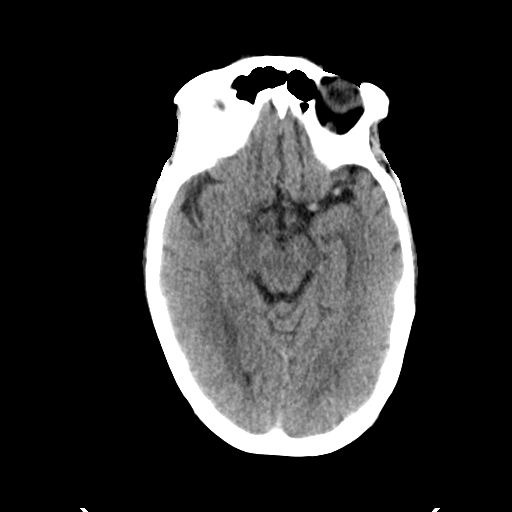
[im 18/33  brain]
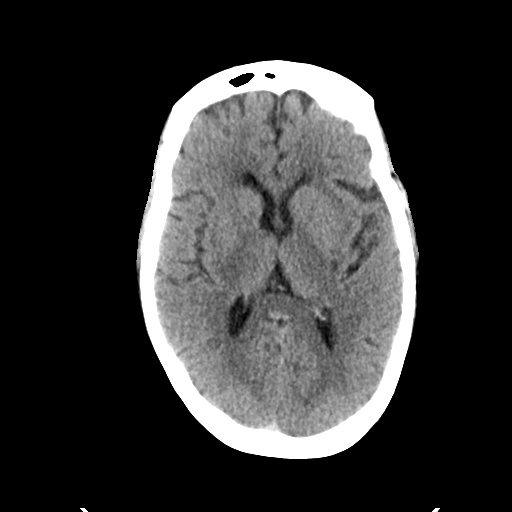
[im 18/33  bone]
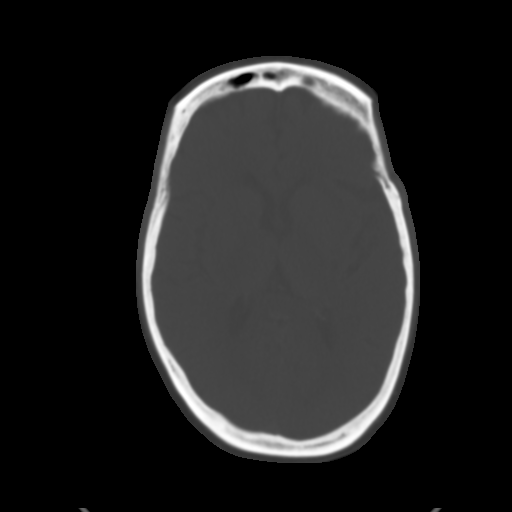
[im 23/33  brain]
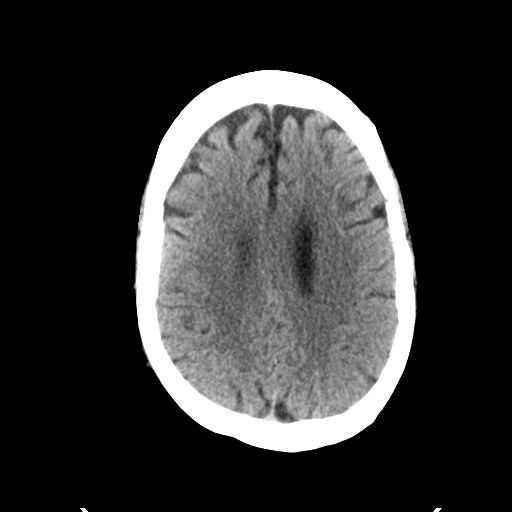
[im 26/33  brain]
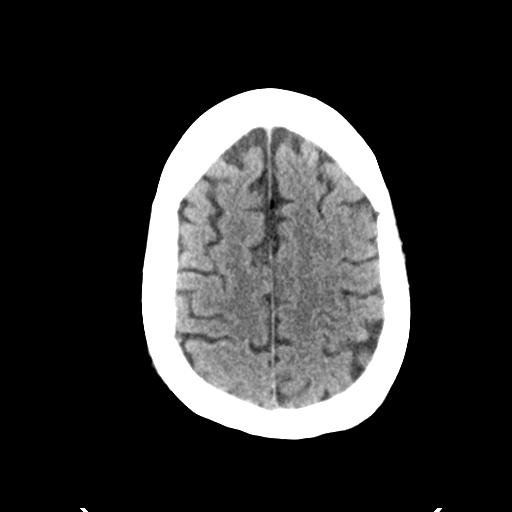
[im 30/33  brain]
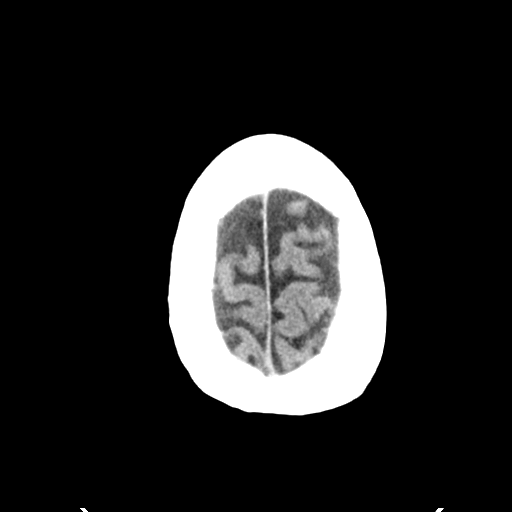

[Series 5: head 3.0 mpr cor · coronal · 0.37mm/px · 3 of 75 slices shown]
[im 25/75  brain]
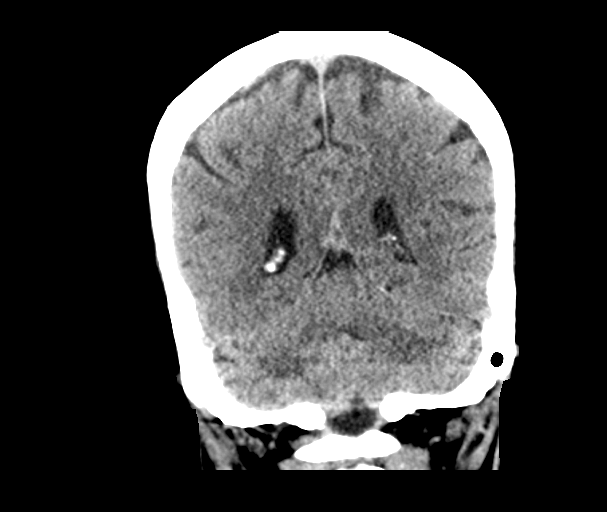
[im 33/75  brain]
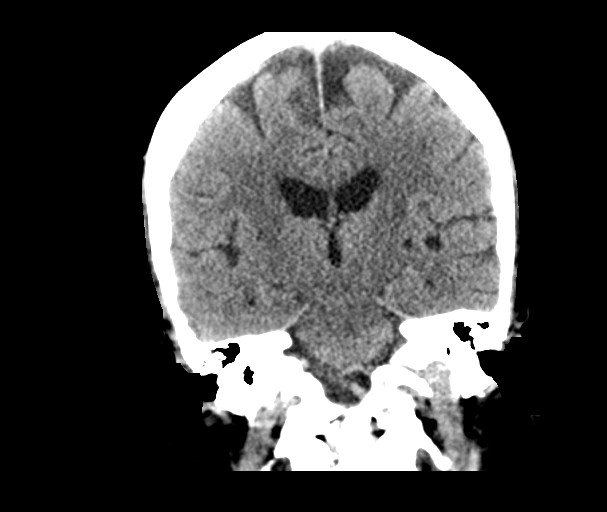
[im 42/75  brain]
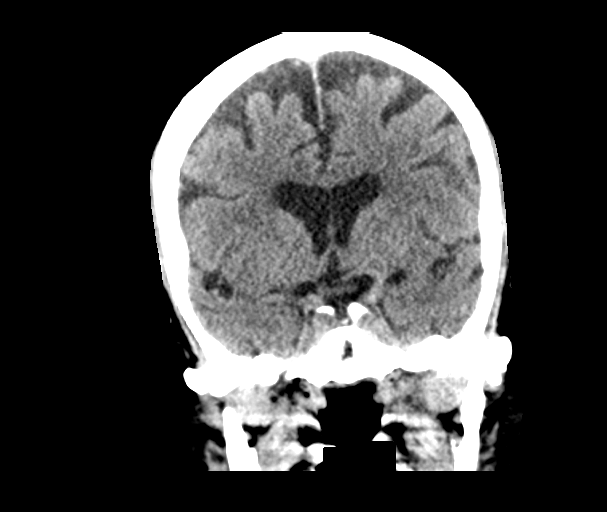

[Series 6: head 3.0 mpr sag · sagittal · 0.32mm/px · 3 of 59 slices shown]
[im 20/59  brain]
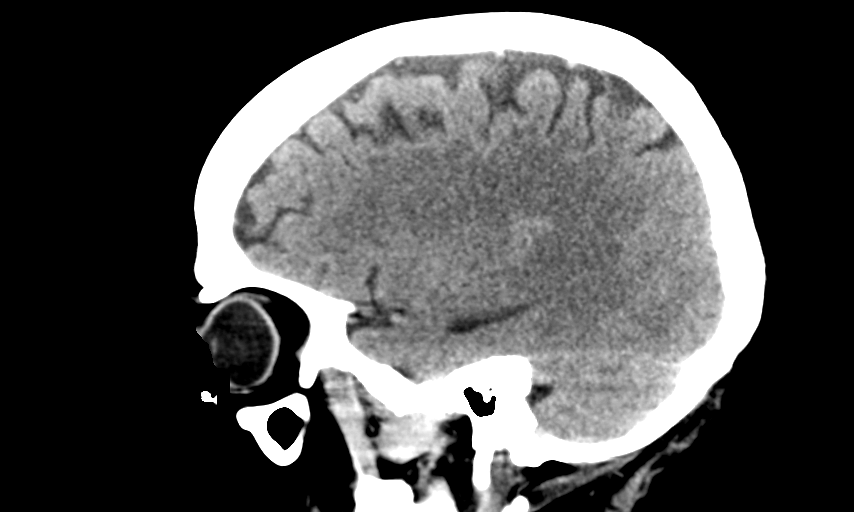
[im 30/59  brain]
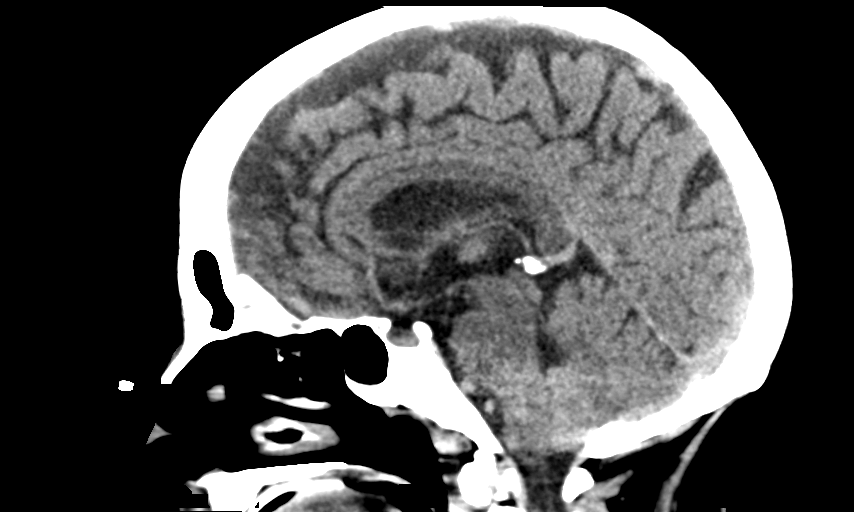
[im 39/59  brain]
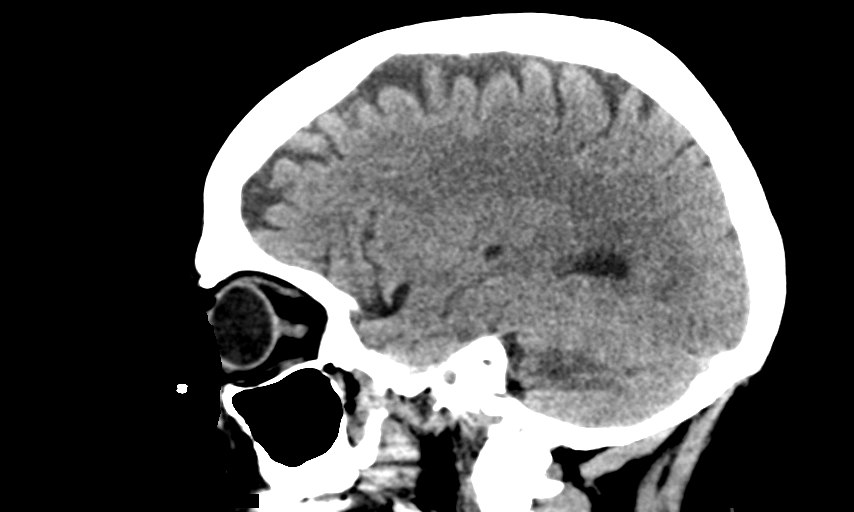

[14 of 47 positions shown; findings below may reference images not displayed]

FINDINGS: Brain: There is mild cerebral atrophy with widening of the
extra-axial spaces and ventricular dilatation.
There are areas of decreased attenuation within the white matter
tracts of the supratentorial brain, consistent with microvascular
disease changes.

Vascular: No hyperdense vessel or unexpected calcification.

Skull: Normal. Negative for fracture or focal lesion.

Sinuses/Orbits: No acute finding.

Other: None.
IMPRESSION: No acute intracranial pathology.

## 2019-12-06 IMAGING — CR DG SACRUM/COCCYX 2+V
3 series · 3 of 3 positions shown · non-contrast
Comparison: None.

CLINICAL DATA: Tailbone.

EXAM:
SACRUM AND COCCYX - 2+ VIEW

[coccyx ap]
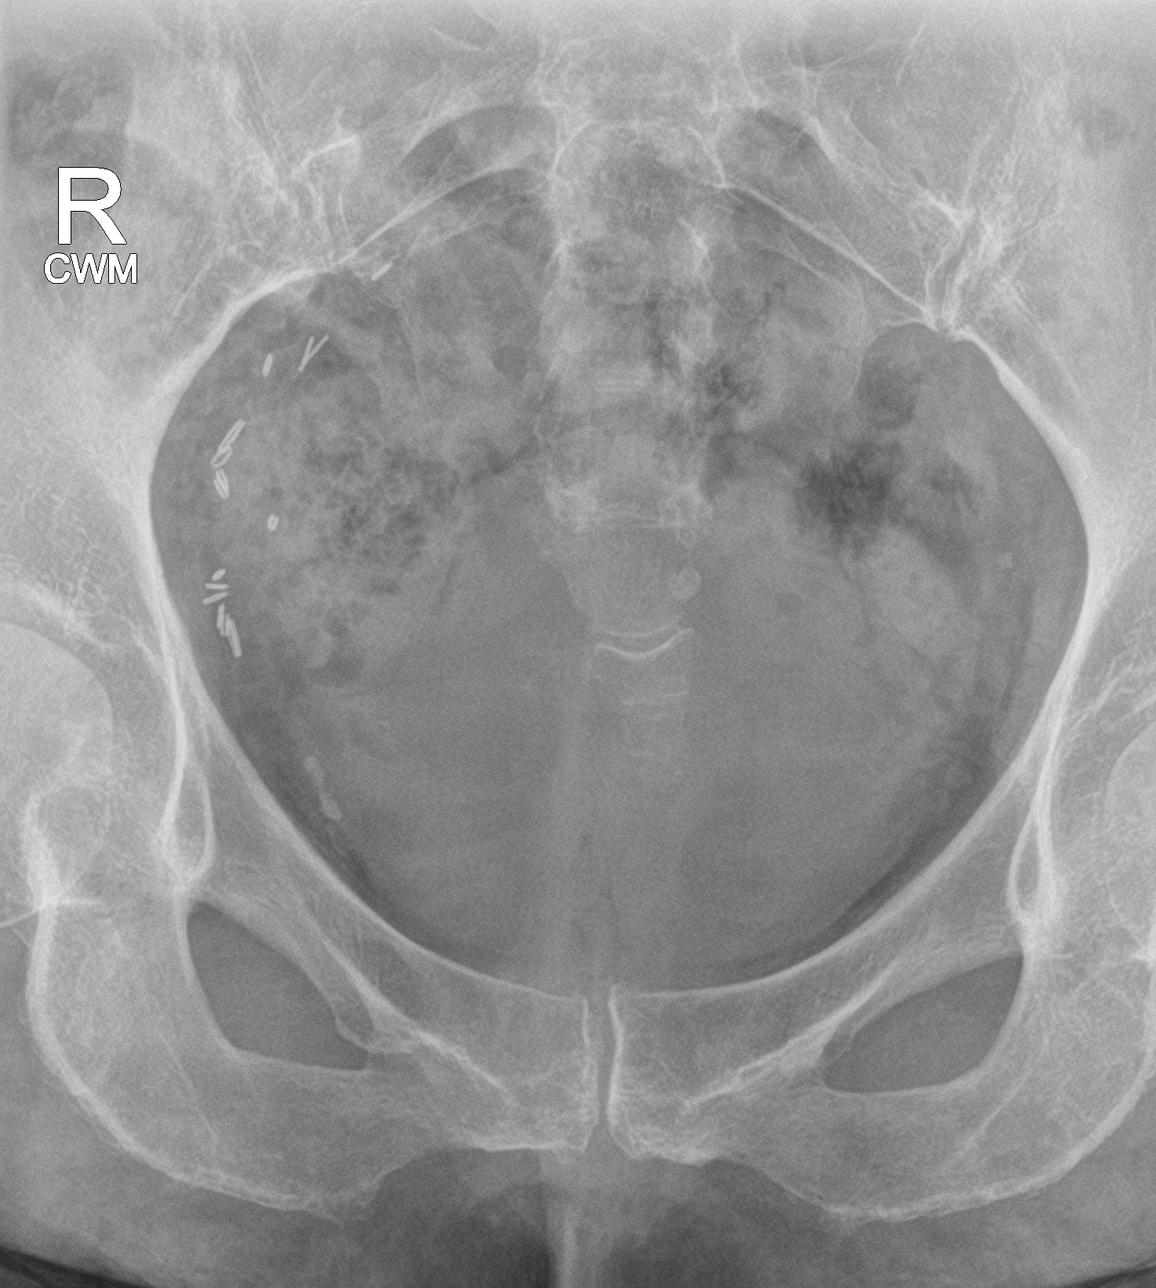

[sacrum ap]
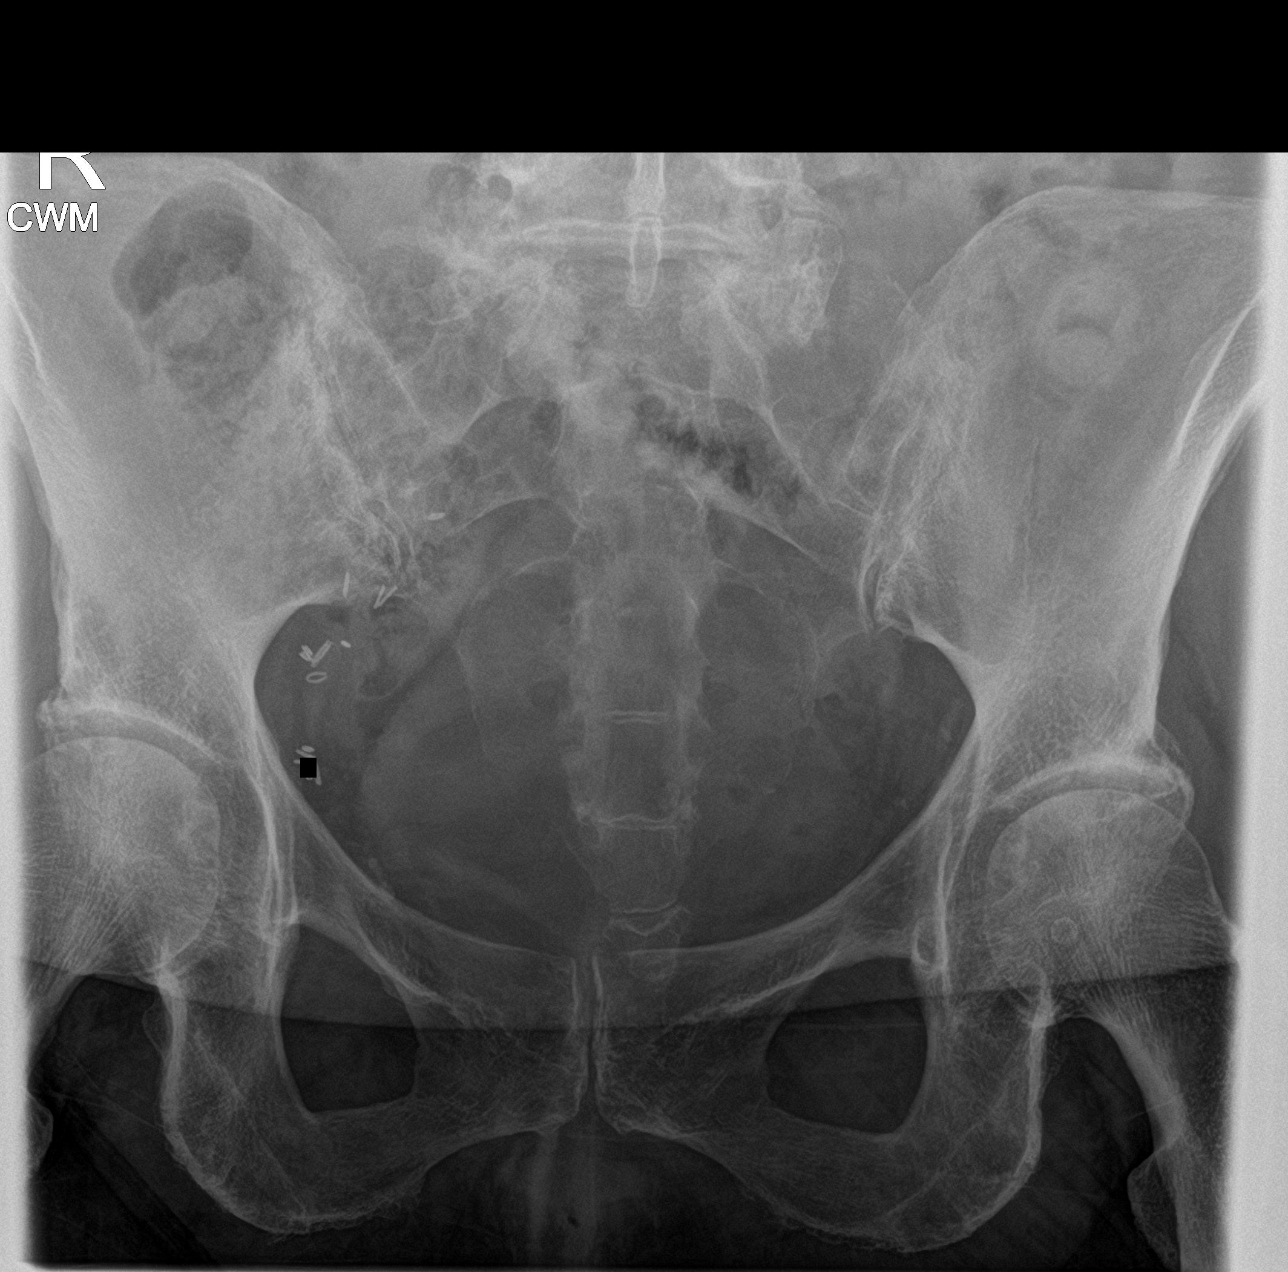

[sacrum lat]
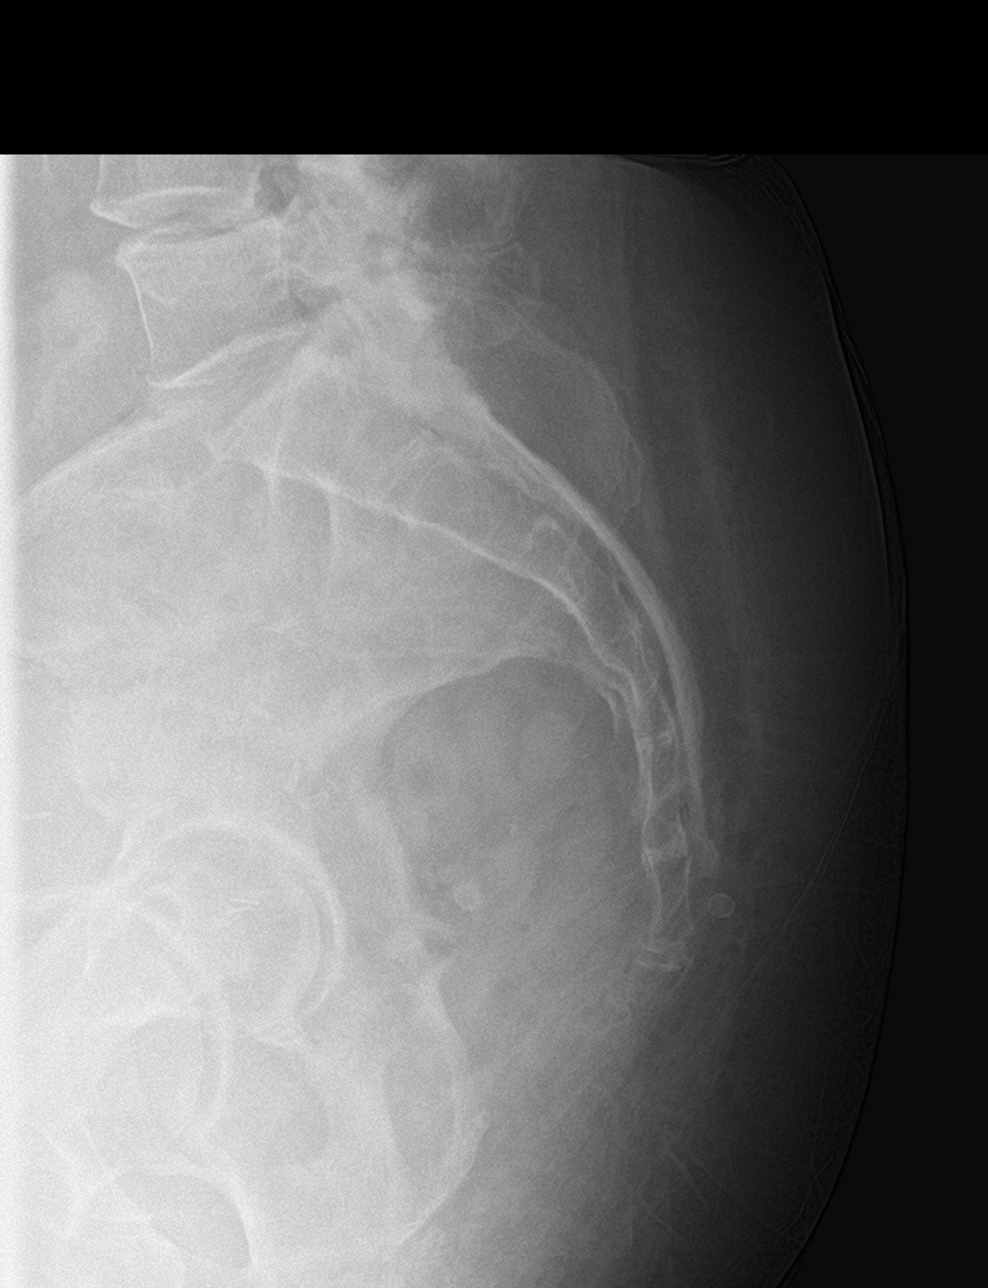

[3 of 3 positions shown; findings below may reference images not displayed]

FINDINGS: There are multilevel degenerative changes of the lumbar spine and
bilateral sacroiliac joints. There are mild degenerative changes of
both hips. There is no displaced fracture. No dislocation.
IMPRESSION: No acute osseous abnormality.

## 2019-12-06 MED ORDER — AMLODIPINE BESYLATE 5 MG PO TABS
5.0000 mg | ORAL_TABLET | Freq: Once | ORAL | Status: AC
  Administered 2019-12-06: 5 mg via ORAL
  Filled 2019-12-06: qty 1

## 2019-12-06 NOTE — ED Notes (Signed)
CBG 67, PT provided with sandwich and orange juice. RN informed.

## 2019-12-06 NOTE — ED Provider Notes (Signed)
[facility] EMERGENCY DEPARTMENT Provider Note   CSN: [id number] Arrival date & time: 12/06/19  1448     History Chief Complaint  Patient presents with  . Fall    [name] [name] is a 74 y.o. female who presents with a fall. She states that she takes Xanax at nighttime and in the morning she will have to sit on the side of her bed for a while before she gets up. She took her time and used her walker to go to the bathroom. She got up and then went to use her walker and it was "like a tornado hit me". She reports a severe episode of dizziness that caused her to lose her balance. She fell backward and hit her head. She wasn't able to get off the floor and her husband called EMS. They advised her to go to UC or the ED to be checked out. She went to [facility] but they told her to come to the ED for a CT of her head. She states that she is having a moderate headache over the back of her head. She is also having some pain over her tailbone. She denies any dizziness currently. She does not take blood thinners. She has a tremor at baseline which she attributes to the psych medicine that she is on. She has difficulty walking at baseline due to the tremors and neuropathy in her legs.  HPI     Past Medical History:  Diagnosis Date  . Allergic rhinitis   . Bipolar disorder ([place])   . Cholelithiasis    seen on CAT 01/04/09  . Chronic renal disease, stage III    3/12  . Complication of anesthesia    versed and fentanyl did not work for colonoscopy  . DM (diabetes mellitus) ([place])   . Edema   . Gait abnormality 08/26/2019  . HLD (hyperlipidemia)   . HTN (hypertension)   . Iron deficiency anemia    3/12   . Lymphedema    tarda  . Neuropathy   . Obstructive sleep apnea on CPAP   . Osteoarthritis   . Renal cyst    2.7 cm seen on CAT 01/04/09  . Tardive dyskinesia     Patient Active Problem List   Diagnosis Date Noted  . Gait abnormality 08/26/2019  . Unspecified gastritis and  gastroduodenitis without mention of hemorrhage 07/09/2011  . Personal history of failed moderate sedation 05/20/2011  . ACUTE CYSTITIS 02/23/2008  . HYPERLIPIDEMIA 10/30/2007  . DIABETES MELLITUS, TYPE II 07/08/2007  . Iron deficiency anemia, unspecified 07/08/2007  . DEPRESSION 07/08/2007  . HYPERTENSION 07/08/2007    Past Surgical History:  Procedure Laterality Date  . ABDOMINAL HYSTERECTOMY    . COLONOSCOPY  2009; 07/09/11   2009:normal (polyp was not adenoma); 2012:   . DILATION AND CURETTAGE OF UTERUS    . ESOPHAGOGASTRODUODENOSCOPY  07/09/11   Erosive gastritis  . FRACTURE SURGERY     rt fx upper arm  . GIVENS CAPSULE STUDY  07/25/2011   normal small bowel  . REIMPLANT URETER IN BLADDER  1996  . TRIGGER FINGER RELEASE Left 02/04/2013   Procedure: RELEASE TRIGGER FINGER/A-1 PULLEY LEFT RING FINGER;  Surgeon: Tennis Must, MD;  Location: [facility];  Service: Orthopedics;  Laterality: Left;     OB History   No obstetric history on file.     Family History  Problem Relation Age of Onset  . Diabetes Mother   . Hyperlipidemia Mother   .  Heart disease Father   . Heart attack Father   . Colon cancer Neg Hx     Social History   Tobacco Use  . Smoking status: Former Smoker    Types: Cigarettes    Quit date: 02/02/1991    Years since quitting: 28.8  . Smokeless tobacco: Never Used  Substance Use Topics  . Alcohol use: Not Currently    Comment: less than 1 a day  . Drug use: No    Home Medications Prior to Admission medications   Medication Sig Start Date End Date Taking? Authorizing Provider  ALPRAZolam Duanne Moron) 1 MG tablet Take 1 mg by mouth 2 (two) times daily as needed for anxiety.   Yes [provider]  amLODipine (NORVASC) 5 MG tablet Take 5 mg by mouth daily.    Yes [provider]  ferrous sulfate 325 (65 FE) MG tablet Take 325 mg by mouth daily with breakfast.     Yes [provider]  lamoTRIgine (LAMICTAL) 200 MG  tablet Take 300 mg by mouth daily.   Yes [provider]  metFORMIN (GLUCOPHAGE) 1000 MG tablet Take 1,000 mg by mouth 2 (two) times daily with a meal.    Yes [provider]  sertraline (ZOLOFT) 100 MG tablet Take 100 mg by mouth 2 (two) times daily.   Yes [provider]  simvastatin (ZOCOR) 40 MG tablet Take 40 mg by mouth daily.   Yes [provider]  Valbenazine Tosylate (INGREZZA) 80 MG CAPS Take 80 mg by mouth daily.    Yes [provider]    Allergies    Latuda [lurasidone]  Review of Systems   Review of Systems  Constitutional: Negative for fever.  Respiratory: Negative for shortness of breath.   Cardiovascular: Negative for chest pain.  Gastrointestinal: Negative for abdominal pain.  Musculoskeletal: Positive for back pain (tailbone). Negative for neck pain.  Neurological: Positive for dizziness and headaches. Negative for syncope, weakness and numbness.  All other systems reviewed and are negative.   Physical Exam Updated Vital Signs BP (!) 170/91 (BP Location: Right Arm)   Pulse 67   Temp 98.8 F (37.1 C) (Oral)   Resp 16   Ht 5\' 3"  (1.6 m)   Wt 98.9 kg   SpO2 97%   BMI 38.62 kg/m   Physical Exam Vitals and nursing note reviewed.  Constitutional:      General: She is not in acute distress.    Appearance: She is well-developed. She is obese. She is not ill-appearing.  HENT:     Head: Normocephalic and atraumatic.  Eyes:     General: No scleral icterus.       Right eye: No discharge.        Left eye: No discharge.     Conjunctiva/sclera: Conjunctivae normal.     Pupils: Pupils are equal, round, and reactive to light.  Neck:     Comments: No midline tenderness Cardiovascular:     Rate and Rhythm: Normal rate and regular rhythm.  Pulmonary:     Effort: Pulmonary effort is normal. No respiratory distress.     Breath sounds: Normal breath sounds.  Abdominal:     General: There is no distension.     Palpations:  Abdomen is soft.     Tenderness: There is no abdominal tenderness.  Musculoskeletal:     Cervical back: Normal range of motion.     Comments: No upper or lower back tenderness. Tenderness over the sacrum  Skin:  General: Skin is warm and dry.  Neurological:     Mental Status: She is alert and oriented to person, place, and time.     Comments: Lying on stretcher in NAD. GCS 15. Speaks in a clear voice. Cranial nerves II through XII grossly intact. 5/5 strength in all extremities. Sensation fully intact.  Bilateral finger-to-nose intact although tremor noted bilaterally. Gait not tested   Psychiatric:        Behavior: Behavior normal.     ED Results / Procedures / Treatments   Labs (all labs ordered are listed, but only abnormal results are displayed) Labs Reviewed  BASIC METABOLIC PANEL - Abnormal; Notable for the following components:      Result Value   Creatinine, Ser 1.31 (*)    GFR calc non Af Amer 40 (*)    GFR calc Af Amer 47 (*)    All other components within normal limits  CBG MONITORING, ED - Abnormal; Notable for the following components:   Glucose-Capillary 67 (*)    All other components within normal limits  CBC WITH DIFFERENTIAL/PLATELET  CBG MONITORING, ED    EKG EKG Interpretation  Date/Time:  Monday December 06 2019 22:26:31 EST Ventricular Rate:  67 PR Interval:    QRS Duration: 141 QT Interval:  457 QTC Calculation: 483 R Axis:   -71 Text Interpretation: Sinus rhythm RBBB and LAFB No acute changes No significant change since last tracing possible u wave Confirmed by Varney Biles [phone]) on 12/06/2019 11:41:12 PM Also confirmed by Varney Biles [phone]), editor Victory Dakin [phone])  on 12/07/2019 9:00:13 AM   Radiology DG Sacrum/Coccyx  Result Date: 12/06/2019 CLINICAL DATA:  Tailbone. EXAM: SACRUM AND COCCYX - 2+ VIEW COMPARISON:  None. FINDINGS: There are multilevel degenerative changes of the lumbar spine and bilateral sacroiliac joints. There  are mild degenerative changes of both hips. There is no displaced fracture. No dislocation. IMPRESSION: No acute osseous abnormality. Electronically Signed   By: Constance Holster M.D.   On: 12/06/2019 22:21   CT Head Wo Contrast  Result Date: 12/06/2019 CLINICAL DATA:  Status post fall. EXAM: CT HEAD WITHOUT CONTRAST TECHNIQUE: Contiguous axial images were obtained from the base of the skull through the vertex without intravenous contrast. COMPARISON:  None. FINDINGS: Brain: There is mild cerebral atrophy with widening of the extra-axial spaces and ventricular dilatation. There are areas of decreased attenuation within the white matter tracts of the supratentorial brain, consistent with microvascular disease changes. Vascular: No hyperdense vessel or unexpected calcification. Skull: Normal. Negative for fracture or focal lesion. Sinuses/Orbits: No acute finding. Other: None. IMPRESSION: No acute intracranial pathology. Electronically Signed   By: Virgina Norfolk M.D.   On: 12/06/2019 22:19    Procedures Procedures (including critical care time)  Medications Ordered in ED Medications  amLODipine (NORVASC) tablet 5 mg (5 mg Oral Given 12/06/19 2250)    ED Course  I have reviewed the triage vital signs and the nursing notes.  Pertinent labs & imaging results that were available during my care of the patient were reviewed by me and considered in my medical decision making (see chart for details).  74 year old female presents with a fall after severe episode of dizziness. She is hypertensive tonight but has missed her nighttime BP med. Otherwise vitals are normal. Neuro exam is grossly normal. Dizziness has resolved. Sounds vertiginous in nature. Will obtain EKG, labs, CT head, and xray of coccyx.  EKG is SR with no acute change. Labs show mild elevation  of Scr (1.3). CT head and xray are negative. Shared visit with Dr Kathrynn Humble. Encouraged increase fluid intake and to make f/u appt with her  doctor to have this rechecked although we don't have a recent BMP so could be progression of CKD as well.    MDM Rules/Calculators/A&P                       Final Clinical Impression(s) / ED Diagnoses Final diagnoses:  Fall, initial encounter  Injury of head, initial encounter  Contusion of lower back, initial encounter  Dizziness    Rx / DC Orders ED Discharge Orders    None       Recardo Evangelist, PA-C 12/07/19 [place], MD 12/11/19 1758

## 2019-12-06 NOTE — ED Triage Notes (Addendum)
Pt sent to ED from Urgent Care after a fall at her home.  Pt st's she has a knot on her head   No LOC  Pt is not on any blood thinners

## 2019-12-06 NOTE — ED Notes (Signed)
Phlebotomy aware nursing unable to obtain blood, will attempt when pt returns from CT

## 2019-12-06 NOTE — ED Triage Notes (Signed)
Pt fall today after she finishing using the bathroom. Pt has a knot on her head and a sore tailbone. The EMS were called to patient home as well. They did a EKG, pt did not loss conscious.

## 2019-12-06 NOTE — ED Notes (Signed)
Unable to obtain blood x2 attempts. Pt transported to CT.

## 2019-12-07 ENCOUNTER — Other Ambulatory Visit: Payer: Medicare Other

## 2019-12-07 ENCOUNTER — Ambulatory Visit: Payer: Medicare Other

## 2019-12-07 DIAGNOSIS — Z23 Encounter for immunization: Secondary | ICD-10-CM

## 2019-12-07 DIAGNOSIS — S0990XA Unspecified injury of head, initial encounter: Secondary | ICD-10-CM | POA: Diagnosis not present

## 2019-12-07 NOTE — Discharge Instructions (Signed)
Your EKG and imaging of your head and tailbone are normal The blood work shows you have mild kidney disease. Please make sure you drink plenty of fluids and follow up with your primary doctor about this Return if you are worsening

## 2019-12-07 NOTE — Progress Notes (Signed)
   Covid-19 Vaccination Clinic  Name:  ZANILAH DRAPKIN    MRN: [id number] DOB: 1946-02-24  12/07/2019  Ms. Bayon was observed post Covid-19 immunization for 15 minutes without incidence. She was provided with Vaccine Information Sheet and instruction to access the V-Safe system.   Ms. Zarza was instructed to call 911 with any severe reactions post vaccine: Marland Kitchen Difficulty breathing  . Swelling of your face and throat  . A fast heartbeat  . A bad rash all over your body  . Dizziness and weakness    Immunizations Administered    Name Date Dose VIS Date Route   Pfizer COVID-19 Vaccine 12/07/2019  4:14 PM 0.3 mL 10/29/2019 Intramuscular   Manufacturer: [place]   Lot: [id number]   [place]: [id number]

## 2019-12-07 NOTE — ED Notes (Signed)
Pt verbalized understanding of d/c instruction, follow up care and s/s requiring return to ED. reviewed need for blood sugar monitoring and increased water intake. Pt had no additional questions.

## 2019-12-08 ENCOUNTER — Telehealth: Payer: Self-pay | Admitting: Neurology

## 2019-12-08 NOTE — Telephone Encounter (Signed)
[name] [name] has called to report that [name] [name] had a fall last Monday and refused visits as a result of the fall.  [name] [name] is asking for verbal orders of 1 week 1 starting next week.

## 2019-12-08 NOTE — Telephone Encounter (Addendum)
I spoke to the patient's husband, Jori Moll. States the patient had a fall on 12/06/2019. He took her to the ED to have her checked due to her hitting her head. Says they did some x-rays, along with a CT of her head, that turned out okay. She is sore this week and declined PT. They are in agreement for PT to re-start next week.   I also spoke to [place] at [facility] who already has them scheduled.

## 2019-12-08 NOTE — Telephone Encounter (Signed)
Left a message for [name] [name] at [facility] requesting call back to get more information about the patient's reported fall.

## 2019-12-14 DIAGNOSIS — E1140 Type 2 diabetes mellitus with diabetic neuropathy, unspecified: Secondary | ICD-10-CM | POA: Diagnosis not present

## 2019-12-14 DIAGNOSIS — E1122 Type 2 diabetes mellitus with diabetic chronic kidney disease: Secondary | ICD-10-CM | POA: Diagnosis not present

## 2019-12-14 DIAGNOSIS — G2401 Drug induced subacute dyskinesia: Secondary | ICD-10-CM | POA: Diagnosis not present

## 2019-12-14 DIAGNOSIS — F319 Bipolar disorder, unspecified: Secondary | ICD-10-CM | POA: Diagnosis not present

## 2019-12-14 DIAGNOSIS — I129 Hypertensive chronic kidney disease with stage 1 through stage 4 chronic kidney disease, or unspecified chronic kidney disease: Secondary | ICD-10-CM | POA: Diagnosis not present

## 2019-12-14 DIAGNOSIS — F419 Anxiety disorder, unspecified: Secondary | ICD-10-CM | POA: Diagnosis not present

## 2019-12-16 ENCOUNTER — Ambulatory Visit: Payer: Medicare Other

## 2019-12-16 ENCOUNTER — Telehealth: Payer: Self-pay | Admitting: Neurology

## 2019-12-16 NOTE — Telephone Encounter (Signed)
[facility] returned called and stating they arent putting patient on hold they are discharging patient

## 2019-12-16 NOTE — Telephone Encounter (Signed)
LEft vm for [facility] therapist [phone] to call back about therapy had to stop because pt still hurting from fall last week.

## 2019-12-16 NOTE — Telephone Encounter (Signed)
I called [facility] she stated pt was being discharge from therapy  due to recent fall and hurting from it. I stated message will be sent to Dr. Jannifer Franklin for [facility]. She verbalized understanding.

## 2019-12-16 NOTE — Telephone Encounter (Signed)
[name] [name] from [facility] called wanting the provider to know that the pt has been discharged due to Home health PT not meeting the goals because of a fall the pt had last week and her still hurting on her bottom. Please advise.

## 2019-12-17 NOTE — Telephone Encounter (Signed)
Events noted, the patient was seen in the ER following the fall, x-rays did not show fracture.  The patient will be seen in the next couple weeks in the office, if she is feeling better by then, we may be able to restart physical therapy.

## 2019-12-20 ENCOUNTER — Other Ambulatory Visit: Payer: Self-pay | Admitting: Geriatric Medicine

## 2019-12-20 ENCOUNTER — Ambulatory Visit
Admission: RE | Admit: 2019-12-20 | Discharge: 2019-12-20 | Disposition: A | Discharge status: Home / Self Care | Payer: Medicare Other | Source: Ambulatory Visit | Attending: Geriatric Medicine | Admitting: Geriatric Medicine

## 2019-12-20 DIAGNOSIS — I10 Essential (primary) hypertension: Secondary | ICD-10-CM | POA: Diagnosis not present

## 2019-12-20 DIAGNOSIS — R269 Unspecified abnormalities of gait and mobility: Secondary | ICD-10-CM | POA: Diagnosis not present

## 2019-12-20 DIAGNOSIS — G44309 Post-traumatic headache, unspecified, not intractable: Secondary | ICD-10-CM

## 2019-12-20 DIAGNOSIS — E1121 Type 2 diabetes mellitus with diabetic nephropathy: Secondary | ICD-10-CM | POA: Diagnosis not present

## 2019-12-20 DIAGNOSIS — E113293 Type 2 diabetes mellitus with mild nonproliferative diabetic retinopathy without macular edema, bilateral: Secondary | ICD-10-CM | POA: Diagnosis not present

## 2019-12-20 DIAGNOSIS — Z7984 Long term (current) use of oral hypoglycemic drugs: Secondary | ICD-10-CM | POA: Diagnosis not present

## 2019-12-20 DIAGNOSIS — E1140 Type 2 diabetes mellitus with diabetic neuropathy, unspecified: Secondary | ICD-10-CM | POA: Diagnosis not present

## 2019-12-20 DIAGNOSIS — S0990XA Unspecified injury of head, initial encounter: Secondary | ICD-10-CM | POA: Diagnosis not present

## 2019-12-20 DIAGNOSIS — M546 Pain in thoracic spine: Secondary | ICD-10-CM | POA: Diagnosis not present

## 2019-12-20 DIAGNOSIS — E7800 Pure hypercholesterolemia, unspecified: Secondary | ICD-10-CM | POA: Diagnosis not present

## 2019-12-20 DIAGNOSIS — N1831 Chronic kidney disease, stage 3a: Secondary | ICD-10-CM | POA: Diagnosis not present

## 2019-12-20 DIAGNOSIS — R519 Headache, unspecified: Secondary | ICD-10-CM | POA: Diagnosis not present

## 2019-12-20 DIAGNOSIS — Z79899 Other long term (current) drug therapy: Secondary | ICD-10-CM | POA: Diagnosis not present

## 2019-12-20 IMAGING — CT CT HEAD W/O CM
4 series · 17 of 47 positions shown, 19 images · non-contrast
Comparison: [DATE]

CLINICAL DATA: Persistent headaches after fall [DATE], weakness
and visual changes

EXAM:
CT HEAD WITHOUT CONTRAST
TECHNIQUE: Contiguous axial images were obtained from the base of the skull
through the vertex without intravenous contrast.

[Series 2: head 5.00 hr40 s3 axial ibhc · axial · 0.46mm/px · z∈[-557,-432]mm · 7 of 35 slices shown, 9 images]
[im 5/35  brain]
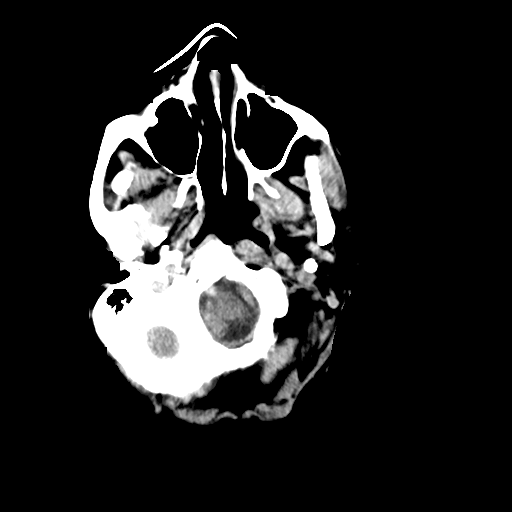
[im 5/35  bone]
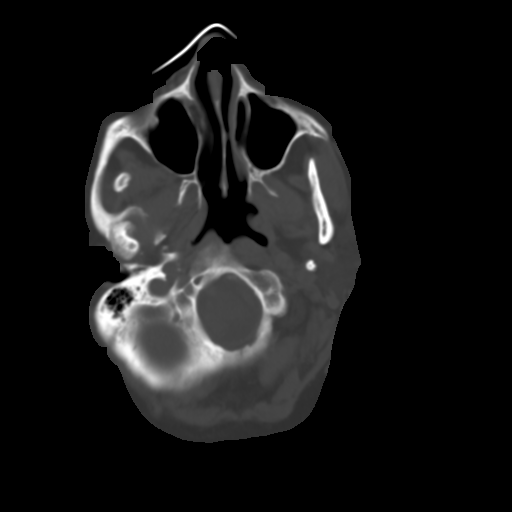
[im 9/35  brain]
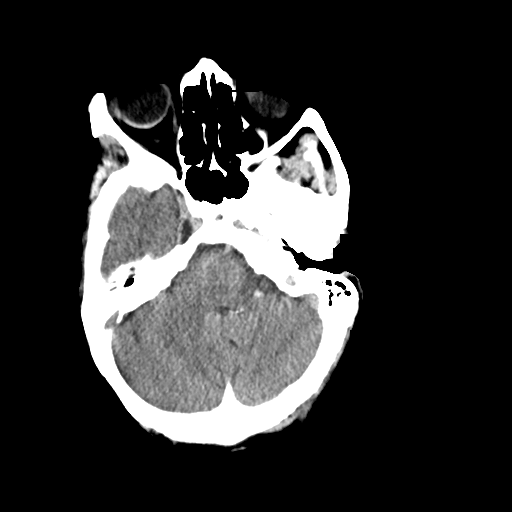
[im 13/35  brain]
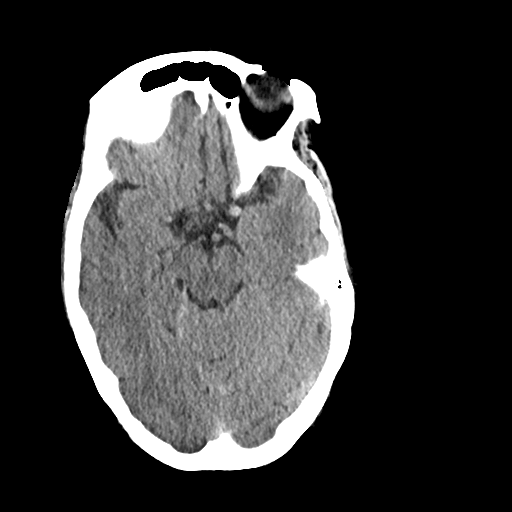
[im 18/35  brain]
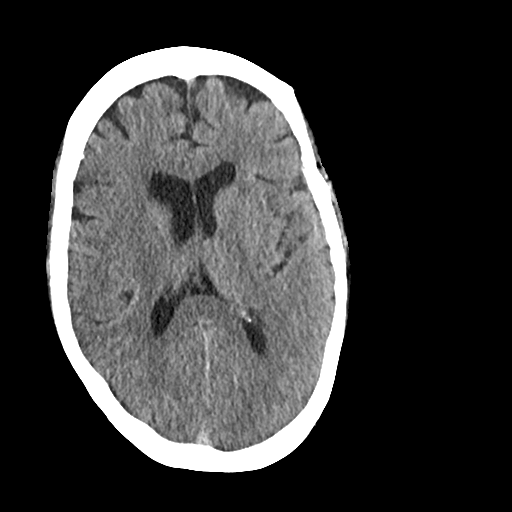
[im 22/35  brain]
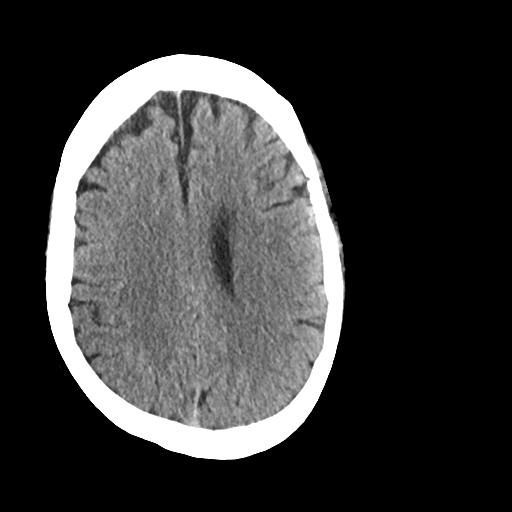
[im 22/35  bone]
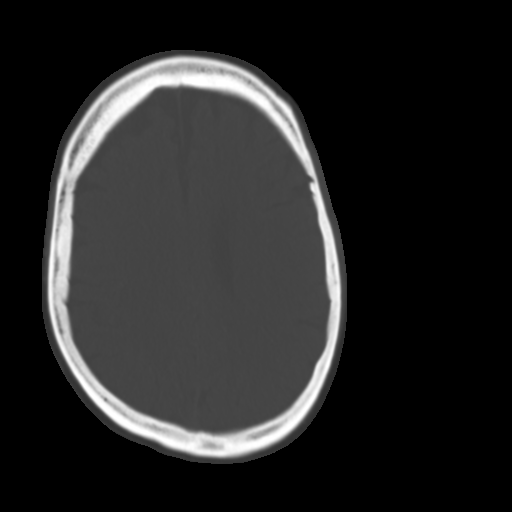
[im 26/35  brain]
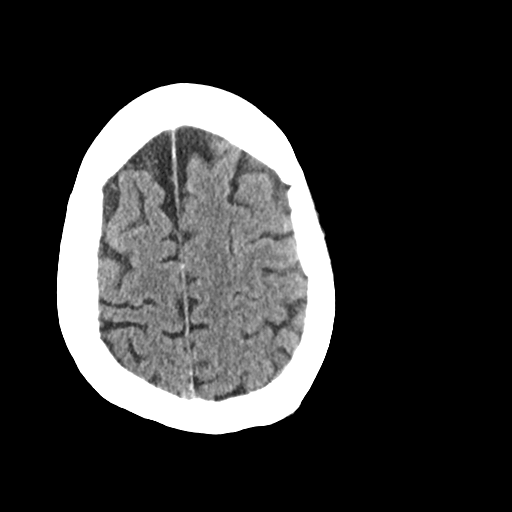
[im 30/35  brain]
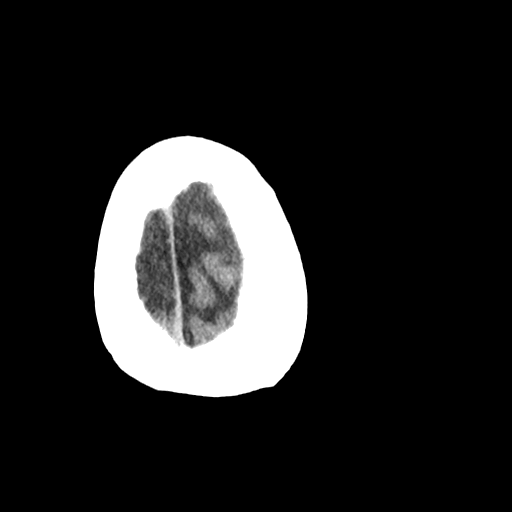

[Series 3: head 2.00 hr60 s3 axial bone · axial · 0.46mm/px · z∈[-562,-502]mm · 4 of 87 slices shown]
[im 9/87  bone]
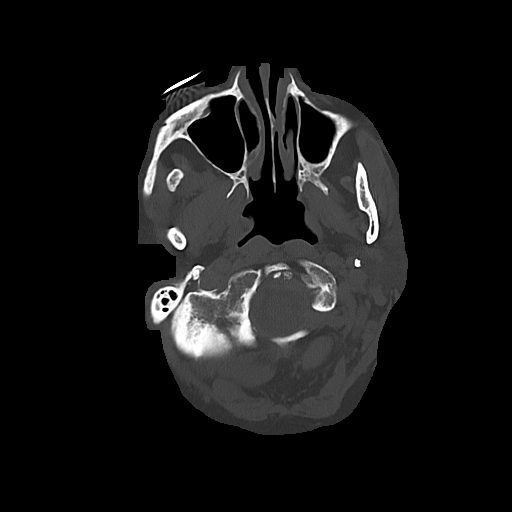
[im 18/87  bone]
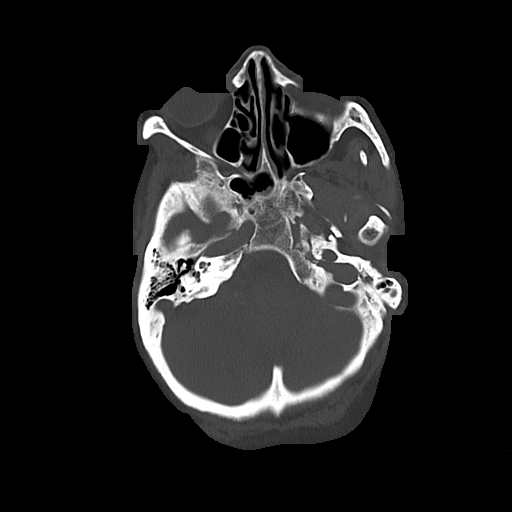
[im 26/87  bone]
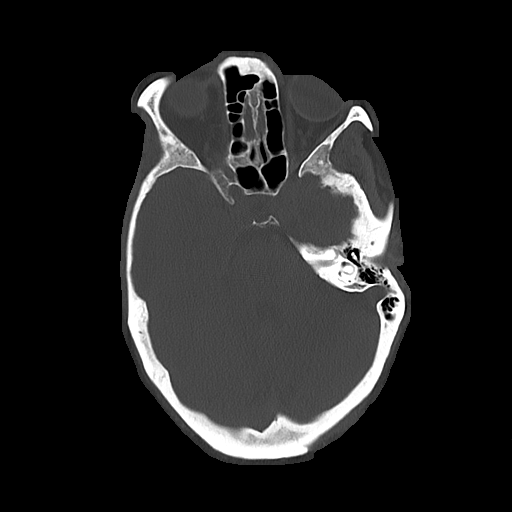
[im 39/87  bone]
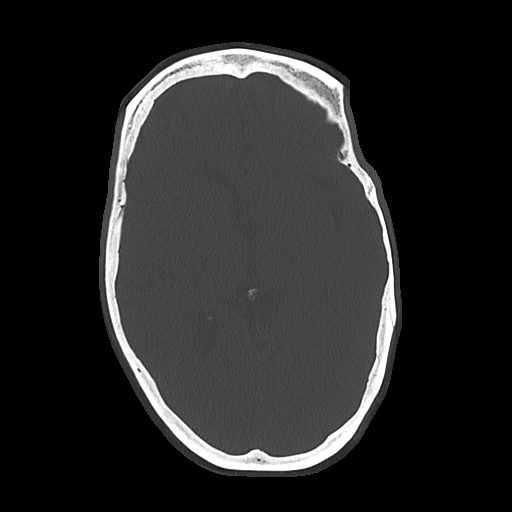

[Series 4: head 3.00 hr40 s3 sag · sagittal · 0.34mm/px · 3 of 83 slices shown]
[im 33/83  brain]
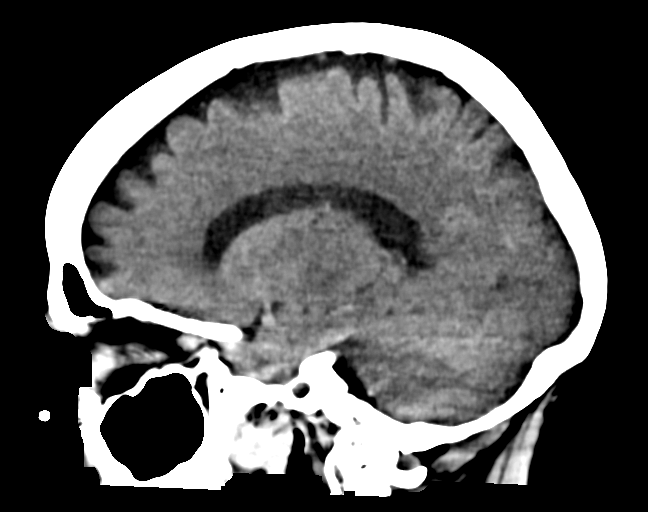
[im 42/83  brain]
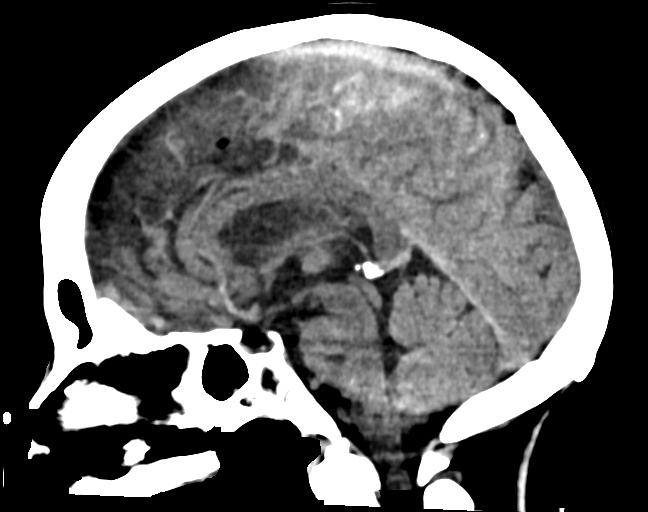
[im 50/83  brain]
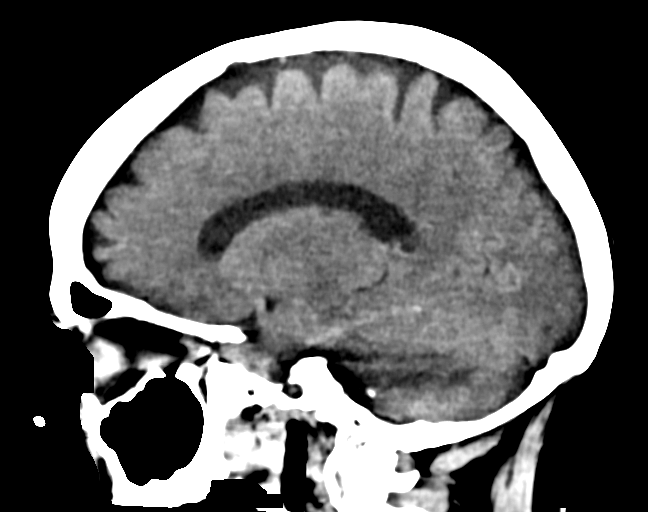

[Series 6: head 3.00 hr40 s3 cor · coronal · 0.33mm/px · 3 of 109 slices shown]
[im 37/109  brain]
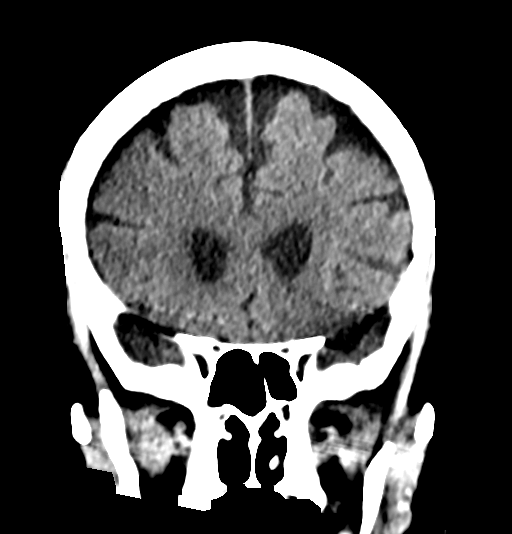
[im 49/109  brain]
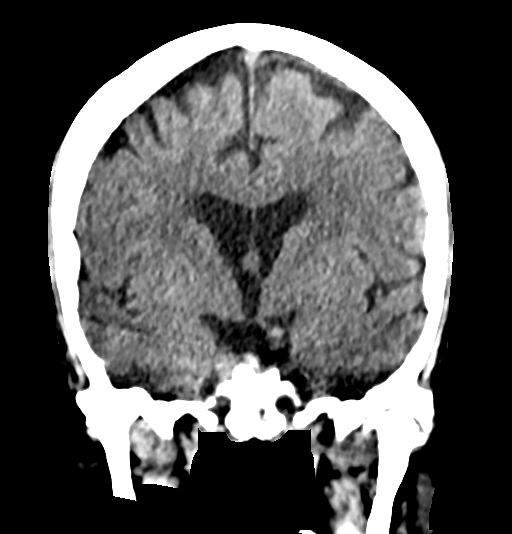
[im 61/109  brain]
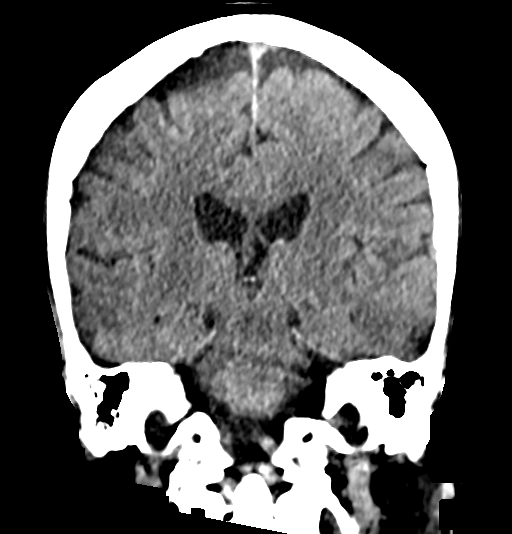

[17 of 47 positions shown; findings below may reference images not displayed]

FINDINGS: Brain: No evidence of acute infarction, hemorrhage, hydrocephalus,
extra-axial collection or mass lesion/mass effect.

Vascular: No hyperdense vessel or unexpected calcification.

Skull: Normal. Negative for fracture or focal lesion.

Sinuses/Orbits: No acute finding.

Other: Globes and orbits appear unremarkable.
IMPRESSION: 1. Stable exam, no acute intracranial process.

## 2019-12-27 ENCOUNTER — Ambulatory Visit: Payer: Medicare Other

## 2019-12-27 ENCOUNTER — Ambulatory Visit: Payer: Medicare Other | Attending: Internal Medicine

## 2019-12-27 DIAGNOSIS — Z23 Encounter for immunization: Secondary | ICD-10-CM | POA: Insufficient documentation

## 2019-12-27 NOTE — Progress Notes (Signed)
   Covid-19 Vaccination Clinic  Name:  [name] [name]    MRN: [id number] DOB: [birth year]  12/27/2019  [name] [name] was observed post Covid-19 immunization for 15 minutes without incidence. She was provided with Vaccine Information Sheet and instruction to access the V-Safe system.   [name] [name] was instructed to call 911 with any severe reactions post vaccine: Marland Kitchen Difficulty breathing  . Swelling of your face and throat  . A fast heartbeat  . A bad rash all over your body  . Dizziness and weakness    Immunizations Administered    Name Date Dose VIS Date Route   Pfizer COVID-19 Vaccine 12/27/2019  2:56 PM 0.3 mL 10/29/2019 Intramuscular   Manufacturer: [place]   Lot: [id number]   [place]: [id number]

## 2020-01-05 ENCOUNTER — Other Ambulatory Visit: Payer: Self-pay

## 2020-01-05 ENCOUNTER — Encounter: Payer: Self-pay | Admitting: Neurology

## 2020-01-05 ENCOUNTER — Ambulatory Visit (INDEPENDENT_AMBULATORY_CARE_PROVIDER_SITE_OTHER): Payer: Medicare Other | Admitting: Neurology

## 2020-01-05 VITALS — BP 150/99 | HR 72 | Temp 97.4°F | Wt 215.0 lb

## 2020-01-05 DIAGNOSIS — G603 Idiopathic progressive neuropathy: Secondary | ICD-10-CM

## 2020-01-05 DIAGNOSIS — E538 Deficiency of other specified B group vitamins: Secondary | ICD-10-CM

## 2020-01-05 MED ORDER — INGREZZA 40 MG PO CAPS: 40.0000 mg | ORAL_CAPSULE | Freq: Every day | ORAL | Status: DC

## 2020-01-05 NOTE — Progress Notes (Signed)
Reason for visit: Diabetic peripheral neuropathy, tardive dyskinesia, tremor  [name] [name] is an 74 y.o. female  History of present illness:  [name] [name] is a 74 year old right-handed white female with a history of tardive dyskinesia associated with the use of antipsychotic medications. The patient has been taken off the medication and placed on Ingrezza, she was on 80 mg daily dose and doing well with her tardive movements. After being on the medication at that dosing level for 3 to 4 months, she began having trouble with headaches and vomiting. Was felt that the Ingrezza was the cause of this and the medication was reduced to 40 mg daily which alleviated the symptoms. The patient still has some tardive movements of the mouth and tongue, she has tremors involving both arms but this has improved over time. The patient has a chronic gait disorder associated with a significant diabetic neuropathy, she has numbness and stinging sensations in her feet and has numbness up to the knees. The patient was in the emergency room on 06 December 2019 following a fall. She had gotten up out of bed and had onset of severe vertigo and then fell. This has not recurred. The patient has undergone some physical therapy for gait training recently. She returns to this office for an evaluation.  Past Medical History:  Diagnosis Date  . Allergic rhinitis   . Bipolar disorder ([place])   . Cholelithiasis    seen on CAT 01/04/09  . Chronic renal disease, stage III    3/12  . Complication of anesthesia    versed and fentanyl did not work for colonoscopy  . DM (diabetes mellitus) ([place])   . Edema   . Gait abnormality 08/26/2019  . HLD (hyperlipidemia)   . HTN (hypertension)   . Iron deficiency anemia    3/12   . Lymphedema    tarda  . Neuropathy   . Obstructive sleep apnea on CPAP   . Osteoarthritis   . Renal cyst    2.7 cm seen on CAT 01/04/09  . Tardive dyskinesia     Past Surgical History:  Procedure  Laterality Date  . ABDOMINAL HYSTERECTOMY    . COLONOSCOPY  2009; 07/09/11   2009:normal (polyp was not adenoma); 2012:   . DILATION AND CURETTAGE OF UTERUS    . ESOPHAGOGASTRODUODENOSCOPY  07/09/11   Erosive gastritis  . FRACTURE SURGERY     rt fx upper arm  . GIVENS CAPSULE STUDY  07/25/2011   normal small bowel  . REIMPLANT URETER IN BLADDER  1996  . TRIGGER FINGER RELEASE Left 02/04/2013   Procedure: RELEASE TRIGGER FINGER/A-1 PULLEY LEFT RING FINGER;  Surgeon: Tennis Must, MD;  Location: [facility];  Service: Orthopedics;  Laterality: Left;    Family History  Problem Relation Age of Onset  . Diabetes Mother   . Hyperlipidemia Mother   . Heart disease Father   . Heart attack Father   . Colon cancer Neg Hx     Social history:  reports that she quit smoking about 28 years ago. Her smoking use included cigarettes. She has never used smokeless tobacco. She reports previous alcohol use. She reports that she does not use drugs.    Allergies  Allergen Reactions  . Latuda [Lurasidone] Other (See Comments)    Increased suicidal thoughts     Medications:  Prior to Admission medications   Medication Sig Start Date End Date Taking? Authorizing Provider  ALPRAZolam [name] [name]) 1 MG tablet Take  1 mg by mouth 2 (two) times daily as needed for anxiety.   Yes [provider]  amLODipine (NORVASC) 5 MG tablet Take 5 mg by mouth at bedtime.    Yes [provider]  ferrous sulfate 325 (65 FE) MG tablet Take 325 mg by mouth at bedtime.    Yes [provider]  lamoTRIgine (LAMICTAL) 200 MG tablet Take 300 mg by mouth daily.   Yes [provider]  metFORMIN (GLUCOPHAGE) 1000 MG tablet Take 1,000 mg by mouth 2 (two) times daily with a meal.    Yes [provider]  sertraline (ZOLOFT) 100 MG tablet Take 100 mg by mouth 2 (two) times daily.   Yes [provider]  simvastatin (ZOCOR) 40 MG tablet Take 40 mg by mouth at bedtime.    Yes  [provider]  Valbenazine Tosylate (INGREZZA) 80 MG CAPS Take 40 mg by mouth at bedtime.    Yes [provider]    ROS:  Out of a complete 14 system review of symptoms, the patient complains only of the following symptoms, and all other reviewed systems are negative.  Tremor walking difficulty numbness in the feet tardive movements  Blood pressure (!) 150/99, pulse 72, temperature (!) 97.4 F (36.3 C), weight 215 lb (97.5 kg).  Physical Exam  General: The patient is alert and cooperative at the time of the examination. The patient is moderately obese.  Skin: 1-2+ edema below the knees is seen bilaterally.   Neurologic Exam  Mental status: The patient is alert and oriented x 3 at the time of the examination. The patient has apparent normal recent and remote memory, with an apparently normal attention span and concentration ability.   Cranial nerves: Facial symmetry is present. Speech is normal, no aphasia or dysarthria is noted. Extraocular movements are full. Visual fields are full. Occasional tardive movements of the mouth and tongue are noted.  Motor: The patient has good strength in all 4 extremities.  Sensory examination: Soft touch sensation is symmetric on the face, arms, and legs.  Coordination: The patient has good finger-nose-finger and heel-to-shin bilaterally.  Gait and station: The patient has a slightly wide-based gait, she uses a walker for ambulation. She has good stability with a walker. Tandem gait was not attempted. Romberg is negative but is unsteady.  Reflexes: Deep tendon reflexes are symmetric, but are depressed.   CT head 12/20/19:  IMPRESSION: 1. Stable exam, no acute intracranial process.  * CT scan images were reviewed online. I agree with the written report.    Assessment/Plan:  1. Diabetic peripheral neuropathy  2. Tardive movement disorder  3. Gait disorder  The patient is to use the walker at all times. She  claims that she cannot get her walker into her bathroom at home, she may need a Rollator for this purpose. The patient does not appear to have true Parkinson's disease, her tremors have improved, she does not have masking of the face. We will see her once again in 6 months, if she remains stable, we will see her on an as-needed basis. She has done better with the lower dose of the Ingrezza. This is being prescribed through her psychiatry doctor.  Jill Alexanders MD 01/05/2020 10:02 AM  [facility] [address] [place], [place] [address]  Phone [phone] Fax [phone]

## 2020-01-10 DIAGNOSIS — E1142 Type 2 diabetes mellitus with diabetic polyneuropathy: Secondary | ICD-10-CM | POA: Diagnosis not present

## 2020-01-10 DIAGNOSIS — E1121 Type 2 diabetes mellitus with diabetic nephropathy: Secondary | ICD-10-CM | POA: Diagnosis not present

## 2020-01-10 DIAGNOSIS — I129 Hypertensive chronic kidney disease with stage 1 through stage 4 chronic kidney disease, or unspecified chronic kidney disease: Secondary | ICD-10-CM | POA: Diagnosis not present

## 2020-01-10 DIAGNOSIS — F3132 Bipolar disorder, current episode depressed, moderate: Secondary | ICD-10-CM | POA: Diagnosis not present

## 2020-01-10 DIAGNOSIS — E119 Type 2 diabetes mellitus without complications: Secondary | ICD-10-CM | POA: Diagnosis not present

## 2020-01-10 DIAGNOSIS — E7800 Pure hypercholesterolemia, unspecified: Secondary | ICD-10-CM | POA: Diagnosis not present

## 2020-01-10 LAB — MULTIPLE MYELOMA PANEL, SERUM
Albumin SerPl Elph-Mcnc: 4.1 g/dL (ref 2.9–4.4)
Albumin/Glob SerPl: 1.5 (ref 0.7–1.7)
Alpha 1: 0.3 g/dL (ref 0.0–0.4)
Alpha2 Glob SerPl Elph-Mcnc: 0.9 g/dL (ref 0.4–1.0)
B-Globulin SerPl Elph-Mcnc: 1.2 g/dL (ref 0.7–1.3)
Gamma Glob SerPl Elph-Mcnc: 0.6 g/dL (ref 0.4–1.8)
Globulin, Total: 2.9 g/dL (ref 2.2–3.9)
IgA/Immunoglobulin A, Serum: 248 mg/dL (ref 64–422)
IgG (Immunoglobin G), Serum: 683 mg/dL (ref 586–1602)
IgM (Immunoglobulin M), Srm: 51 mg/dL (ref 26–217)
Total Protein: 7 g/dL (ref 6.0–8.5)

## 2020-01-10 LAB — VITAMIN B12: Vitamin B-12: 397 pg/mL (ref 232–1245)

## 2020-01-10 LAB — ANGIOTENSIN CONVERTING ENZYME: Angio Convert Enzyme: 55 U/L (ref 14–82)

## 2020-01-10 LAB — B. BURGDORFI ANTIBODIES: Lyme IgG/IgM Ab: <0.91 {ISR} (ref 0.00–0.90)

## 2020-01-10 LAB — COPPER, SERUM: Copper: 119 ug/dL (ref 80–158)

## 2020-01-10 LAB — RPR: RPR Ser Ql: NONREACTIVE

## 2020-01-10 LAB — ANA W/REFLEX: Anti Nuclear Antibody (ANA): NEGATIVE

## 2020-01-20 DIAGNOSIS — M25511 Pain in right shoulder: Secondary | ICD-10-CM | POA: Diagnosis not present

## 2020-01-20 DIAGNOSIS — S42294A Other nondisplaced fracture of upper end of right humerus, initial encounter for closed fracture: Secondary | ICD-10-CM | POA: Diagnosis not present

## 2020-01-21 DIAGNOSIS — F3181 Bipolar II disorder: Secondary | ICD-10-CM | POA: Diagnosis not present

## 2020-01-27 DIAGNOSIS — S42294D Other nondisplaced fracture of upper end of right humerus, subsequent encounter for fracture with routine healing: Secondary | ICD-10-CM | POA: Diagnosis not present

## 2020-02-14 ENCOUNTER — Other Ambulatory Visit: Payer: Self-pay | Admitting: Geriatric Medicine

## 2020-02-14 DIAGNOSIS — R1319 Other dysphagia: Secondary | ICD-10-CM

## 2020-02-14 DIAGNOSIS — R1310 Dysphagia, unspecified: Secondary | ICD-10-CM

## 2020-02-17 DIAGNOSIS — S42294D Other nondisplaced fracture of upper end of right humerus, subsequent encounter for fracture with routine healing: Secondary | ICD-10-CM | POA: Diagnosis not present

## 2020-02-22 DIAGNOSIS — M6281 Muscle weakness (generalized): Secondary | ICD-10-CM | POA: Diagnosis not present

## 2020-02-22 DIAGNOSIS — M25511 Pain in right shoulder: Secondary | ICD-10-CM | POA: Diagnosis not present

## 2020-02-22 DIAGNOSIS — S42201D Unspecified fracture of upper end of right humerus, subsequent encounter for fracture with routine healing: Secondary | ICD-10-CM | POA: Diagnosis not present

## 2020-02-23 ENCOUNTER — Ambulatory Visit
Admission: RE | Admit: 2020-02-23 | Discharge: 2020-02-23 | Disposition: A | Discharge status: Home / Self Care | Payer: Medicare Other | Source: Ambulatory Visit | Attending: Geriatric Medicine | Admitting: Geriatric Medicine

## 2020-02-23 DIAGNOSIS — R1310 Dysphagia, unspecified: Secondary | ICD-10-CM

## 2020-02-23 DIAGNOSIS — K224 Dyskinesia of esophagus: Secondary | ICD-10-CM | POA: Diagnosis not present

## 2020-02-23 DIAGNOSIS — R1319 Other dysphagia: Secondary | ICD-10-CM

## 2020-02-23 IMAGING — RF DG ESOPHAGUS
9 of 10 series · 14 of 24 positions shown · non-contrast
Comparison: None.

CLINICAL DATA: Esophageal dysphagia with nausea.

EXAM:
ESOPHOGRAM/BARIUM SWALLOW
TECHNIQUE: Single contrast examination was performed using thin and thick
barium.
FLUOROSCOPY TIME:  Fluoroscopy Time: 2 minutes and 0 seconds of
low-dose pulsed fluoroscopy
Radiation Exposure Index (if provided by the fluoroscopic device):
Unavailable due to machine malfunction
Number of Acquired Spot Images: 0

[Series 1: sequence · 2 of 79 frames shown (1 of 6)]
[frame 12/79]
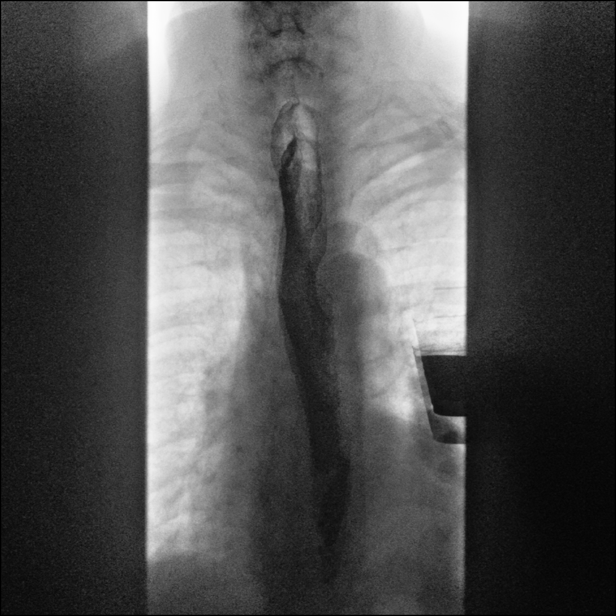
[frame 71/79]
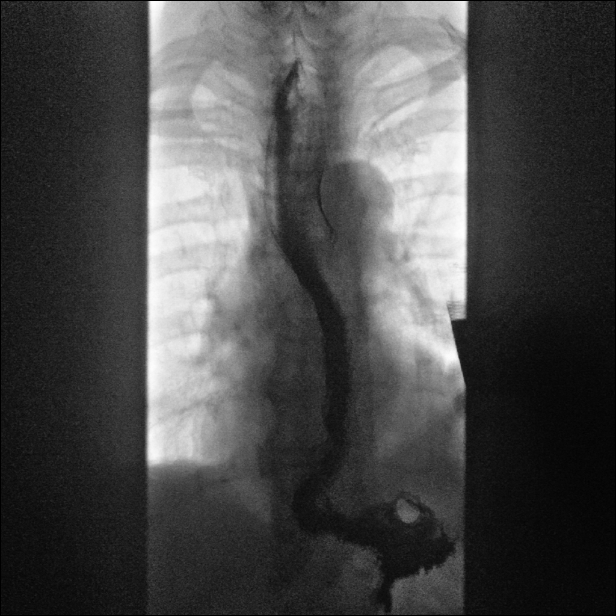

[Series 3: sequence · 2 of 47 frames shown (2 of 6)]
[frame 24/47]
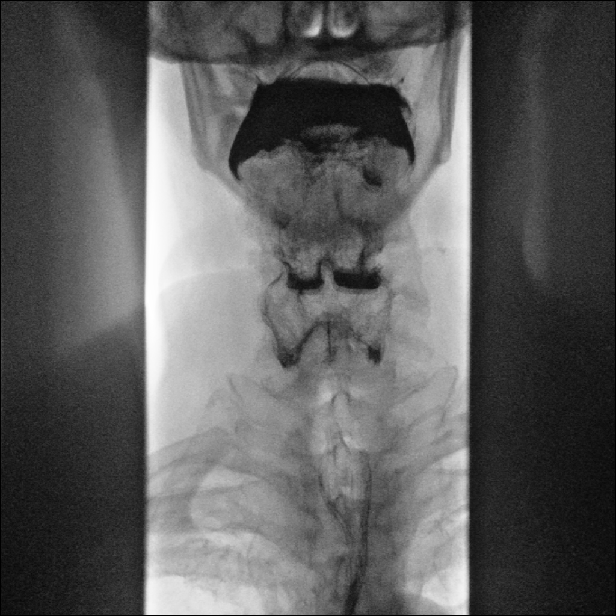
[frame 40/47]
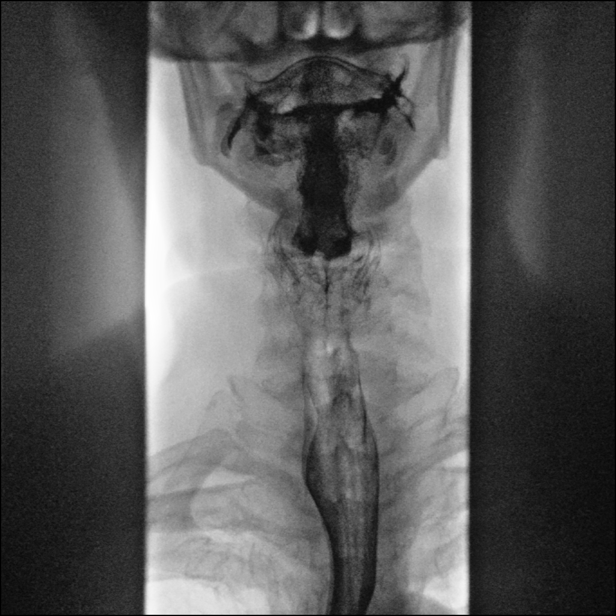

[Series 4: sequence · 1 of 37 frames shown (3 of 6)]
[frame 6/37]
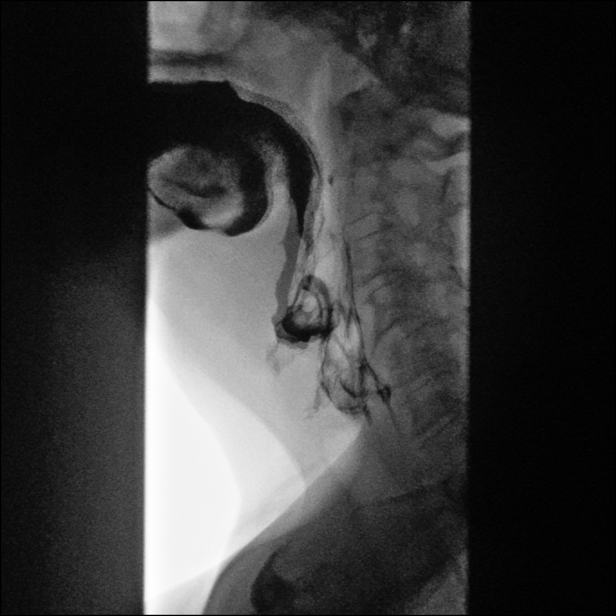

[Series 5: sequence · 2 of 57 frames shown (4 of 6)]
[frame 9/57]
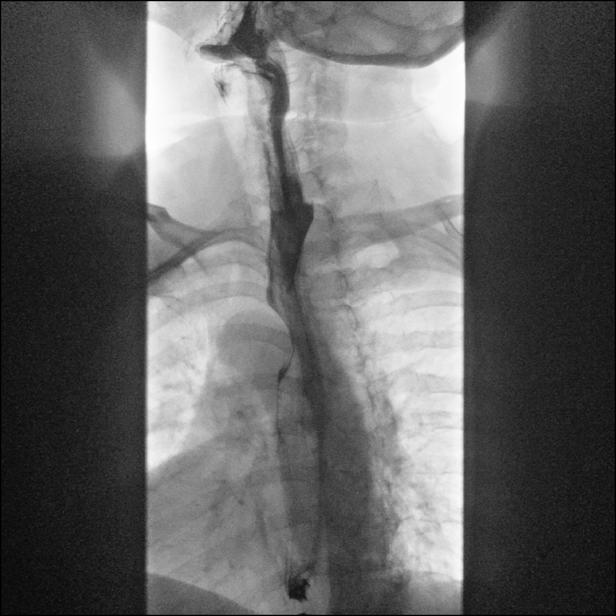
[frame 38/57]
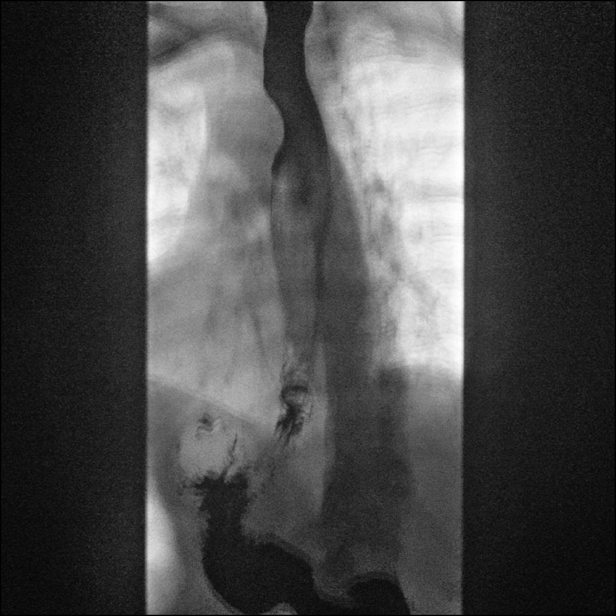

[Series 6: one shot · 1 of 1 slices shown (1 of 3)]
[im 1/1]
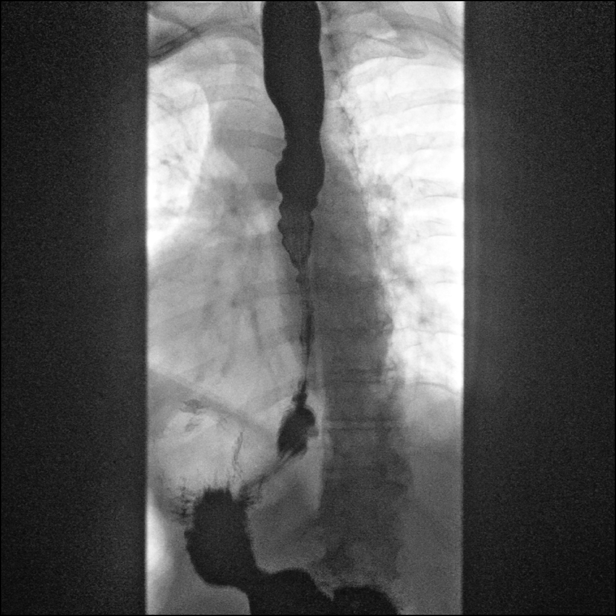

[Series 7: sequence · 1 of 42 frames shown (5 of 6)]
[frame 22/42]
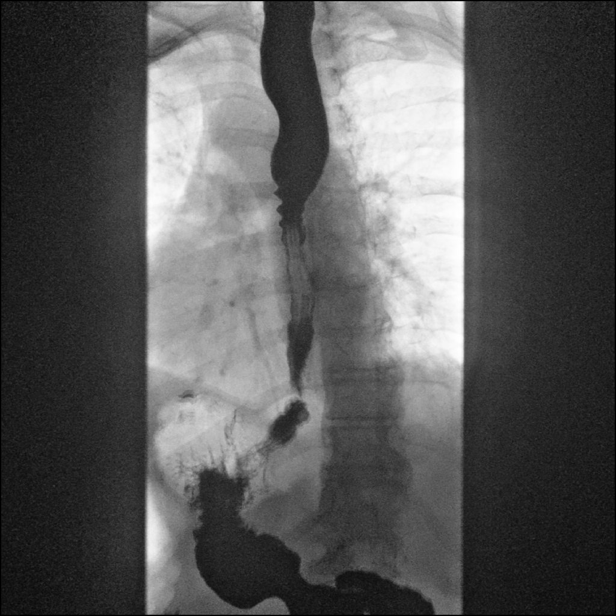

[Series 8: one shot · 2 of 5 slices shown (2 of 3)]
[im 2/5]
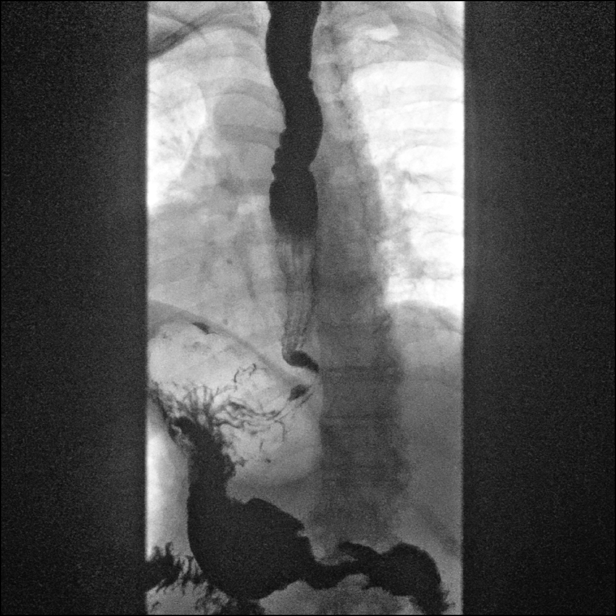
[im 5/5]
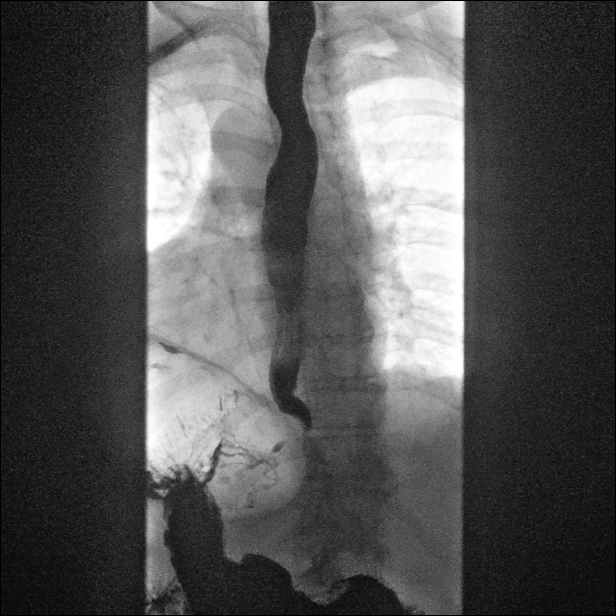

[Series 9: sequence · 2 of 20 frames shown (6 of 6)]
[frame 4/20]
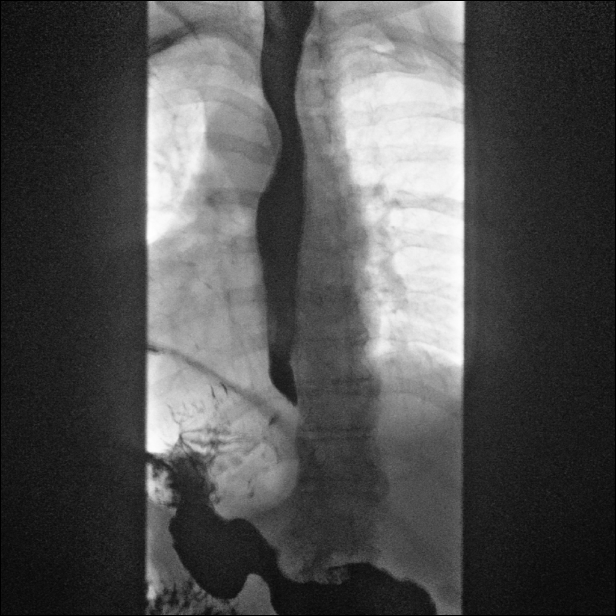
[frame 18/20]
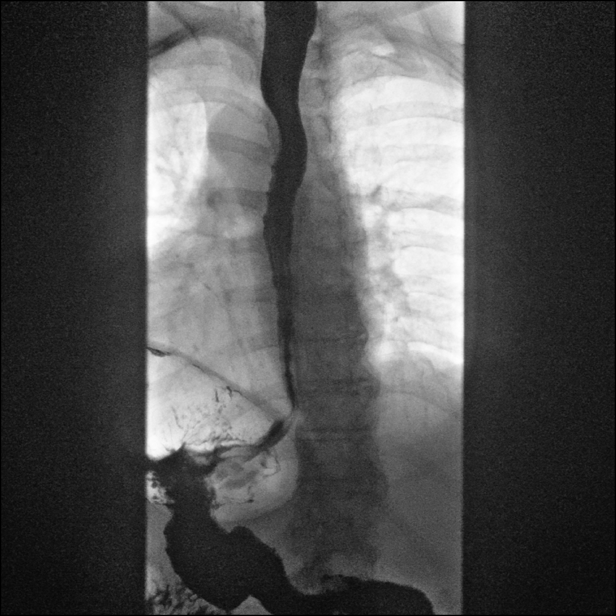

[Series 10: one shot · 1 of 3 slices shown (3 of 3)]
[im 3/3]
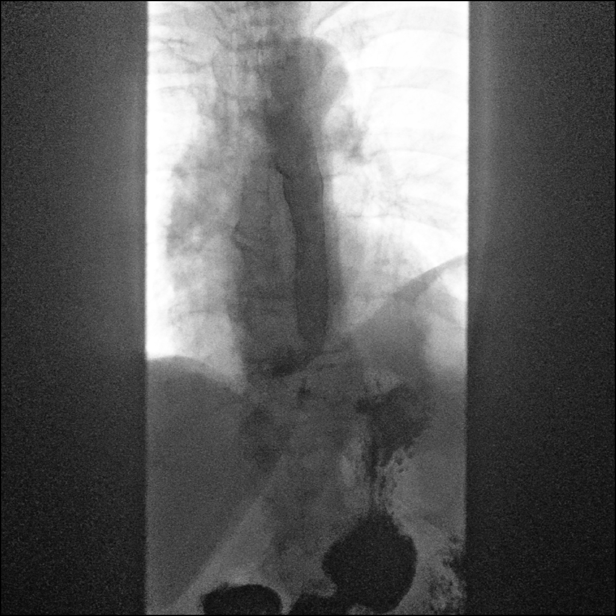

[14 of 24 positions shown; findings below may reference images not displayed]

FINDINGS: The study is mildly limited by the patient's limited mobility.
Examination was performed in the erect and prone RAO positions.

Rapid sequence imaging of the pharynx in the AP and lateral
projections demonstrates flash penetration without aspiration. No
focal mucosal abnormalities are identified.

There is mild esophageal dysmotility with a mildly decreased primary
stripping wave and increased tertiary contractions. No evidence of
esophageal mass, stricture or ulceration. No significant hiatal
hernia. A 13 mm barium tablet was administered and passed without
delay into the stomach.
IMPRESSION: 1. Flash penetration without aspiration.
2. Mild esophageal dysmotility.
3. No evidence of esophageal mass or stricture.

## 2020-02-24 DIAGNOSIS — S42201D Unspecified fracture of upper end of right humerus, subsequent encounter for fracture with routine healing: Secondary | ICD-10-CM | POA: Diagnosis not present

## 2020-02-24 DIAGNOSIS — M25511 Pain in right shoulder: Secondary | ICD-10-CM | POA: Diagnosis not present

## 2020-02-24 DIAGNOSIS — M6281 Muscle weakness (generalized): Secondary | ICD-10-CM | POA: Diagnosis not present

## 2020-02-28 DIAGNOSIS — M6281 Muscle weakness (generalized): Secondary | ICD-10-CM | POA: Diagnosis not present

## 2020-02-28 DIAGNOSIS — S42201D Unspecified fracture of upper end of right humerus, subsequent encounter for fracture with routine healing: Secondary | ICD-10-CM | POA: Diagnosis not present

## 2020-02-28 DIAGNOSIS — M25511 Pain in right shoulder: Secondary | ICD-10-CM | POA: Diagnosis not present

## 2020-03-01 DIAGNOSIS — F3181 Bipolar II disorder: Secondary | ICD-10-CM | POA: Diagnosis not present

## 2020-03-02 DIAGNOSIS — M25511 Pain in right shoulder: Secondary | ICD-10-CM | POA: Diagnosis not present

## 2020-03-02 DIAGNOSIS — S42201D Unspecified fracture of upper end of right humerus, subsequent encounter for fracture with routine healing: Secondary | ICD-10-CM | POA: Diagnosis not present

## 2020-03-02 DIAGNOSIS — M25512 Pain in left shoulder: Secondary | ICD-10-CM | POA: Diagnosis not present

## 2020-03-02 DIAGNOSIS — M6281 Muscle weakness (generalized): Secondary | ICD-10-CM | POA: Diagnosis not present

## 2020-03-04 DIAGNOSIS — G2570 Drug induced movement disorder, unspecified: Secondary | ICD-10-CM | POA: Diagnosis not present

## 2020-03-04 DIAGNOSIS — F332 Major depressive disorder, recurrent severe without psychotic features: Secondary | ICD-10-CM | POA: Diagnosis not present

## 2020-03-04 DIAGNOSIS — F4312 Post-traumatic stress disorder, chronic: Secondary | ICD-10-CM | POA: Diagnosis not present

## 2020-03-04 DIAGNOSIS — F5101 Primary insomnia: Secondary | ICD-10-CM | POA: Diagnosis not present

## 2020-03-06 DIAGNOSIS — M25511 Pain in right shoulder: Secondary | ICD-10-CM | POA: Diagnosis not present

## 2020-03-06 DIAGNOSIS — M6281 Muscle weakness (generalized): Secondary | ICD-10-CM | POA: Diagnosis not present

## 2020-03-06 DIAGNOSIS — S42201D Unspecified fracture of upper end of right humerus, subsequent encounter for fracture with routine healing: Secondary | ICD-10-CM | POA: Diagnosis not present

## 2020-03-09 DIAGNOSIS — M25511 Pain in right shoulder: Secondary | ICD-10-CM | POA: Diagnosis not present

## 2020-03-09 DIAGNOSIS — S42201D Unspecified fracture of upper end of right humerus, subsequent encounter for fracture with routine healing: Secondary | ICD-10-CM | POA: Diagnosis not present

## 2020-03-09 DIAGNOSIS — M6281 Muscle weakness (generalized): Secondary | ICD-10-CM | POA: Diagnosis not present

## 2020-03-09 DIAGNOSIS — G603 Idiopathic progressive neuropathy: Secondary | ICD-10-CM | POA: Diagnosis not present

## 2020-03-09 DIAGNOSIS — D518 Other vitamin B12 deficiency anemias: Secondary | ICD-10-CM | POA: Diagnosis not present

## 2020-04-01 DIAGNOSIS — F332 Major depressive disorder, recurrent severe without psychotic features: Secondary | ICD-10-CM | POA: Diagnosis not present

## 2020-04-01 DIAGNOSIS — F5101 Primary insomnia: Secondary | ICD-10-CM | POA: Diagnosis not present

## 2020-04-01 DIAGNOSIS — F4312 Post-traumatic stress disorder, chronic: Secondary | ICD-10-CM | POA: Diagnosis not present

## 2020-04-01 DIAGNOSIS — G2570 Drug induced movement disorder, unspecified: Secondary | ICD-10-CM | POA: Diagnosis not present

## 2020-04-03 ENCOUNTER — Other Ambulatory Visit: Payer: Self-pay | Admitting: Gastroenterology

## 2020-04-03 DIAGNOSIS — R1310 Dysphagia, unspecified: Secondary | ICD-10-CM | POA: Diagnosis not present

## 2020-04-05 DIAGNOSIS — F3181 Bipolar II disorder: Secondary | ICD-10-CM | POA: Diagnosis not present

## 2020-04-19 DIAGNOSIS — E113293 Type 2 diabetes mellitus with mild nonproliferative diabetic retinopathy without macular edema, bilateral: Secondary | ICD-10-CM | POA: Diagnosis not present

## 2020-04-19 DIAGNOSIS — Z7984 Long term (current) use of oral hypoglycemic drugs: Secondary | ICD-10-CM | POA: Diagnosis not present

## 2020-04-19 DIAGNOSIS — E1142 Type 2 diabetes mellitus with diabetic polyneuropathy: Secondary | ICD-10-CM | POA: Diagnosis not present

## 2020-04-19 DIAGNOSIS — I129 Hypertensive chronic kidney disease with stage 1 through stage 4 chronic kidney disease, or unspecified chronic kidney disease: Secondary | ICD-10-CM | POA: Diagnosis not present

## 2020-04-19 DIAGNOSIS — E1121 Type 2 diabetes mellitus with diabetic nephropathy: Secondary | ICD-10-CM | POA: Diagnosis not present

## 2020-04-19 DIAGNOSIS — R1110 Vomiting, unspecified: Secondary | ICD-10-CM | POA: Diagnosis not present

## 2020-04-19 DIAGNOSIS — N1832 Chronic kidney disease, stage 3b: Secondary | ICD-10-CM | POA: Diagnosis not present

## 2020-04-20 ENCOUNTER — Other Ambulatory Visit (HOSPITAL_COMMUNITY)
Admission: RE | Admit: 2020-04-20 | Discharge: 2020-04-20 | Disposition: A | Discharge status: Home / Self Care | Payer: Medicare Other | Source: Ambulatory Visit | Attending: Gastroenterology | Admitting: Gastroenterology

## 2020-04-20 DIAGNOSIS — Z01812 Encounter for preprocedural laboratory examination: Secondary | ICD-10-CM | POA: Diagnosis not present

## 2020-04-20 DIAGNOSIS — Z20822 Contact with and (suspected) exposure to covid-19: Secondary | ICD-10-CM | POA: Insufficient documentation

## 2020-04-20 LAB — SARS CORONAVIRUS 2 (TAT 6-24 HRS): SARS Coronavirus 2: NEGATIVE

## 2020-04-21 NOTE — Progress Notes (Signed)
Pre-call done for endo procedure Monday 04/24/20. Reviewed information with patient's husband, due to patient sleeping. He is aware she must stay quarantined over the weekend. He confirmed she will be here at 1145 and that he will driver her home post procedure. All questions addressed.

## 2020-04-24 ENCOUNTER — Other Ambulatory Visit: Payer: Self-pay

## 2020-04-24 ENCOUNTER — Encounter (HOSPITAL_COMMUNITY): Payer: Self-pay | Admitting: Gastroenterology

## 2020-04-24 ENCOUNTER — Ambulatory Visit (HOSPITAL_COMMUNITY): Payer: Medicare Other | Admitting: Registered Nurse

## 2020-04-24 ENCOUNTER — Ambulatory Visit (HOSPITAL_COMMUNITY)
Admission: RE | Admit: 2020-04-24 | Discharge: 2020-04-24 | Disposition: A | Discharge status: Home / Self Care | Payer: Medicare Other | Attending: Gastroenterology | Admitting: Gastroenterology

## 2020-04-24 ENCOUNTER — Encounter (HOSPITAL_COMMUNITY)
Admission: RE | Disposition: A | Discharge status: Home / Self Care | Payer: Self-pay | Source: Home / Self Care | Attending: Gastroenterology

## 2020-04-24 DIAGNOSIS — N1832 Chronic kidney disease, stage 3b: Secondary | ICD-10-CM | POA: Insufficient documentation

## 2020-04-24 DIAGNOSIS — E1140 Type 2 diabetes mellitus with diabetic neuropathy, unspecified: Secondary | ICD-10-CM | POA: Diagnosis not present

## 2020-04-24 DIAGNOSIS — R1314 Dysphagia, pharyngoesophageal phase: Secondary | ICD-10-CM | POA: Insufficient documentation

## 2020-04-24 DIAGNOSIS — K2270 Barrett's esophagus without dysplasia: Secondary | ICD-10-CM | POA: Diagnosis not present

## 2020-04-24 DIAGNOSIS — Z7984 Long term (current) use of oral hypoglycemic drugs: Secondary | ICD-10-CM | POA: Insufficient documentation

## 2020-04-24 DIAGNOSIS — E11319 Type 2 diabetes mellitus with unspecified diabetic retinopathy without macular edema: Secondary | ICD-10-CM | POA: Diagnosis not present

## 2020-04-24 DIAGNOSIS — D509 Iron deficiency anemia, unspecified: Secondary | ICD-10-CM | POA: Diagnosis not present

## 2020-04-24 DIAGNOSIS — Z87891 Personal history of nicotine dependence: Secondary | ICD-10-CM | POA: Diagnosis not present

## 2020-04-24 DIAGNOSIS — Z79899 Other long term (current) drug therapy: Secondary | ICD-10-CM | POA: Insufficient documentation

## 2020-04-24 DIAGNOSIS — E785 Hyperlipidemia, unspecified: Secondary | ICD-10-CM | POA: Diagnosis not present

## 2020-04-24 DIAGNOSIS — Z6833 Body mass index (BMI) 33.0-33.9, adult: Secondary | ICD-10-CM | POA: Insufficient documentation

## 2020-04-24 DIAGNOSIS — K222 Esophageal obstruction: Secondary | ICD-10-CM | POA: Diagnosis not present

## 2020-04-24 DIAGNOSIS — F319 Bipolar disorder, unspecified: Secondary | ICD-10-CM | POA: Diagnosis not present

## 2020-04-24 DIAGNOSIS — I129 Hypertensive chronic kidney disease with stage 1 through stage 4 chronic kidney disease, or unspecified chronic kidney disease: Secondary | ICD-10-CM | POA: Insufficient documentation

## 2020-04-24 DIAGNOSIS — Z888 Allergy status to other drugs, medicaments and biological substances status: Secondary | ICD-10-CM | POA: Insufficient documentation

## 2020-04-24 DIAGNOSIS — E1122 Type 2 diabetes mellitus with diabetic chronic kidney disease: Secondary | ICD-10-CM | POA: Diagnosis not present

## 2020-04-24 DIAGNOSIS — K228 Other specified diseases of esophagus: Secondary | ICD-10-CM | POA: Insufficient documentation

## 2020-04-24 DIAGNOSIS — R634 Abnormal weight loss: Secondary | ICD-10-CM | POA: Insufficient documentation

## 2020-04-24 DIAGNOSIS — I10 Essential (primary) hypertension: Secondary | ICD-10-CM | POA: Diagnosis not present

## 2020-04-24 DIAGNOSIS — R1310 Dysphagia, unspecified: Secondary | ICD-10-CM | POA: Diagnosis not present

## 2020-04-24 HISTORY — PX: BALLOON DILATION: SHX5330

## 2020-04-24 HISTORY — PX: BOTOX INJECTION: SHX5754

## 2020-04-24 HISTORY — PX: ESOPHAGOGASTRODUODENOSCOPY (EGD) WITH PROPOFOL: SHX5813

## 2020-04-24 HISTORY — PX: BIOPSY: SHX5522

## 2020-04-24 LAB — GLUCOSE, CAPILLARY
Glucose-Capillary: 131 mg/dL — ABNORMAL HIGH (ref 70–99)
Glucose-Capillary: 69 mg/dL — ABNORMAL LOW (ref 70–99)

## 2020-04-24 SURGERY — ESOPHAGOGASTRODUODENOSCOPY (EGD) WITH PROPOFOL
Anesthesia: Monitor Anesthesia Care

## 2020-04-24 MED ORDER — PROPOFOL 500 MG/50ML IV EMUL
INTRAVENOUS | Status: AC
  Filled 2020-04-24: qty 50

## 2020-04-24 MED ORDER — LACTATED RINGERS IV SOLN
INTRAVENOUS | Status: DC
  Administered 2020-04-24: 1000 mL via INTRAVENOUS

## 2020-04-24 MED ORDER — SODIUM CHLORIDE (PF) 0.9 % IJ SOLN
INTRAMUSCULAR | Status: DC | PRN
  Administered 2020-04-24: 4 mL via SUBMUCOSAL

## 2020-04-24 MED ORDER — PROPOFOL 10 MG/ML IV BOLUS
INTRAVENOUS | Status: DC | PRN
  Administered 2020-04-24 (×3): 20 mg via INTRAVENOUS
  Administered 2020-04-24: 40 mg via INTRAVENOUS
  Administered 2020-04-24 (×2): 20 mg via INTRAVENOUS

## 2020-04-24 MED ORDER — LIDOCAINE 2% (20 MG/ML) 5 ML SYRINGE
INTRAMUSCULAR | Status: DC | PRN
  Administered 2020-04-24: 60 mg via INTRAVENOUS

## 2020-04-24 MED ORDER — ONABOTULINUMTOXINA 100 UNITS IJ SOLR
INTRAMUSCULAR | Status: AC
  Filled 2020-04-24: qty 100

## 2020-04-24 MED ORDER — DEXTROSE 50 % IV SOLN
25.0000 mL | Freq: Once | INTRAVENOUS | Status: AC
  Administered 2020-04-24: 25 mL via INTRAVENOUS

## 2020-04-24 MED ORDER — SODIUM CHLORIDE 0.9 % IV SOLN: INTRAVENOUS | Status: DC

## 2020-04-24 MED ORDER — SODIUM CHLORIDE (PF) 0.9 % IJ SOLN
INTRAMUSCULAR | Status: AC
  Filled 2020-04-24: qty 10

## 2020-04-24 MED ORDER — DEXTROSE 50 % IV SOLN
INTRAVENOUS | Status: AC
  Filled 2020-04-24: qty 50

## 2020-04-24 SURGICAL SUPPLY — 15 items

## 2020-04-24 NOTE — Anesthesia Procedure Notes (Signed)
Date/Time: 04/24/2020 1:25 PM Performed by: Talbot Grumbling, CRNA Oxygen Delivery Method: Simple face mask

## 2020-04-24 NOTE — Brief Op Note (Signed)
04/24/2020  1:46 PM  PATIENT:  [name] [name]  74 y.o. female  PRE-OPERATIVE DIAGNOSIS:  Esophageal dysphagia  POST-OPERATIVE DIAGNOSIS:  * No post-op diagnosis entered *  PROCEDURE:  Procedure(s): ESOPHAGOGASTRODUODENOSCOPY (EGD) WITH PROPOFOL (N/A) BALLOON DILATION (N/A) POSSIBLE BOTOX INJECTION (N/A)  SURGEON:  Surgeon(s) and Role:    Ronnette Juniper, MD - Primary  PHYSICIAN ASSISTANT:   ASSISTANTS: Gerlene Fee, Tech  ANESTHESIA:   MAC  EBL:  0 mL   BLOOD ADMINISTERED:none  DRAINS: none   LOCAL MEDICATIONS USED:  NONE  SPECIMEN:  Biopsy / Limited Resection  DISPOSITION OF SPECIMEN:  PATHOLOGY  COUNTS:  YES  TOURNIQUET:  * No tourniquets in log *  DICTATION: .Dragon Dictation  PLAN OF CARE: Discharge to home after PACU  PATIENT DISPOSITION:  PACU - hemodynamically stable.   Delay start of Pharmacological VTE agent (>24hrs) due to surgical blood loss or risk of bleeding: not applicable

## 2020-04-24 NOTE — Op Note (Signed)
[facility] Patient Name: [name] [name] Procedure Date: 04/24/2020 MRN: [id number] Attending MD: Ronnette Juniper , MD Date of Birth: 1946-05-21 CSN: [id number] Age: 74 Admit Type: Outpatient Procedure:                Upper GI endoscopy Indications:              Dysphagia, Weight loss Providers:                Ronnette Juniper, MD, Cleda Daub, RN, William Dalton,                            Technician Referring MD:             Laban Emperor Medicines:                Monitored Anesthesia Care Complications:            No immediate complications. Estimated blood loss:                            Minimal. Estimated Blood Loss:     Estimated blood loss was minimal. Procedure:                Pre-Anesthesia Assessment:                           - Prior to the procedure, a History and Physical                            was performed, and patient medications and                            allergies were reviewed. The patient's tolerance of                            previous anesthesia was also reviewed. The risks                            and benefits of the procedure and the sedation                            options and risks were discussed with the patient.                            All questions were answered, and informed consent                            was obtained. Prior Anticoagulants: The patient has                            taken no previous anticoagulant or antiplatelet                            agents. ASA Grade Assessment: II - A patient with                            mild  systemic disease. After reviewing the risks                            and benefits, the patient was deemed in                            satisfactory condition to undergo the procedure.                           After obtaining informed consent, the endoscope was                            passed under direct vision. Throughout the                            procedure, the patient's blood  pressure, pulse, and                            oxygen saturations were monitored continuously. The                            GIF-H190 ([id number]) Olympus gastroscope was                            introduced through the mouth, and advanced to the                            second part of duodenum. The upper GI endoscopy was                            accomplished without difficulty. The patient                            tolerated the procedure well. Scope In: Scope Out: Findings:      A widely patent Schatzki ring was found at the gastroesophageal       junction. A TTS dilator was passed through the scope. Dilation with an       18-19-20 mm x 8 cm CRE balloon dilator was performed to 20 mm. The       dilation site was examined following endoscope reinsertion and showed       moderate mucosal disruption. Biopsies were obtained from the proximal       and distal esophagus with cold forceps for histology of suspected       eosinophilic esophagitis.      Area was successfully injected with 100 units botulinum toxin, 25       units/ml, 1 ml in four quadrant fashion, 1 cm above Z line at 38 cm from       insertion.      The Z-line was irregular, nodular mucosa was found 39 cm from the       incisors. Biopsies were taken with a cold forceps for histology.      The entire examined stomach was normal.      The cardia and gastric fundus were normal on retroflexion.      The examined duodenum was normal. Impression:               -  Widely patent Schatzki ring. Dilated. Biopsied.                            Injected with botulinum toxin.                           - Z-line irregular, 39 cm from the incisors.                            Biopsied.                           - Normal stomach.                           - Normal examined duodenum. Moderate Sedation:      Patient did not receive moderate sedation for this procedure, but       instead received monitored anesthesia care. Recommendation:            - Patient has a contact number available for                            emergencies. The signs and symptoms of potential                            delayed complications were discussed with the                            patient. Return to normal activities tomorrow.                            Written discharge instructions were provided to the                            patient.                           - Resume regular diet.                           - Continue present medications.                           - Await pathology results. Procedure Code(s):        --- Professional ---                           [id number], Esophagogastroduodenoscopy, flexible,                            transoral; with transendoscopic balloon dilation of                            esophagus (less than 30 mm diameter)                           43239, 59, Esophagogastroduodenoscopy, flexible,  transoral; with biopsy, single or multiple                           43236, 59, Esophagogastroduodenoscopy, flexible,                            transoral; with directed submucosal injection(s),                            any substance Diagnosis Code(s):        --- Professional ---                           K22.2, Esophageal obstruction                           K22.8, Other specified diseases of esophagus                           R13.10, Dysphagia, unspecified                           R63.4, Abnormal weight loss CPT copyright 2019 [employer] Medical [employer]. All rights reserved. The codes documented in this report are preliminary and upon coder review may  be revised to meet current compliance requirements. Ronnette Juniper, MD 04/24/2020 1:45:51 PM This report has been signed electronically. Number of Addenda: 0

## 2020-04-24 NOTE — Interval H&P Note (Signed)
History and Physical Interval Note: 74/female with dysphagia, weight loss for an EGD with possible balloon dilation and/or botox injection.  04/24/2020 12:38 PM  [name] [name]  has presented today for dysphagia, with the diagnosis of Esophageal dysphagia.  The various methods of treatment have been discussed with the patient and family. After consideration of risks, benefits and other options for treatment, the patient has consented to  Procedure(s): ESOPHAGOGASTRODUODENOSCOPY (EGD) WITH PROPOFOL (N/A) BALLOON DILATION (N/A) POSSIBLE BOTOX INJECTION (N/A) as a surgical intervention.  The patient's history has been reviewed, patient examined, no change in status, stable for surgery.  I have reviewed the patient's chart and labs.  Questions were answered to the patient's satisfaction.     Ronnette Juniper

## 2020-04-24 NOTE — Anesthesia Preprocedure Evaluation (Signed)
Anesthesia Evaluation    Airway Mallampati: I       Dental  (+) Teeth Intact   Pulmonary former smoker,    breath sounds clear to auscultation       Cardiovascular hypertension, Pt. on medications  Rhythm:Regular Rate:Normal     Neuro/Psych PSYCHIATRIC DISORDERS Depression Bipolar Disorder    GI/Hepatic   Endo/Other  diabetes, Oral Hypoglycemic Agents  Renal/GU      Musculoskeletal   Abdominal (+) + obese,   Peds  Hematology   Anesthesia Other Findings   Reproductive/Obstetrics                             Anesthesia Physical Anesthesia Plan  ASA: II  Anesthesia Plan: MAC   Post-op Pain Management:    Induction:   PONV Risk Score and Plan:   Airway Management Planned: Natural Airway and Mask  Additional Equipment: None  Intra-op Plan:   Post-operative Plan:   Informed Consent: I have reviewed the patients History and Physical, chart, labs and discussed the procedure including the risks, benefits and alternatives for the proposed anesthesia with the patient or authorized representative who has indicated his/her understanding and acceptance.       Plan Discussed with: CRNA  Anesthesia Plan Comments:         Anesthesia Quick Evaluation

## 2020-04-24 NOTE — H&P (Signed)
History of Present Illness  General:  74/female was referred for dysphagia. Recent barium swallow showed mild esophageal dysmotility but no obvious mass/stricture and 13 mm barium tab passed in to the stomach. She was previously seen by Dr.Gessner in 2012 and had an EGD and colonoscopy which was unremarkable including pathology. Capsule endoscopy from 2012 was also reported as unremarkable. Labs from 2/21 showed a normal HB,MCV and PLT with CMP showing impaired renal function. She c/o occasional nausea, denies heartburn, but when she eats, even tiny bites, like chewed macaroni it comes up. This concerns her about her kidneys and she feels she cannot keep anything down. SHe feels that she is living on applesauce, soup with "tiny vegetable pieces", macaroni and cheese. Food and liquids can up immediately to even a few hours later. It is undigested and she has a lot of burping. She has lost 20 lbs over a period of 2 months since this started. This is non progressive, but occurs daily with each meal, for both solid and liquids. Denies pain on swallowing. She reports variable BMs- can be constipated, usually followed by diarrhea. She rarely has a "normal " BM, she has noted "black stools"- small but denies blood in stool. She has occasional bloating but doesn't think she has early satiety. She also feels that she has lost her appetite.   Current Medications  Taking   Alprazolam 1 MG Tablet 1 tablet by mouth Twice a day, Notes: [facility]   Amlodipine Besylate 5 Tablet 1 tablet Orally Once a day   CBD Oil ORALLY DAILY   Compression Hose 30/40 Wear daily both legs   Ingrezza(Valbenazine Tosylate) 40 MG Capsule 1 capsule Orally Once a day   Lamictal(lamoTRIgine) 100 MG Tablet 1 tablet Orally twice a day   One Touch Ultra II Lancets DX 250.6 Lancets For use when checking blood sugars daily or As directed   One Touch Ultra II test strips DX 250.6 strips Use to check the patient's CBG q AM,  alternating between AM and PM meals, AC daily or as directed   One Touch Ultra Meter . Marland Kitchen Use as Directed . Marland Kitchen   Sertraline HCl 100 MG Tablet 2 tablet Orally in morning   Metformin HCl 1,000 Tablet 1 tablet with meals Orally Once a day   Simvastatin 10 Tablet TAKE ONE TABLET BY MOUTH DAILY   Vitamin B 12   MVI   Not-Taking   MiraLax(Polyethylene Glycol 3350) 17 GM Packet 1 packet mixed with 8 ounces of fluid Orally Once a day as needed   Tramadol HCl 50 MG Tablet 1 tablet as needed Orally every 8 hours   Medication List reviewed and reconciled with the patient    Past Medical History  Hypertension.   Diabetes.   hyperlipidemia goal LDL less than 100.   Bipolar disorder.   Neuropathy diabetic.   Edema.   lymphedema tarda KNEE HIGH 40MMHG COMPRESSION HOSE sees therapist at [facility].   cholelithiasis seen on CAT scan 01/04/09.   2.7 cm simple left renal cyst seen on CAT scan 01/04/09.   Osteoarthritis knees.   01/2011 iron deficiency anemia start iron EGD-gastritis 06/2011 negative capsule study 07/2011.   stage III renal disease 01/2011, worsening GFR 08/2011 reduce losartan and stop hydrochlorothiazi-improved on followup.   Elevated mcv 05/2012 normal b12 and tsh.   Opthal.   psych Dr Merri Ray.   hand surgeon-Dr. Fredna Dow.   sleep apnea 12/2002 CPAP.   Cpap auto adjusted at 7 cm minimum and 15  cm max.   cardiology Dr Doylene Canard (released).   11/2013 negative nuclear stress.   History of iron deficiency anemia colonoscopy 2012, repeat iron level 12/2013 and check stool cards.   hyperplastic colon polyp Dr Carlean Purl.   dermatology-Dr. Elvera Lennox.   Background diabetic retinopathy 08/2016.   ENT-[facility] (PRN).   opth-Dr. Prudencio Burly.   stage IIIB chronic renal disease normal PTH on 06/2018.   tardive dyskinesia on ingresse Dr Merri Ray.   parkinsonian Dr Jannifer Franklin.    Surgical History  total abdominal hysterectomy bilateral salpingo-oophorectomy 1991  ureter reimplantation 1994  left  ring finger trigger finger release-Dr. Fredna Dow 01/2013  right TKR at [facility] 02/2017  left TKR at [facility] 12/18/2017   Family History  Father: deceased 50 yrs, Myocardial infarction, diagnosed with Coronary artery disease, COPD (chronic obstructive pulmonary disease)  Mother: deceased 53 yrs, Lymphedema, arthritis, Diabetes  Brother 1: alive 74 yrs, A + W, Coronary artery disease  Sister 1: deceased 54 yrs, Lymphedema, seizure disorder,  1 son(s) , 1 daughter(s) .   Daughter 53 in good health, No Family History of Colon Cancer, Polyps, or Liver Disease.   Social History  General:  Tobacco use  cigarettes: Former smoker Quit in year 1990 Pack-year Hx: 25 Tobacco history last updated 04/03/2020 Additional Findings: Tobacco Non-User Ex-moderate cigarette smoker (10-19/day) Vaping No no Alcohol.  no Recreational drug use.  COMMUNICATION BARRIERS: visual impairment, wears glasses.  The patient is married. She is a retired Automotive engineer. She stopped smoking has a 25 pack year history rare alcohol.   Allergies  Losartan Potassium: decline gfr  Seroquel: tardive dyskinesia  Abilify: tardive dyskinesia   Hospitalization/Major Diagnostic Procedure  ER Visit Fall shoulder fx 01/2020   Review of Systems  GI PROCEDURE:  no Pacemaker/ AICD, no. no Artificial heart valves. no MI/heart attack. no Abnormal heart rhythm. no Angina. no CVA. Hypertension YES. no Hypotension. no Asthma, COPD. Sleep apnea YES, using CPAP. no Seizure disorders. Artificial joints YES, bil knees. Severe DJD YES, OA. Diabetes YES. no Significant headaches. no Vertigo. Depression/anxiety YES. no Abnormal bleeding. Kidney Disease yes, stage 3. no Liver disease, no. no Blood transfusion.       Vital Signs  Wt 200.5, Wt change -21.1 lb, Ht 63, BMI 35.51, Temp 97.3.   Examination  Gastroenterology:: GENERAL APPEARANCE: Well developed, overweight, pleasant, no acute distress .  SCLERA: anicteric.  CARDIOVASCULAR PMI  LS border. Normal RRR w/o murmers or gallops. No peripheral edema.  RESPIRATORY Breath sounds normal. Respiration even and unlabored.  ABDOMEN No masses palpated. Liver and spleen not palpated, normal. Bowel sounds normal, Abdomen not distended.  EXTREMITIES: No edema.  NEURO: alert, ambulates with a walker, oriented to time, place and person.  PSYCH: mood/affect normal.     Assessments     1. Esophageal dysphagia - R13.10 (Primary)   Treatment  1. Esophageal dysphagia  IMAGING: EGD w/ DILATATION    Powell,Amy 04/06/2020 12:13:07 PM > Pt scheduled for EGD DIL w botox - at [facility] #[id number]   Notes: Although barium swallow did not show an obvious mass or stricture, patient has continued to have dysphagia for 2 months associated with weight loss. Recommend EGD with esophageal biopsies and possible balloon dilatation if needed or Botox injection if needed. The risks and the benefits of the procedure were discussed with the patient in details. She understands and verbalizes consent.

## 2020-04-24 NOTE — Transfer of Care (Signed)
Immediate Anesthesia Transfer of Care Note  Patient: [name] [name]  Procedure(s) Performed: ESOPHAGOGASTRODUODENOSCOPY (EGD) WITH PROPOFOL (N/A ) BALLOON DILATION (N/A ) POSSIBLE BOTOX INJECTION (N/A )  Patient Location: PACU  Anesthesia Type:MAC  Level of Consciousness: sedated  Airway & Oxygen Therapy: Patient Spontanous Breathing and Patient connected to face mask oxygen  Post-op Assessment: Report given to RN and Post -op Vital signs reviewed and stable  Post vital signs: Reviewed and stable  Last Vitals:  Vitals Value Taken Time  BP 136/72 04/24/20 1345  Temp    Pulse 65 04/24/20 1346  Resp 19 04/24/20 1346  SpO2 100 % 04/24/20 1346  Vitals shown include unvalidated device data.  Last Pain:  Vitals:   04/24/20 1238  TempSrc: Oral  PainSc: 0-No pain         Complications: No apparent anesthesia complications

## 2020-04-24 NOTE — Discharge Instructions (Signed)

## 2020-04-24 NOTE — Anesthesia Postprocedure Evaluation (Signed)
Anesthesia Post Note  Patient: [name] [name]  Procedure(s) Performed: ESOPHAGOGASTRODUODENOSCOPY (EGD) WITH PROPOFOL (N/A ) BALLOON DILATION (N/A ) POSSIBLE BOTOX INJECTION (N/A ) BIOPSY     Patient location during evaluation: Endoscopy Anesthesia Type: MAC Level of consciousness: awake Pain management: pain level controlled Vital Signs Assessment: post-procedure vital signs reviewed and stable Respiratory status: spontaneous breathing Cardiovascular status: stable Postop Assessment: no apparent nausea or vomiting Anesthetic complications: no    Last Vitals:  Vitals:   04/24/20 1347 04/24/20 1350  BP: 136/72 137/74  Pulse: 63 61  Resp: 19 17  Temp: 36.6 C   SpO2: 100% 100%    Last Pain:  Vitals:   04/24/20 1350  TempSrc:   PainSc: 0-No pain   Pain Goal:                   Huston Foley

## 2020-04-25 ENCOUNTER — Other Ambulatory Visit (HOSPITAL_COMMUNITY): Payer: Medicare Other

## 2020-04-25 ENCOUNTER — Encounter: Payer: Self-pay | Admitting: *Deleted

## 2020-04-25 LAB — SURGICAL PATHOLOGY

## 2020-05-16 ENCOUNTER — Other Ambulatory Visit: Payer: Self-pay | Admitting: Gastroenterology

## 2020-05-16 DIAGNOSIS — R112 Nausea with vomiting, unspecified: Secondary | ICD-10-CM

## 2020-05-16 DIAGNOSIS — R634 Abnormal weight loss: Secondary | ICD-10-CM

## 2020-05-16 DIAGNOSIS — K2100 Gastro-esophageal reflux disease with esophagitis, without bleeding: Secondary | ICD-10-CM

## 2020-05-24 ENCOUNTER — Ambulatory Visit
Admission: RE | Admit: 2020-05-24 | Discharge: 2020-05-24 | Disposition: A | Discharge status: Home / Self Care | Payer: Medicare Other | Source: Ambulatory Visit | Attending: Gastroenterology | Admitting: Gastroenterology

## 2020-05-24 ENCOUNTER — Other Ambulatory Visit: Payer: Self-pay | Admitting: Gastroenterology

## 2020-05-24 DIAGNOSIS — R112 Nausea with vomiting, unspecified: Secondary | ICD-10-CM

## 2020-05-24 DIAGNOSIS — K224 Dyskinesia of esophagus: Secondary | ICD-10-CM | POA: Diagnosis not present

## 2020-05-24 DIAGNOSIS — K2100 Gastro-esophageal reflux disease with esophagitis, without bleeding: Secondary | ICD-10-CM

## 2020-05-24 DIAGNOSIS — K219 Gastro-esophageal reflux disease without esophagitis: Secondary | ICD-10-CM | POA: Diagnosis not present

## 2020-05-24 DIAGNOSIS — R634 Abnormal weight loss: Secondary | ICD-10-CM

## 2020-05-24 IMAGING — RF DG UGI W SINGLE CM
7 series · 14 of 20 positions shown · non-contrast
Comparison: Barium esophagram [DATE]

CLINICAL DATA: Nausea and vomiting.

EXAM:
UPPER GI SERIES WITH KUB
TECHNIQUE: After obtaining a scout radiograph a routine upper GI series was
performed using thin density barium.
FLUOROSCOPY TIME:  Fluoroscopy Time:  2 minutes and 12 seconds
Radiation Exposure Index (if provided by the fluoroscopic device):
277 mGy
Number of Acquired Spot Images: 1

[Series 1: one shot · 0.14mm/px · 1 of 1 slices shown (1 of 2)]
[im 1/1]
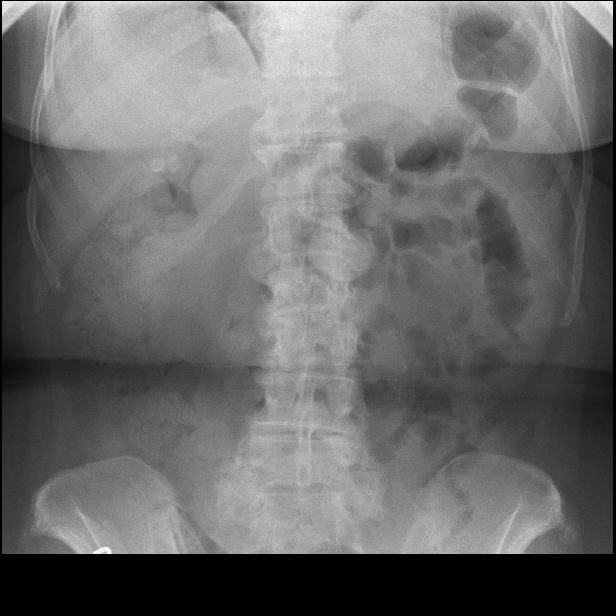

[Series 2: sequence · 2 of 28 frames shown (1 of 5)]
[frame 15/28]
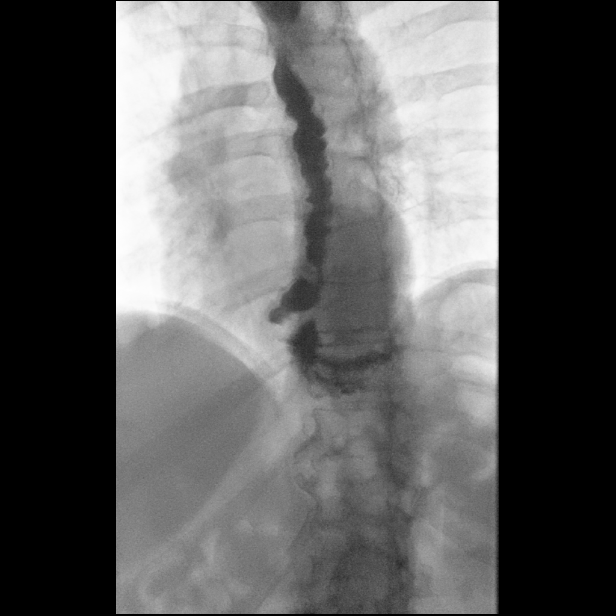
[frame 23/28]
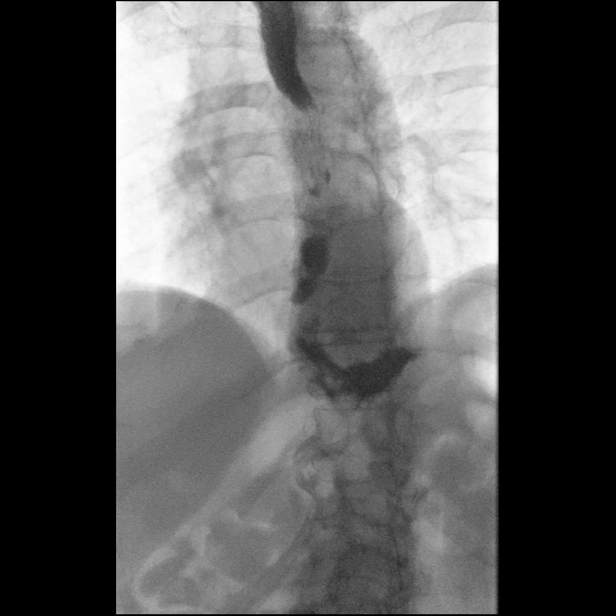

[Series 3: sequence · 3 of 22 frames shown (2 of 5)]
[frame 4/22]
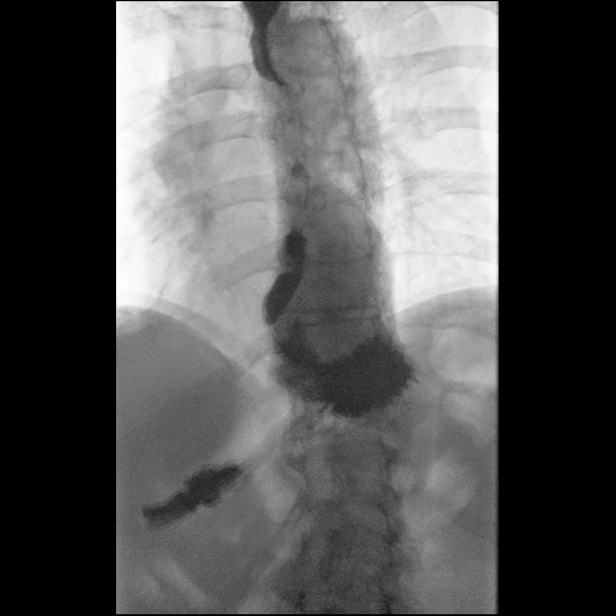
[frame 5/22]
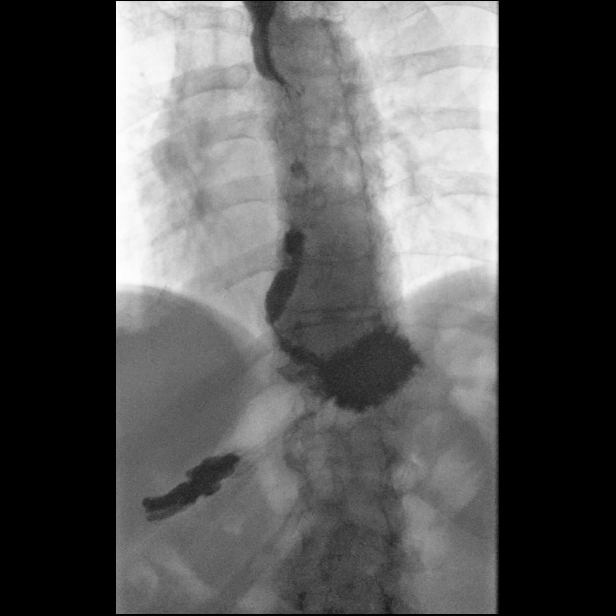
[frame 12/22]
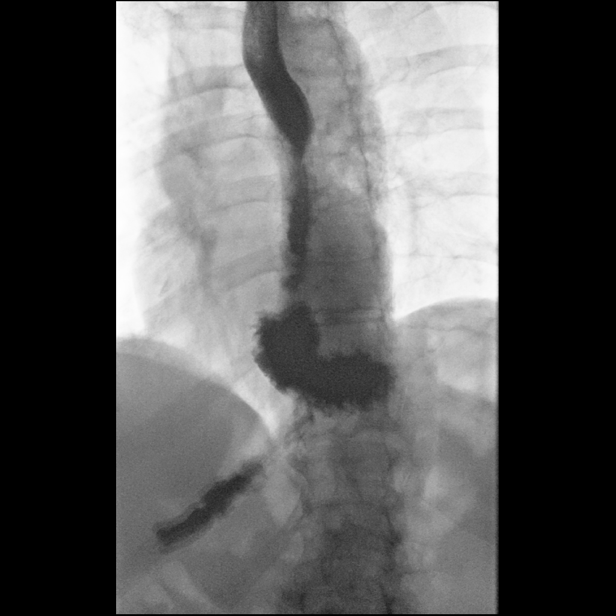

[Series 4: sequence · 3 of 9 frames shown (3 of 5)]
[frame 2/9]
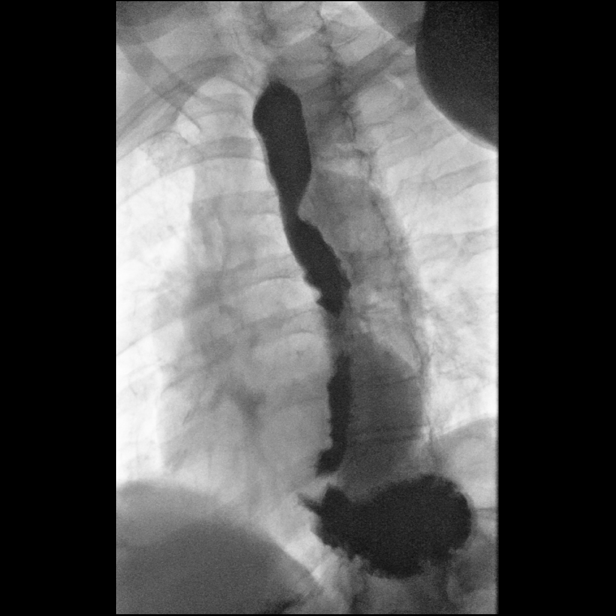
[frame 5/9]
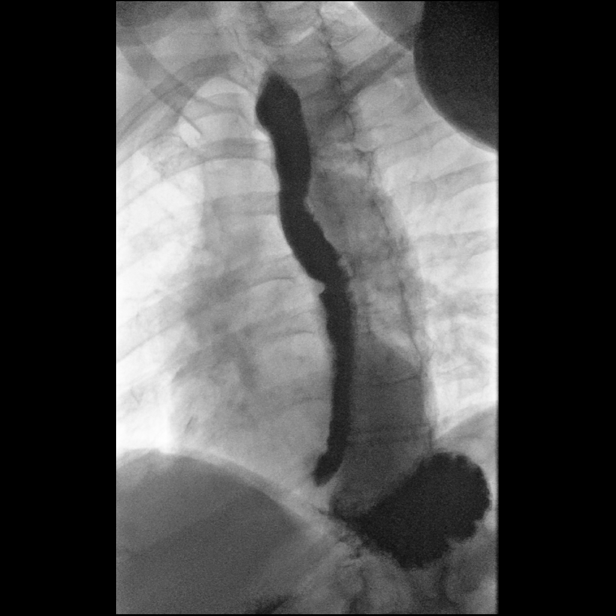
[frame 9/9]
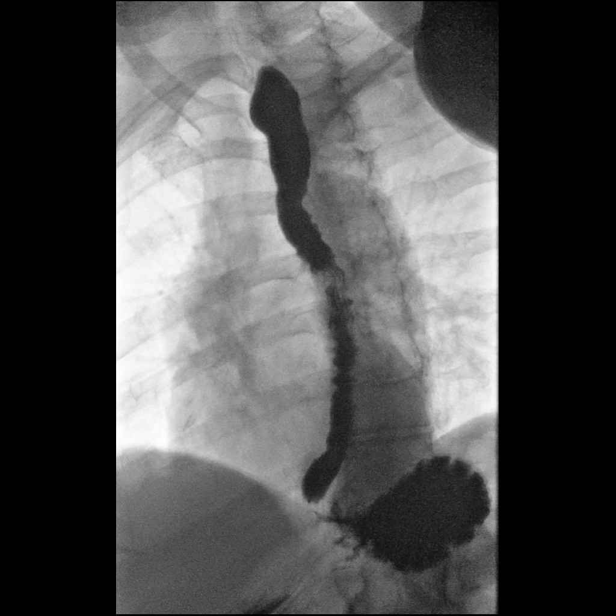

[Series 5: sequence · 2 of 18 frames shown (4 of 5)]
[frame 3/18]
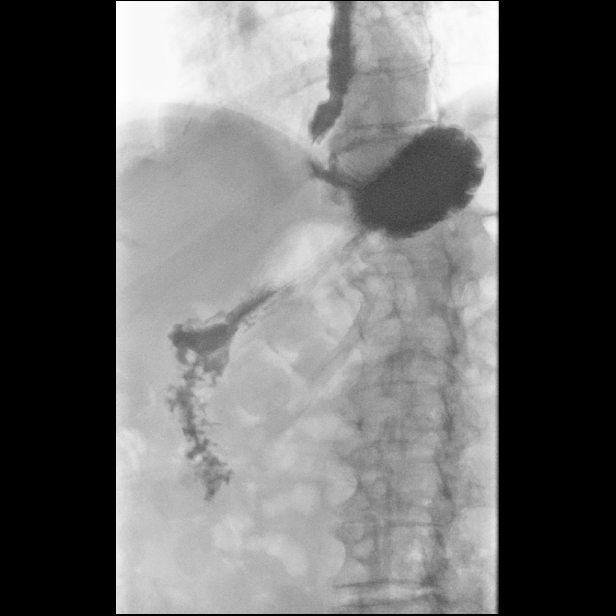
[frame 16/18]
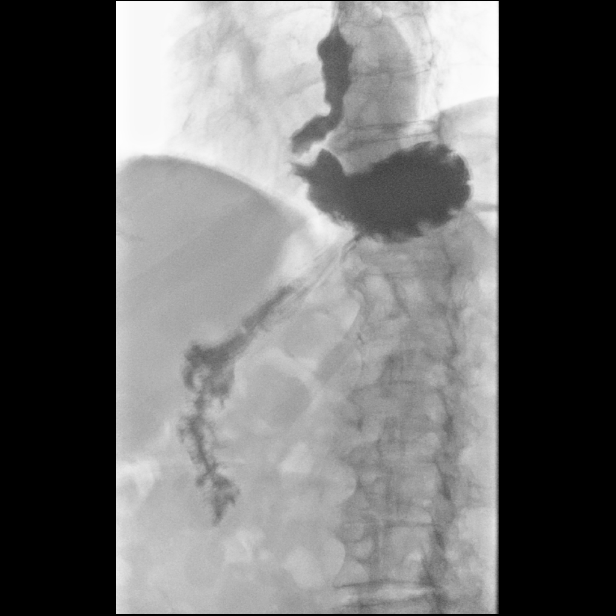

[Series 6: sequence · 2 of 21 frames shown (5 of 5)]
[frame 4/21]
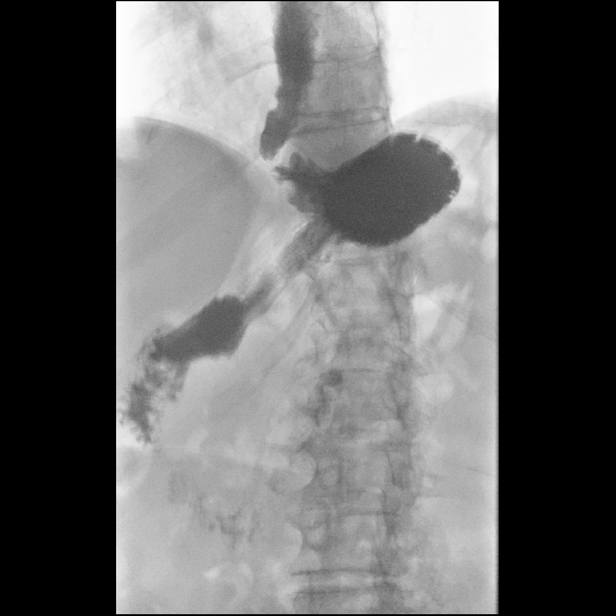
[frame 11/21]
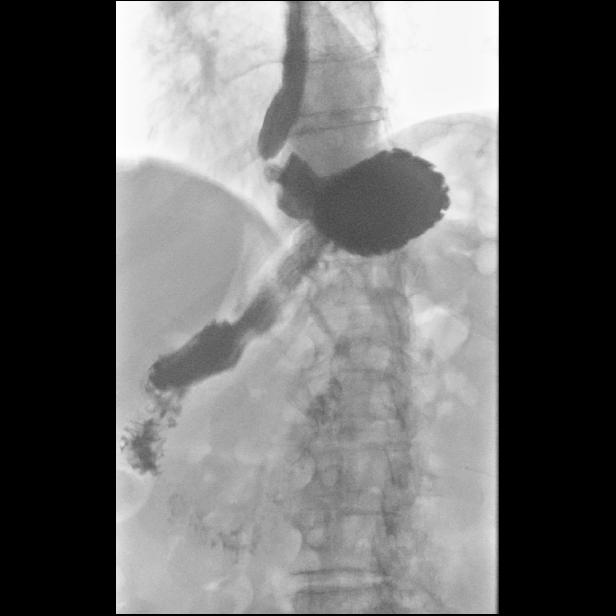

[Series 7: one shot · 0.15mm/px · 1 of 1 slices shown (2 of 2)]
[im 1/1]
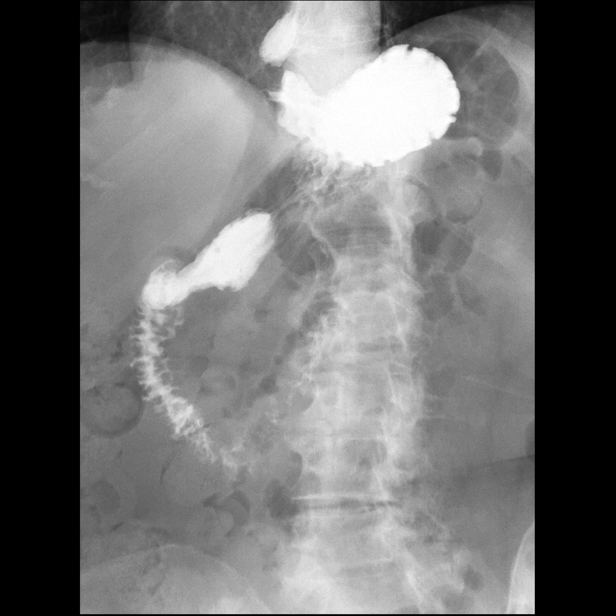

[14 of 20 positions shown; findings below may reference images not displayed]

FINDINGS: Significant esophageal motility disorder with non propagation of the
primary peristaltic wave, frequent tertiary contractions and diffuse
esophageal spasm. Associated significant esophageal stasis.

Moderate-sized hiatal hernia noted with some episodes of GE reflux.

Limited evaluation of the stomach due to inability of the patient to
swallow much barium. Stomach is grossly normal. No mass or ulcer is
identified. The duodenal bulb and C-loop are grossly normal.
IMPRESSION: 1. Significant esophageal motility disorder.
2. Small to moderate-sized hiatal hernia with some have associate GE
reflux.
3. Limited evaluation the stomach but no obvious mass or ulcer.

## 2020-05-27 ENCOUNTER — Other Ambulatory Visit (HOSPITAL_COMMUNITY)
Admission: RE | Admit: 2020-05-27 | Discharge: 2020-05-27 | Disposition: A | Discharge status: Home / Self Care | Payer: Medicare Other | Source: Ambulatory Visit | Attending: Gastroenterology | Admitting: Gastroenterology

## 2020-05-27 DIAGNOSIS — Z01812 Encounter for preprocedural laboratory examination: Secondary | ICD-10-CM | POA: Diagnosis not present

## 2020-05-27 DIAGNOSIS — Z20822 Contact with and (suspected) exposure to covid-19: Secondary | ICD-10-CM | POA: Diagnosis not present

## 2020-05-27 LAB — SARS CORONAVIRUS 2 (TAT 6-24 HRS): SARS Coronavirus 2: NEGATIVE

## 2020-05-31 ENCOUNTER — Encounter (HOSPITAL_COMMUNITY)
Admission: RE | Disposition: A | Discharge status: Home / Self Care | Payer: Self-pay | Source: Home / Self Care | Attending: Gastroenterology

## 2020-05-31 ENCOUNTER — Ambulatory Visit (HOSPITAL_COMMUNITY)
Admission: RE | Admit: 2020-05-31 | Discharge: 2020-05-31 | Disposition: A | Discharge status: Home / Self Care | Payer: Medicare Other | Attending: Gastroenterology | Admitting: Gastroenterology

## 2020-05-31 DIAGNOSIS — R1310 Dysphagia, unspecified: Secondary | ICD-10-CM | POA: Insufficient documentation

## 2020-05-31 HISTORY — PX: ESOPHAGEAL MANOMETRY: SHX5429

## 2020-05-31 SURGERY — MANOMETRY, ESOPHAGUS

## 2020-05-31 MED ORDER — LIDOCAINE VISCOUS HCL 2 % MT SOLN
OROMUCOSAL | Status: AC
  Filled 2020-05-31: qty 15

## 2020-05-31 SURGICAL SUPPLY — 2 items
FACESHIELD LNG OPTICON STERILE (SAFETY) IMPLANT
GLOVE BIO SURGEON STRL SZ8 (GLOVE) ×6 IMPLANT

## 2020-05-31 NOTE — Progress Notes (Signed)
Esophageal manometry performed per protocol without complications.  Patient tolerated well. 

## 2020-06-02 ENCOUNTER — Encounter (HOSPITAL_COMMUNITY): Payer: Self-pay | Admitting: Gastroenterology

## 2020-06-08 DIAGNOSIS — R1310 Dysphagia, unspecified: Secondary | ICD-10-CM | POA: Diagnosis not present

## 2020-06-13 DIAGNOSIS — I129 Hypertensive chronic kidney disease with stage 1 through stage 4 chronic kidney disease, or unspecified chronic kidney disease: Secondary | ICD-10-CM | POA: Diagnosis not present

## 2020-06-13 DIAGNOSIS — E119 Type 2 diabetes mellitus without complications: Secondary | ICD-10-CM | POA: Diagnosis not present

## 2020-06-13 DIAGNOSIS — E1142 Type 2 diabetes mellitus with diabetic polyneuropathy: Secondary | ICD-10-CM | POA: Diagnosis not present

## 2020-06-13 DIAGNOSIS — N1832 Chronic kidney disease, stage 3b: Secondary | ICD-10-CM | POA: Diagnosis not present

## 2020-06-13 DIAGNOSIS — E1121 Type 2 diabetes mellitus with diabetic nephropathy: Secondary | ICD-10-CM | POA: Diagnosis not present

## 2020-06-13 DIAGNOSIS — F319 Bipolar disorder, unspecified: Secondary | ICD-10-CM | POA: Diagnosis not present

## 2020-06-13 DIAGNOSIS — F3132 Bipolar disorder, current episode depressed, moderate: Secondary | ICD-10-CM | POA: Diagnosis not present

## 2020-06-13 DIAGNOSIS — E7800 Pure hypercholesterolemia, unspecified: Secondary | ICD-10-CM | POA: Diagnosis not present

## 2020-06-26 DIAGNOSIS — R1310 Dysphagia, unspecified: Secondary | ICD-10-CM | POA: Diagnosis not present

## 2020-06-27 DIAGNOSIS — F3181 Bipolar II disorder: Secondary | ICD-10-CM | POA: Diagnosis not present

## 2020-07-03 DIAGNOSIS — G2570 Drug induced movement disorder, unspecified: Secondary | ICD-10-CM | POA: Diagnosis not present

## 2020-07-03 DIAGNOSIS — F4312 Post-traumatic stress disorder, chronic: Secondary | ICD-10-CM | POA: Diagnosis not present

## 2020-07-03 DIAGNOSIS — F5101 Primary insomnia: Secondary | ICD-10-CM | POA: Diagnosis not present

## 2020-07-03 DIAGNOSIS — F332 Major depressive disorder, recurrent severe without psychotic features: Secondary | ICD-10-CM | POA: Diagnosis not present

## 2020-07-18 ENCOUNTER — Encounter: Payer: Self-pay | Admitting: Neurology

## 2020-07-18 ENCOUNTER — Ambulatory Visit (INDEPENDENT_AMBULATORY_CARE_PROVIDER_SITE_OTHER): Payer: Medicare Other | Admitting: Neurology

## 2020-07-18 VITALS — BP 116/77 | HR 74 | Ht 63.0 in | Wt 172.0 lb

## 2020-07-18 DIAGNOSIS — E1142 Type 2 diabetes mellitus with diabetic polyneuropathy: Secondary | ICD-10-CM | POA: Diagnosis not present

## 2020-07-18 DIAGNOSIS — G2401 Drug induced subacute dyskinesia: Secondary | ICD-10-CM | POA: Insufficient documentation

## 2020-07-18 DIAGNOSIS — R269 Unspecified abnormalities of gait and mobility: Secondary | ICD-10-CM | POA: Diagnosis not present

## 2020-07-18 HISTORY — DX: Type 2 diabetes mellitus with diabetic polyneuropathy: E11.42

## 2020-07-18 NOTE — Progress Notes (Signed)
Reason for visit: Tardive dyskinesia, peripheral neuropathy  [name] [name] is an 74 y.o. female  History of present illness:  [name] [name] is a 74 year old right-handed white female with a history of bipolar disorder, she has been on antipsychotics in the past and has developed tardive dyskinesia.  She currently is getting Ingrezza through her psychiatry physician.  She has had a lot of problems recently with esophageal issues, she has had a gastroenterological work-up, she has lost almost 45 pounds of weight since last seen.  She is quite fatigued regarding this and feels weak all over.  The patient has a peripheral neuropathy.  She has been told she has diabetes but she claims her hemoglobin A1c runs in the 5.3 range.  She is on Metformin.  She has significant gait instability associated with the neuropathy, she uses a walker for this.  She has not had a fall since January 2021 when she had vertigo.  Past Medical History:  Diagnosis Date  . Allergic rhinitis   . Bipolar disorder ([place])   . Cholelithiasis    seen on CAT 01/04/09  . Chronic renal disease, stage III    3/12  . Complication of anesthesia    versed and fentanyl did not work for colonoscopy  . DM (diabetes mellitus) ([name] [name])   . Edema   . Gait abnormality 08/26/2019  . HLD (hyperlipidemia)   . HTN (hypertension)   . Iron deficiency anemia    3/12   . Lymphedema    tarda  . Neuropathy   . Obstructive sleep apnea on CPAP   . Osteoarthritis   . Renal cyst    2.7 cm seen on CAT 01/04/09  . Tardive dyskinesia     Past Surgical History:  Procedure Laterality Date  . ABDOMINAL HYSTERECTOMY    . BALLOON DILATION N/A 04/24/2020   Procedure: BALLOON DILATION;  Surgeon: Ronnette Juniper, MD;  Location: [facility];  Service: Gastroenterology;  Laterality: N/A;  . BIOPSY  04/24/2020   Procedure: BIOPSY;  Surgeon: Ronnette Juniper, MD;  Location: [facility];  Service: Gastroenterology;;  . Lum Keas INJECTION N/A 04/24/2020   Procedure:  POSSIBLE BOTOX INJECTION;  Surgeon: Ronnette Juniper, MD;  Location: [facility];  Service: Gastroenterology;  Laterality: N/A;  . COLONOSCOPY  2009; 07/09/11   2009:normal (polyp was not adenoma); 2012:   . DILATION AND CURETTAGE OF UTERUS    . ESOPHAGEAL MANOMETRY N/A 05/31/2020   Procedure: ESOPHAGEAL MANOMETRY (EM);  Surgeon: Ronnette Juniper, MD;  Location: [facility];  Service: Gastroenterology;  Laterality: N/A;  . ESOPHAGOGASTRODUODENOSCOPY  07/09/11   Erosive gastritis  . ESOPHAGOGASTRODUODENOSCOPY (EGD) WITH PROPOFOL N/A 04/24/2020   Procedure: ESOPHAGOGASTRODUODENOSCOPY (EGD) WITH PROPOFOL;  Surgeon: Ronnette Juniper, MD;  Location: [facility];  Service: Gastroenterology;  Laterality: N/A;  . FRACTURE SURGERY     rt fx upper arm  . GIVENS CAPSULE STUDY  07/25/2011   normal small bowel  . REIMPLANT URETER IN BLADDER  1996  . TRIGGER FINGER RELEASE Left 02/04/2013   Procedure: RELEASE TRIGGER FINGER/A-1 PULLEY LEFT RING FINGER;  Surgeon: Tennis Must, MD;  Location: [facility];  Service: Orthopedics;  Laterality: Left;    Family History  Problem Relation Age of Onset  . Diabetes Mother   . Hyperlipidemia Mother   . Heart disease Father   . Heart attack Father   . Colon cancer Neg Hx     Social history:  reports that she quit smoking about 29 years ago. Her smoking  use included cigarettes. She has never used smokeless tobacco. She reports previous alcohol use. She reports that she does not use drugs.    Allergies  Allergen Reactions  . Latuda [Lurasidone] Other (See Comments)    Increased suicidal thoughts     Medications:  Prior to Admission medications   Medication Sig Start Date End Date Taking? Authorizing Provider  ALPRAZolam Duanne Moron) 1 MG tablet Take 1 mg by mouth 2 (two) times daily as needed for anxiety.   Yes [provider]  amLODipine (NORVASC) 5 MG tablet Take 5 mg by mouth at bedtime.    Yes [provider]  ferrous sulfate 325 (65 FE) MG  tablet Take 325 mg by mouth at bedtime.    Yes [provider]  lamoTRIgine (LAMICTAL) 200 MG tablet Take 300 mg by mouth daily.   Yes [provider]  metFORMIN (GLUCOPHAGE) 1000 MG tablet Take 1,000 mg by mouth 2 (two) times daily with a meal.    Yes [provider]  Multiple Vitamins-Minerals (MULTIVITAMIN WITH MINERALS) tablet Take 1 tablet by mouth daily.   Yes [provider]  sertraline (ZOLOFT) 100 MG tablet Take 100 mg by mouth 2 (two) times daily.   Yes [provider]  simvastatin (ZOCOR) 40 MG tablet Take 40 mg by mouth at bedtime.    Yes [provider]  Valbenazine Tosylate (INGREZZA) 40 MG CAPS Take 40 mg by mouth at bedtime. 01/05/20  Yes Kathrynn Ducking, MD  vitamin B-12 (CYANOCOBALAMIN) 1000 MCG tablet Take 1,000 mcg by mouth daily. Patient not taking: Reported on 07/18/2020    [provider]    ROS:  Out of a complete 14 system review of symptoms, the patient complains only of the following symptoms, and all other reviewed systems are negative.  Walking difficulty Weight loss Swallowing problems  Blood pressure 116/77, pulse 74, height 5\' 3"  (1.6 m), weight 172 lb (78 kg).  Physical Exam  General: The patient is alert and cooperative at the time of the examination.  Skin: No significant peripheral edema is noted.   Neurologic Exam  Mental status: The patient is alert and oriented x 3 at the time of the examination. The patient has apparent normal recent and remote memory, with an apparently normal attention span and concentration ability.   Cranial nerves: Facial symmetry is present. Speech is normal, no aphasia or dysarthria is noted. Extraocular movements are full. Visual fields are full.  Motor: The patient has good strength in all 4 extremities.  Sensory examination: Soft touch sensation is symmetric on the face, arms, and legs.  Coordination: The patient has good finger-nose-finger and  heel-to-shin bilaterally.  Gait and station: The patient has a wide-based gait.  The patient walks with a walker.  Tandem gait was not attempted.  Romberg is negative but is unsteady.  Reflexes: Deep tendon reflexes are symmetric.   Assessment/Plan:  1.  Tardive dyskinesia  2.  Peripheral neuropathy  3.  Gait disorder  The patient is on Ingrezza, she is no longer getting any medications through this office.  She will follow-up through this office if needed.  She is not having a lot of discomfort from her neuropathy.  Jill Alexanders MD 07/18/2020 12:39 PM  [facility] [address] [place], [place] [address]  Phone [phone] Fax [phone]

## 2020-07-27 DIAGNOSIS — F3181 Bipolar II disorder: Secondary | ICD-10-CM | POA: Diagnosis not present

## 2020-08-06 DIAGNOSIS — Z23 Encounter for immunization: Secondary | ICD-10-CM | POA: Diagnosis not present

## 2020-08-11 DIAGNOSIS — F3181 Bipolar II disorder: Secondary | ICD-10-CM | POA: Diagnosis not present

## 2020-08-17 DIAGNOSIS — E1121 Type 2 diabetes mellitus with diabetic nephropathy: Secondary | ICD-10-CM | POA: Diagnosis not present

## 2020-08-17 DIAGNOSIS — N1832 Chronic kidney disease, stage 3b: Secondary | ICD-10-CM | POA: Diagnosis not present

## 2020-08-17 DIAGNOSIS — E1142 Type 2 diabetes mellitus with diabetic polyneuropathy: Secondary | ICD-10-CM | POA: Diagnosis not present

## 2020-08-17 DIAGNOSIS — M25562 Pain in left knee: Secondary | ICD-10-CM | POA: Diagnosis not present

## 2020-08-17 DIAGNOSIS — E7800 Pure hypercholesterolemia, unspecified: Secondary | ICD-10-CM | POA: Diagnosis not present

## 2020-08-17 DIAGNOSIS — M25561 Pain in right knee: Secondary | ICD-10-CM | POA: Diagnosis not present

## 2020-08-17 DIAGNOSIS — I129 Hypertensive chronic kidney disease with stage 1 through stage 4 chronic kidney disease, or unspecified chronic kidney disease: Secondary | ICD-10-CM | POA: Diagnosis not present

## 2020-08-17 DIAGNOSIS — F3132 Bipolar disorder, current episode depressed, moderate: Secondary | ICD-10-CM | POA: Diagnosis not present

## 2020-08-17 DIAGNOSIS — F319 Bipolar disorder, unspecified: Secondary | ICD-10-CM | POA: Diagnosis not present

## 2020-08-17 DIAGNOSIS — E119 Type 2 diabetes mellitus without complications: Secondary | ICD-10-CM | POA: Diagnosis not present

## 2020-08-25 DIAGNOSIS — E1121 Type 2 diabetes mellitus with diabetic nephropathy: Secondary | ICD-10-CM | POA: Diagnosis not present

## 2020-08-25 DIAGNOSIS — D692 Other nonthrombocytopenic purpura: Secondary | ICD-10-CM | POA: Diagnosis not present

## 2020-08-25 DIAGNOSIS — I129 Hypertensive chronic kidney disease with stage 1 through stage 4 chronic kidney disease, or unspecified chronic kidney disease: Secondary | ICD-10-CM | POA: Diagnosis not present

## 2020-08-25 DIAGNOSIS — M25562 Pain in left knee: Secondary | ICD-10-CM | POA: Diagnosis not present

## 2020-08-25 DIAGNOSIS — E1142 Type 2 diabetes mellitus with diabetic polyneuropathy: Secondary | ICD-10-CM | POA: Diagnosis not present

## 2020-08-25 DIAGNOSIS — L729 Follicular cyst of the skin and subcutaneous tissue, unspecified: Secondary | ICD-10-CM | POA: Diagnosis not present

## 2020-08-25 DIAGNOSIS — N1832 Chronic kidney disease, stage 3b: Secondary | ICD-10-CM | POA: Diagnosis not present

## 2020-08-31 DIAGNOSIS — F3181 Bipolar II disorder: Secondary | ICD-10-CM | POA: Diagnosis not present

## 2020-09-01 DIAGNOSIS — Z961 Presence of intraocular lens: Secondary | ICD-10-CM | POA: Diagnosis not present

## 2020-09-01 DIAGNOSIS — H52203 Unspecified astigmatism, bilateral: Secondary | ICD-10-CM | POA: Diagnosis not present

## 2020-09-01 DIAGNOSIS — E119 Type 2 diabetes mellitus without complications: Secondary | ICD-10-CM | POA: Diagnosis not present

## 2020-09-14 DIAGNOSIS — F3181 Bipolar II disorder: Secondary | ICD-10-CM | POA: Diagnosis not present

## 2020-09-18 DIAGNOSIS — Z1389 Encounter for screening for other disorder: Secondary | ICD-10-CM | POA: Diagnosis not present

## 2020-09-18 DIAGNOSIS — Z0000 Encounter for general adult medical examination without abnormal findings: Secondary | ICD-10-CM | POA: Diagnosis not present

## 2020-09-27 DIAGNOSIS — R197 Diarrhea, unspecified: Secondary | ICD-10-CM | POA: Diagnosis not present

## 2020-09-27 DIAGNOSIS — Z1211 Encounter for screening for malignant neoplasm of colon: Secondary | ICD-10-CM | POA: Diagnosis not present

## 2020-09-27 DIAGNOSIS — R194 Change in bowel habit: Secondary | ICD-10-CM | POA: Diagnosis not present

## 2020-09-27 DIAGNOSIS — R634 Abnormal weight loss: Secondary | ICD-10-CM | POA: Diagnosis not present

## 2020-09-28 DIAGNOSIS — F3181 Bipolar II disorder: Secondary | ICD-10-CM | POA: Diagnosis not present

## 2020-10-23 DIAGNOSIS — F5101 Primary insomnia: Secondary | ICD-10-CM | POA: Diagnosis not present

## 2020-10-23 DIAGNOSIS — G2570 Drug induced movement disorder, unspecified: Secondary | ICD-10-CM | POA: Diagnosis not present

## 2020-10-23 DIAGNOSIS — F4312 Post-traumatic stress disorder, chronic: Secondary | ICD-10-CM | POA: Diagnosis not present

## 2020-10-23 DIAGNOSIS — F332 Major depressive disorder, recurrent severe without psychotic features: Secondary | ICD-10-CM | POA: Diagnosis not present

## 2020-10-26 DIAGNOSIS — F3181 Bipolar II disorder: Secondary | ICD-10-CM | POA: Diagnosis not present

## 2020-10-31 ENCOUNTER — Observation Stay (HOSPITAL_COMMUNITY): Payer: Medicare Other

## 2020-10-31 ENCOUNTER — Observation Stay (HOSPITAL_COMMUNITY)
Admission: EM | Admit: 2020-10-31 | Discharge: 2020-11-02 | Disposition: A | Discharge status: Home / Self Care | Payer: Medicare Other | Attending: Internal Medicine | Admitting: Internal Medicine

## 2020-10-31 ENCOUNTER — Emergency Department (HOSPITAL_COMMUNITY): Payer: Medicare Other

## 2020-10-31 ENCOUNTER — Other Ambulatory Visit: Payer: Self-pay

## 2020-10-31 DIAGNOSIS — R58 Hemorrhage, not elsewhere classified: Secondary | ICD-10-CM | POA: Diagnosis not present

## 2020-10-31 DIAGNOSIS — N183 Chronic kidney disease, stage 3 unspecified: Secondary | ICD-10-CM | POA: Insufficient documentation

## 2020-10-31 DIAGNOSIS — S52122A Displaced fracture of head of left radius, initial encounter for closed fracture: Principal | ICD-10-CM | POA: Insufficient documentation

## 2020-10-31 DIAGNOSIS — I4510 Unspecified right bundle-branch block: Secondary | ICD-10-CM | POA: Diagnosis not present

## 2020-10-31 DIAGNOSIS — R42 Dizziness and giddiness: Secondary | ICD-10-CM | POA: Insufficient documentation

## 2020-10-31 DIAGNOSIS — F319 Bipolar disorder, unspecified: Secondary | ICD-10-CM

## 2020-10-31 DIAGNOSIS — J3489 Other specified disorders of nose and nasal sinuses: Secondary | ICD-10-CM | POA: Diagnosis not present

## 2020-10-31 DIAGNOSIS — G4733 Obstructive sleep apnea (adult) (pediatric): Secondary | ICD-10-CM

## 2020-10-31 DIAGNOSIS — R8281 Pyuria: Secondary | ICD-10-CM

## 2020-10-31 DIAGNOSIS — S0990XA Unspecified injury of head, initial encounter: Secondary | ICD-10-CM | POA: Diagnosis not present

## 2020-10-31 DIAGNOSIS — Z7984 Long term (current) use of oral hypoglycemic drugs: Secondary | ICD-10-CM | POA: Diagnosis not present

## 2020-10-31 DIAGNOSIS — D539 Nutritional anemia, unspecified: Secondary | ICD-10-CM | POA: Diagnosis not present

## 2020-10-31 DIAGNOSIS — S52182A Other fracture of upper end of left radius, initial encounter for closed fracture: Secondary | ICD-10-CM | POA: Diagnosis not present

## 2020-10-31 DIAGNOSIS — H538 Other visual disturbances: Secondary | ICD-10-CM

## 2020-10-31 DIAGNOSIS — N1831 Chronic kidney disease, stage 3a: Secondary | ICD-10-CM

## 2020-10-31 DIAGNOSIS — Z20822 Contact with and (suspected) exposure to covid-19: Secondary | ICD-10-CM | POA: Insufficient documentation

## 2020-10-31 DIAGNOSIS — S4992XA Unspecified injury of left shoulder and upper arm, initial encounter: Secondary | ICD-10-CM | POA: Diagnosis not present

## 2020-10-31 DIAGNOSIS — I129 Hypertensive chronic kidney disease with stage 1 through stage 4 chronic kidney disease, or unspecified chronic kidney disease: Secondary | ICD-10-CM | POA: Insufficient documentation

## 2020-10-31 DIAGNOSIS — R220 Localized swelling, mass and lump, head: Secondary | ICD-10-CM | POA: Diagnosis not present

## 2020-10-31 DIAGNOSIS — Z79899 Other long term (current) drug therapy: Secondary | ICD-10-CM | POA: Diagnosis not present

## 2020-10-31 DIAGNOSIS — G2401 Drug induced subacute dyskinesia: Secondary | ICD-10-CM | POA: Diagnosis not present

## 2020-10-31 DIAGNOSIS — Y92018 Other place in single-family (private) house as the place of occurrence of the external cause: Secondary | ICD-10-CM | POA: Insufficient documentation

## 2020-10-31 DIAGNOSIS — R4020 Unspecified coma: Secondary | ICD-10-CM | POA: Diagnosis not present

## 2020-10-31 DIAGNOSIS — I10 Essential (primary) hypertension: Secondary | ICD-10-CM | POA: Diagnosis not present

## 2020-10-31 DIAGNOSIS — E1122 Type 2 diabetes mellitus with diabetic chronic kidney disease: Secondary | ICD-10-CM | POA: Diagnosis not present

## 2020-10-31 DIAGNOSIS — S52102A Unspecified fracture of upper end of left radius, initial encounter for closed fracture: Secondary | ICD-10-CM

## 2020-10-31 DIAGNOSIS — S0101XA Laceration without foreign body of scalp, initial encounter: Secondary | ICD-10-CM | POA: Diagnosis not present

## 2020-10-31 DIAGNOSIS — E1142 Type 2 diabetes mellitus with diabetic polyneuropathy: Secondary | ICD-10-CM

## 2020-10-31 DIAGNOSIS — I6523 Occlusion and stenosis of bilateral carotid arteries: Secondary | ICD-10-CM | POA: Insufficient documentation

## 2020-10-31 DIAGNOSIS — Y92009 Unspecified place in unspecified non-institutional (private) residence as the place of occurrence of the external cause: Secondary | ICD-10-CM | POA: Diagnosis not present

## 2020-10-31 DIAGNOSIS — W19XXXA Unspecified fall, initial encounter: Secondary | ICD-10-CM | POA: Insufficient documentation

## 2020-10-31 DIAGNOSIS — R9431 Abnormal electrocardiogram [ECG] [EKG]: Secondary | ICD-10-CM

## 2020-10-31 DIAGNOSIS — Z87891 Personal history of nicotine dependence: Secondary | ICD-10-CM | POA: Diagnosis not present

## 2020-10-31 DIAGNOSIS — J322 Chronic ethmoidal sinusitis: Secondary | ICD-10-CM | POA: Diagnosis not present

## 2020-10-31 DIAGNOSIS — S59902A Unspecified injury of left elbow, initial encounter: Secondary | ICD-10-CM | POA: Diagnosis present

## 2020-10-31 DIAGNOSIS — Z9989 Dependence on other enabling machines and devices: Secondary | ICD-10-CM

## 2020-10-31 LAB — CBC WITH DIFFERENTIAL/PLATELET
Abs Immature Granulocytes: 0.02 10*3/uL (ref 0.00–0.07)
Basophils Absolute: 0 10*3/uL (ref 0.0–0.1)
Basophils Relative: 1 %
Eosinophils Absolute: 0.2 10*3/uL (ref 0.0–0.5)
Eosinophils Relative: 3 %
HCT: 37.2 % (ref 36.0–46.0)
Hemoglobin: 11.8 g/dL — ABNORMAL LOW (ref 12.0–15.0)
Immature Granulocytes: 0 %
Lymphocytes Relative: 13 %
Lymphs Abs: 0.7 10*3/uL (ref 0.7–4.0)
MCH: 33.4 pg (ref 26.0–34.0)
MCHC: 31.7 g/dL (ref 30.0–36.0)
MCV: 105.4 fL — ABNORMAL HIGH (ref 80.0–100.0)
Monocytes Absolute: 0.5 10*3/uL (ref 0.1–1.0)
Monocytes Relative: 10 %
Neutro Abs: 4 10*3/uL (ref 1.7–7.7)
Neutrophils Relative %: 73 %
Platelets: 151 10*3/uL (ref 150–400)
RBC: 3.53 MIL/uL — ABNORMAL LOW (ref 3.87–5.11)
RDW: 12.5 % (ref 11.5–15.5)
WBC: 5.4 10*3/uL (ref 4.0–10.5)
nRBC: 0 % (ref 0.0–0.2)

## 2020-10-31 LAB — TSH: TSH: 2.298 u[IU]/mL (ref 0.350–4.500)

## 2020-10-31 LAB — HEMOGLOBIN A1C
Hgb A1c MFr Bld: 4.9 % (ref 4.8–5.6)
Mean Plasma Glucose: 93.93 mg/dL

## 2020-10-31 LAB — BASIC METABOLIC PANEL
Anion gap: 11 (ref 5–15)
BUN: 28 mg/dL — ABNORMAL HIGH (ref 8–23)
CO2: 22 mmol/L (ref 22–32)
Calcium: 9.5 mg/dL (ref 8.9–10.3)
Chloride: 107 mmol/L (ref 98–111)
Creatinine, Ser: 1.02 mg/dL — ABNORMAL HIGH (ref 0.44–1.00)
GFR, Estimated: 58 mL/min — ABNORMAL LOW (ref >60)
Glucose, Bld: 91 mg/dL (ref 70–99)
Potassium: 4 mmol/L (ref 3.5–5.1)
Sodium: 140 mmol/L (ref 135–145)

## 2020-10-31 LAB — URINALYSIS, ROUTINE W REFLEX MICROSCOPIC
Bilirubin Urine: NEGATIVE
Glucose, UA: NEGATIVE mg/dL
Hgb urine dipstick: NEGATIVE
Ketones, ur: NEGATIVE mg/dL
Nitrite: NEGATIVE
Protein, ur: NEGATIVE mg/dL
Specific Gravity, Urine: 1.01 (ref 1.005–1.030)
pH: 6 (ref 5.0–8.0)

## 2020-10-31 LAB — CBG MONITORING, ED: Glucose-Capillary: 93 mg/dL (ref 70–99)

## 2020-10-31 IMAGING — CR DG ELBOW COMPLETE 3+V*L*
4 series · 4 of 4 positions shown · non-contrast
Comparison: None.

CLINICAL DATA: Pain following fall

EXAM:
LEFT ELBOW - COMPLETE 3+ VIEW

[elbow ap]
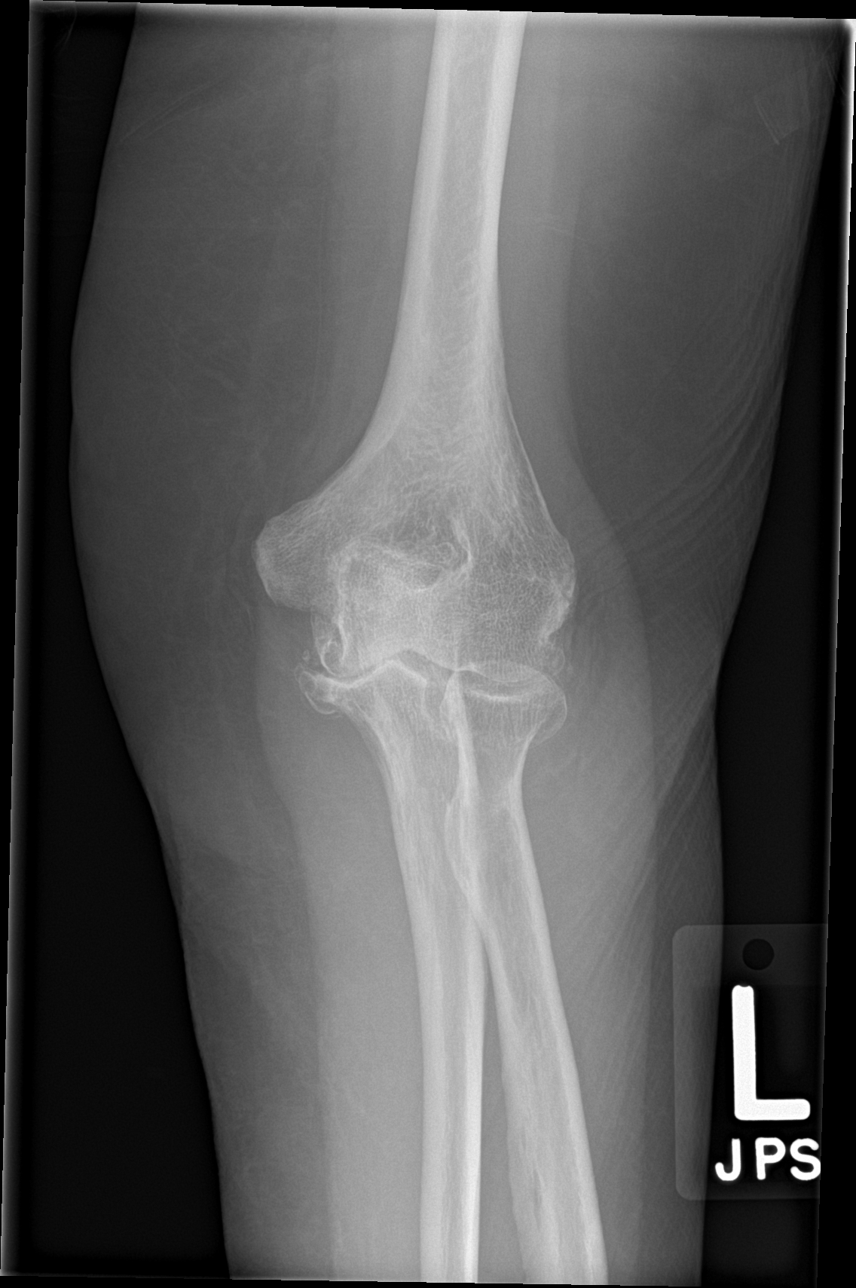

[elbow obl (1 of 2)]
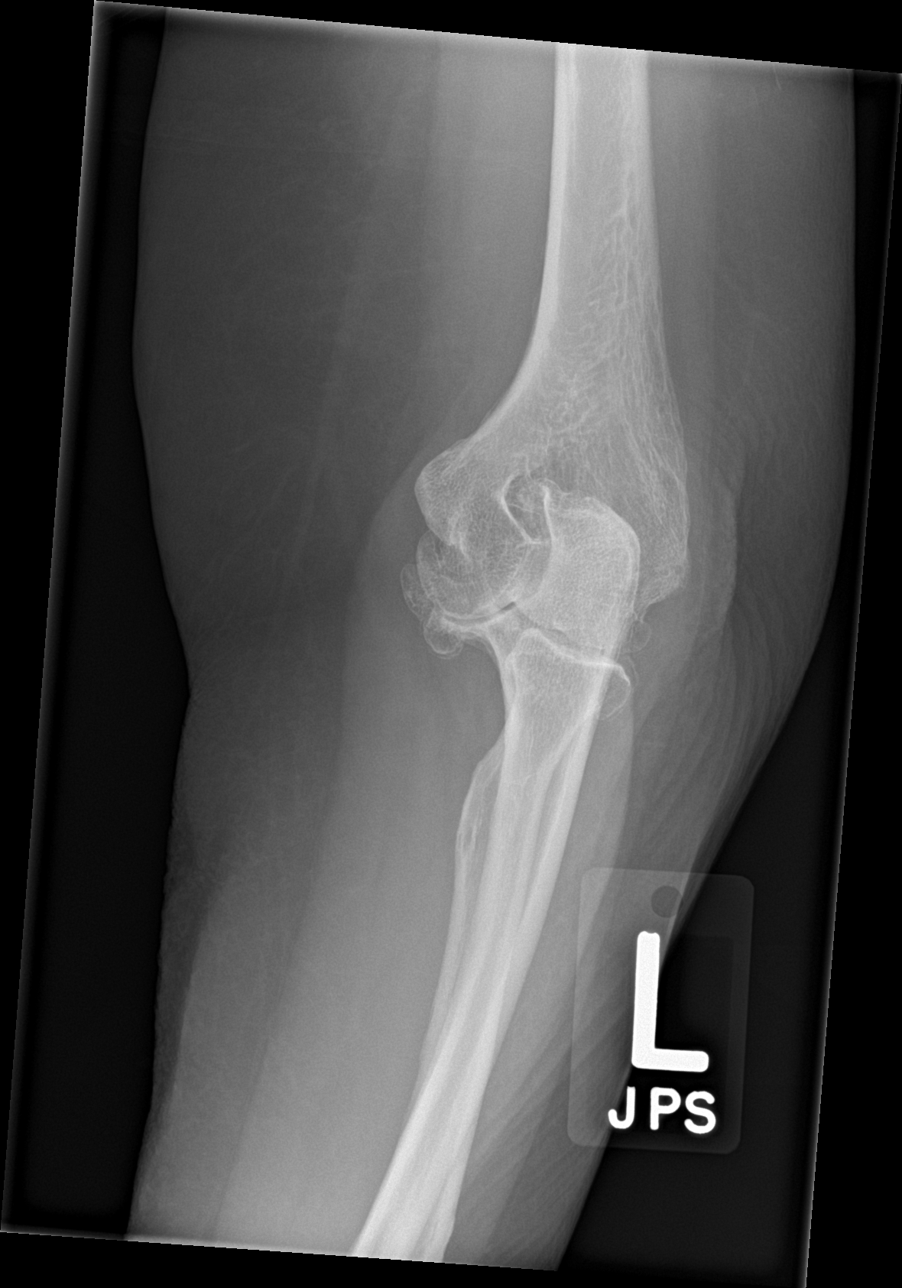

[elbow obl (2 of 2)]
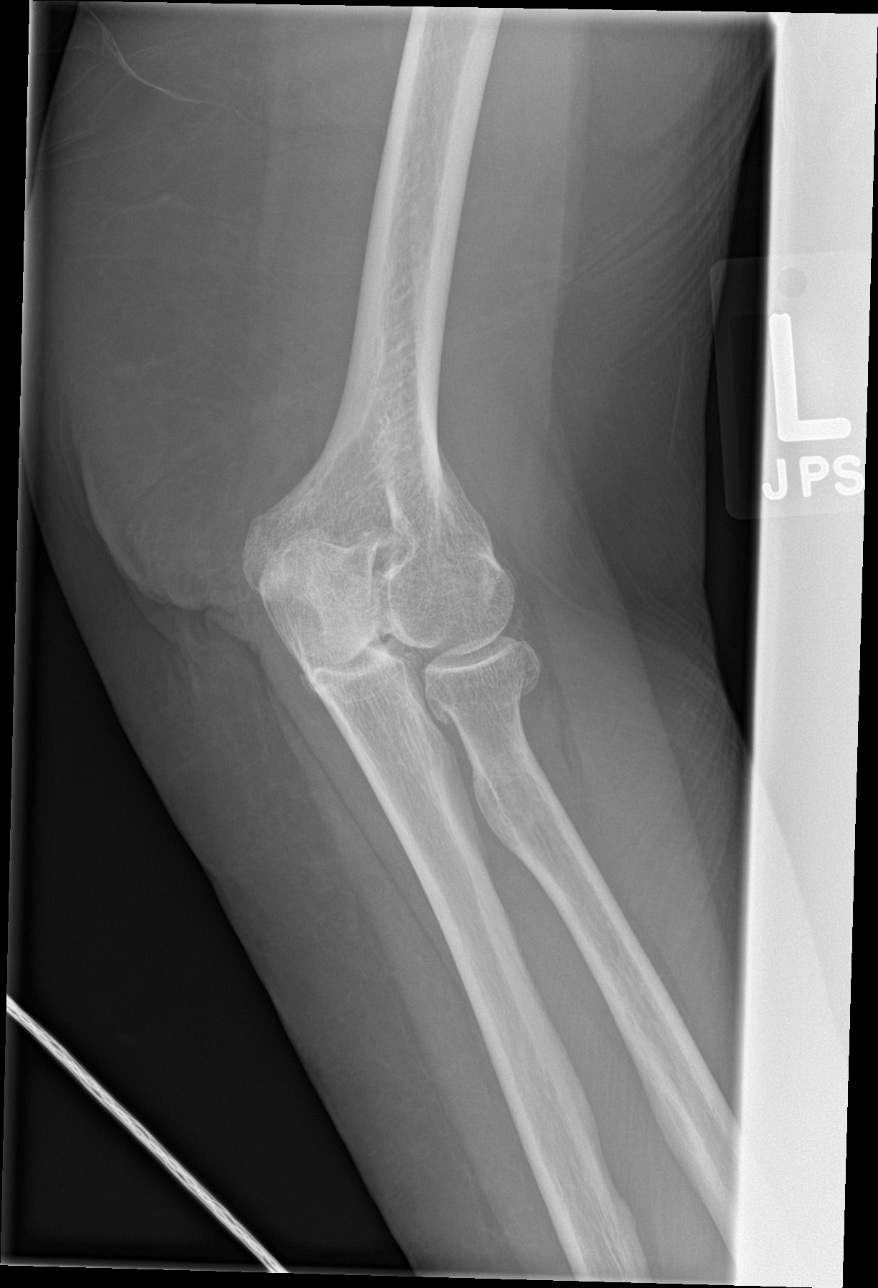

[elbow lat]
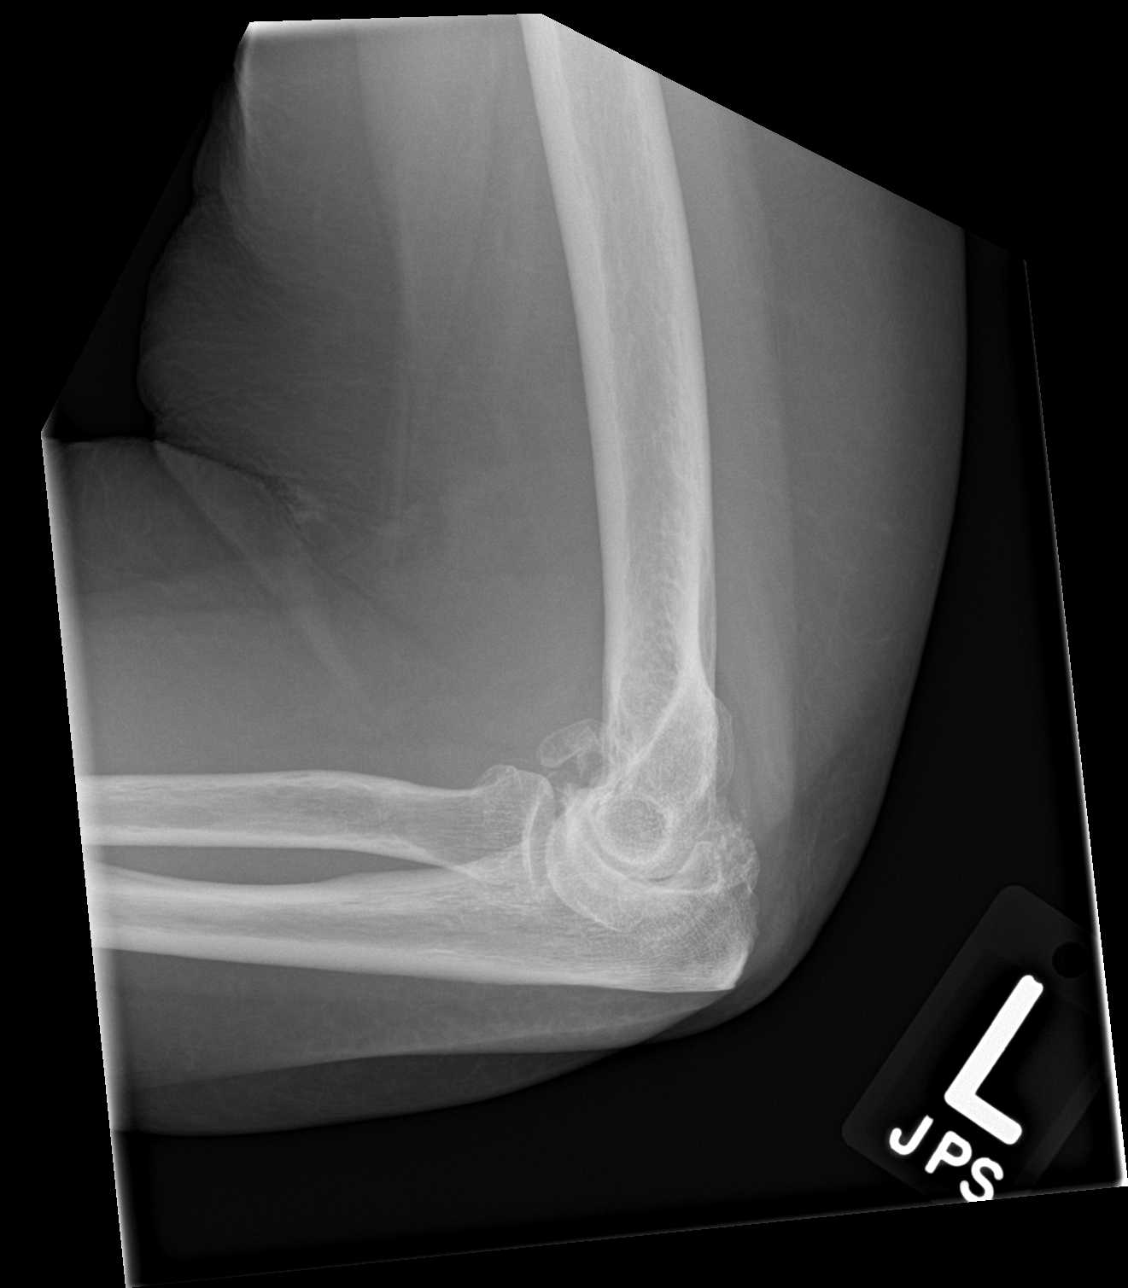

[4 of 4 positions shown; findings below may reference images not displayed]

FINDINGS: Frontal, lateral, and bilateral oblique views were obtained. There
is a fracture of the proximal radial metaphysis with slight
impaction in this area. No other acute fracture. No dislocation.
There is bony overgrowth in the elbow joint region with generalized
joint space narrowing.
IMPRESSION: Fracture proximal radial metaphysis with impaction at fracture site.
No other fracture. No dislocation. Extensive arthropathy and bony
overgrowth within the elbow joint.

## 2020-10-31 IMAGING — MR MR HEAD WO/W CM
6 of 12 series · 19 of 48 positions shown · IV contrast (gadavist)
Comparison: [DATE] and prior.

CLINICAL DATA: dizziness

EXAM:
MRI HEAD WITHOUT AND WITH CONTRAST
TECHNIQUE: Multiplanar, multiecho pulse sequences of the brain and surrounding
structures were obtained without and with intravenous contrast.
CONTRAST:  7.5mL GADAVIST GADOBUTROL 1 MMOL/ML IV SOLN

[Series 4: DWI · coronal · 4.0mm · 0.94mm/px · 7 of 78 slices shown]
[im 1/78]
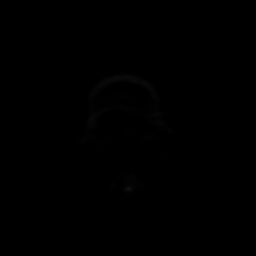
[im 13/78]
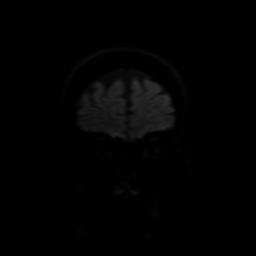
[im 26/78]
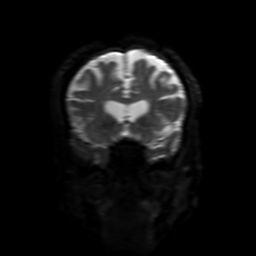
[im 39/78]
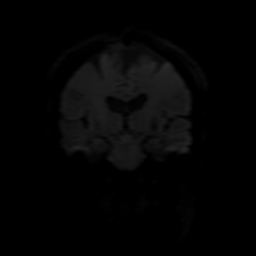
[im 52/78]
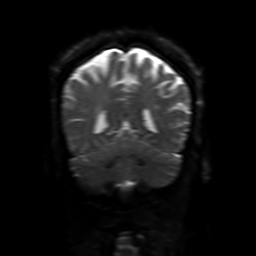
[im 65/78]
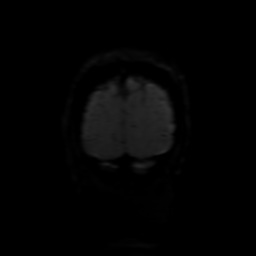
[im 78/78]
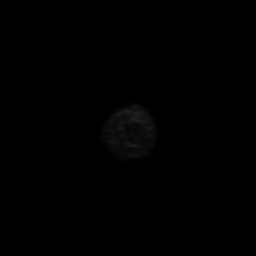

[Series 5: FLAIR · sagittal · 5.0mm · 0.23mm/px · 2 of 26 slices shown (1 of 2)]
[im 1/26]
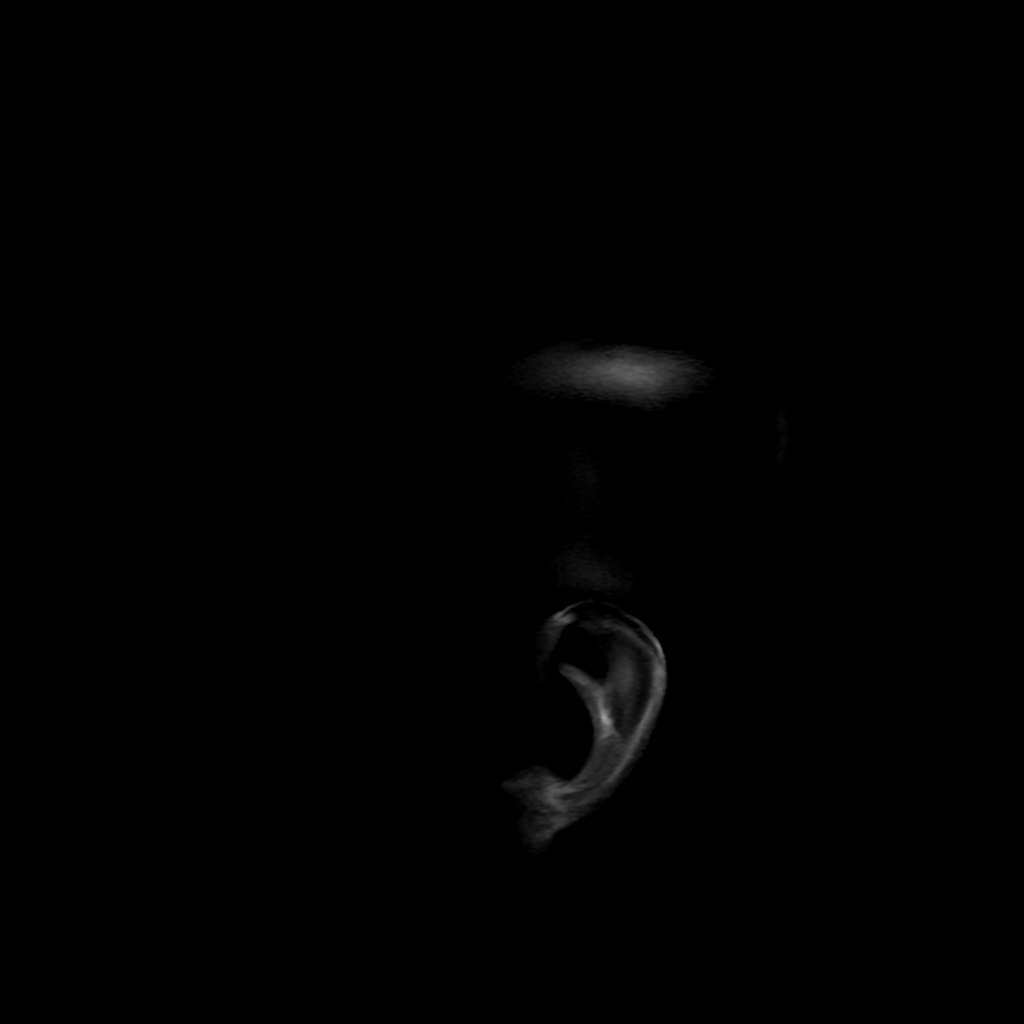
[im 26/26]
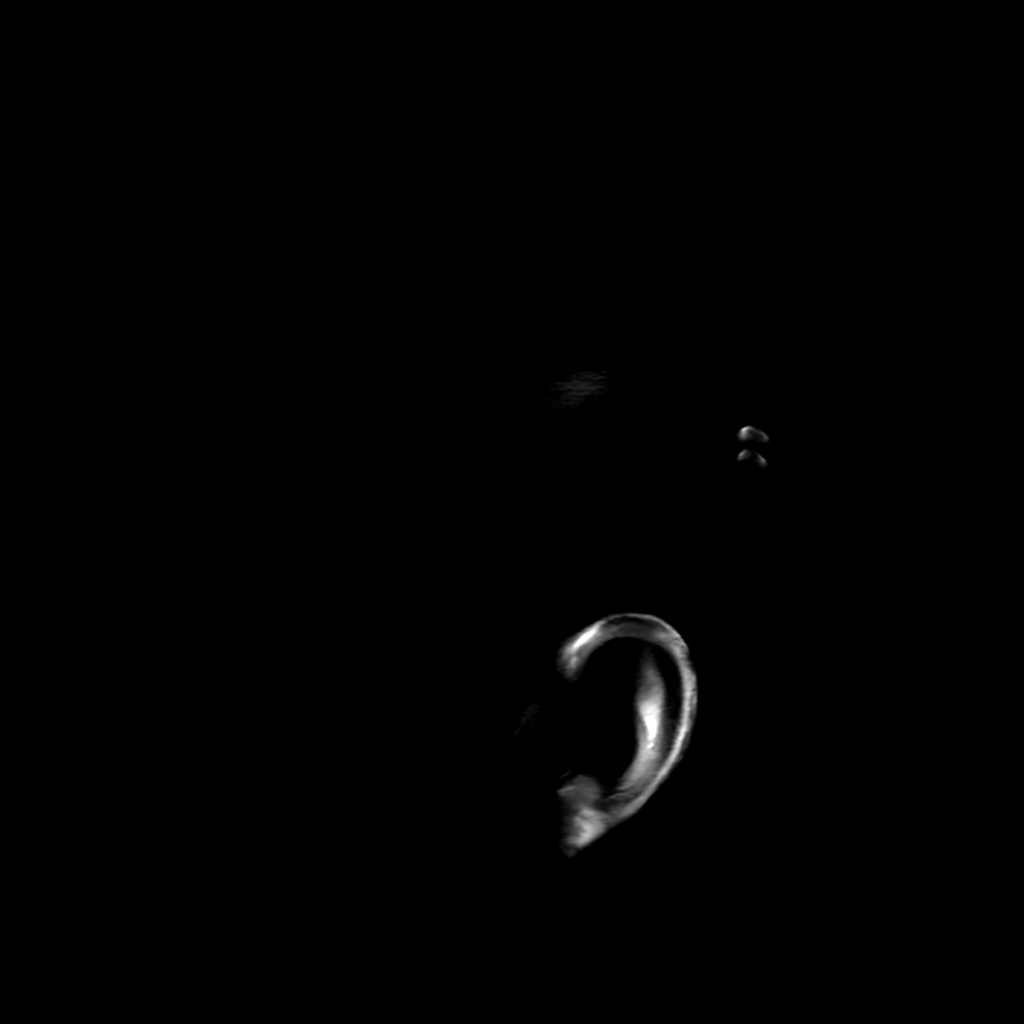

[Series 6: T2 · axial · 5.0mm · 0.23mm/px · 1 of 27 slices shown]
[im 1/27]
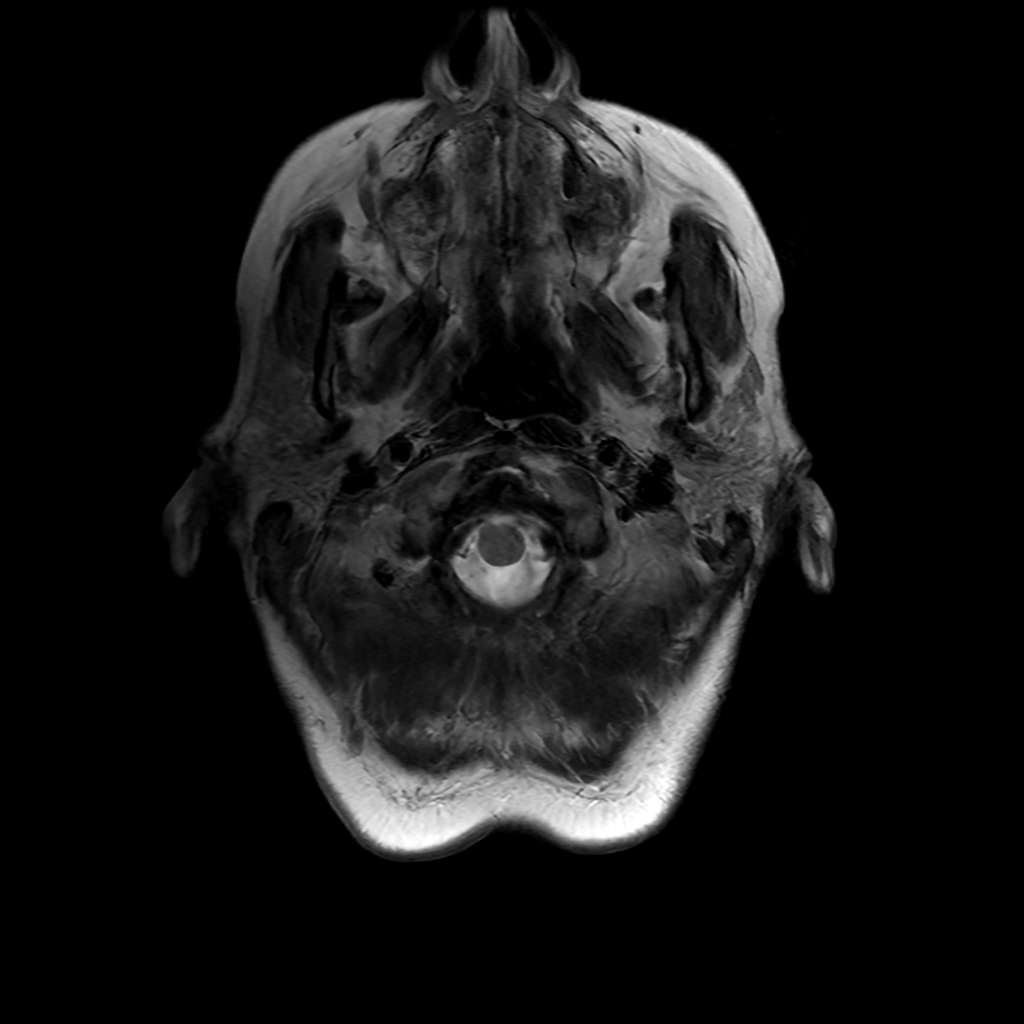

[Series 7: FLAIR · axial · 3.0mm · 0.45mm/px · z∈[-131,+21]mm · 2 of 28 slices shown (2 of 2)]
[im 1/28]
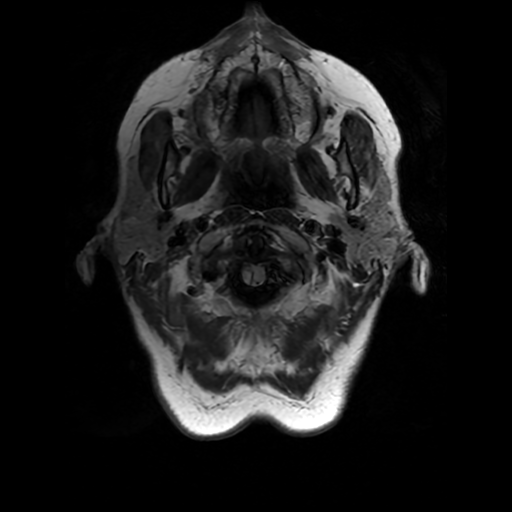
[im 28/28]
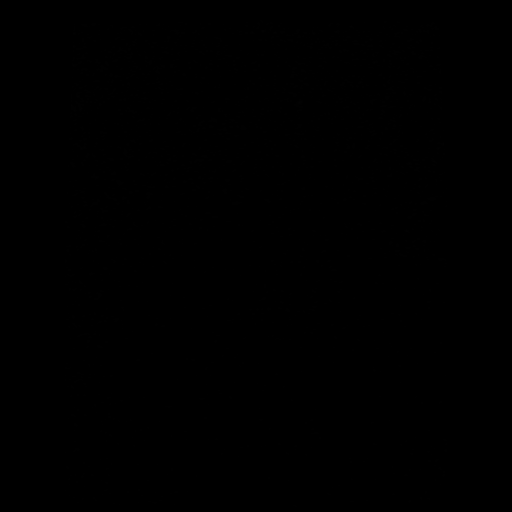

[Series 350: ADC · axial · 3.0mm · 0.94mm/px · z∈[-121,+29]mm · 4 of 53 slices shown (1 of 2)]
[im 1/53]
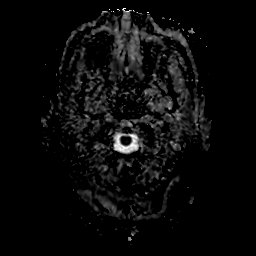
[im 18/53]
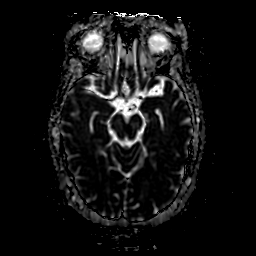
[im 35/53]
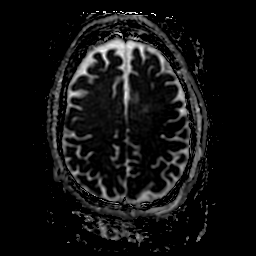
[im 53/53]
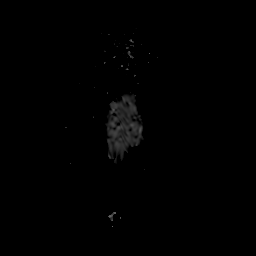

[Series 450: ADC · coronal · 4.0mm · 0.94mm/px · 3 of 39 slices shown (2 of 2)]
[im 1/39]
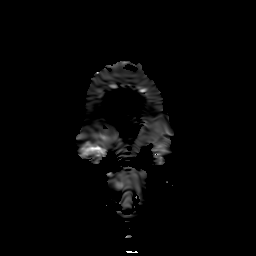
[im 20/39]
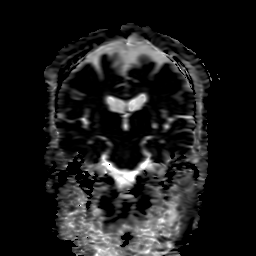
[im 39/39]
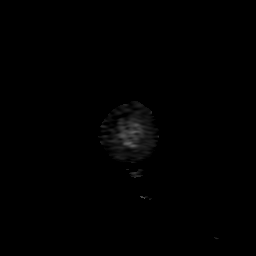

[19 of 48 positions shown; findings below may reference images not displayed]

FINDINGS: Brain: No diffusion-weighted signal abnormality. Remote right
periatrial white matter microhemorrhage. No midline shift,
ventriculomegaly or extra-axial fluid collection. No mass lesion. No
abnormal enhancement. Mild cerebral atrophy with ex vacuo
dilatation. Chronic left basal ganglia lacunar insult. Mild chronic
microvascular ischemic changes.

Vascular: Normal flow voids.

Skull and upper cervical spine: Normal marrow signal.

Sinuses/Orbits: Sequela of bilateral lens replacement. Mild ethmoid
sinus mucosal thickening. Trace bilateral mastoid effusion.

Other: Left parietal scalp swelling and laceration.
IMPRESSION: No acute intracranial process. Chronic left basal ganglia lacunar
insult.

Mild cerebral atrophy and chronic microvascular ischemic changes.

Left parietal scalp laceration and swelling.

## 2020-10-31 IMAGING — CR DG HUMERUS 2V *L*
2 series · 2 of 2 positions shown · non-contrast
Comparison: Left elbow radiographs [DATE]

CLINICAL DATA: Pain following fall

EXAM:
LEFT HUMERUS - 2+ VIEW

[humerus ap]
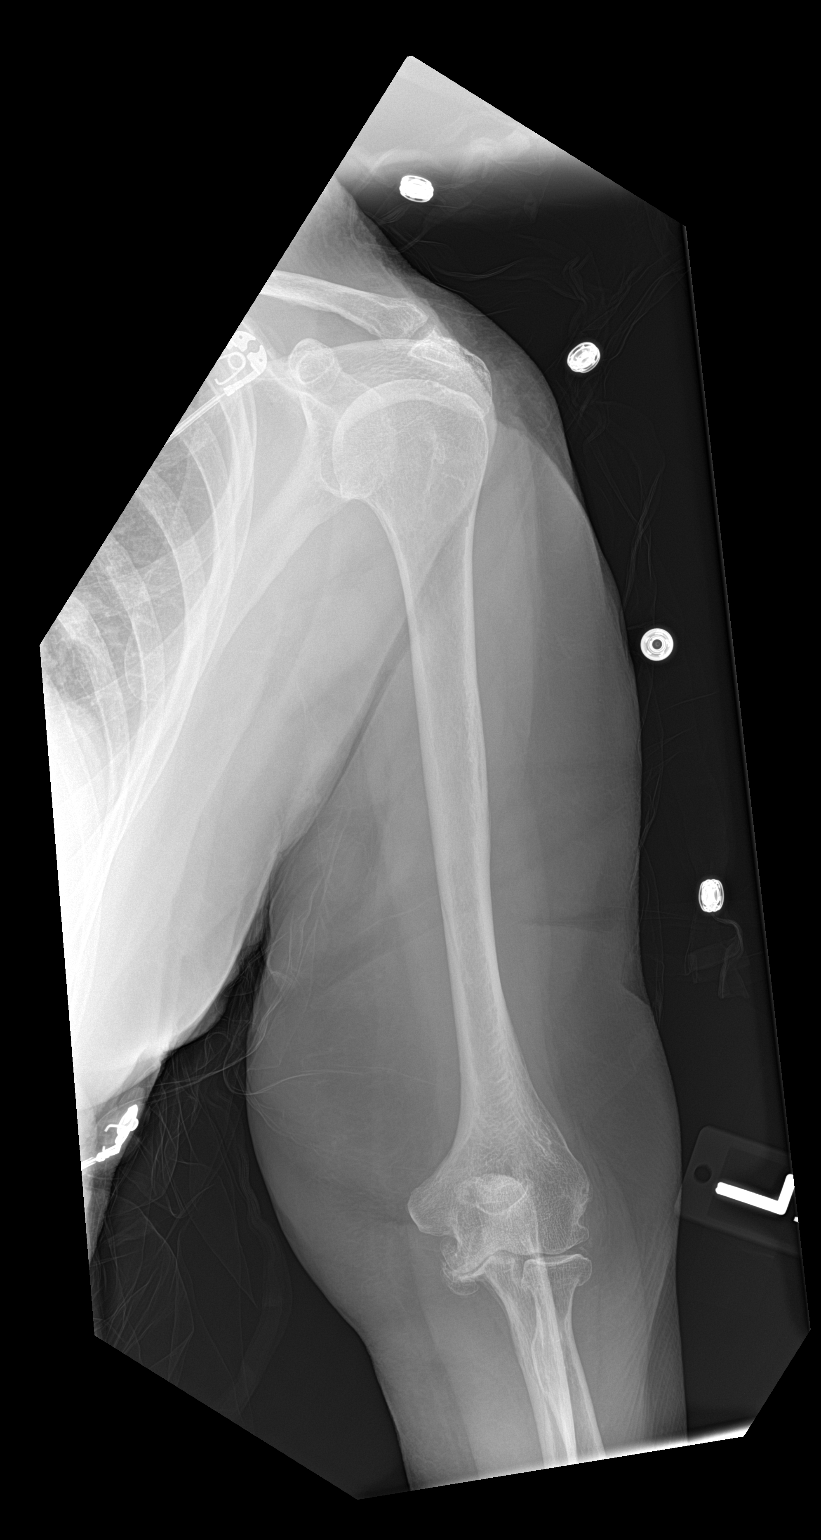

[humerus lat]
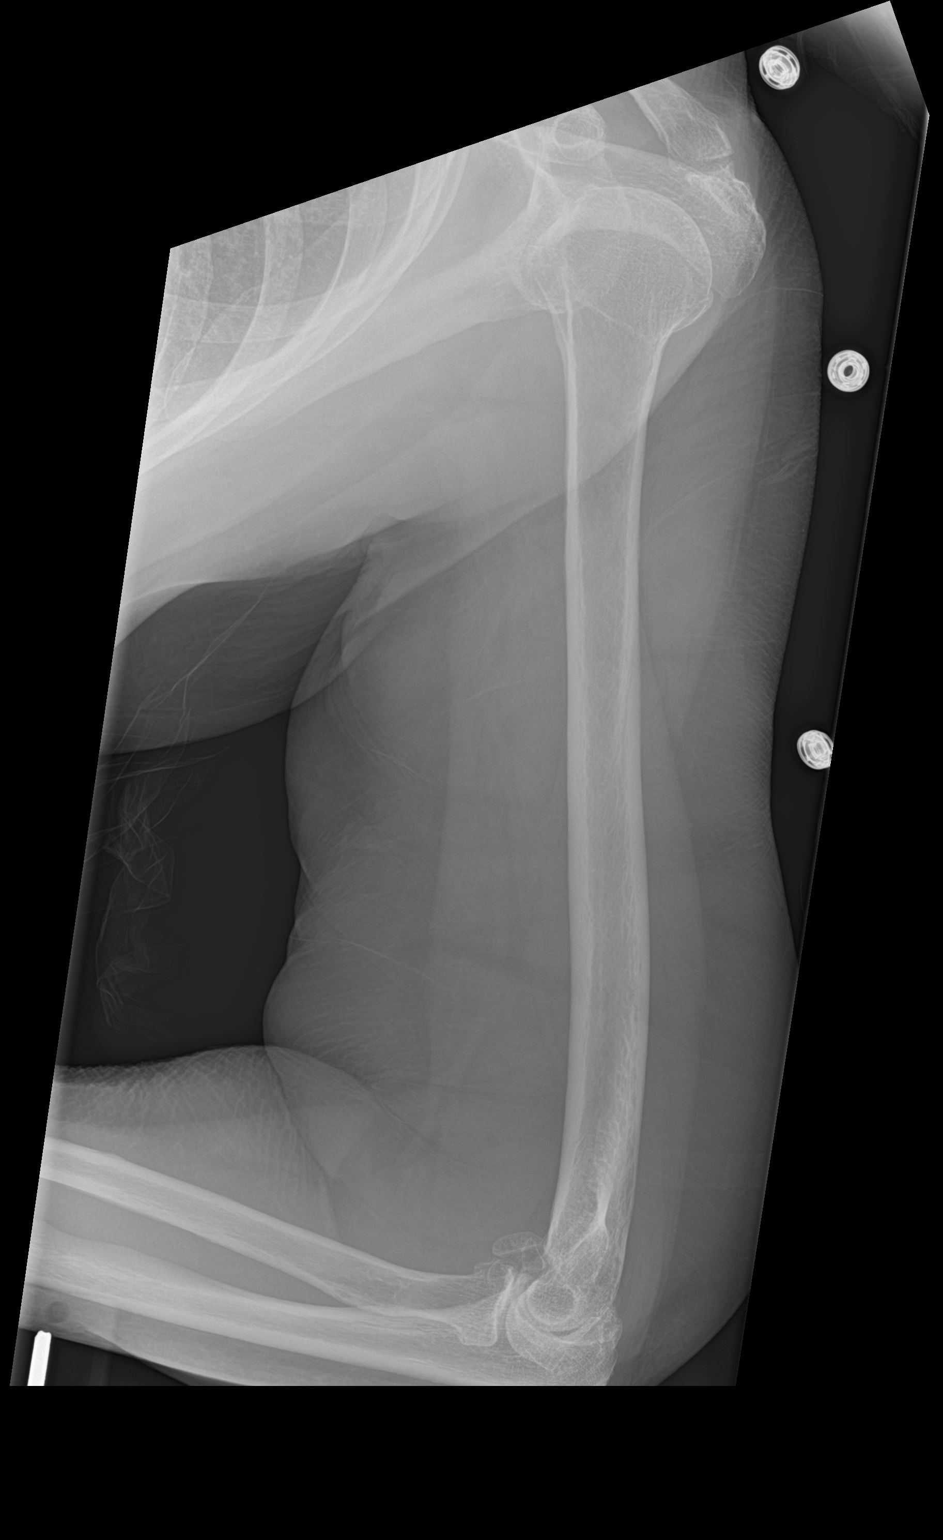

[2 of 2 positions shown; findings below may reference images not displayed]

FINDINGS: Frontal and lateral views obtained. Fracture of the proximal radial
metaphysis described in the elbow report. No humeral fracture or
dislocation evident. There is bony overgrowth in the elbow joint
region. There is mild narrowing of the glenohumeral joint.
IMPRESSION: Fracture proximal radial metaphysis, described in the elbow report.
No other fracture. No dislocation. Arthropathy in the elbow joint
and to a lesser extent in the glenohumeral joint.

## 2020-10-31 IMAGING — MR MR MRA NECK WO/W CM
4 of 7 series · 19 of 48 positions shown · IV contrast (gadavist)
Comparison: Concurrent MRA head.

CLINICAL DATA: dizziness, neurology recommended

EXAM:
MRA NECK WITHOUT AND WITH CONTRAST
TECHNIQUE: Multiplanar and multiecho pulse sequences of the neck were obtained
without and with intravenous contrast. Angiographic images of the
neck were obtained using MRA technique without and with intravenous
contrast.
CONTRAST:  7.5mL GADAVIST GADOBUTROL 1 MMOL/ML IV SOLN

[Series 1200: cor cemra ft · coronal · 1.2mm · 0.59mm/px · 8 of 113 slices shown]
[im 1/113]
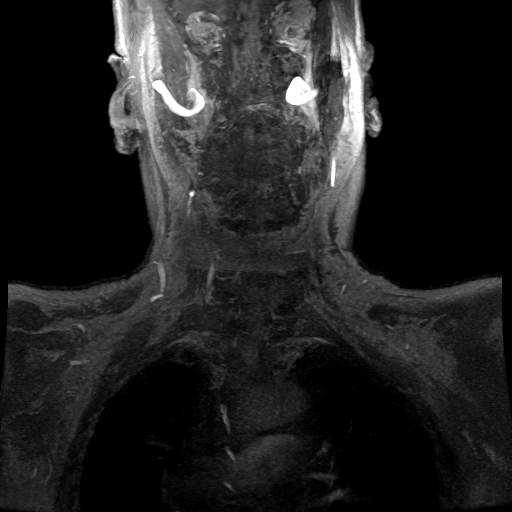
[im 17/113]
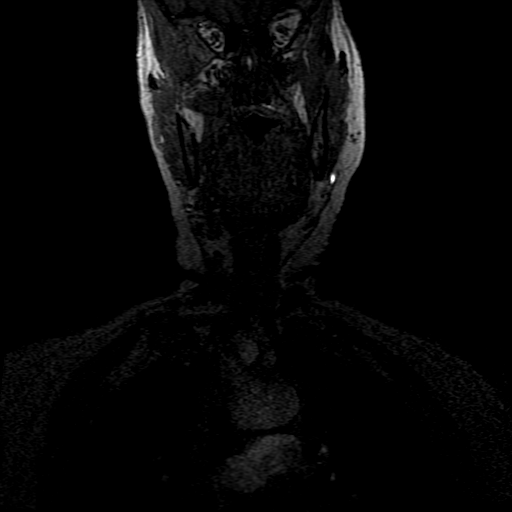
[im 33/113]
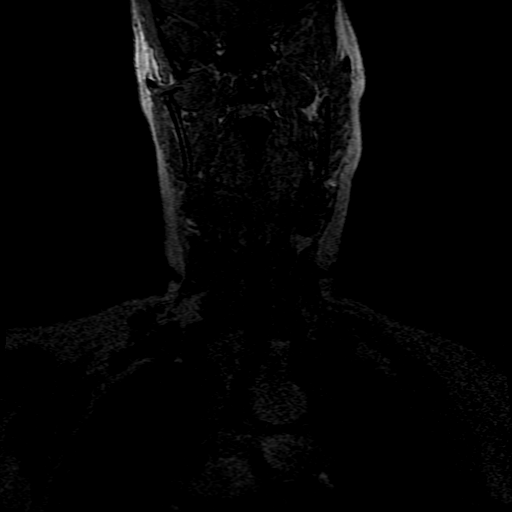
[im 49/113]
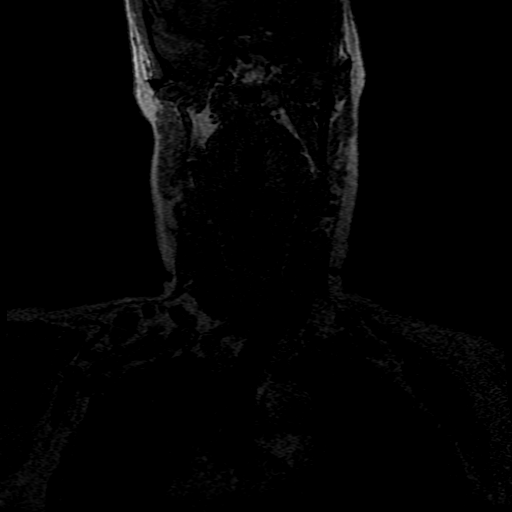
[im 65/113]
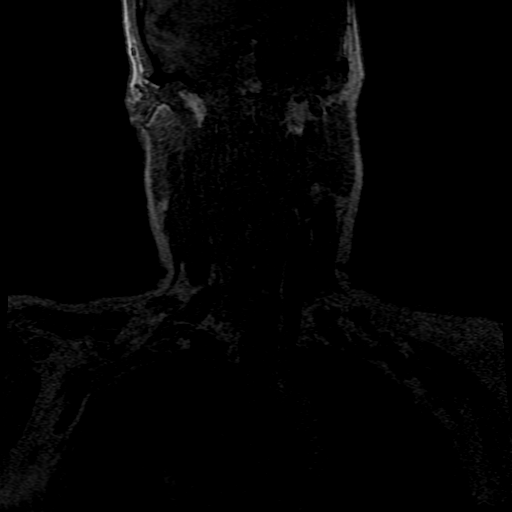
[im 81/113]
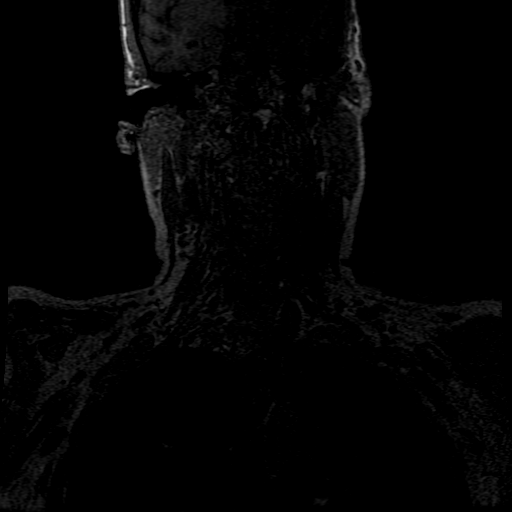
[im 97/113]
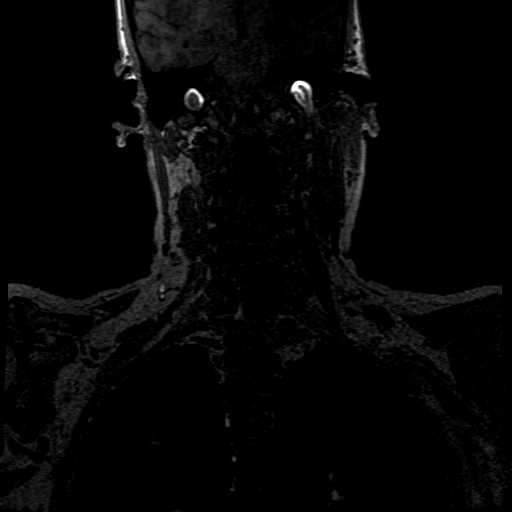
[im 113/113]
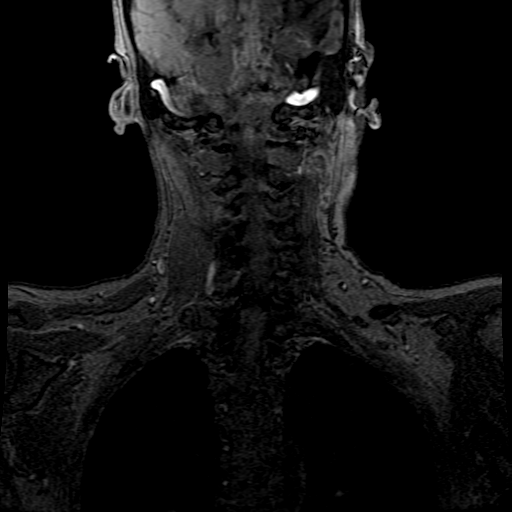

[Series 1201: ph1/cor cemra ft · coronal · 1.2mm · 0.59mm/px · 5 of 112 slices shown]
[im 1/112]
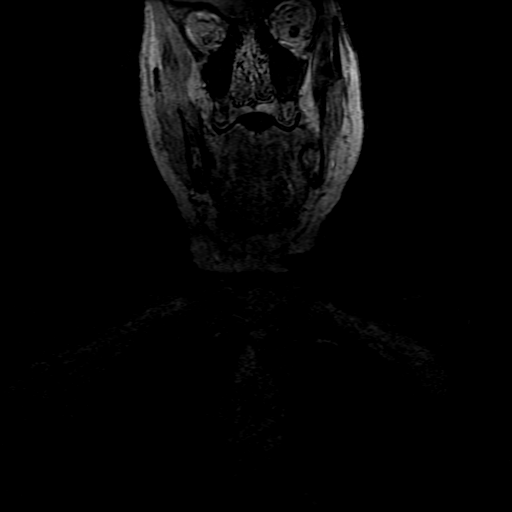
[im 19/112]
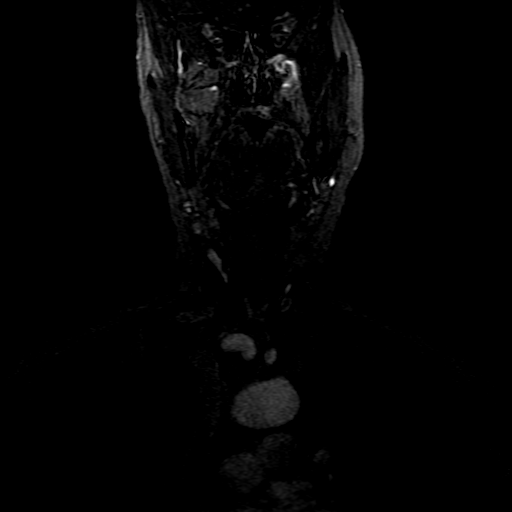
[im 38/112]
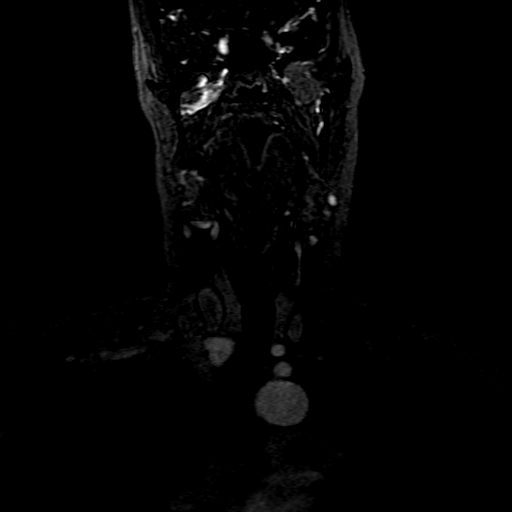
[im 56/112]
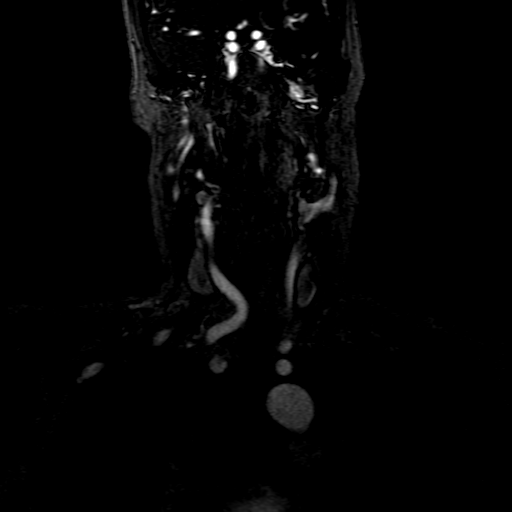
[im 93/112]
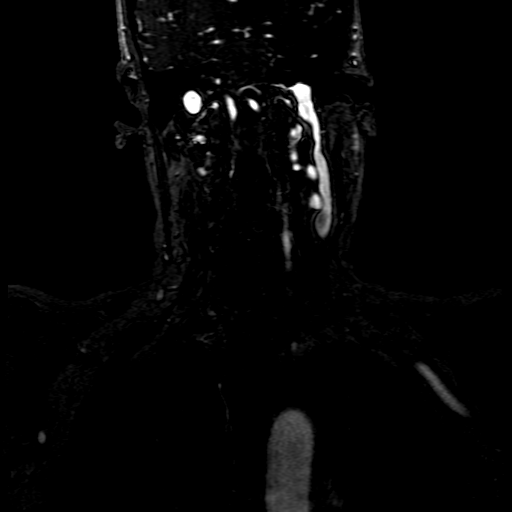

[Series 1202: ph2/cor cemra ft · coronal · 1.2mm · 0.59mm/px · 3 of 113 slices shown]
[im 17/113]
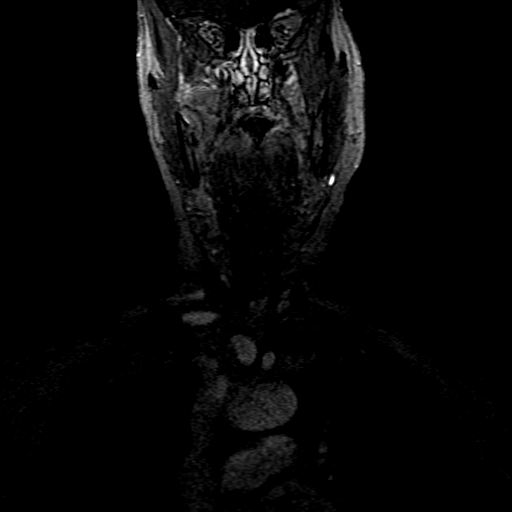
[im 65/113]
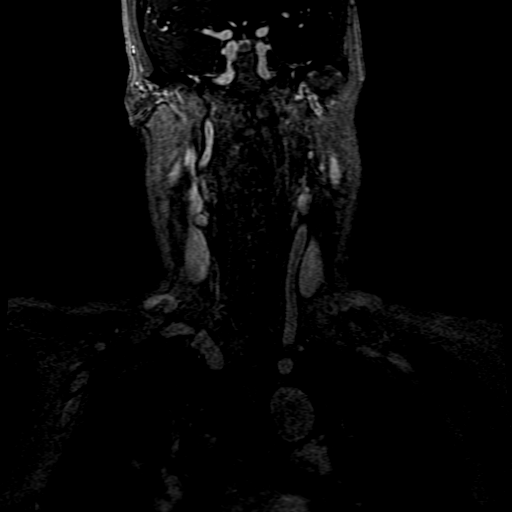
[im 97/113]
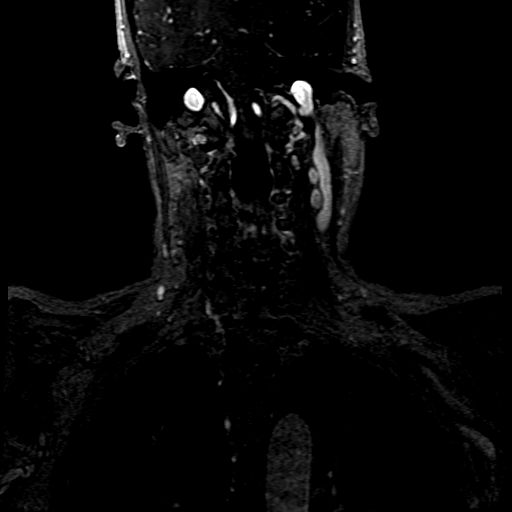

[((date))-((date)) · coronal · 1.2mm · 0.59mm/px · 3 of 113 slices shown]
[im 17/113]
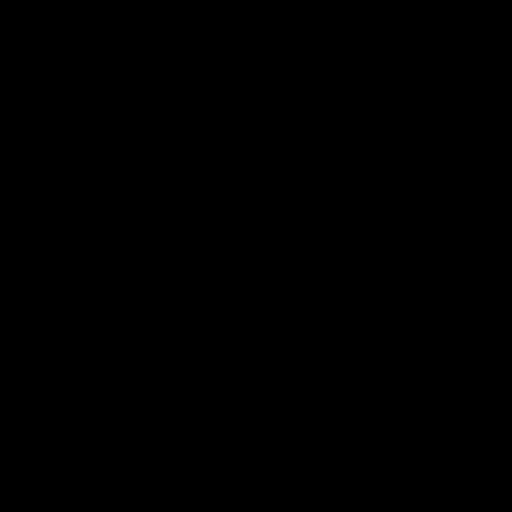
[im 65/113]
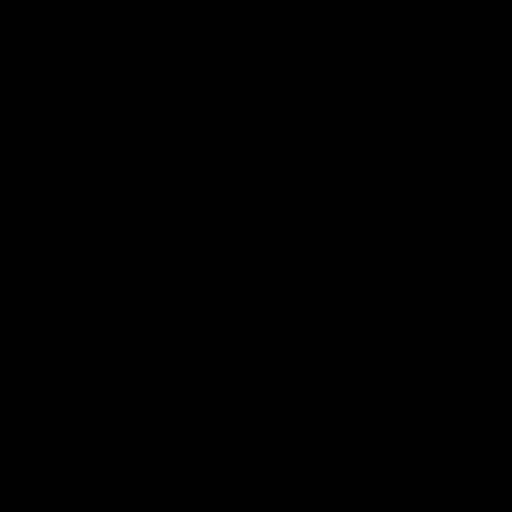
[im 97/113]
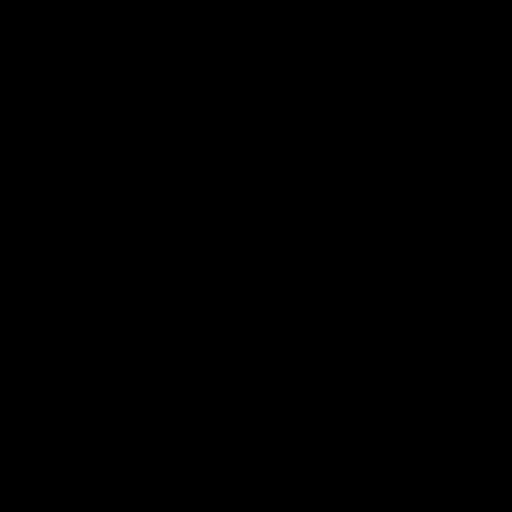

[19 of 48 positions shown; findings below may reference images not displayed]

FINDINGS: Three-vessel aortic arch.  Patent great vessel origins.

Bilateral CCA is are patent. Right carotid bifurcation atheromatous
disease with approximately 40% luminal narrowing of the proximal
ICA. Mild irregularity of the mid right ICA without significant
narrowing likely reflects atheromatous disease.

Left bifurcation atheromatous disease with 20 is 30% luminal
narrowing of the proximal ICA. Patent left vertebral artery. Patent
right vertebral artery with mild narrowing proximally.
IMPRESSION: 40% narrowing of the proximal right ICA.

20-30% proximal left ICA narrowing.

Mild proximal right vertebral artery narrowing.

## 2020-10-31 IMAGING — MR MR HEAD WO/W CM
4 series · 48 of 48 positions shown · IV contrast (gadavist)
Comparison: [DATE] and prior.

CLINICAL DATA: dizziness

EXAM:
MRI HEAD WITHOUT AND WITH CONTRAST
TECHNIQUE: Multiplanar, multiecho pulse sequences of the brain and surrounding
structures were obtained without and with intravenous contrast.
CONTRAST:  7.5mL GADAVIST GADOBUTROL 1 MMOL/ML IV SOLN

[Series 3: DWI · axial · 3.0mm · 0.94mm/px · z∈[-121,+29]mm · 18 of 106 slices shown (1 of 2)]
[im 1/106]
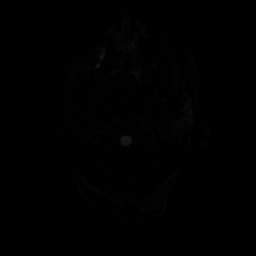
[im 7/106]
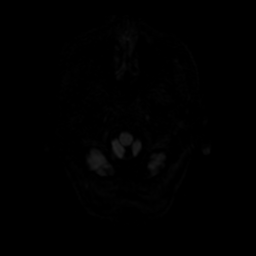
[im 13/106]
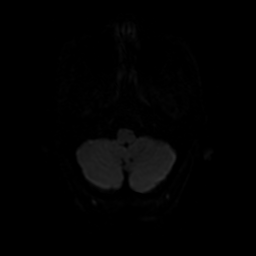
[im 19/106]
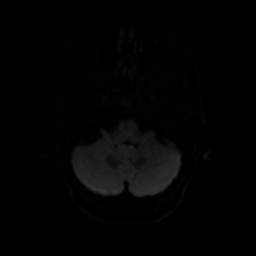
[im 25/106]
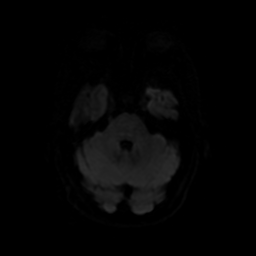
[im 31/106]
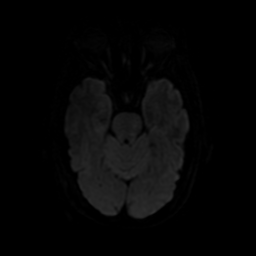
[im 38/106]
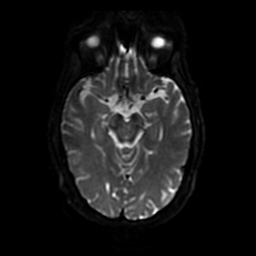
[im 44/106]
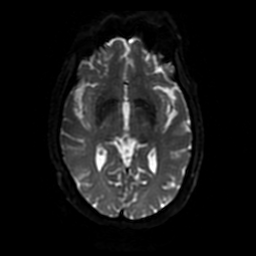
[im 50/106]
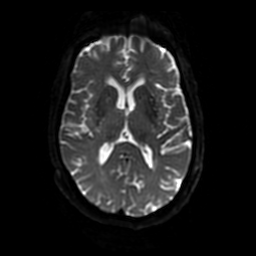
[im 56/106]
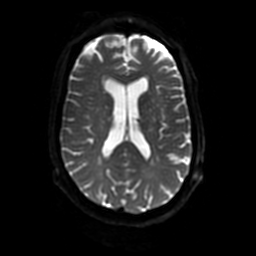
[im 62/106]
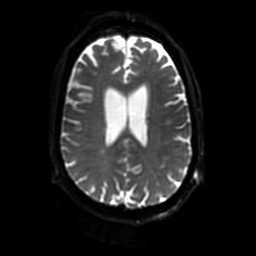
[im 68/106]
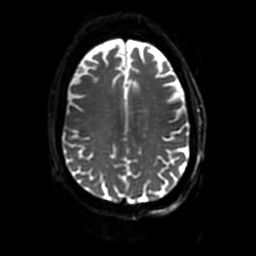
[im 75/106]
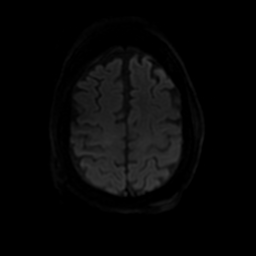
[im 81/106]
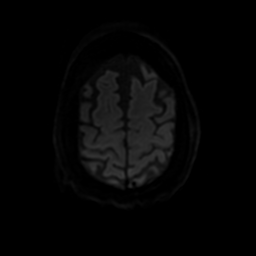
[im 87/106]
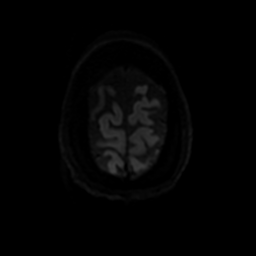
[im 93/106]
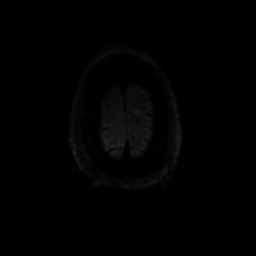
[im 99/106]
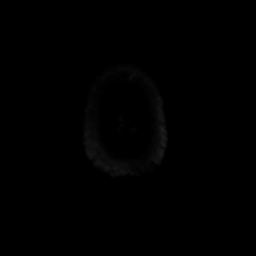
[im 106/106]
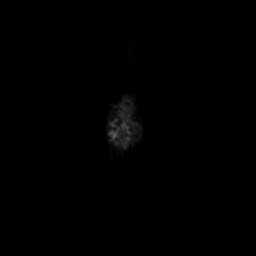

[Series 4: DWI · coronal · 4.0mm · 0.94mm/px · 14 of 78 slices shown (2 of 2)]
[im 1/78]
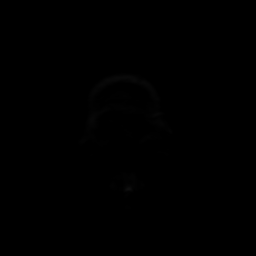
[im 6/78]
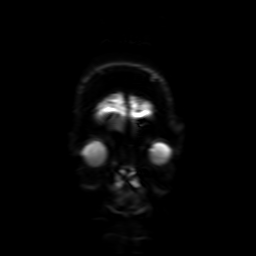
[im 12/78]
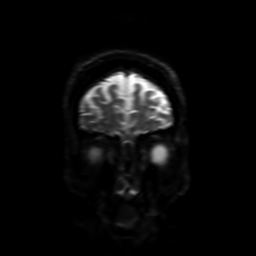
[im 18/78]
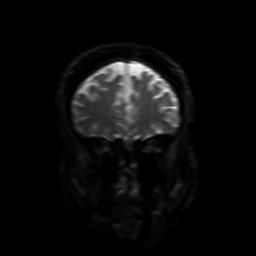
[im 24/78]
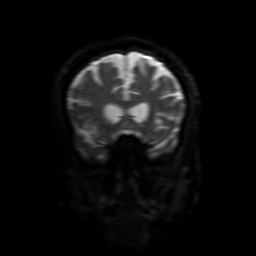
[im 30/78]
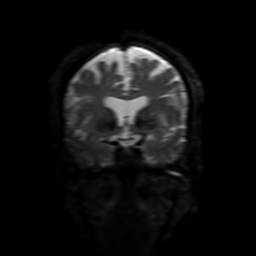
[im 36/78]
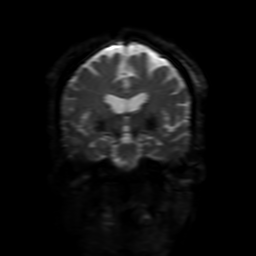
[im 42/78]
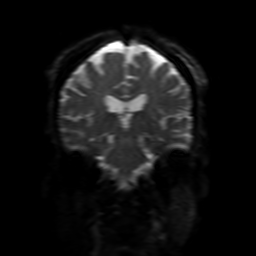
[im 48/78]
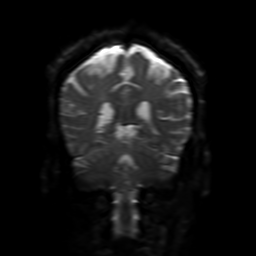
[im 54/78]
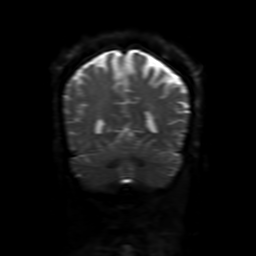
[im 60/78]
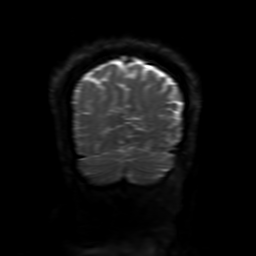
[im 66/78]
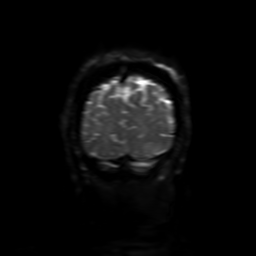
[im 72/78]
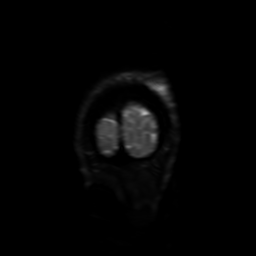
[im 78/78]
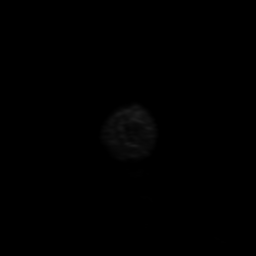

[Series 350: ADC · axial · 3.0mm · 0.94mm/px · z∈[-121,+29]mm · 9 of 53 slices shown (1 of 2)]
[im 1/53]
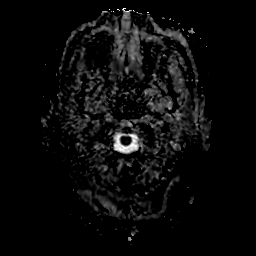
[im 7/53]
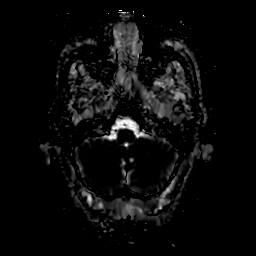
[im 14/53]
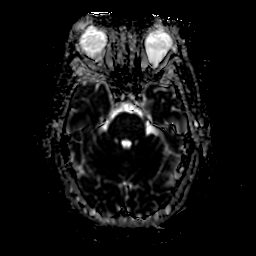
[im 20/53]
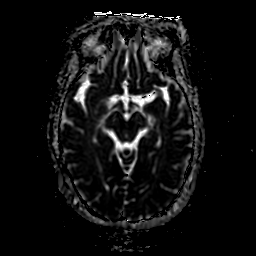
[im 27/53]
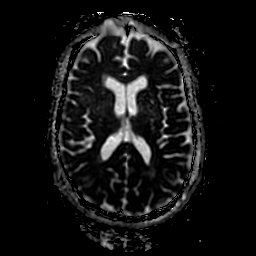
[im 33/53]
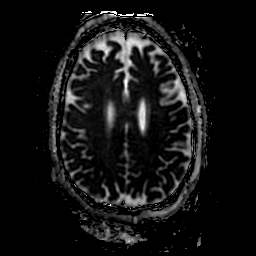
[im 40/53]
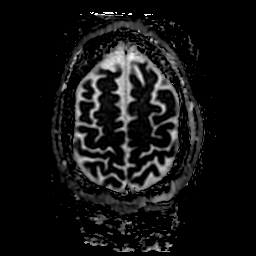
[im 46/53]
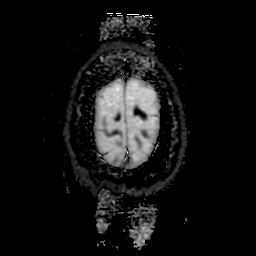
[im 53/53]
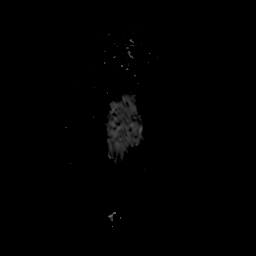

[Series 450: ADC · coronal · 4.0mm · 0.94mm/px · 7 of 39 slices shown (2 of 2)]
[im 1/39]
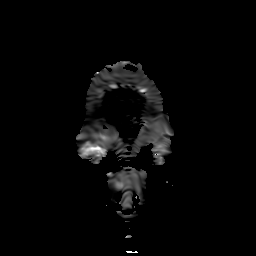
[im 7/39]
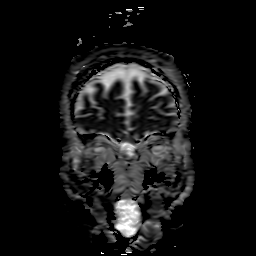
[im 13/39]
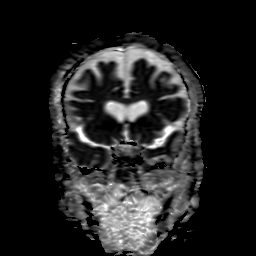
[im 20/39]
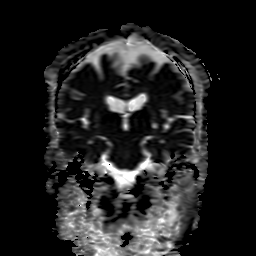
[im 26/39]
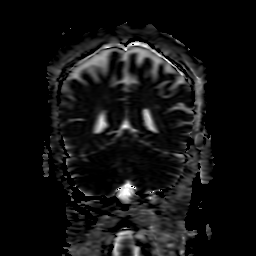
[im 32/39]
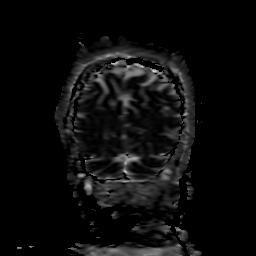
[im 39/39]
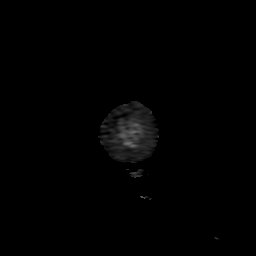

[48 of 48 positions shown; findings below may reference images not displayed]

FINDINGS: Brain: No diffusion-weighted signal abnormality. Remote right
periatrial white matter microhemorrhage. No midline shift,
ventriculomegaly or extra-axial fluid collection. No mass lesion. No
abnormal enhancement. Mild cerebral atrophy with ex vacuo
dilatation. Chronic left basal ganglia lacunar insult. Mild chronic
microvascular ischemic changes.

Vascular: Normal flow voids.

Skull and upper cervical spine: Normal marrow signal.

Sinuses/Orbits: Sequela of bilateral lens replacement. Mild ethmoid
sinus mucosal thickening. Trace bilateral mastoid effusion.

Other: Left parietal scalp swelling and laceration.
IMPRESSION: No acute intracranial process. Chronic left basal ganglia lacunar
insult.

Mild cerebral atrophy and chronic microvascular ischemic changes.

Left parietal scalp laceration and swelling.

## 2020-10-31 IMAGING — CT CT HEAD W/O CM
4 series · 16 of 47 positions shown, 18 images · non-contrast
Comparison: Prior head CT examinations [DATE] and earlier.

CLINICAL DATA: Head trauma, minor. Additional history provided:
Fall striking head, laceration.

EXAM:
CT HEAD WITHOUT CONTRAST
TECHNIQUE: Contiguous axial images were obtained from the base of the skull
through the vertex without intravenous contrast.

[Series 3: head without · axial · non-contrast · 0.45mm/px · z∈[-51,+74]mm · 7 of 35 slices shown, 9 images]
[im 5/35  brain]
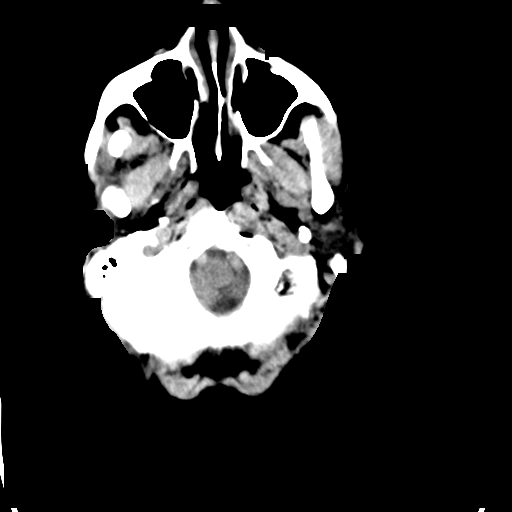
[im 5/35  bone]
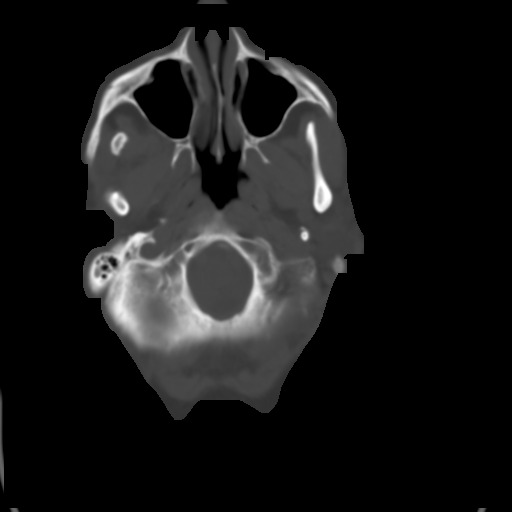
[im 9/35  brain]
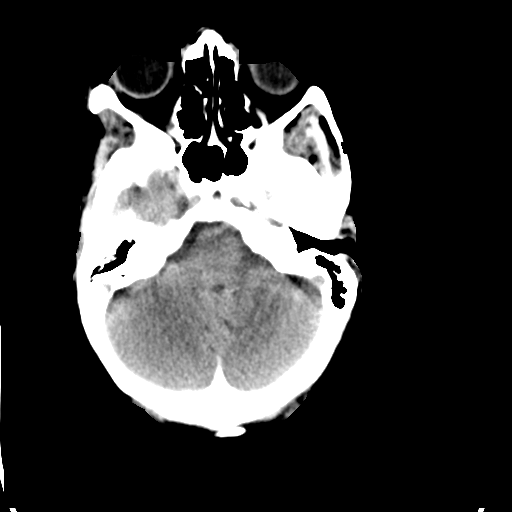
[im 13/35  brain]
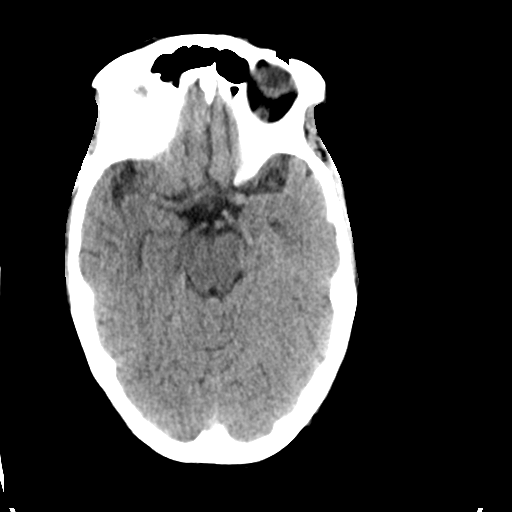
[im 18/35  brain]
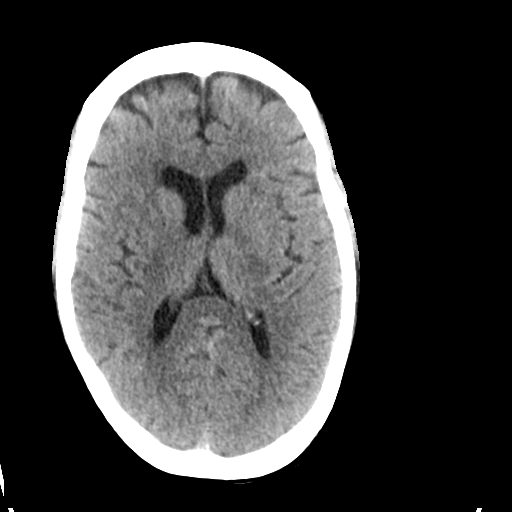
[im 22/35  brain]
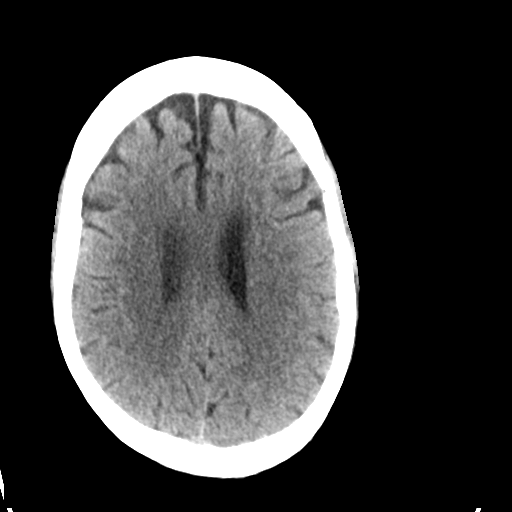
[im 22/35  bone]
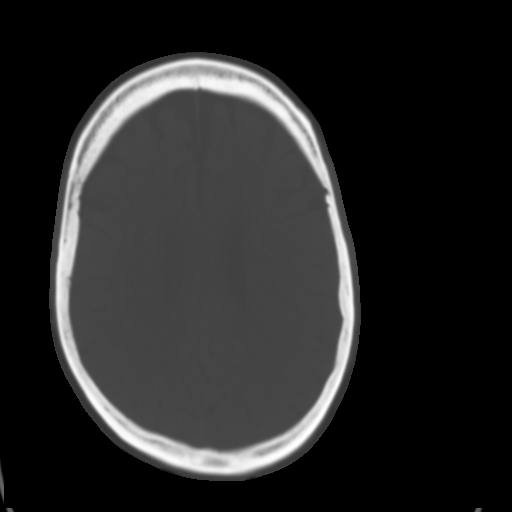
[im 26/35  brain]
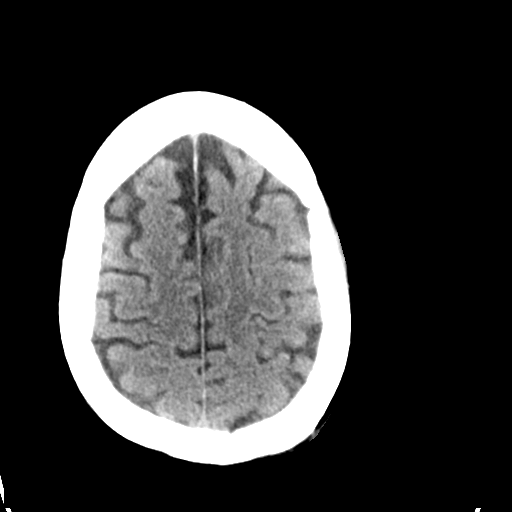
[im 30/35  brain]
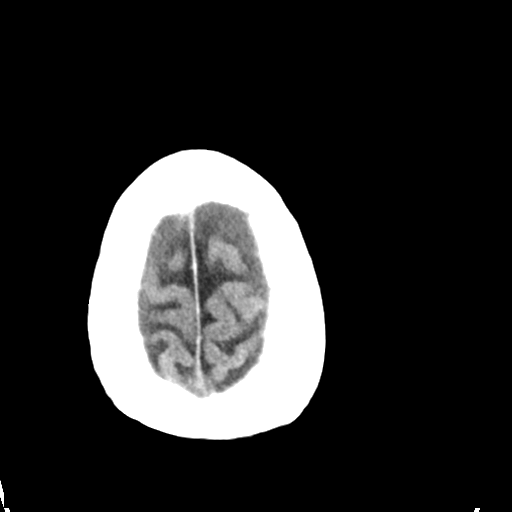

[Series 4: head bone · axial · 0.45mm/px · z∈[-55,-21]mm · 3 of 87 slices shown]
[im 9/87  bone]
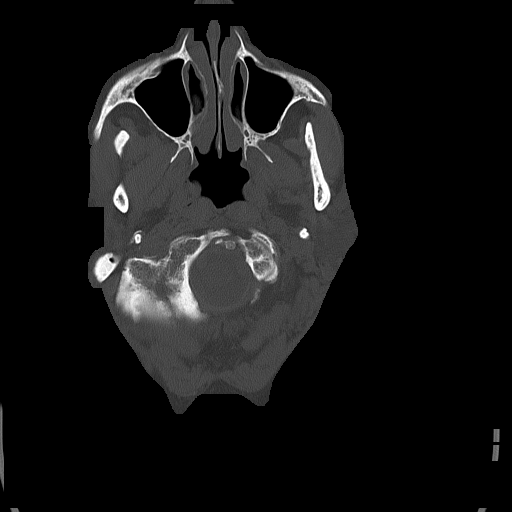
[im 18/87  bone]
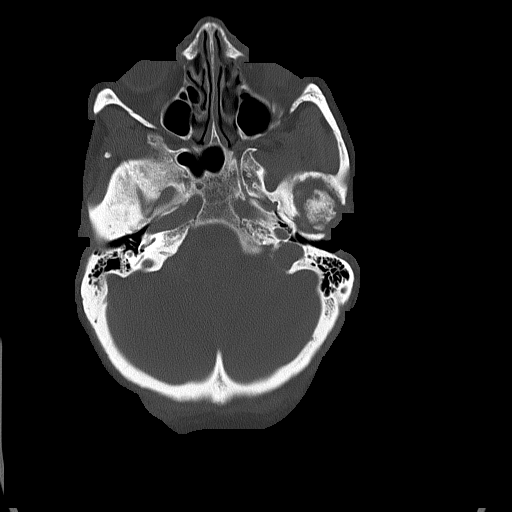
[im 26/87  bone]
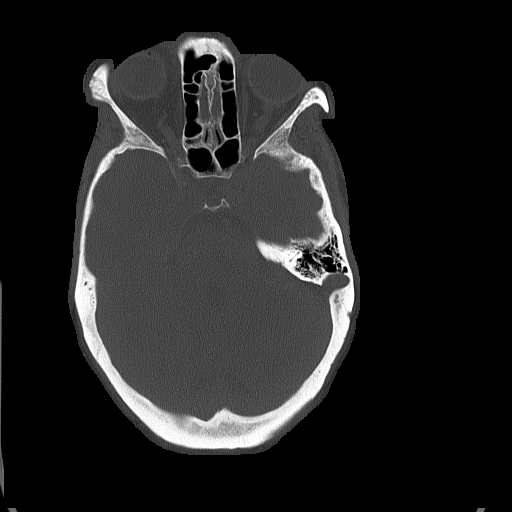

[Series 5: head without cor · coronal · non-contrast · 0.31mm/px · 3 of 72 slices shown]
[im 24/72  brain]
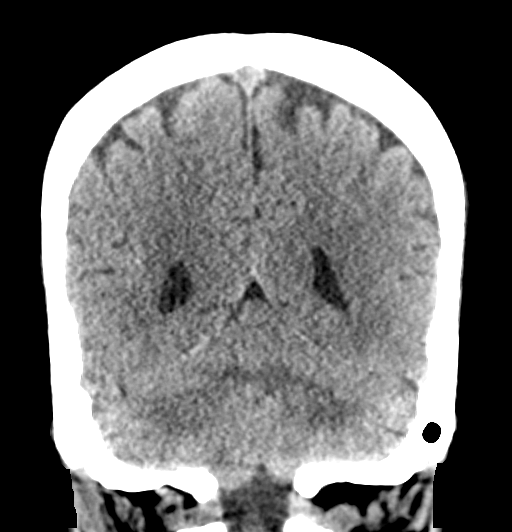
[im 32/72  brain]
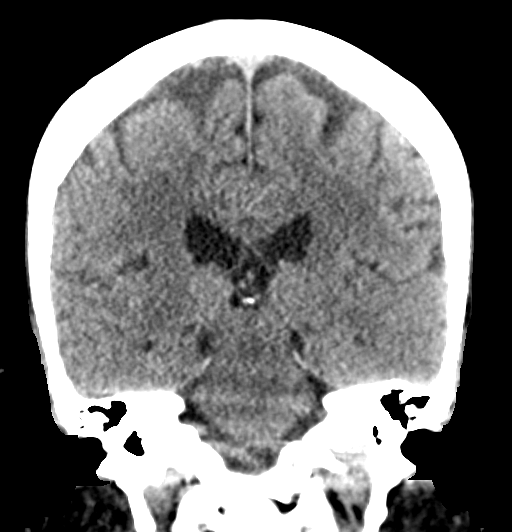
[im 40/72  brain]
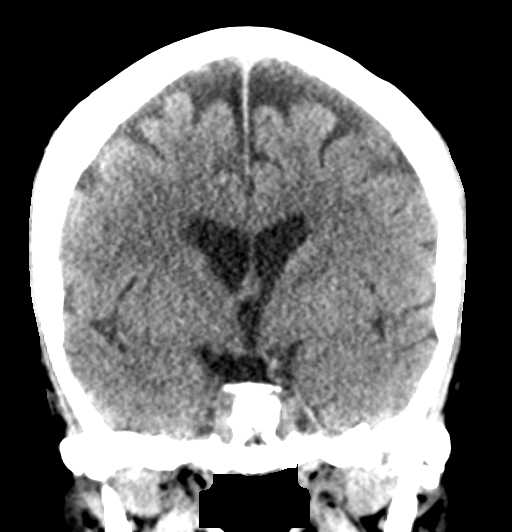

[Series 6: head without sag · sagittal · non-contrast · 0.31mm/px · 3 of 50 slices shown]
[im 17/50  brain]
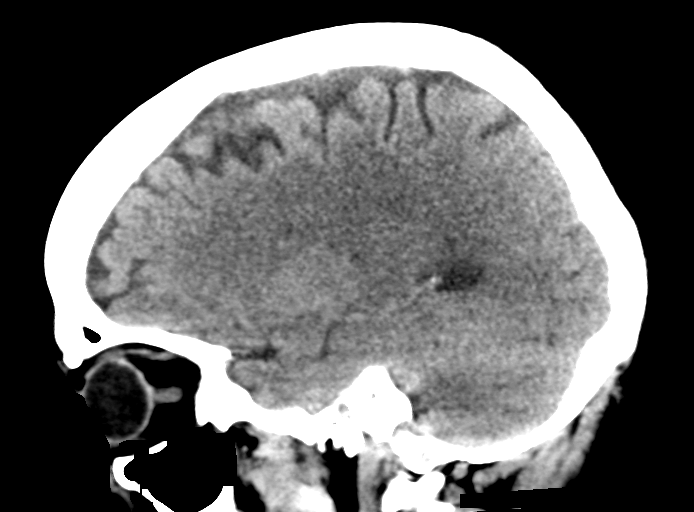
[im 25/50  brain]
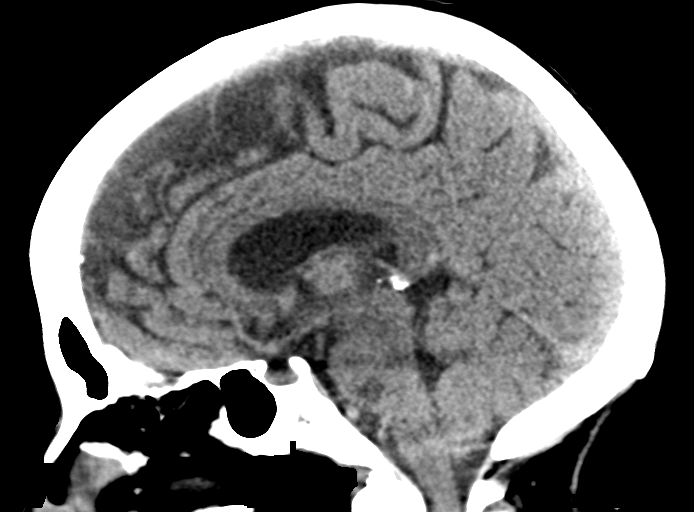
[im 33/50  brain]
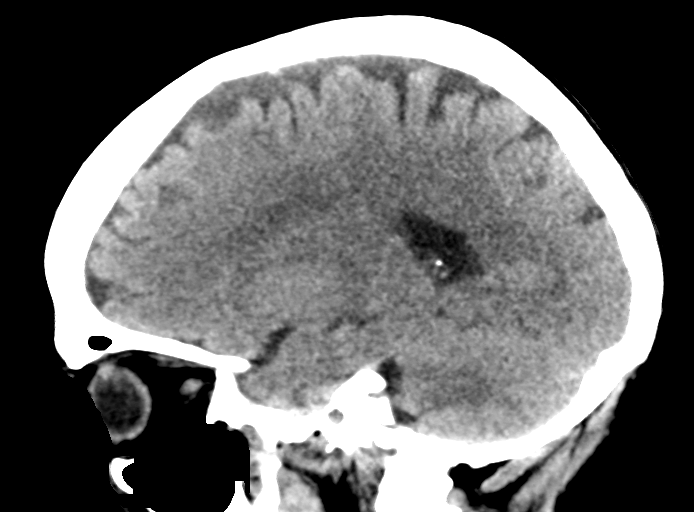

[16 of 47 positions shown; findings below may reference images not displayed]

FINDINGS: Brain:

There is no acute intracranial hemorrhage.

No demarcated cortical infarct.

No extra-axial fluid collection.

No evidence of intracranial mass.

No midline shift.

Vascular: No hyperdense vessel.  Atherosclerotic calcifications.

Skull: Normal. Negative for fracture or focal lesion.

Sinuses/Orbits: Visualized orbits show no acute finding. Minimal
ethmoid sinus mucosal thickening.

Other: Posterior scalp soft tissue swelling.
IMPRESSION: No evidence of acute intracranial abnormality.

Posterior scalp soft tissue swelling.

Minimal ethmoid sinus mucosal thickening.

## 2020-10-31 IMAGING — MR MR MRA HEAD W/O CM
2 series · 20 of 48 positions shown · non-contrast
Comparison: [DATE] head CT and prior

CLINICAL DATA: Dizziness, non-specific

EXAM:
MRA HEAD WITHOUT CONTRAST
TECHNIQUE: Angiographic images of the Circle of Willis were obtained using MRA
technique without intravenous contrast.

[Series 3: ax (id) · axial · 1.0mm · 0.43mm/px · z∈[-44,+43]mm · 16 of 188 slices shown]
[im 1/188]
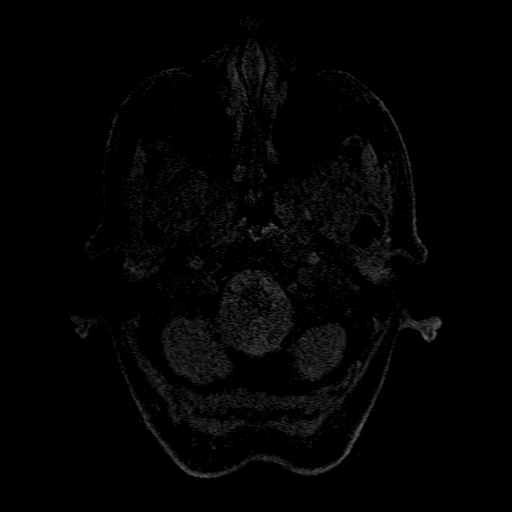
[im 5/188]
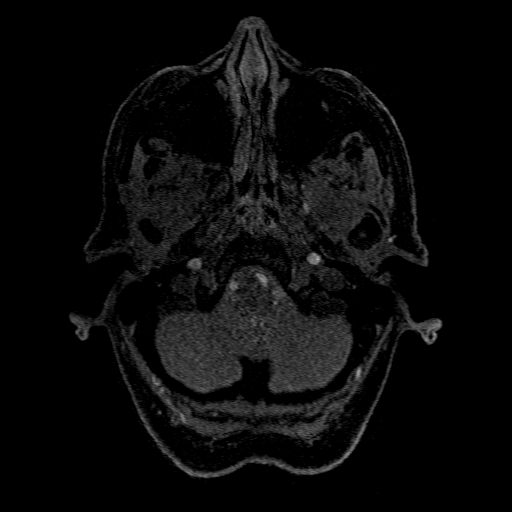
[im 9/188]
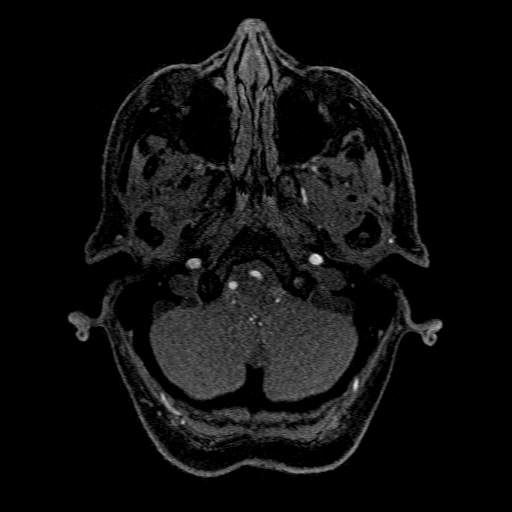
[im 14/188]
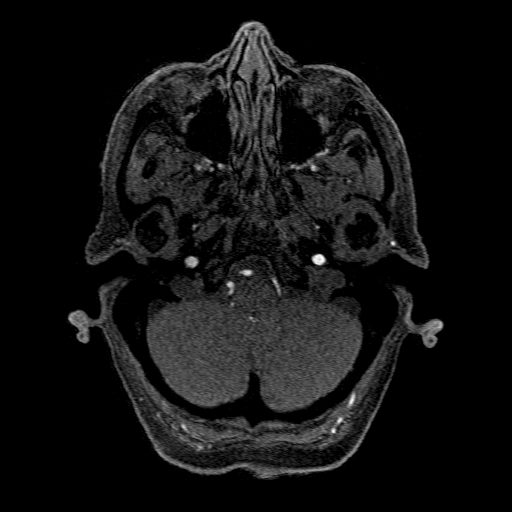
[im 18/188]
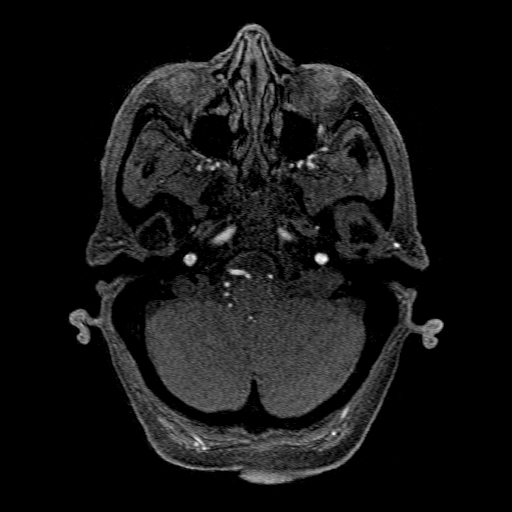
[im 22/188]
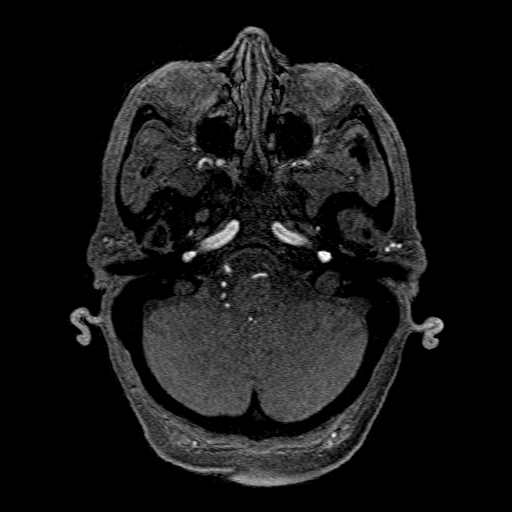
[im 31/188]
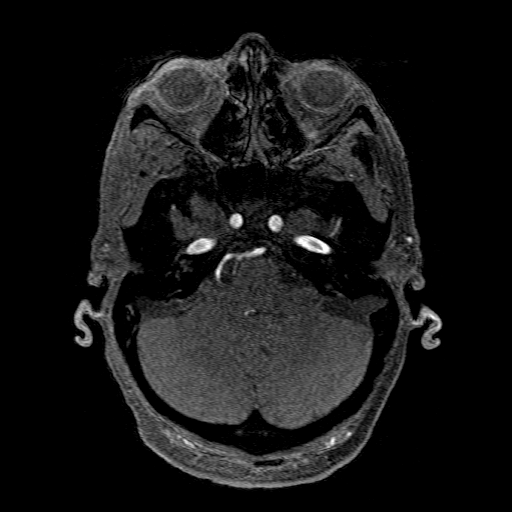
[im 35/188]
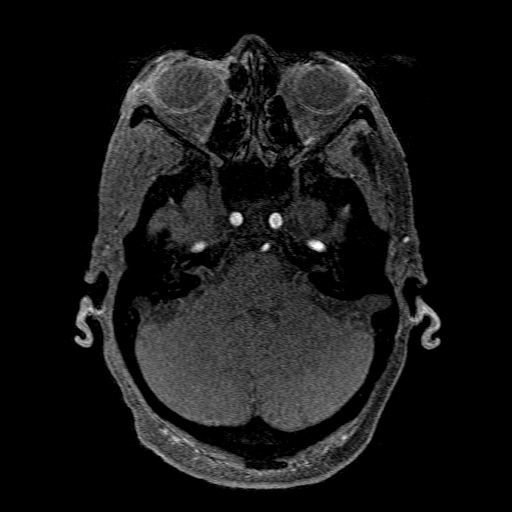
[im 57/188]
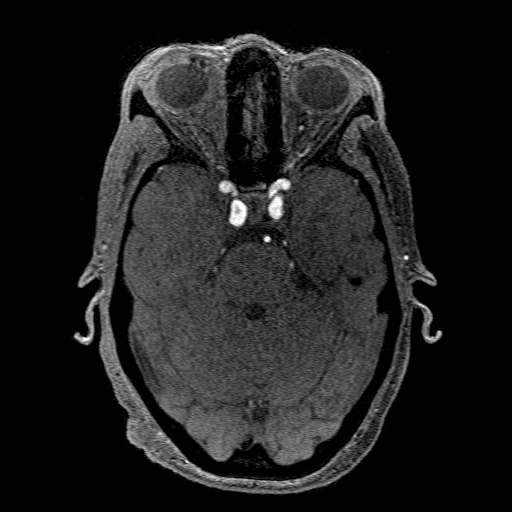
[im 83/188]
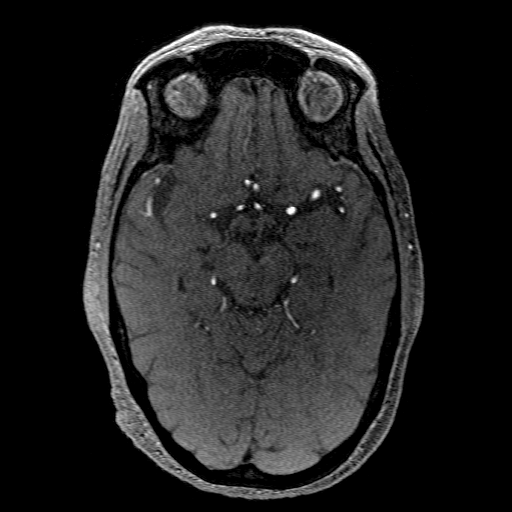
[im 96/188]
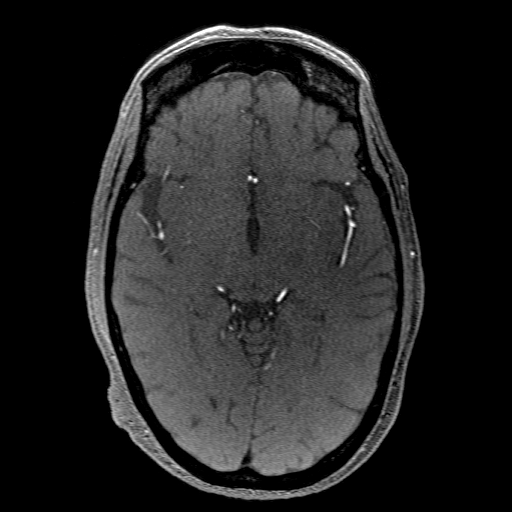
[im 105/188]
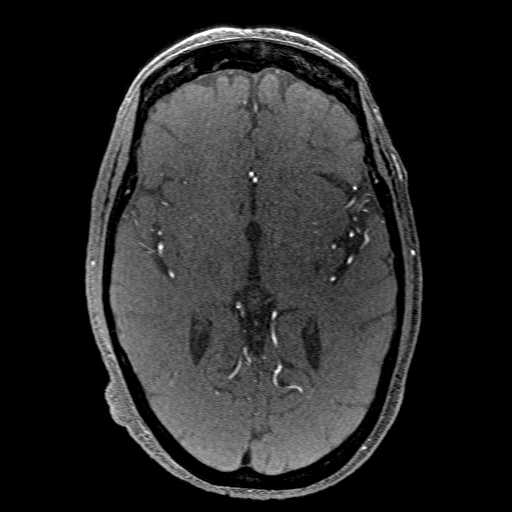
[im 131/188]
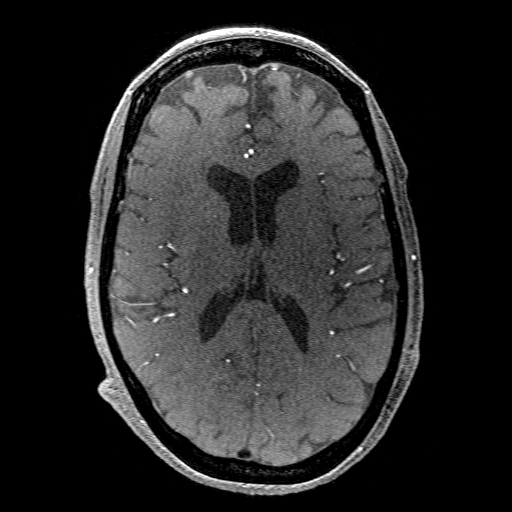
[im 153/188]
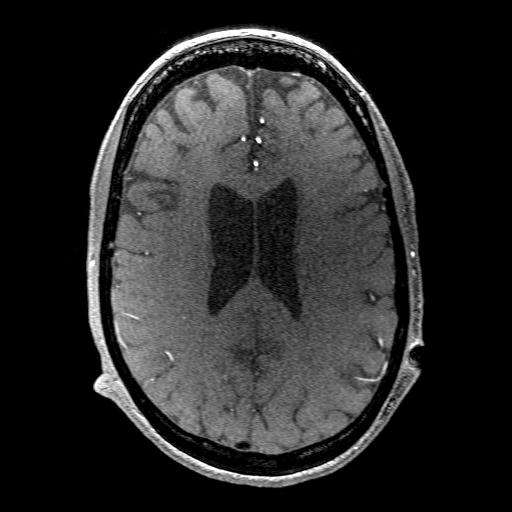
[im 157/188]
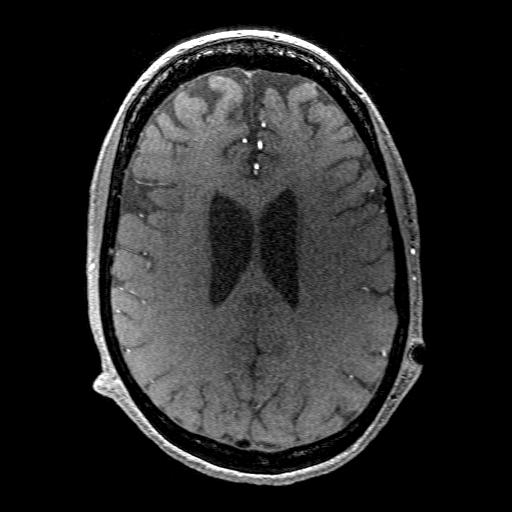
[im 179/188]
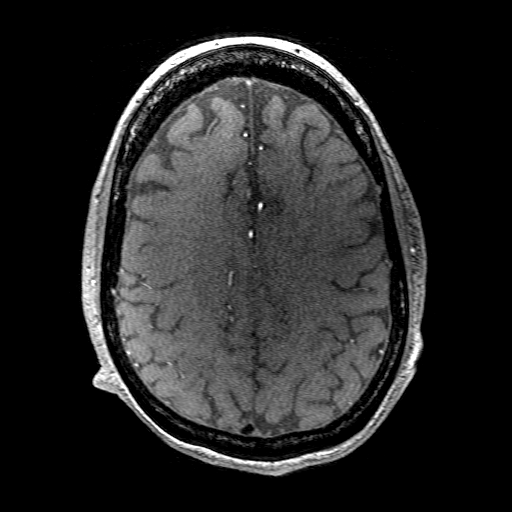

[Series 301: pjn:ax (id) · sagittal · 1.0mm · 0.43mm/px · 4 of 19 slices shown]
[im 1/19]
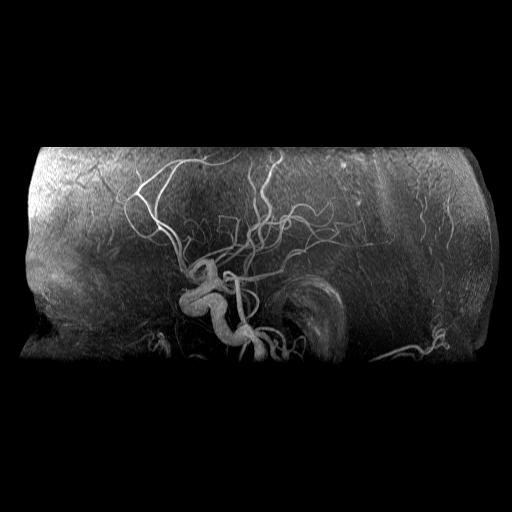
[im 7/19]
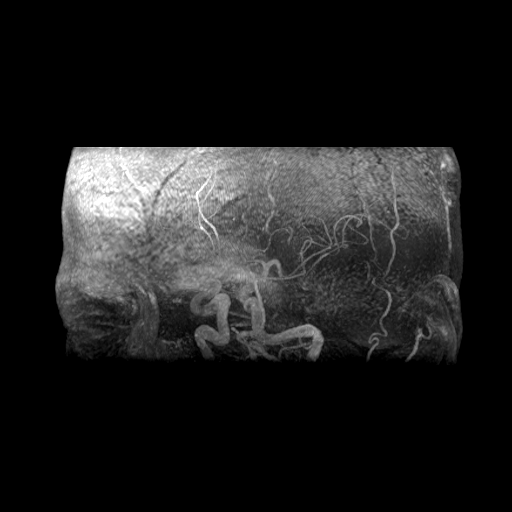
[im 13/19]
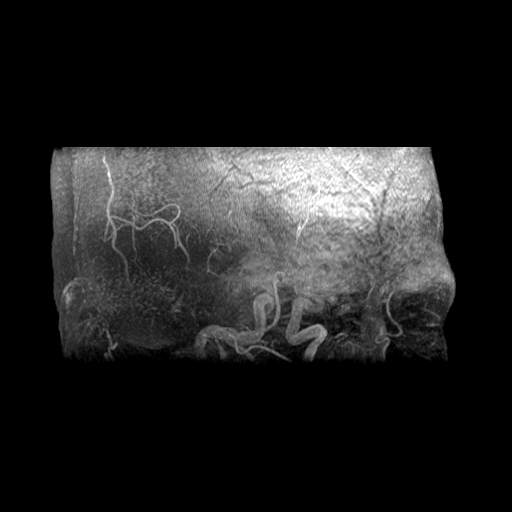
[im 19/19]
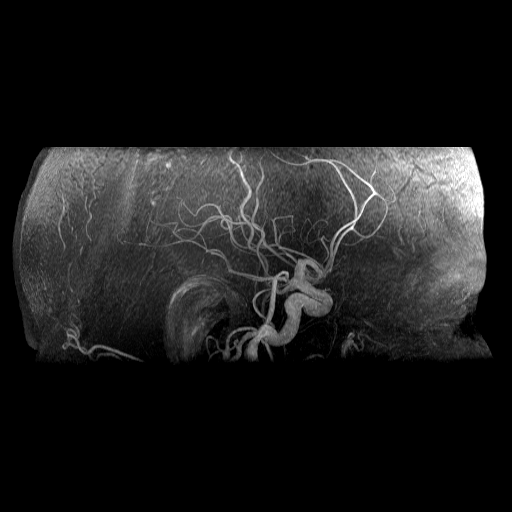

[20 of 48 positions shown; findings below may reference images not displayed]

FINDINGS: Anterior circulation: Patent. No significant stenosis, proximal
occlusion, aneurysm, or vascular malformation.

Posterior circulation: Patent. Fetal origin of the right PCA. No
significant stenosis, proximal occlusion, aneurysm, or vascular
malformation.

Venous sinuses: No evidence of thrombosis.

Anatomic variants: Please see above.
IMPRESSION: No large vessel occlusion, high-grade narrowing, dissection or
aneurysm.

## 2020-10-31 MED ORDER — LAMOTRIGINE 150 MG PO TABS
300.0000 mg | ORAL_TABLET | Freq: Every day | ORAL | Status: DC
  Administered 2020-11-01 – 2020-11-02 (×2): 300 mg via ORAL
  Filled 2020-10-31 (×2): qty 2

## 2020-10-31 MED ORDER — ALPRAZOLAM 0.5 MG PO TABS
1.0000 mg | ORAL_TABLET | Freq: Two times a day (BID) | ORAL | Status: DC | PRN
  Administered 2020-11-02: 1 mg via ORAL
  Filled 2020-10-31: qty 4
  Filled 2020-10-31: qty 2

## 2020-10-31 MED ORDER — AMLODIPINE BESYLATE 5 MG PO TABS
5.0000 mg | ORAL_TABLET | Freq: Every day | ORAL | Status: DC
  Administered 2020-10-31: 5 mg via ORAL
  Filled 2020-10-31: qty 1

## 2020-10-31 MED ORDER — ADULT MULTIVITAMIN W/MINERALS CH
1.0000 | ORAL_TABLET | Freq: Every day | ORAL | Status: DC
  Administered 2020-11-02: 1 via ORAL
  Filled 2020-10-31 (×2): qty 1

## 2020-10-31 MED ORDER — SERTRALINE HCL 100 MG PO TABS
100.0000 mg | ORAL_TABLET | Freq: Two times a day (BID) | ORAL | Status: DC
  Administered 2020-10-31 – 2020-11-02 (×4): 100 mg via ORAL
  Filled 2020-10-31 (×4): qty 1

## 2020-10-31 MED ORDER — VALBENAZINE TOSYLATE 40 MG PO CAPS: 40.0000 mg | ORAL_CAPSULE | Freq: Every day | ORAL | Status: DC

## 2020-10-31 MED ORDER — FERROUS SULFATE 325 (65 FE) MG PO TABS
325.0000 mg | ORAL_TABLET | Freq: Every day | ORAL | Status: DC
  Administered 2020-10-31 – 2020-11-01 (×2): 325 mg via ORAL
  Filled 2020-10-31 (×2): qty 1

## 2020-10-31 MED ORDER — ENOXAPARIN SODIUM 40 MG/0.4ML ~~LOC~~ SOLN
40.0000 mg | SUBCUTANEOUS | Status: DC
  Administered 2020-10-31 – 2020-11-01 (×2): 40 mg via SUBCUTANEOUS
  Filled 2020-10-31 (×2): qty 0.4

## 2020-10-31 MED ORDER — INSULIN ASPART 100 UNIT/ML ~~LOC~~ SOLN: 0.0000 [IU] | Freq: Every day | SUBCUTANEOUS | Status: DC

## 2020-10-31 MED ORDER — SIMVASTATIN 20 MG PO TABS
40.0000 mg | ORAL_TABLET | Freq: Every day | ORAL | Status: DC
  Administered 2020-10-31 – 2020-11-01 (×2): 40 mg via ORAL
  Filled 2020-10-31 (×3): qty 2

## 2020-10-31 MED ORDER — INSULIN ASPART 100 UNIT/ML ~~LOC~~ SOLN: 0.0000 [IU] | Freq: Three times a day (TID) | SUBCUTANEOUS | Status: DC

## 2020-10-31 MED ORDER — GADOBUTROL 1 MMOL/ML IV SOLN: 7.5000 mL | Freq: Once | INTRAVENOUS | Status: DC | PRN

## 2020-10-31 MED ORDER — GADOBUTROL 1 MMOL/ML IV SOLN
7.5000 mL | Freq: Once | INTRAVENOUS | Status: AC | PRN
  Administered 2020-10-31: 7.5 mL via INTRAVENOUS
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
## 2020-10-31 NOTE — ED Notes (Signed)
Pt ambulated ~172ft down the hallway and back with assistance of husband. Pt returned to bed.

## 2020-10-31 NOTE — ED Notes (Signed)
help get patient undress into a gown placed patient on the monitor did ekg shown to er doctor patient is resting with call bell in reach and family at bedside

## 2020-10-31 NOTE — Progress Notes (Signed)
Orthopedic Tech Progress Note Patient Details:  [name] [name] 07/19/1946 [id number]  Ortho Devices Type of Ortho Device: Sugartong splint Ortho Device/Splint Location: LUE Ortho Device/Splint Interventions: Ordered,Application   Post Interventions Patient Tolerated: Well Instructions Provided: Care of device   Janit Pagan 10/31/2020, 3:18 PM

## 2020-10-31 NOTE — ED Triage Notes (Signed)
Pt presents w/ c/o FFS this AM accruing 2 small posterior scalp lacerations.   Pt states she walking walker with her walker this AM when she suddenly became dizzy falling, striking her head. Denies Syncope, -ve blood thinner.

## 2020-10-31 NOTE — ED Notes (Signed)
Patient transported to MRI 

## 2020-10-31 NOTE — ED Notes (Signed)
Placed patient on the bedpan  

## 2020-10-31 NOTE — ED Provider Notes (Signed)
[facility] EMERGENCY DEPARTMENT Provider Note   CSN: [id number] Arrival date & time: 10/31/20  1017     History No chief complaint on file.   [name] [name] is a 74 y.o. female who presents via EMS after fall from standing at home this morning, with 2 small posterior scalp lacerations.  Patient states that she got up like normal and was feeling fine, went to her walker and began to walk and began to feel like the room was spinning. She states the next she knew she had fallen and was laying halfway under the table with walker on the floor.  She states that she laid there for approximately 25 minutes before her husband came down from upstairs (he is hard of hearing did not hear her calling for him).  Patient states she hit her head on the table, denies LOC, nausea, vomiting since that time.  She reports resolution of dizziness this time.  Endorses multiple similar episodes in the past.  States that on Friday she had a similar episode where she felt like the room was spinning and fell.  She states that the episodes began suddenly with sensation that the room is spinning and that she cannot tell up from down, that resolves immediately after she falls from standing.    She denies abdominal pain, nausea, vomiting, dysuria, hematuria, urinary frequency or urgency.  Denies chest pain, palpitations, shortness of breath.  She does endorse mild headache at this time, however feels like it is localized to areas where she has lacerations.  Denies blurry vision, double vision, photosensitivity, lightheadedness at this time.  Denies history of cardiac issues. She did break her right shoulder after 1 of these episodes/falls in the past.  I personally reviewed patient medical history she has history of hypertension, diabetes, hyperlipidemia, lymphedema, OSA, chronic renal disease stage III.  Collateral history obtained from patient's husband who is at the bedside.  He states the patient has  been cognitively intact and acting out like herself for the past several days.  He states she was "a little bit woozy" for a while after the fall this morning, however has not been confused and is acting like herself at this time.  HPI     Past Medical History:  Diagnosis Date  . Allergic rhinitis   . Bipolar disorder ([place])   . Cholelithiasis    seen on CAT 01/04/09  . Chronic renal disease, stage III    3/12  . Complication of anesthesia    versed and fentanyl did not work for colonoscopy  . Diabetic peripheral neuropathy ([place]) 07/18/2020  . DM (diabetes mellitus) ([place])   . Edema   . Gait abnormality 08/26/2019  . HLD (hyperlipidemia)   . HTN (hypertension)   . Iron deficiency anemia    3/12   . Lymphedema    tarda  . Neuropathy   . Obstructive sleep apnea on CPAP   . Osteoarthritis   . Renal cyst    2.7 cm seen on CAT 01/04/09  . Tardive dyskinesia     Patient Active Problem List   Diagnosis Date Noted  . Fall at home 10/31/2020  . Tardive dyskinesia 07/18/2020  . Diabetic peripheral neuropathy ([place]) 07/18/2020  . Gait abnormality 08/26/2019  . Unspecified gastritis and gastroduodenitis without mention of hemorrhage 07/09/2011  . Personal history of failed moderate sedation 05/20/2011  . ACUTE CYSTITIS 02/23/2008  . HYPERLIPIDEMIA 10/30/2007  . DIABETES MELLITUS, TYPE II 07/08/2007  . Iron deficiency anemia,  unspecified 07/08/2007  . DEPRESSION 07/08/2007  . HYPERTENSION 07/08/2007    Past Surgical History:  Procedure Laterality Date  . ABDOMINAL HYSTERECTOMY    . BALLOON DILATION N/A 04/24/2020   Procedure: BALLOON DILATION;  Surgeon: Ronnette Juniper, MD;  Location: [facility];  Service: Gastroenterology;  Laterality: N/A;  . BIOPSY  04/24/2020   Procedure: BIOPSY;  Surgeon: Ronnette Juniper, MD;  Location: [facility];  Service: Gastroenterology;;  . Lum Keas INJECTION N/A 04/24/2020   Procedure: POSSIBLE BOTOX INJECTION;  Surgeon: Ronnette Juniper, MD;  Location: [facility];   Service: Gastroenterology;  Laterality: N/A;  . COLONOSCOPY  2009; 07/09/11   2009:normal (polyp was not adenoma); 2012:   . DILATION AND CURETTAGE OF UTERUS    . ESOPHAGEAL MANOMETRY N/A 05/31/2020   Procedure: ESOPHAGEAL MANOMETRY (EM);  Surgeon: Ronnette Juniper, MD;  Location: [facility];  Service: Gastroenterology;  Laterality: N/A;  . ESOPHAGOGASTRODUODENOSCOPY  07/09/11   Erosive gastritis  . ESOPHAGOGASTRODUODENOSCOPY (EGD) WITH PROPOFOL N/A 04/24/2020   Procedure: ESOPHAGOGASTRODUODENOSCOPY (EGD) WITH PROPOFOL;  Surgeon: Ronnette Juniper, MD;  Location: [facility];  Service: Gastroenterology;  Laterality: N/A;  . FRACTURE SURGERY     rt fx upper arm  . GIVENS CAPSULE STUDY  07/25/2011   normal small bowel  . REIMPLANT URETER IN BLADDER  1996  . TRIGGER FINGER RELEASE Left 02/04/2013   Procedure: RELEASE TRIGGER FINGER/A-1 PULLEY LEFT RING FINGER;  Surgeon: Tennis Must, MD;  Location: [facility];  Service: Orthopedics;  Laterality: Left;     OB History   No obstetric history on file.     Family History  Problem Relation Age of Onset  . Diabetes Mother   . Hyperlipidemia Mother   . Heart disease Father   . Heart attack Father   . Colon cancer Neg Hx     Social History   Tobacco Use  . Smoking status: Former Smoker    Types: Cigarettes    Quit date: 02/02/1991    Years since quitting: 29.7  . Smokeless tobacco: Never Used  Substance Use Topics  . Alcohol use: Not Currently    Comment: less than 1 a day  . Drug use: No    Home Medications Prior to Admission medications   Medication Sig Start Date End Date Taking? Authorizing Provider  ALPRAZolam Duanne Moron) 1 MG tablet Take 1 mg by mouth 2 (two) times daily as needed for anxiety or sleep.   Yes [provider]  amLODipine (NORVASC) 5 MG tablet Take 5 mg by mouth at bedtime.    Yes [provider]  ferrous sulfate 325 (65 FE) MG tablet Take 325 mg by mouth at bedtime.   Yes [provider]  lamoTRIgine (LAMICTAL) 200 MG tablet Take 300 mg by mouth daily.   Yes [provider]  metFORMIN (GLUCOPHAGE) 1000 MG tablet Take 1,000 mg by mouth 2 (two) times daily with a meal.   Yes [provider]  Multiple Vitamins-Minerals (MULTIVITAMIN WITH MINERALS) tablet Take 1 tablet by mouth daily.   Yes [provider]  sertraline (ZOLOFT) 100 MG tablet Take 100 mg by mouth 2 (two) times daily.   Yes [provider]  simvastatin (ZOCOR) 40 MG tablet Take 40 mg by mouth at bedtime.    Yes [provider]  Valbenazine Tosylate (INGREZZA) 40 MG CAPS Take 40 mg by mouth at bedtime. 01/05/20  Yes Kathrynn Ducking, MD    Allergies    Latuda [lurasidone], Abilify [aripiprazole], Neosporin plus  max st, and Seroquel [quetiapine]  Review of Systems   Review of Systems  Constitutional: Negative for activity change, appetite change, chills, diaphoresis, fatigue and fever.  HENT: Negative.   Eyes: Negative.  Negative for photophobia and visual disturbance.  Respiratory: Negative for chest tightness, shortness of breath and wheezing.   Cardiovascular: Negative for chest pain, palpitations and leg swelling.  Gastrointestinal: Negative for abdominal pain, diarrhea, nausea and vomiting.  Genitourinary: Negative.  Negative for dysuria, frequency, hematuria and urgency.  Musculoskeletal: Negative.   Skin: Negative.   Allergic/Immunologic: Negative.   Neurological: Positive for dizziness and headaches. Negative for syncope, speech difficulty, weakness and light-headedness.  Hematological: Negative.   Psychiatric/Behavioral: Negative.     Physical Exam Updated Vital Signs BP 133/72   Pulse 64   Temp 98.6 F (37 C)   Resp 17   Ht 5\' 3"  (1.6 m)   Wt 72.6 kg   SpO2 93%   BMI 28.34 kg/m   Physical Exam Vitals and nursing note reviewed.  HENT:     Head:      Nose: Nose normal.     Mouth/Throat:     Mouth: Mucous membranes are moist.     Pharynx:  Oropharynx is clear. Uvula midline. No oropharyngeal exudate or posterior oropharyngeal erythema.  Eyes:     General:        Right eye: No discharge.        Left eye: No discharge.     Extraocular Movements: Extraocular movements intact.     Conjunctiva/sclera: Conjunctivae normal.     Pupils:     Right eye: Pupil is round and reactive.     Left eye: Pupil is round and reactive.     Visual Fields: Right eye visual fields normal and left eye visual fields normal.     Comments: Left pupil 2 mm, right pupil 4 mm.  Each are round and reactive to light.  Neck:     Trachea: Trachea and phonation normal.  Cardiovascular:     Rate and Rhythm: Normal rate and regular rhythm.     Pulses: Normal pulses.          Radial pulses are 2+ on the right side and 2+ on the left side.       Dorsalis pedis pulses are 2+ on the right side and 2+ on the left side.     Heart sounds: Normal heart sounds. No murmur heard.   Pulmonary:     Effort: Pulmonary effort is normal. No respiratory distress.     Breath sounds: Normal breath sounds. No wheezing or rales.  Chest:     Chest wall: No lacerations, deformity, swelling, tenderness, crepitus or edema.  Abdominal:     General: Bowel sounds are normal. There is no distension.     Palpations: Abdomen is soft.     Tenderness: There is no abdominal tenderness. There is no right CVA tenderness or left CVA tenderness.  Musculoskeletal:        General: No deformity.     Right shoulder: Normal.     Left shoulder: Normal.     Right upper arm: Normal.     Left upper arm: Tenderness present. No edema or deformity.     Right elbow: Normal.     Left elbow: Normal.     Right forearm: Normal.     Left forearm: Normal.     Right wrist: Normal.     Left wrist: Normal.     Right  hand: Normal.     Left hand: Normal.       Arms:     Cervical back: Normal and neck supple. No rigidity, tenderness, bony tenderness or crepitus. No pain with movement or spinous process  tenderness. Normal range of motion.     Thoracic back: No spasms, tenderness or bony tenderness.     Lumbar back: No spasms, tenderness or bony tenderness.     Right lower leg: Edema present.     Left lower leg: Edema present.     Comments: FROM of the shoulders, elbows, wrists bilaterally. Patient denies pain in left elbow with range of motion.   Lymphadenopathy:     Cervical: No cervical adenopathy.  Skin:    General: Skin is warm and dry.     Capillary Refill: Capillary refill takes less than 2 seconds.     Findings: Bruising present.     Comments: Small areas of bruising diffusely over the upper and lower extremities bilaterally, and varying stages of healing.  Neurological:     General: No focal deficit present.     Mental Status: She is alert and oriented to person, place, and time. Mental status is at baseline.     Cranial Nerves: Cranial nerves are intact.     Sensory: Sensation is intact.     Motor: Motor function is intact.     Coordination: Coordination is intact.     Gait: Gait is intact.  Psychiatric:        Mood and Affect: Mood normal.    ED Results / Procedures / Treatments   Labs (all labs ordered are listed, but only abnormal results are displayed) Labs Reviewed  BASIC METABOLIC PANEL - Abnormal; Notable for the following components:      Result Value   BUN 28 (*)    Creatinine, Ser 1.02 (*)    GFR, Estimated 58 (*)    All other components within normal limits  CBC WITH DIFFERENTIAL/PLATELET - Abnormal; Notable for the following components:   RBC 3.53 (*)    Hemoglobin 11.8 (*)    MCV 105.4 (*)    All other components within normal limits  URINALYSIS, ROUTINE W REFLEX MICROSCOPIC - Abnormal; Notable for the following components:   APPearance HAZY (*)    Leukocytes,Ua LARGE (*)    Bacteria, UA RARE (*)    Non Squamous Epithelial 0-5 (*)    All other components within normal limits  CBG MONITORING, ED    EKG EKG Interpretation  Date/Time:  Tuesday  October 31 2020 10:22:58 EST Ventricular Rate:  60 PR Interval:    QRS Duration: 137 QT Interval:  461 QTC Calculation: 461 R Axis:   -51 Text Interpretation: Sinus rhythm RBBB and LAFB Baseline wander in lead(s) V4 NSR, RBBB unchanged from previous Confirmed by Lavenia Atlas ([id number]) on 10/31/2020 10:40:20 AM   Radiology DG Elbow Complete Left  Result Date: 10/31/2020 CLINICAL DATA:  Pain following fall EXAM: LEFT ELBOW - COMPLETE 3+ VIEW COMPARISON:  None. FINDINGS: Frontal, lateral, and bilateral oblique views were obtained. There is a fracture of the proximal radial metaphysis with slight impaction in this area. No other acute fracture. No dislocation. There is bony overgrowth in the elbow joint region with generalized joint space narrowing. IMPRESSION: Fracture proximal radial metaphysis with impaction at fracture site. No other fracture. No dislocation. Extensive arthropathy and bony overgrowth within the elbow joint. Electronically Signed   By: Lowella Grip III M.D.   On: 10/31/2020 12:37  CT Head Wo Contrast  Result Date: 10/31/2020 CLINICAL DATA:  Head trauma, minor. Additional history provided: Fall striking head, laceration. EXAM: CT HEAD WITHOUT CONTRAST TECHNIQUE: Contiguous axial images were obtained from the base of the skull through the vertex without intravenous contrast. COMPARISON:  Prior head CT examinations 12/20/2019 and earlier. FINDINGS: Brain: There is no acute intracranial hemorrhage. No demarcated cortical infarct. No extra-axial fluid collection. No evidence of intracranial mass. No midline shift. Vascular: No hyperdense vessel.  Atherosclerotic calcifications. Skull: Normal. Negative for fracture or focal lesion. Sinuses/Orbits: Visualized orbits show no acute finding. Minimal ethmoid sinus mucosal thickening. Other: Posterior scalp soft tissue swelling. IMPRESSION: No evidence of acute intracranial abnormality. Posterior scalp soft tissue swelling. Minimal  ethmoid sinus mucosal thickening. Electronically Signed   By: Kellie Simmering DO   On: 10/31/2020 11:59   DG Humerus Left  Result Date: 10/31/2020 CLINICAL DATA:  Pain following fall EXAM: LEFT HUMERUS - 2+ VIEW COMPARISON:  Left elbow radiographs October 31, 2020 FINDINGS: Frontal and lateral views obtained. Fracture of the proximal radial metaphysis described in the elbow report. No humeral fracture or dislocation evident. There is bony overgrowth in the elbow joint region. There is mild narrowing of the glenohumeral joint. IMPRESSION: Fracture proximal radial metaphysis, described in the elbow report. No other fracture. No dislocation. Arthropathy in the elbow joint and to a lesser extent in the glenohumeral joint. Electronically Signed   By: Lowella Grip III M.D.   On: 10/31/2020 12:41    Procedures .Marland KitchenLaceration Repair  Date/Time: 10/31/2020 3:25 PM Performed by: Emeline Darling, PA-C Authorized by: Emeline Darling, PA-C   Consent:    Consent obtained:  Verbal   Consent given by:  Patient   Risks, benefits, and alternatives were discussed: yes     Risks discussed:  Infection, need for additional repair, pain, poor cosmetic result and poor wound healing   Alternatives discussed:  No treatment and delayed treatment Universal protocol:    Procedure explained and questions answered to patient or proxy's satisfaction: yes     Relevant documents present and verified: yes     Test results available: yes     Imaging studies available: yes     Required blood products, implants, devices, and special equipment available: yes     Site/side marked: yes     Immediately prior to procedure, a time out was called: yes     Patient identity confirmed:  Verbally with patient Anesthesia:    Anesthesia method:  None Laceration details:    Location:  Scalp   Scalp location:  L parietal   Length (cm):  0.8 Pre-procedure details:    Preparation:  Patient was prepped and draped in usual  sterile fashion and imaging obtained to evaluate for foreign bodies Exploration:    Limited defect created (wound extended): no     Hemostasis achieved with:  Direct pressure   Imaging outcome: foreign body not noted     Wound extent: no foreign bodies/material noted, no tendon damage noted and no underlying fracture noted     Contaminated: no   Treatment:    Area cleansed with:  Saline   Amount of cleaning:  Standard   Irrigation solution:  Sterile saline   Irrigation volume:  250   Debridement:  None   Undermining:  None Skin repair:    Repair method:  Staples   Number of staples:  1 Approximation:    Approximation:  Close Repair type:    Repair type:  Simple Post-procedure details:    Dressing:  Antibiotic ointment   Procedure completion:  Tolerated well, no immediate complications   (including critical care time)  Medications Ordered in ED Medications - No data to display  ED Course  I have reviewed the triage vital signs and the nursing notes.  Pertinent labs & imaging results that were available during my care of the patient were reviewed by me and considered in my medical decision making (see chart for details).    MDM Rules/Calculators/A&P                         74 year old female who presents with concern for dizziness and fall this morning, with history of multiple episodes of dizziness similar to this in the past.  Patient is not on anticoagulation.  Dizziness resolved immediately following the patient's fall at home.  Differential diagnosis for this patient symptoms include but are not limited to BPPV, orthostatic hypotension, Valsalva, TIA, arrhythmia, ACS, posterior stroke, Mnire's disease, vestibular neuritis, labrynthitis, intracranial abnormality such as mass, CVA.   Hypertensive on intake to 150/81.  Vital signs otherwise normal.  Cardiopulmonary exam is unremarkable, abdominal exam is benign.  Patient is neurologically intact as well.  2 small  lacerations to the left posterior scalp, tenderness palpation over the lateral left elbow and distal humerus.  Will obtain laboratory studies, EKG, CT head, and x-rays of the left elbow.  Analgesia offered, patient declined at this time.  CBC with mild anemia 11.8, previously 14.7 one year ago.  CBG 93.  BMP at patient baseline.    UA with large leukocytes, rare bacteria, non-squamous cells 0-5, not convincing for infection.  EKG with normal sinus rhythm, right bundle branch block unchanged from prior.  CT head negative for acute intracranial abnormality.  Mild posterior scalp soft tissue swelling.  Laceration repaired as above. Patient should have staple removed in 7 -10 days.   X-ray of the left elbow showed fracture of the proximal radial metaphysis with impaction at the fracture site.  Extensive arthropathy and bony overgrowth within the elbow joint. X-ray of the left humerus was negative for acute humeral abnormality.  Consult placed to orthopedic PA, Hilbert Odor, PA-C, who recommended sugar tong splint of the left arm and follow up with Dr. Doran Durand in the office.  I appreciate his collaboration of the care of this patient.  Splint in place, patient is walker dependent at baseline.  Patient was ambulated by this provider and a nurse, with the assistance of her husband.  Patient stated she did not feel comfortable ambulating without her walker, and my concern as her medical providers is that this patient could fall and pull her husband down with her, compromising safety of two elderly people.    Case discussed with attending physician.  Given abnormal pupillary exam and atypical episodes of dizziness discussed case with neurologist, who recommended MRI, and MRA of the head and neck due to concern for possible basilar insufficiency.  Recommend admission of this patient for completion of neurologic work-up; additionally recommend social work consult while inpatient for resources  regarding ambulation in the home, while she cannot use her walker.  Baker Janus voiced understanding of her medical evaluation and treatment plan.  Each of her questions were answered to her expressed satisfaction.  She is amenable to plan for admission to the hospital at this time.  Consult placed to hospitalist Dr. Cyndia Skeeters, who is agreeable to seeing this patient and admitting her to  his service.  I appreciate his collaboration of care this patient.   Final Clinical Impression(s) / ED Diagnoses Final diagnoses:  Dizziness    Rx / DC Orders ED Discharge Orders    None       Emeline Darling, PA-C 10/31/20 1827    Lorelle Gibbs, DO 11/06/20 1607

## 2020-11-01 ENCOUNTER — Observation Stay (HOSPITAL_BASED_OUTPATIENT_CLINIC_OR_DEPARTMENT_OTHER): Payer: Medicare Other

## 2020-11-01 ENCOUNTER — Encounter (HOSPITAL_COMMUNITY): Payer: Self-pay | Admitting: Student

## 2020-11-01 ENCOUNTER — Other Ambulatory Visit: Payer: Self-pay

## 2020-11-01 DIAGNOSIS — R55 Syncope and collapse: Secondary | ICD-10-CM | POA: Diagnosis not present

## 2020-11-01 DIAGNOSIS — Y92009 Unspecified place in unspecified non-institutional (private) residence as the place of occurrence of the external cause: Secondary | ICD-10-CM | POA: Diagnosis not present

## 2020-11-01 DIAGNOSIS — I10 Essential (primary) hypertension: Secondary | ICD-10-CM | POA: Diagnosis not present

## 2020-11-01 DIAGNOSIS — W19XXXA Unspecified fall, initial encounter: Secondary | ICD-10-CM

## 2020-11-01 DIAGNOSIS — S52122A Displaced fracture of head of left radius, initial encounter for closed fracture: Secondary | ICD-10-CM | POA: Diagnosis not present

## 2020-11-01 LAB — ECHOCARDIOGRAM COMPLETE
Area-P 1/2: 2.22 cm2
Height: 63 in
S' Lateral: 2.8 cm
Weight: 2666.68 oz

## 2020-11-01 LAB — LIPID PANEL
Cholesterol: 190 mg/dL (ref 0–200)
HDL: 86 mg/dL (ref >40)
LDL Cholesterol: 83 mg/dL (ref 0–99)
Total CHOL/HDL Ratio: 2.2 RATIO
Triglycerides: 104 mg/dL (ref <150)
VLDL: 21 mg/dL (ref 0–40)

## 2020-11-01 LAB — BASIC METABOLIC PANEL
Anion gap: 9 (ref 5–15)
BUN: 23 mg/dL (ref 8–23)
CO2: 24 mmol/L (ref 22–32)
Calcium: 8.9 mg/dL (ref 8.9–10.3)
Chloride: 106 mmol/L (ref 98–111)
Creatinine, Ser: 1.18 mg/dL — ABNORMAL HIGH (ref 0.44–1.00)
GFR, Estimated: 48 mL/min — ABNORMAL LOW (ref >60)
Glucose, Bld: 92 mg/dL (ref 70–99)
Potassium: 3.6 mmol/L (ref 3.5–5.1)
Sodium: 139 mmol/L (ref 135–145)

## 2020-11-01 LAB — RESP PANEL BY RT-PCR (FLU A&B, COVID) ARPGX2
Influenza A by PCR: NEGATIVE
Influenza B by PCR: NEGATIVE
SARS Coronavirus 2 by RT PCR: NEGATIVE

## 2020-11-01 LAB — CBC
HCT: 33.1 % — ABNORMAL LOW (ref 36.0–46.0)
Hemoglobin: 11.7 g/dL — ABNORMAL LOW (ref 12.0–15.0)
MCH: 35.1 pg — ABNORMAL HIGH (ref 26.0–34.0)
MCHC: 35.3 g/dL (ref 30.0–36.0)
MCV: 99.4 fL (ref 80.0–100.0)
Platelets: 153 10*3/uL (ref 150–400)
RBC: 3.33 MIL/uL — ABNORMAL LOW (ref 3.87–5.11)
RDW: 12.4 % (ref 11.5–15.5)
WBC: 5.4 10*3/uL (ref 4.0–10.5)
nRBC: 0 % (ref 0.0–0.2)

## 2020-11-01 MED ORDER — VALBENAZINE TOSYLATE 40 MG PO CAPS
40.0000 mg | ORAL_CAPSULE | Freq: Every day | ORAL | Status: DC
  Filled 2020-11-01 (×2): qty 1

## 2020-11-01 MED ORDER — ACETAMINOPHEN 325 MG PO TABS
650.0000 mg | ORAL_TABLET | Freq: Four times a day (QID) | ORAL | Status: DC | PRN
  Administered 2020-11-01 – 2020-11-02 (×3): 650 mg via ORAL
  Filled 2020-11-01 (×3): qty 2

## 2020-11-01 NOTE — ED Notes (Signed)
Report given to [employer], Therapist, sports.

## 2020-11-01 NOTE — Care Management Obs Status (Signed)
[facility] NOTIFICATION   Patient Details  Name: [name] [name] MRN: [id number] Date of Birth: 10/08/1946   Medicare Observation Status Notification Given:  Yes    Marilu Favre, RN 11/01/2020, 10:26 AM

## 2020-11-01 NOTE — Progress Notes (Signed)
Progress Note    [name] [name]  MGQ:[id number] DOB: Sep 06, 1946  DOA: 10/31/2020 PCP: [name], MD    Brief Narrative:    Medical records reviewed and are as summarized below:  [name] [name] is an 74 y.o. female with history of CKD-3A, HTN, bipolar disorder, depression, OSA on CPAP, tardive dyskinesia and osteoarthritis brought to ED after she had an episode of "disorientation and fall. Patient was in her usual state of health when she got up this morning about 8 AM. She was walking to the kitchen when she suddenly felt disoriented and could not tell where things were and fell. She hit her head against the edge of the table. She denies loss of consciousness. She said the incident was brief and lasted seconds.  Assessment/Plan:   Active Problems:   Fall at home  Fall at home/spatial disorientation: unclear etiology of this. Per patient's description, it is a brief episode that lasted second which favors syncope. Not consistent with seizure. She is also on Ingrezza and Xanax which could contribute but she had similar episodes since the age of 77.  -MRI/MRA unrevealing -TTE pending -PT/OT eval pending  Left radial fracture:  -splint -outpatient follow-up with Ortho -PT/OT as above  Diabetic neuropathy? Patient denies history of diabetes but says her doctor put her on Metformin because of neuropathy. A1c 4.9% -d/c metformin  CKD-3A: Stable -Continue monitoring.  Depression/bipolar disorder: Stable -Continue home medications--Lamictal, Zoloft and Xanax.  OSA on CPAP: Compliant. -Continue nightly CPAP  Essential hypertension: Normotensive for most part. 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 -TTE  pending    Family Communication/Anticipated D/C date and plan/Code Status   DVT prophylaxis: Lovenox ordered. Code Status: DNR  Family Communication: husband  at bedside Disposition Plan: Status is: Observation  The patient remains OBS appropriate and will d/c before 2 midnights.  Dispo: The patient is from: Home              Anticipated d/c is to: Home              Anticipated d/c date is: 1 day              Patient currently is not medically stable to d/c. echo pending         Medical Consultants:    Ortho/neuro (phone only)  Subjective:   Wondering why she feels no pain when she breaks a bone  Objective:    Vitals:   11/01/20 1330 11/01/20 1332 11/01/20 1334 11/01/20 1337  BP:      Pulse: 60 68 66 71  Resp:      Temp:      TempSrc:      SpO2:      Weight:      Height:        Intake/Output Summary (Last 24 hours) at 11/01/2020 1542 Last data filed at 11/01/2020 1230 Gross per 24 hour  Intake 480 ml  Output 300 ml  Net 180 ml   Filed Weights   10/31/20 1024 11/01/20 0200  Weight: 72.6 kg 75.6 kg    Exam:  General: Appearance:     Overweight female in no acute distress     Lungs:     respirations unlabored  Heart:    Normal heart rate. Normal rhythm. No murmurs, rubs, or gallops.   MS:   All extremities are intact.   Neurologic:  Awake, alert- difficult historian and poor insight into disease management and problem solving    Data Reviewed:   I have personally reviewed following labs and imaging studies:  Labs: Labs show the following:   Basic Metabolic Panel: Recent Labs  Lab 10/31/20 1107 11/01/20 0345  NA 140 139  K 4.0 3.6  CL 107 106  CO2 22 24  GLUCOSE 91 92  BUN 28* 23  CREATININE 1.02* 1.18*  CALCIUM 9.5 8.9   GFR Estimated Creatinine Clearance: 40.7 mL/min (A) (by C-G formula based on SCr of 1.18 mg/dL (H)). Liver Function Tests: No results for input(s): AST, ALT, ALKPHOS, BILITOT, PROT, ALBUMIN in the last 168 hours. No results for input(s): LIPASE, AMYLASE in the last 168 hours. No results for input(s): AMMONIA in the last 168 hours. Coagulation profile No results for  input(s): INR, PROTIME in the last 168 hours.  CBC: Recent Labs  Lab 10/31/20 1107 11/01/20 0345  WBC 5.4 5.4  NEUTROABS 4.0  --   HGB 11.8* 11.7*  HCT 37.2 33.1*  MCV 105.4* 99.4  PLT 151 153   Cardiac Enzymes: No results for input(s): CKTOTAL, CKMB, CKMBINDEX, TROPONINI in the last 168 hours. BNP (last 3 results) No results for input(s): PROBNP in the last 8760 hours. CBG: Recent Labs  Lab 10/31/20 1125  GLUCAP 93   D-Dimer: No results for input(s): DDIMER in the last 72 hours. Hgb A1c: Recent Labs    10/31/20 March 03, 2124  HGBA1C 4.9   Lipid Profile: Recent Labs    11/01/20 0345  CHOL 190  HDL 86  LDLCALC 83  TRIG 104  CHOLHDL 2.2   Thyroid function studies: Recent Labs    10/31/20 03/03/2124  TSH 2.298   Anemia work up: No results for input(s): VITAMINB12, FOLATE, FERRITIN, TIBC, IRON, RETICCTPCT in the last 72 hours. Sepsis Labs: Recent Labs  Lab 10/31/20 1107 11/01/20 0345  WBC 5.4 5.4    Microbiology Recent Results (from the past 240 hour(s))  Resp Panel by RT-PCR (Flu A&B, Covid) Nasopharyngeal Swab     Status: None   Collection Time: 10/31/20 11:43 PM   Specimen: Nasopharyngeal Swab; Nasopharyngeal(NP) swabs in vial transport medium  Result Value Ref Range Status   SARS Coronavirus 2 by RT PCR NEGATIVE NEGATIVE Final    Comment: (NOTE) SARS-CoV-2 target nucleic acids are NOT DETECTED.  The SARS-CoV-2 RNA is generally detectable in upper respiratory specimens during the acute phase of infection. The lowest concentration of SARS-CoV-2 viral copies this assay can detect is 138 copies/mL. A negative result does not preclude SARS-Cov-2 infection and should not be used as the sole basis for treatment or other patient management decisions. A negative result may occur with  improper specimen collection/handling, submission of specimen other than nasopharyngeal swab, presence of viral mutation(s) within the areas targeted by this assay, and inadequate  number of viral copies(<138 copies/mL). A negative result must be combined with clinical observations, patient history, and epidemiological information. The expected result is Negative.  Fact Sheet for Patients:  EntrepreneurPulse.com.au  Fact Sheet for Healthcare Providers:  IncredibleEmployment.be  This test is no t yet approved or cleared by the [place] FDA and  has been authorized for detection and/or diagnosis of SARS-CoV-2 by FDA under an Emergency Use Authorization (EUA). This EUA will remain  in effect (meaning this test can be used) for the duration of the COVID-19 declaration under Section 564(b)(1) of the Act, 21 U.S.C.section 360bbb-3(b)(1), unless the authorization is terminated  or revoked sooner.  Influenza A by PCR NEGATIVE NEGATIVE Final   Influenza B by PCR NEGATIVE NEGATIVE Final    Comment: (NOTE) The Xpert Xpress SARS-CoV-2/FLU/RSV plus assay is intended as an aid in the diagnosis of influenza from Nasopharyngeal swab specimens and should not be used as a sole basis for treatment. Nasal washings and aspirates are unacceptable for Xpert Xpress SARS-CoV-2/FLU/RSV testing.  Fact Sheet for Patients: EntrepreneurPulse.com.au  Fact Sheet for Healthcare Providers: IncredibleEmployment.be  This test is not yet approved or cleared by the [place] FDA and has been authorized for detection and/or diagnosis of SARS-CoV-2 by FDA under an Emergency Use Authorization (EUA). This EUA will remain in effect (meaning this test can be used) for the duration of the COVID-19 declaration under Section 564(b)(1) of the Act, 21 U.S.C. section 360bbb-3(b)(1), unless the authorization is terminated or revoked.  Performed at [facility], [place] [address], [place], [place] [address]     Procedures and diagnostic studies:  DG Elbow Complete Left  Result Date: 10/31/2020 CLINICAL  DATA:  Pain following fall EXAM: LEFT ELBOW - COMPLETE 3+ VIEW COMPARISON:  None. FINDINGS: Frontal, lateral, and bilateral oblique views were obtained. There is a fracture of the proximal radial metaphysis with slight impaction in this area. No other acute fracture. No dislocation. There is bony overgrowth in the elbow joint region with generalized joint space narrowing. IMPRESSION: Fracture proximal radial metaphysis with impaction at fracture site. No other fracture. No dislocation. Extensive arthropathy and bony overgrowth within the elbow joint. Electronically Signed   By: Lowella Grip III M.D.   On: 10/31/2020 12:37   CT Head Wo Contrast  Result Date: 10/31/2020 CLINICAL DATA:  Head trauma, minor. Additional history provided: Fall striking head, laceration. EXAM: CT HEAD WITHOUT CONTRAST TECHNIQUE: Contiguous axial images were obtained from the base of the skull through the vertex without intravenous contrast. COMPARISON:  Prior head CT examinations 12/20/2019 and earlier. FINDINGS: Brain: There is no acute intracranial hemorrhage. No demarcated cortical infarct. No extra-axial fluid collection. No evidence of intracranial mass. No midline shift. Vascular: No hyperdense vessel.  Atherosclerotic calcifications. Skull: Normal. Negative for fracture or focal lesion. Sinuses/Orbits: Visualized orbits show no acute finding. Minimal ethmoid sinus mucosal thickening. Other: Posterior scalp soft tissue swelling. IMPRESSION: No evidence of acute intracranial abnormality. Posterior scalp soft tissue swelling. Minimal ethmoid sinus mucosal thickening. Electronically Signed   By: Kellie Simmering DO   On: 10/31/2020 11:59   MR ANGIO HEAD WO CONTRAST  Result Date: 10/31/2020 CLINICAL DATA:  Dizziness, non-specific EXAM: MRA HEAD WITHOUT CONTRAST TECHNIQUE: Angiographic images of the Circle of Willis were obtained using MRA technique without intravenous contrast. COMPARISON:  10/31/2020 head CT and prior  FINDINGS: Anterior circulation: Patent. No significant stenosis, proximal occlusion, aneurysm, or vascular malformation. Posterior circulation: Patent. Fetal origin of the right PCA. No significant stenosis, proximal occlusion, aneurysm, or vascular malformation. Venous sinuses: No evidence of thrombosis. Anatomic variants: Please see above. IMPRESSION: No large vessel occlusion, high-grade narrowing, dissection or aneurysm. Electronically Signed   By: Primitivo Gauze M.D.   On: 10/31/2020 20:58   MR Angiogram Neck W or Wo Contrast  Result Date: 10/31/2020 CLINICAL DATA:  dizziness, neurology recommended EXAM: MRA NECK WITHOUT AND WITH CONTRAST TECHNIQUE: Multiplanar and multiecho pulse sequences of the neck were obtained without and with intravenous contrast. Angiographic images of the neck were obtained using MRA technique without and with intravenous contrast. CONTRAST:  7.31mL GADAVIST GADOBUTROL 1 MMOL/ML IV SOLN COMPARISON:  Concurrent MRA head.  FINDINGS: Three-vessel aortic arch.  Patent great vessel origins. Bilateral CCA is are patent. Right carotid bifurcation atheromatous disease with approximately 40% luminal narrowing of the proximal ICA. Mild irregularity of the mid right ICA without significant narrowing likely reflects atheromatous disease. Left bifurcation atheromatous disease with 20 is 30% luminal narrowing of the proximal ICA. Patent left vertebral artery. Patent right vertebral artery with mild narrowing proximally. IMPRESSION: 40% narrowing of the proximal right ICA. 20-30% proximal left ICA narrowing. Mild proximal right vertebral artery narrowing. Electronically Signed   By: Primitivo Gauze M.D.   On: 10/31/2020 21:40   MR Brain W and Wo Contrast  Result Date: 10/31/2020 CLINICAL DATA:  dizziness EXAM: MRI HEAD WITHOUT AND WITH CONTRAST TECHNIQUE: Multiplanar, multiecho pulse sequences of the brain and surrounding structures were obtained without and with intravenous  contrast. CONTRAST:  7.48mL GADAVIST GADOBUTROL 1 MMOL/ML IV SOLN COMPARISON:  10/31/2020 and prior. FINDINGS: Brain: No diffusion-weighted signal abnormality. Remote right periatrial white matter microhemorrhage. No midline shift, ventriculomegaly or extra-axial fluid collection. No mass lesion. No abnormal enhancement. Mild cerebral atrophy with ex vacuo dilatation. Chronic left basal ganglia lacunar insult. Mild chronic microvascular ischemic changes. Vascular: Normal flow voids. Skull and upper cervical spine: Normal marrow signal. Sinuses/Orbits: Sequela of bilateral lens replacement. Mild ethmoid sinus mucosal thickening. Trace bilateral mastoid effusion. Other: Left parietal scalp swelling and laceration. IMPRESSION: No acute intracranial process. Chronic left basal ganglia lacunar insult. Mild cerebral atrophy and chronic microvascular ischemic changes. Left parietal scalp laceration and swelling. Electronically Signed   By: Primitivo Gauze M.D.   On: 10/31/2020 21:42   DG Humerus Left  Result Date: 10/31/2020 CLINICAL DATA:  Pain following fall EXAM: LEFT HUMERUS - 2+ VIEW COMPARISON:  Left elbow radiographs October 31, 2020 FINDINGS: Frontal and lateral views obtained. Fracture of the proximal radial metaphysis described in the elbow report. No humeral fracture or dislocation evident. There is bony overgrowth in the elbow joint region. There is mild narrowing of the glenohumeral joint. IMPRESSION: Fracture proximal radial metaphysis, described in the elbow report. No other fracture. No dislocation. Arthropathy in the elbow joint and to a lesser extent in the glenohumeral joint. Electronically Signed   By: Lowella Grip III M.D.   On: 10/31/2020 12:41    Medications:    enoxaparin (LOVENOX) injection  40 mg Subcutaneous Q24H   ferrous sulfate  325 mg Oral QHS   lamoTRIgine  300 mg Oral Daily   multivitamin with minerals  1 tablet Oral Daily   sertraline  100 mg Oral BID    simvastatin  40 mg Oral QHS   Valbenazine Tosylate  40 mg Oral QHS   Continuous Infusions:   LOS: 0 days   Geradine Girt  [facility]   How to contact the [facility] Attending or Consulting provider [place] or covering provider during after hours [place], for this patient?  1. Check the care team in [facility] and look for a) attending/consulting TRH provider listed and b) the [facility] team listed 2. Log into [email or web address] and use [place]'s universal password to access. If you do not have the password, please contact the hospital operator. 3. Locate the [facility] provider you are looking for under [facility] and page to a number that you can be directly reached. 4. If you still have difficulty reaching the provider, please page the [facility] (Director on Call) for the Hospitalists listed on amion for assistance.  11/01/2020, 3:42 PM

## 2020-11-01 NOTE — TOC Initial Note (Addendum)
Transition of Care [facility]) - Initial/Assessment Note    Patient Details  Name: [name] [name] MRN: [id number] Date of Birth: 01/18/1946  Transition of Care [facility] CM/SW Contact:    Marilu Favre, RN Phone Number: 11/01/2020, 10:31 AM  Clinical Narrative:                    Patient from home with husband. Uses [facility] . PCP is Dr Felipa Eth, will await PT eval. Medicare observation letter given    Confirmed face sheet information. Ordered left platform walker with Freda Munro with [facility]., Patient has no preference for home health. Tommi Rumps with [facility] accepted referral.  Patient Goals and CMS Choice Patient states their goals for this hospitalization and ongoing recovery are:: to return to home CMS Medicare.gov Compare Post Acute Care list provided to:: Patient    Expected Discharge Plan and Services     Discharge Planning Services: CM Consult   Living arrangements for the past 2 months: Single Family Home                 DME Arranged: N/A         HH Arranged: NA          Prior Living Arrangements/Services Living arrangements for the past 2 months: Single Family Home Lives with:: Spouse Patient language and need for interpreter reviewed:: Yes        Need for Family Participation in Patient Care: Yes (Comment) Care giver support system in place?: Yes (comment)   Criminal Activity/Legal Involvement Pertinent to Current Situation/Hospitalization: No - Comment as needed  Activities of Daily Living Home Assistive Devices/Equipment: None ADL Screening (condition at time of admission) Patient's cognitive ability adequate to safely complete daily activities?: Yes Is the patient deaf or have difficulty hearing?: No Does the patient have difficulty seeing, even when wearing glasses/contacts?: No Does the patient have difficulty concentrating, remembering, or making decisions?: No Patient able to express need for assistance with ADLs?: Yes Does the patient have  difficulty dressing or bathing?: No Independently performs ADLs?: Yes (appropriate for developmental age) Does the patient have difficulty walking or climbing stairs?: Yes Weakness of Legs: Both Weakness of Arms/Hands: Left  Permission Sought/Granted   Permission granted to share information with : No              Emotional Assessment Appearance:: Appears stated age Attitude/Demeanor/Rapport: Engaged Affect (typically observed): Accepting Orientation: : Oriented to Self,Oriented to Place,Oriented to  Time,Oriented to Situation Alcohol / Substance Use: Not Applicable Psych Involvement: No (comment)  Admission diagnosis:  Dizziness [R42] Fall at home [W19.Merril Abbe, E26.834] Patient Active Problem List   Diagnosis Date Noted  . Fall at home 10/31/2020  . Tardive dyskinesia 07/18/2020  . Diabetic peripheral neuropathy ([place]) 07/18/2020  . Gait abnormality 08/26/2019  . Unspecified gastritis and gastroduodenitis without mention of hemorrhage 07/09/2011  . Personal history of failed moderate sedation 05/20/2011  . ACUTE CYSTITIS 02/23/2008  . HYPERLIPIDEMIA 10/30/2007  . DIABETES MELLITUS, TYPE II 07/08/2007  . Iron deficiency anemia, unspecified 07/08/2007  . DEPRESSION 07/08/2007  . HYPERTENSION 07/08/2007   PCP:  [name], MD Pharmacy:   [facility] [address], [place] [address] Phone: [phone] Fax: [phone]     Social Determinants of Health (SDOH) Interventions    Readmission Risk Interventions No flowsheet data found.

## 2020-11-01 NOTE — Progress Notes (Signed)
  Echocardiogram 2D Echocardiogram has been performed.  [name] [name] 11/01/2020, 4:01 PM

## 2020-11-01 NOTE — Progress Notes (Signed)
ORTHOSTATIC VS     11/01/20 1330 11/01/20 1332 11/01/20 1334  Orthostatic Lying   BP- Lying 144/81  --   --   Pulse- Lying 60  --   --   Orthostatic Sitting  BP- Sitting  --  (!) 148/105  --   Pulse- Sitting  --  68  --   Orthostatic Standing at 0 minutes  BP- Standing at 0 minutes  --   --  (!) 156/99  Pulse- Standing at 0 minutes  --   --  66  Orthostatic Standing at 3 minutes  BP- Standing at 3 minutes  --   --   --   Pulse- Standing at 3 minutes  --   --   --     11/01/20 1337  Orthostatic Lying   BP- Lying  --   Pulse- Lying  --   Orthostatic Sitting  BP- Sitting  --   Pulse- Sitting  --   Orthostatic Standing at 0 minutes  BP- Standing at 0 minutes  --   Pulse- Standing at 0 minutes  --   Orthostatic Standing at 3 minutes  BP- Standing at 3 minutes 161/85  Pulse- Standing at 3 minutes 71

## 2020-11-01 NOTE — Evaluation (Signed)
Occupational Therapy Evaluation Patient Details Name: [name] [name] MRN: [id number] DOB: 10/17/1946 Today's Date: 11/01/2020    History of Present Illness [name] [name] is a 74 y.o. female with history of CKD-3A, HTN, bipolar disorder, depression, OSA on CPAP, tardive dyskinesia and osteoarthritis brought to ED after she had an episode of "disorientation and fall"; Pt with resultant L radial fx now in sugar tong splint and NWB through L hand, ok to WB through elbow per ortho trauma PA.   Clinical Impression   Pt from home, typically mod I with RW and shower chair (3 in 1). Today Pt is min guard with transfers with good use of RW with L platform. INitially pt stating she was unable to access to LB for dressing/bathing and educated/demonstrated figure 4 method, and Pt able to complete LB dressing. Spent significant time educating Pt on smarter clothing choices to make ADL easier, sequencing, and compensatory strategies for UB ADL with LUE immobilized in splint. Pt is right handed. 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 At this time recommend continued OT acutely, and afterwards would recommend HHOT, in addition to short term aide. Pt Also asking about life alert programs. If patient remains in hospital, OT will plan on reinforcing compensatory strategies for access to LB as well as BUE tasks in standing at sink - compensatory strategies and safety.     Follow Up Recommendations  Home health OT;Other (comment) ([place] aide)    Equipment Recommendations  Other (comment) (L Platform RW)    Recommendations for Other Services       Precautions / Restrictions Precautions Precautions: Fall Precaution Comments: h/o falls without warning and with fx Required Braces or Orthoses: Splint/Cast Splint/Cast: LUE sugar tong splint Restrictions Weight Bearing Restrictions: Yes LUE Weight Bearing: Non weight bearing      Mobility Bed  Mobility Overal bed mobility: Needs Assistance Bed Mobility: Sit to Supine       Sit to supine: Supervision   General bed mobility comments: no attempt to WB through LUE    Transfers Overall transfer level: Needs assistance Equipment used: Left platform walker Transfers: Sit to/from Stand Sit to Stand: Min guard         General transfer comment: good hand placement, no attempt to push through LUE    Balance Overall balance assessment: Mild deficits observed, not formally tested Sitting-balance support: Feet supported;No upper extremity supported Sitting balance-Leahy Scale: Good     Standing balance support: Bilateral upper extremity supported;Single extremity supported Standing balance-Leahy Scale: Poor Standing balance comment: does better with external support.  Held railing after toileting and therapist pulled her underwear up.                           ADL either performed or assessed with clinical judgement   ADL Overall ADL's : Needs assistance/impaired Eating/Feeding: Set up;Sitting   Grooming: Min guard;Sitting;Wash/dry face;Brushing hair Grooming Details (indicate cue type and reason): Pt is right handed, and can use L hand as stabilizer for BUE tasks Upper Body Bathing: Min guard Upper Body Bathing Details (indicate cue type and reason): educated on how to keep LUE dry Lower Body Bathing: Set up;Sitting/lateral leans Lower Body Bathing Details (indicate cue type and reason): able to perform figure 4 technique Upper Body Dressing : Minimal assistance;Cueing for UE precautions;Cueing for sequencing Upper Body Dressing Details (indicate cue type and reason): she  is concerned about her husband assisting her because of his lack of awareness Lower Body Dressing: Moderate assistance;Sit to/from stand Lower Body Dressing Details (indicate cue type and reason): Educated Pt on options for dressing to make it easier on her functionally (shirts that button up  the front, or tank tops/big tshirts with a shawl), sequence for dressing, also educated in figure 4 to access feet for LB dressing (socks/shoes/underwear) Toilet Transfer: Min guard;Ambulation;RW (L platform walker)   Toileting- Clothing Manipulation and Hygiene: Moderate assistance;Sit to/from stand Toileting - Clothing Manipulation Details (indicate cue type and reason): to manage underwear - educated on smart options for home     Functional mobility during ADLs: Min guard;Rolling walker (L platform walker) General ADL Comments: Pt also asking about life alert options, she reports that she has fallen and her husband has not heard her, she has laid on  the floor for 8 hours and not been able to get up     Vision Baseline Vision/History: Wears glasses Wears Glasses: At all times Patient Visual Report: No change from baseline Vision Assessment?: No apparent visual deficits     Perception     Praxis      Pertinent Vitals/Pain Pain Assessment: No/denies pain     Hand Dominance Right   Extremity/Trunk Assessment Upper Extremity Assessment Upper Extremity Assessment: LUE deficits/detail LUE Deficits / Details: sugar tong splint, and ace bandage LUE: Unable to fully assess due to immobilization LUE Coordination: decreased fine motor;decreased gross motor   Lower Extremity Assessment Lower Extremity Assessment: Generalized weakness (history of neuropathy, BLE edema noted - Pt reports lymphodema)   Cervical / Trunk Assessment Cervical / Trunk Assessment: Normal   Communication Communication Communication: No difficulties   Cognition Arousal/Alertness: Awake/alert Behavior During Therapy: WFL for tasks assessed/performed Overall Cognitive Status: No family/caregiver present to determine baseline cognitive functioning Area of Impairment: Awareness;Problem solving                           Awareness: Emergent Problem Solving: Difficulty sequencing;Requires verbal  cues;Requires tactile cues General Comments: Pt was open to basic solutions to functional deficits due to RUE, but could not generate solutions herself   General Comments  Encouraged Pt to get friends to bring meals for a little while since she is primary cook    Exercises     Shoulder Instructions      Home Living Family/patient expects to be discharged to:: Private residence Living Arrangements: Spouse/significant other Available Help at Discharge: Family;Friend(s) (lives with husband who is unable to physically assist) Type of Home: House Home Access: Stairs to enter [employer] of Steps: 8 Entrance Stairs-Rails: Right;Left Home Layout: Two level;Able to live on main level with bedroom/bathroom     Bathroom Shower/Tub: Occupational psychologist: Standard     Home Equipment: Environmental consultant - 4 wheels;Bedside commode;Other (comment) (uses BSC as shower chair)          Prior Functioning/Environment Level of Independence: Independent with assistive device(s)  Gait / Transfers Assistance Needed: pt reports she normally ambulates with a 4 wheeled RW     Comments: uses RW        OT Problem List: Impaired balance (sitting and/or standing);Decreased knowledge of use of DME or AE;Impaired UE functional use      OT Treatment/Interventions: Self-care/ADL training;DME and/or AE instruction;Therapeutic activities;Patient/family education;Balance training    OT Goals(Current goals can be found in the care plan section) Acute Rehab OT Goals  Patient Stated Goal: To figure out what is going on OT Goal Formulation: With patient Time For Goal Achievement: 11/15/20 Potential to Achieve Goals: Good ADL Goals Pt Will Perform Grooming: with modified independence;standing Pt Will Perform Upper Body Dressing: with modified independence;sitting Pt Will Perform Lower Body Dressing: with set-up;sit to/from stand Pt Will Transfer to Toilet: with modified  independence;ambulating Pt Will Perform Toileting - Clothing Manipulation and hygiene: with modified independence;sit to/from stand  OT Frequency: Min 2X/week   Barriers to D/C:    Pt expressed concerns about husband being able to provide adequate support       Co-evaluation              AM-PAC OT "6 Clicks" Daily Activity     Outcome Measure Help from another person eating meals?: A Little Help from another person taking care of personal grooming?: A Little Help from another person toileting, which includes using toliet, bedpan, or urinal?: A Lot Help from another person bathing (including washing, rinsing, drying)?: A Lot Help from another person to put on and taking off regular upper body clothing?: A Lot Help from another person to put on and taking off regular lower body clothing?: A Lot 6 Click Score: 14   End of Session Equipment Utilized During Treatment: Gait belt;Rolling walker (L platform) Nurse Communication: Mobility status  Activity Tolerance: Patient tolerated treatment well Patient left: in bed;Other (comment) (goig to vascular)  OT Visit Diagnosis: Other abnormalities of gait and mobility (R26.89);History of falling (Z91.81);Pain Pain - Right/Left: Left Pain - part of body: Arm                Time: 1441-1529 OT Time Calculation (min): 48 min Charges:  OT General Charges $OT Visit: 1 Visit OT Evaluation $OT Eval Moderate Complexity: 1 Mod OT Treatments $Self Care/Home Management : 8-22 mins  Jesse Sans OTR/L Acute Rehabilitation Services Pager: [phone] Office: [place] 11/01/2020, 4:18 PM

## 2020-11-01 NOTE — Evaluation (Signed)
Physical Therapy Evaluation/Vestibular Assessment Patient Details Name: [name] [name] MRN: [id number] DOB: 05-25-1946 Today's Date: 11/01/2020   History of Present Illness  [name] [name] is a 74 y.o. female with history of CKD-3A, HTN, bipolar disorder, depression, OSA on CPAP, tardive dyskinesia and osteoarthritis brought to ED after she had an episode of "disorientation and fall"; Pt with resultant L radial fx now in sugar tong splint and NWB through L hand, ok to WB through elbow per ortho trauma PA.  MRI negative for stroke.  Clinical Impression  Pt has completely negative vestibular assessment.  See details below and RN tech took orthostatics and those were negative (see vitals flow sheet).  Pt did well with L platform RW for mobility and would benefit from follow up therapy at home.  She is a very interesting case and has several unusual medical issues.  I hope she gets some answers re: why she suddenly loses her sense of spatial awareness.   PT to follow acutely for deficits listed below.      Follow Up Recommendations Home health PT    Equipment Recommendations  Rolling walker with 5" wheels;Other (comment) (left platform RW)    Recommendations for Other Services       Precautions / Restrictions Precautions Precautions: Fall Precaution Comments: h/o falls without warning and with fx      Mobility  Bed Mobility Overal bed mobility: Modified Independent                  Transfers Overall transfer level: Needs assistance Equipment used: Left platform walker Transfers: Sit to/from Stand Sit to Stand: Min guard         General transfer comment: min guard assist for safety, cues for safe hand placement with transitions up and down to platform RW.  Pt stood multiple times from multiple surfaces.  Ambulation/Gait Ambulation/Gait assistance: Min guard Gait Distance (Feet): 150 Feet Assistive device: Left platform walker Gait Pattern/deviations: Step-through  pattern;Staggering left;Staggering right     General Gait Details: Pt with mildly staggering gait pattern, donned shoes for better stability than large socks.  I let pt steer Rw herself and was there for safety.  Stairs            Wheelchair Mobility    Modified Rankin (Stroke Patients Only)       Balance Overall balance assessment: Needs assistance Sitting-balance support: Feet supported;No upper extremity supported Sitting balance-Leahy Scale: Good     Standing balance support: Bilateral upper extremity supported;Single extremity supported Standing balance-Leahy Scale: Poor Standing balance comment: does better with external support.  Held railing after toileting and therapist pulled her underwear up.       11/01/20 1556  Vestibular Assessment  General Observation Pt reports no recent head trauma (although she did hit her head with this fall), no whiplash injury, no feeling of fullness in her ears, does have tinnitus, but has been there for a while and without progressive hearing loss, wears bifocals (transitions) and has had eyes checked in the past year, MRI was negative for stroke, reports her daughter was just dx with MS, no recent URI or change in her medications, does not feel like she is spinning, passing out, or blacking out, she just reports it as sudden disorientation to where she is in space.  Symptom Behavior  Subjective history of current problem Pt reports getting sudden, unpredictable disorientation to where she was in space, falls, and then it goes away immediately.  No further sensation of  spinning, but usually some kind of injury (this time it was a radial fx, before it was a R "shoulder" fx).  Type of Dizziness  Comment (loss of spatial awareness)  Frequency of Dizziness intermittent  Duration of Dizziness very short and intense, a few seconds, no warning  Symptom Nature Variable  Aggravating Factors Comment (per pt she had similar episodes when she was  young attempting a flip in gymnastics or under water (flip turn).)  Relieving Factors Comments (she says it is immediately relieved once she is on the ground.)  Progression of Symptoms Worse (more frequent)  History of similar episodes when she was young, see previous comments.  Oculomotor Exam  Oculomotor Alignment Normal (wears bifocals, removed for testing, has seen eye doctor in the past year, no reports of double or blurry vision)  Spontaneous Absent  Gaze-induced  Absent  Smooth Pursuits Intact  Saccades Intact  Oculomotor Exam-Fixation Suppressed   Left Head Impulse negative  Right Head Impulse negative  Vestibulo-Ocular Reflex  VOR 1 Head Only (x 1 viewing) normal  VOR Cancellation Normal  Auditory  Comments grossly equal, does report long standing h/o tinnitus  Other Tests  Comments reports valsalva (pushing to have BM) negative for dizziness  Positional Testing  Dix-Hallpike Dix-Hallpike Right;Dix-Hallpike Left  Horizontal Canal Testing Horizontal Canal Right;Horizontal Canal Left  Dix-Hallpike Right  Dix-Hallpike Right Duration 0  Dix-Hallpike Right Symptoms No nystagmus  Dix-Hallpike Left  Dix-Hallpike Left Duration 0  Dix-Hallpike Left Symptoms No nystagmus  Horizontal Canal Right  Horizontal Canal Right Duration 0  Horizontal Canal Right Symptoms Normal  Horizontal Canal Left  Horizontal Canal Left Duration 0  Horizontal Canal Left Symptoms Normal  Orthostatics  Orthostatics Comment RN tech took orthostatics, see vitals flow sheet, they were negative.                           Pertinent Vitals/Pain Pain Assessment: No/denies pain    Home Living Family/patient expects to be discharged to:: Private residence Living Arrangements: Spouse/significant other Available Help at Discharge: Family;Available 24 hours/day;Other (Comment) (husband cannot physically assist much) Type of Home: House Home Access: Stairs to enter Entrance Stairs-Rails:  Right;Left (starts on the left, switches to the right half way up) Entrance Stairs-Number of Steps: 8 Home Layout: Two level;Able to live on main level with bedroom/bathroom Home Equipment: Walker - 4 wheels;Bedside commode;Other (comment) (uses BSC in the shower)      Prior Function Level of Independence: Independent with assistive device(s);Needs assistance   Gait / Transfers Assistance Needed: pt reports she normally ambulates with a 4 wheeled RW           Hand Dominance   Dominant Hand: Right    Extremity/Trunk Assessment   Upper Extremity Assessment Upper Extremity Assessment: Defer to OT evaluation    Lower Extremity Assessment Lower Extremity Assessment: Overall WFL for tasks assessed (pt reports LE lympedema and peripheral neuropathy)    Cervical / Trunk Assessment Cervical / Trunk Assessment: Normal  Communication   Communication: No difficulties  Cognition Arousal/Alertness: Awake/alert Behavior During Therapy: WFL for tasks assessed/performed Overall Cognitive Status: Within Functional Limits for tasks assessed                                        General Comments      Exercises     Assessment/Plan  PT Assessment Patient needs continued PT services  PT Problem List Decreased balance;Decreased knowledge of use of DME       PT Treatment Interventions DME instruction;Gait training;Stair training;Therapeutic activities;Functional mobility training;Therapeutic exercise;Balance training;Patient/family education    PT Goals (Current goals can be found in the Care Plan section)  Acute Rehab PT Goals Patient Stated Goal: to figure out why she has these "episodes" PT Goal Formulation: With patient/family Time For Goal Achievement: 11/15/20 Potential to Achieve Goals: Good    Frequency Min 3X/week   Barriers to discharge        Co-evaluation               AM-PAC PT "6 Clicks" Mobility  Outcome Measure Help needed turning  from your back to your side while in a flat bed without using bedrails?: None Help needed moving from lying on your back to sitting on the side of a flat bed without using bedrails?: None Help needed moving to and from a bed to a chair (including a wheelchair)?: A Little Help needed standing up from a chair using your arms (e.g., wheelchair or bedside chair)?: A Little Help needed to walk in hospital room?: A Little Help needed climbing 3-5 steps with a railing? : A Little 6 Click Score: 20    End of Session Equipment Utilized During Treatment: Gait belt Activity Tolerance: Patient tolerated treatment well Patient left: in chair;with call bell/phone within reach;with chair alarm set Nurse Communication: Mobility status PT Visit Diagnosis: History of falling (Z91.81);Difficulty in walking, not elsewhere classified (R26.2)    Time: 1230-1400 PT Time Calculation (min) (ACUTE ONLY): 90 min   Charges:   PT Evaluation $PT Eval Moderate Complexity: 1 Mod (long vestibular assessment) PT Treatments $Gait Training: 8-22 mins $Therapeutic Activity: 23-37 mins        Verdene Lennert, PT, DPT  Acute Rehabilitation [phone] pager [phone]) [phone] office

## 2020-11-01 NOTE — Plan of Care (Signed)
  Problem: Education: Goal: Knowledge of General Education information will improve Description Including pain rating scale, medication(s)/side effects and non-pharmacologic comfort measures Outcome: Progressing   

## 2020-11-02 DIAGNOSIS — W19XXXA Unspecified fall, initial encounter: Secondary | ICD-10-CM | POA: Diagnosis not present

## 2020-11-02 DIAGNOSIS — I10 Essential (primary) hypertension: Secondary | ICD-10-CM | POA: Diagnosis not present

## 2020-11-02 DIAGNOSIS — S52122A Displaced fracture of head of left radius, initial encounter for closed fracture: Secondary | ICD-10-CM | POA: Diagnosis not present

## 2020-11-02 DIAGNOSIS — Y92009 Unspecified place in unspecified non-institutional (private) residence as the place of occurrence of the external cause: Secondary | ICD-10-CM | POA: Diagnosis not present

## 2020-11-02 LAB — GLUCOSE, CAPILLARY: Glucose-Capillary: 78 mg/dL (ref 70–99)
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
## 2020-11-02 NOTE — Progress Notes (Signed)
Occupational Therapy Treatment Patient Details Name: [name] [name] MRN: [id number] DOB: 1946/10/14 Today's Date: 11/02/2020    History of present illness [name] [name] is a 74 y.o. female with history of CKD-3A, HTN, bipolar disorder, depression, OSA on CPAP, tardive dyskinesia and osteoarthritis brought to ED after she had an episode of "disorientation and fall"; Pt with resultant L radial fx now in sugar tong splint and NWB through L hand, ok to WB through elbow per ortho trauma PA.   OT comments  Pt progressing towards established OT goals. Pt planning for dc this afternoon. Providing education on compensatory techniques for UB ADLs. Pt performing UB and LB dressing with Min A. Pt performing sit<>stand transfer during dressing with Min Guard A. Providing handout and education on AROM for hand, elbow, and shoulder; pt demonstrated understanding. Continue to recommend dc to home with HHOT. Answered all questions and provided education in preparation for dc.    Follow Up Recommendations  Home health OT;Other (comment) ([place] aide)    Equipment Recommendations  Other (comment) (L Platform RW)    Recommendations for Other Services      Precautions / Restrictions Precautions Precautions: Fall Precaution Comments: h/o falls without warning and with fx Required Braces or Orthoses: Splint/Cast Splint/Cast: LUE sugar tong splint Restrictions Weight Bearing Restrictions: Yes LUE Weight Bearing: Non weight bearing       Mobility Bed Mobility Overal bed mobility: Needs Assistance Bed Mobility: Supine to Sit     Supine to sit: Supervision;HOB elevated        Transfers Overall transfer level: Needs assistance Equipment used: None Transfers: Sit to/from Stand Sit to Stand: Min guard         General transfer comment: Min Guard A for safety    Balance Overall balance assessment: Mild deficits observed, not formally tested Sitting-balance support: Feet supported;No upper  extremity supported Sitting balance-Leahy Scale: Good     Standing balance support: No upper extremity supported;During functional activity Standing balance-Leahy Scale: Fair Standing balance comment: Maintaining static standing for dressing                           ADL either performed or assessed with clinical judgement   ADL Overall ADL's : Needs assistance/impaired Eating/Feeding: Set up;Sitting               Upper Body Dressing : Minimal assistance;Sitting Upper Body Dressing Details (indicate cue type and reason): Educating pt on donning LUE first. Pt performing with Min A for repositioning sleeve. Max A t don sling, however, feel that sling positioning is not helpful as pt is unable to achieve 90* elbow flexion Lower Body Dressing: Minimal assistance;Sit to/from stand;Min guard Lower Body Dressing Details (indicate cue type and reason): Min A for repositioning for safe standing. Min Guard in standing             Functional mobility during ADLs: Min guard (sit<>stand) General ADL Comments: Providing education on comepnsatory techniques for dressing, sling managment, and exercises.     Vision   Vision Assessment?: No apparent visual deficits   Perception     Praxis      Cognition Arousal/Alertness: Awake/alert Behavior During Therapy: WFL for tasks assessed/performed Overall Cognitive Status: No family/caregiver present to determine baseline cognitive functioning Area of Impairment: Awareness;Problem solving                           Awareness:  Emergent Problem Solving: Difficulty sequencing;Requires verbal cues;Requires tactile cues General Comments: Pt reporting she feels slightly overwhelmed with education prior to dc. Pt debenefiting from information being presented slowly and to decrease stimuli in environment. Requiring cues for compensatory techniquesfor dressing.        Exercises Exercises: Other exercises Other  Exercises Other Exercises: Providing handout with exercises for LUE. Including finger opposition, composite flexion/extension at digits, elbow flexion/extension, shoulder forward flexion/extension. Pt performing x10   Shoulder Instructions       General Comments      Pertinent Vitals/ Pain       Pain Assessment: No/denies pain  Home Living                                          Prior Functioning/Environment              Frequency  Min 2X/week        Progress Toward Goals  OT Goals(current goals can now be found in the care plan section)  Progress towards OT goals: Progressing toward goals  Acute Rehab OT Goals Patient Stated Goal: To figure out what is going on OT Goal Formulation: With patient Time For Goal Achievement: 11/15/20 Potential to Achieve Goals: Good ADL Goals Pt Will Perform Grooming: with modified independence;standing Pt Will Perform Upper Body Dressing: with modified independence;sitting Pt Will Perform Lower Body Dressing: with set-up;sit to/from stand Pt Will Transfer to Toilet: with modified independence;ambulating Pt Will Perform Toileting - Clothing Manipulation and hygiene: with modified independence;sit to/from stand  Plan Discharge plan remains appropriate    Co-evaluation                 AM-PAC OT "6 Clicks" Daily Activity     Outcome Measure   Help from another person eating meals?: A Little Help from another person taking care of personal grooming?: A Little Help from another person toileting, which includes using toliet, bedpan, or urinal?: A Lot Help from another person bathing (including washing, rinsing, drying)?: A Lot Help from another person to put on and taking off regular upper body clothing?: A Lot Help from another person to put on and taking off regular lower body clothing?: A Lot 6 Click Score: 14    End of Session Equipment Utilized During Treatment: Other (comment) (L sling)  OT Visit  Diagnosis: Other abnormalities of gait and mobility (R26.89);History of falling (Z91.81);Pain Pain - Right/Left: Left Pain - part of body: Arm   Activity Tolerance Patient tolerated treatment well   Patient Left in bed;with nursing/sitter in room   Nurse Communication Mobility status        Time: 6659-9357 OT Time Calculation (min): 22 min  Charges: OT General Charges $OT Visit: 1 Visit OT Treatments $Self Care/Home Management : 8-22 mins  [place], OTR/L Acute Rehab Pager: [phone] Office: [place] 11/02/2020, 12:16 PM

## 2020-11-02 NOTE — Progress Notes (Signed)
RN gave pt discharge instructions and she stated understanding, no new medications have be been added. OT helped pt get dressed, belongings have been packed. Husband on the way for pickup.

## 2020-11-04 DIAGNOSIS — I083 Combined rheumatic disorders of mitral, aortic and tricuspid valves: Secondary | ICD-10-CM | POA: Diagnosis not present

## 2020-11-04 DIAGNOSIS — I6501 Occlusion and stenosis of right vertebral artery: Secondary | ICD-10-CM | POA: Diagnosis not present

## 2020-11-04 DIAGNOSIS — T43505D Adverse effect of unspecified antipsychotics and neuroleptics, subsequent encounter: Secondary | ICD-10-CM | POA: Diagnosis not present

## 2020-11-04 DIAGNOSIS — K8020 Calculus of gallbladder without cholecystitis without obstruction: Secondary | ICD-10-CM | POA: Diagnosis not present

## 2020-11-04 DIAGNOSIS — M19022 Primary osteoarthritis, left elbow: Secondary | ICD-10-CM | POA: Diagnosis not present

## 2020-11-04 DIAGNOSIS — K5900 Constipation, unspecified: Secondary | ICD-10-CM | POA: Diagnosis not present

## 2020-11-04 DIAGNOSIS — E785 Hyperlipidemia, unspecified: Secondary | ICD-10-CM | POA: Diagnosis not present

## 2020-11-04 DIAGNOSIS — I6523 Occlusion and stenosis of bilateral carotid arteries: Secondary | ICD-10-CM | POA: Diagnosis not present

## 2020-11-04 DIAGNOSIS — Q6101 Congenital single renal cyst: Secondary | ICD-10-CM | POA: Diagnosis not present

## 2020-11-04 DIAGNOSIS — I452 Bifascicular block: Secondary | ICD-10-CM | POA: Diagnosis not present

## 2020-11-04 DIAGNOSIS — S89102D Unspecified physeal fracture of lower end of left tibia, subsequent encounter for fracture with routine healing: Secondary | ICD-10-CM | POA: Diagnosis not present

## 2020-11-04 DIAGNOSIS — G319 Degenerative disease of nervous system, unspecified: Secondary | ICD-10-CM | POA: Diagnosis not present

## 2020-11-04 DIAGNOSIS — D509 Iron deficiency anemia, unspecified: Secondary | ICD-10-CM | POA: Diagnosis not present

## 2020-11-04 DIAGNOSIS — F319 Bipolar disorder, unspecified: Secondary | ICD-10-CM | POA: Diagnosis not present

## 2020-11-04 DIAGNOSIS — G4733 Obstructive sleep apnea (adult) (pediatric): Secondary | ICD-10-CM | POA: Diagnosis not present

## 2020-11-04 DIAGNOSIS — E1122 Type 2 diabetes mellitus with diabetic chronic kidney disease: Secondary | ICD-10-CM | POA: Diagnosis not present

## 2020-11-04 DIAGNOSIS — S59102D Unspecified physeal fracture of upper end of radius, left arm, subsequent encounter for fracture with routine healing: Secondary | ICD-10-CM | POA: Diagnosis not present

## 2020-11-04 DIAGNOSIS — E1142 Type 2 diabetes mellitus with diabetic polyneuropathy: Secondary | ICD-10-CM | POA: Diagnosis not present

## 2020-11-04 DIAGNOSIS — I129 Hypertensive chronic kidney disease with stage 1 through stage 4 chronic kidney disease, or unspecified chronic kidney disease: Secondary | ICD-10-CM | POA: Diagnosis not present

## 2020-11-04 DIAGNOSIS — I890 Lymphedema, not elsewhere classified: Secondary | ICD-10-CM | POA: Diagnosis not present

## 2020-11-04 DIAGNOSIS — I6782 Cerebral ischemia: Secondary | ICD-10-CM | POA: Diagnosis not present

## 2020-11-04 DIAGNOSIS — G2401 Drug induced subacute dyskinesia: Secondary | ICD-10-CM | POA: Diagnosis not present

## 2020-11-04 DIAGNOSIS — I700 Atherosclerosis of aorta: Secondary | ICD-10-CM | POA: Diagnosis not present

## 2020-11-04 DIAGNOSIS — N1831 Chronic kidney disease, stage 3a: Secondary | ICD-10-CM | POA: Diagnosis not present

## 2020-11-04 DIAGNOSIS — M160 Bilateral primary osteoarthritis of hip: Secondary | ICD-10-CM | POA: Diagnosis not present

## 2020-11-04 DIAGNOSIS — D631 Anemia in chronic kidney disease: Secondary | ICD-10-CM | POA: Diagnosis not present

## 2020-11-06 ENCOUNTER — Telehealth: Payer: Self-pay | Admitting: Neurology

## 2020-11-06 DIAGNOSIS — S52135A Nondisplaced fracture of neck of left radius, initial encounter for closed fracture: Secondary | ICD-10-CM | POA: Diagnosis not present

## 2020-11-06 DIAGNOSIS — M25522 Pain in left elbow: Secondary | ICD-10-CM | POA: Diagnosis not present

## 2020-11-06 NOTE — Telephone Encounter (Signed)
Pt. states she doesn't know what's up or down. She states she was recently in the hospital & [place] did all that they could & she is seeking help. She states she has broken her shoulder & elbow & is asking for a possible referral to [facility]. Please advise.

## 2020-11-06 NOTE — Telephone Encounter (Signed)
I called the patient, left message, I will call back later. 

## 2020-11-06 NOTE — Telephone Encounter (Signed)
I called the patient.  The patient was just in the hospital on 31 October 2020, she had several events where she will suddenly fall, she has what she feels is spatial disorientation that lasts for only 1 to 2 seconds.  By the time she hits the ground she is back to normal.  She does not report true vertigo, cerebrovascular work-up was unremarkable.  The patient is to get a 30-day cardiac monitor study, if she has not heard anything about scheduling within the next week she will call me and I will reorder the study.

## 2020-11-07 ENCOUNTER — Other Ambulatory Visit: Payer: Self-pay | Admitting: Neurology

## 2020-11-07 DIAGNOSIS — R404 Transient alteration of awareness: Secondary | ICD-10-CM

## 2020-11-08 DIAGNOSIS — I129 Hypertensive chronic kidney disease with stage 1 through stage 4 chronic kidney disease, or unspecified chronic kidney disease: Secondary | ICD-10-CM | POA: Diagnosis not present

## 2020-11-08 DIAGNOSIS — E119 Type 2 diabetes mellitus without complications: Secondary | ICD-10-CM | POA: Diagnosis not present

## 2020-11-08 DIAGNOSIS — F3132 Bipolar disorder, current episode depressed, moderate: Secondary | ICD-10-CM | POA: Diagnosis not present

## 2020-11-08 DIAGNOSIS — E1121 Type 2 diabetes mellitus with diabetic nephropathy: Secondary | ICD-10-CM | POA: Diagnosis not present

## 2020-11-08 DIAGNOSIS — N1832 Chronic kidney disease, stage 3b: Secondary | ICD-10-CM | POA: Diagnosis not present

## 2020-11-08 DIAGNOSIS — E1142 Type 2 diabetes mellitus with diabetic polyneuropathy: Secondary | ICD-10-CM | POA: Diagnosis not present

## 2020-11-08 DIAGNOSIS — E7800 Pure hypercholesterolemia, unspecified: Secondary | ICD-10-CM | POA: Diagnosis not present

## 2020-11-08 DIAGNOSIS — K219 Gastro-esophageal reflux disease without esophagitis: Secondary | ICD-10-CM | POA: Diagnosis not present

## 2020-11-08 DIAGNOSIS — F319 Bipolar disorder, unspecified: Secondary | ICD-10-CM | POA: Diagnosis not present

## 2020-11-13 DIAGNOSIS — Z20822 Contact with and (suspected) exposure to covid-19: Secondary | ICD-10-CM | POA: Diagnosis not present

## 2020-11-13 DIAGNOSIS — M791 Myalgia, unspecified site: Secondary | ICD-10-CM | POA: Diagnosis not present

## 2020-11-15 ENCOUNTER — Encounter: Payer: Self-pay | Admitting: Internal Medicine

## 2020-11-15 ENCOUNTER — Ambulatory Visit (INDEPENDENT_AMBULATORY_CARE_PROVIDER_SITE_OTHER): Payer: Medicare Other

## 2020-11-15 DIAGNOSIS — R42 Dizziness and giddiness: Secondary | ICD-10-CM | POA: Diagnosis not present

## 2020-11-15 DIAGNOSIS — R404 Transient alteration of awareness: Secondary | ICD-10-CM

## 2020-11-16 ENCOUNTER — Encounter: Payer: Self-pay | Admitting: Cardiovascular Disease

## 2020-11-16 ENCOUNTER — Telehealth: Payer: Self-pay | Admitting: Neurology

## 2020-11-16 NOTE — Telephone Encounter (Signed)
Computer read out from cardiac monitor suggests a 1 minute episode of atrial fibrillation without reported symptoms.  This episode has not been officially reviewed, I will wait for a formal result.  I am not clear that this type of finding would result in a brief syncopal event, but it may deserve further attention if confirmed.  I will await the formal report.   [facility] reports a 1 minute episode of atrial fibrillation with controlled rates on her event monitor. No symptoms reported, automatic detection. Unable to review the actual recording at this time. Monitor connected for syncope/near-syncope from the Neurology office. Has never been seen by Cardiology to date and is not on anticoagulation. Frequent falls and injuries reported.  Will forward information to Dr. Anne Hahn and Dr. Graciela Husbands and wait for review of hard copy transmission.

## 2020-11-16 NOTE — Progress Notes (Signed)
[facility] reports a 1 minute episode of atrial fibrillation with controlled rates on her event monitor. No symptoms reported, automatic detection. Unable to review the actual recording at this time. Monitor connected for syncope/near-syncope from the Neurology office. Has never been seen by Cardiology to date and is not on anticoagulation. Frequent falls and injuries reported.  Will forward information to Dr. Anne Hahn and Dr. Graciela Husbands and wait for review of hard copy transmission.

## 2020-11-21 ENCOUNTER — Telehealth: Payer: Self-pay | Admitting: *Deleted

## 2020-11-21 NOTE — Telephone Encounter (Signed)
Received critical monitor report, day 6 from [facility]. Auto trigger 11/20/20 10:39 pm CST. Sinus Bradycardia w Run of V-Tach (8 beats) Per report pt was home watching TV.  No symptoms. Monitor ordered by neuro - transient alteration of awareness, dizziness.  Reviewed by DOD, Dr. Izora Ribas.  No new orders, continue to monitor.  Report signed and placed to be scanned into chart.

## 2020-11-30 DIAGNOSIS — F3181 Bipolar II disorder: Secondary | ICD-10-CM | POA: Diagnosis not present

## 2020-12-02 ENCOUNTER — Other Ambulatory Visit: Payer: Self-pay | Admitting: Geriatric Medicine

## 2020-12-02 DIAGNOSIS — Z1231 Encounter for screening mammogram for malignant neoplasm of breast: Secondary | ICD-10-CM

## 2020-12-02 DIAGNOSIS — E2839 Other primary ovarian failure: Secondary | ICD-10-CM

## 2020-12-11 DIAGNOSIS — F411 Generalized anxiety disorder: Secondary | ICD-10-CM | POA: Diagnosis not present

## 2020-12-11 DIAGNOSIS — G2401 Drug induced subacute dyskinesia: Secondary | ICD-10-CM | POA: Diagnosis not present

## 2020-12-11 DIAGNOSIS — F5101 Primary insomnia: Secondary | ICD-10-CM | POA: Diagnosis not present

## 2020-12-11 DIAGNOSIS — F3181 Bipolar II disorder: Secondary | ICD-10-CM | POA: Diagnosis not present

## 2020-12-13 DIAGNOSIS — F3181 Bipolar II disorder: Secondary | ICD-10-CM | POA: Diagnosis not present

## 2020-12-21 ENCOUNTER — Other Ambulatory Visit: Payer: Self-pay | Admitting: Neurology

## 2020-12-21 DIAGNOSIS — R42 Dizziness and giddiness: Secondary | ICD-10-CM

## 2020-12-21 DIAGNOSIS — R404 Transient alteration of awareness: Secondary | ICD-10-CM

## 2020-12-23 ENCOUNTER — Telehealth: Payer: Self-pay | Admitting: Neurology

## 2020-12-23 DIAGNOSIS — I480 Paroxysmal atrial fibrillation: Secondary | ICD-10-CM

## 2020-12-23 DIAGNOSIS — R55 Syncope and collapse: Secondary | ICD-10-CM

## 2020-12-23 NOTE — Telephone Encounter (Signed)
I called the patient. The cardiac monitor study showed intermittent episodes of atrial fibrillation. I will make a referral to cardiology. The patient did not have a syncopal or presyncopal event while being monitored. During episodes of afibrillation, the heart rate was not extremely elevated.  I am not clear that this explains her syncopal or near syncopal events. I will check an EEG study. I will make a referral for cardiology, the patient likely will require chronic anticoagulation.   Cardiac monitor study 12/22/20:  Conclusions: Intermittent episodes of brief atrial fibrillation

## 2020-12-28 DIAGNOSIS — F3181 Bipolar II disorder: Secondary | ICD-10-CM | POA: Diagnosis not present

## 2021-01-02 DIAGNOSIS — E785 Hyperlipidemia, unspecified: Secondary | ICD-10-CM | POA: Diagnosis not present

## 2021-01-02 DIAGNOSIS — D509 Iron deficiency anemia, unspecified: Secondary | ICD-10-CM | POA: Diagnosis not present

## 2021-01-02 DIAGNOSIS — N1831 Chronic kidney disease, stage 3a: Secondary | ICD-10-CM | POA: Diagnosis not present

## 2021-01-02 DIAGNOSIS — K219 Gastro-esophageal reflux disease without esophagitis: Secondary | ICD-10-CM | POA: Diagnosis not present

## 2021-01-02 DIAGNOSIS — E1122 Type 2 diabetes mellitus with diabetic chronic kidney disease: Secondary | ICD-10-CM | POA: Diagnosis not present

## 2021-01-02 DIAGNOSIS — E1121 Type 2 diabetes mellitus with diabetic nephropathy: Secondary | ICD-10-CM | POA: Diagnosis not present

## 2021-01-02 DIAGNOSIS — E119 Type 2 diabetes mellitus without complications: Secondary | ICD-10-CM | POA: Diagnosis not present

## 2021-01-02 DIAGNOSIS — I129 Hypertensive chronic kidney disease with stage 1 through stage 4 chronic kidney disease, or unspecified chronic kidney disease: Secondary | ICD-10-CM | POA: Diagnosis not present

## 2021-01-02 DIAGNOSIS — E7800 Pure hypercholesterolemia, unspecified: Secondary | ICD-10-CM | POA: Diagnosis not present

## 2021-01-02 DIAGNOSIS — E1142 Type 2 diabetes mellitus with diabetic polyneuropathy: Secondary | ICD-10-CM | POA: Diagnosis not present

## 2021-01-03 ENCOUNTER — Other Ambulatory Visit: Payer: Medicare Other

## 2021-01-11 NOTE — Progress Notes (Deleted)
CARDIOLOGY CONSULT NOTE       Patient ID: [name] [name] MRN: [id number] DOB/AGE: Sep 11, 1946 75 y.o.  Referring Physician: Jannifer Franklin Primary Physician: [name], MD Primary Cardiologist: New Reason for Consultation: PAF  Active Problems:   * No active hospital problems. *   HPI:  75 y.o. referred by Dr Jannifer Franklin for PAF. Hospitalized 12/14-16. Cardiology not involved History of CKD-3A, bipolar, OSA on CPAP, tardive dyskinesia and arthritis. Seen in ER for MS changes and fall She had spatial disorientation Hit her head with two lacerations and left radial fracture Describes 5 similar episodes from age 10 No history of seizures or heart issues TD from antipsychotics using Ingrezza. ECG with chronic RBBB/LAFB TTE done 11/01/20 EF 60-65% LA only 3.5 cm trivial MR   CHADVASC 3 for HTN and female   Note CT/MRI showed no stroke  MRA showed no high grade ICA stenosis   Cardiac event monitor read by Dr Caryl Comes 12/21/20 reviewed Findings HR  avg 84  Min 42-Max 123  PVCs Rare, less than 1%   PACs Rare, less than 1%   VT Nonsustained  1 episodes; fastest 160  bpm for 8 beats; l AFib recurrent last secs to minutes for a total of6 hrs (Controlled ventricular response)   Conclusions: Intermittent episodes of brief atrial fibrillation   She has neuropathy, gait disorder and previous vertigo with falls Uses a walker On metformin for neuropathy but A1c Never high and this was stopped on hospital d/c Ingrezza known to cause drowsiness and patient also uses xanax , zoloft and lamictal QT interval was normal on admitted ECG at 461 msec   ***  ROS All other systems reviewed and negative except as noted above  Past Medical History:  Diagnosis Date  . Allergic rhinitis   . Bipolar disorder ([place])   . Cholelithiasis    seen on CAT 01/04/09  . Chronic renal disease, stage III ([place])    3/12  . Complication of anesthesia    versed and fentanyl did not work for colonoscopy  . Diabetic peripheral  neuropathy ([place]) 07/18/2020  . DM (diabetes mellitus) ([place])   . Edema   . Gait abnormality 08/26/2019  . HLD (hyperlipidemia)   . HTN (hypertension)   . Iron deficiency anemia    3/12   . Lymphedema    tarda  . Neuropathy   . Obstructive sleep apnea on CPAP   . Osteoarthritis   . Renal cyst    2.7 cm seen on CAT 01/04/09  . Tardive dyskinesia     Family History  Problem Relation Age of Onset  . Diabetes Mother   . Hyperlipidemia Mother   . Heart disease Father   . Heart attack Father   . Colon cancer Neg Hx     Social History   Socioeconomic History  . Marital status: Married    Spouse name: Not on file  . Number of children: 2  . Years of education: Not on file  . Highest education level: Not on file  Occupational History  . Occupation: Retired Pharmacist, hospital  Tobacco Use  . Smoking status: Former Smoker    Types: Cigarettes    Quit date: 02/02/1991    Years since quitting: 29.9  . Smokeless tobacco: Never Used  Substance and Sexual Activity  . Alcohol use: Not Currently    Comment: less than 1 a day  . Drug use: No  . Sexual activity: Not on file  Other Topics Concern  . Not on  file  Social History Narrative   Right handed    Caffeine ~1-2 cups every other day    Lives at home with husband    Social Determinants of Health   Financial Resource Strain: Not on file  Food Insecurity: Not on file  Transportation Needs: Not on file  Physical Activity: Not on file  Stress: Not on file  Social Connections: Not on file  Intimate Partner Violence: Not on file    Past Surgical History:  Procedure Laterality Date  . ABDOMINAL HYSTERECTOMY    . BALLOON DILATION N/A 04/24/2020   Procedure: BALLOON DILATION;  Surgeon: Ronnette Juniper, MD;  Location: [facility];  Service: Gastroenterology;  Laterality: N/A;  . BIOPSY  04/24/2020   Procedure: BIOPSY;  Surgeon: Ronnette Juniper, MD;  Location: [facility];  Service: Gastroenterology;;  . Lum Keas INJECTION N/A 04/24/2020   Procedure:  POSSIBLE BOTOX INJECTION;  Surgeon: Ronnette Juniper, MD;  Location: [facility];  Service: Gastroenterology;  Laterality: N/A;  . COLONOSCOPY  2009; 07/09/11   2009:normal (polyp was not adenoma); 2012:   . DILATION AND CURETTAGE OF UTERUS    . ESOPHAGEAL MANOMETRY N/A 05/31/2020   Procedure: ESOPHAGEAL MANOMETRY (EM);  Surgeon: Ronnette Juniper, MD;  Location: [facility];  Service: Gastroenterology;  Laterality: N/A;  . ESOPHAGOGASTRODUODENOSCOPY  07/09/11   Erosive gastritis  . ESOPHAGOGASTRODUODENOSCOPY (EGD) WITH PROPOFOL N/A 04/24/2020   Procedure: ESOPHAGOGASTRODUODENOSCOPY (EGD) WITH PROPOFOL;  Surgeon: Ronnette Juniper, MD;  Location: [facility];  Service: Gastroenterology;  Laterality: N/A;  . FRACTURE SURGERY     rt fx upper arm  . GIVENS CAPSULE STUDY  07/25/2011   normal small bowel  . REIMPLANT URETER IN BLADDER  1996  . TRIGGER FINGER RELEASE Left 02/04/2013   Procedure: RELEASE TRIGGER FINGER/A-1 PULLEY LEFT RING FINGER;  Surgeon: Tennis Must, MD;  Location: [facility];  Service: Orthopedics;  Laterality: Left;      Current Outpatient Medications:  .  ALPRAZolam (XANAX) 1 MG tablet, Take 1 mg by mouth 2 (two) times daily as needed for anxiety or sleep., Disp: , Rfl:  .  amLODipine (NORVASC) 5 MG tablet, Take 5 mg by mouth at bedtime. , Disp: , Rfl:  .  ferrous sulfate 325 (65 FE) MG tablet, Take 325 mg by mouth at bedtime., Disp: , Rfl:  .  lamoTRIgine (LAMICTAL) 200 MG tablet, Take 300 mg by mouth daily., Disp: , Rfl:  .  Multiple Vitamins-Minerals (MULTIVITAMIN WITH MINERALS) tablet, Take 1 tablet by mouth daily., Disp: , Rfl:  .  sertraline (ZOLOFT) 100 MG tablet, Take 100 mg by mouth 2 (two) times daily., Disp: , Rfl:  .  simvastatin (ZOCOR) 40 MG tablet, Take 40 mg by mouth at bedtime. , Disp: , Rfl:  .  Valbenazine Tosylate (INGREZZA) 40 MG CAPS, Take 40 mg by mouth at bedtime., Disp: 60 capsule, Rfl:     Physical Exam: There were no vitals taken for this visit.    Affect appropriate Healthy:  appears stated age 75: normal Neck supple with no adenopathy JVP normal no bruits no thyromegaly Lungs clear with no wheezing and good diaphragmatic motion Heart:  S1/S2 no murmur, no rub, gallop or click PMI normal Abdomen: benighn, BS positve, no tenderness, no AAA no bruit.  No HSM or HJR Distal pulses intact with no bruits No edema Neuro non-focal tardive dyskinesia  Skin warm and dry No muscular weakness   Labs:   Lab Results  Component Value Date   WBC 5.4  11/01/2020   HGB 11.7 (L) 11/01/2020   HCT 33.1 (L) 11/01/2020   MCV 99.4 11/01/2020   PLT 153 11/01/2020   No results for input(s): NA, K, CL, CO2, BUN, CREATININE, CALCIUM, PROT, BILITOT, ALKPHOS, ALT, AST, GLUCOSE in the last 168 hours.  Invalid input(s): LABALBU No results found for: CKTOTAL, CKMB, CKMBINDEX, TROPONINI  Lab Results  Component Value Date   CHOL 190 11/01/2020   CHOL 232 (H) 10/30/2007   Lab Results  Component Value Date   HDL 86 11/01/2020   HDL 109 10/30/2007   Lab Results  Component Value Date   LDLCALC 83 11/01/2020   LDLCALC 112 (H) 10/30/2007   Lab Results  Component Value Date   TRIG 104 11/01/2020   TRIG 57 10/30/2007   Lab Results  Component Value Date   CHOLHDL 2.2 11/01/2020   CHOLHDL 2.1 Ratio 10/30/2007   No results found for: LDLDIRECT    Radiology: CARDIAC EVENT MONITOR  Result Date: 12/23/2020 Indication: transient altered status Duration: 35d Findings HR  avg 63  Min 42-Max 123 PVCs Rare, less than 1%  PACs Rare, less than 1%  VT Nonsustained  1 episodes; fastest 160  bpm for 8 beats; l AFib recurrent last secs to minutes for a total of6 hrs (Controlled ventricular response) Symptoms: none   Triggered: none Conclusions: Intermittent episodes of brief atrial fibrillation Recommendations Per ordering MD    EKG: SR rate 60 RBBB/LAFB   ASSESSMENT AND PLAN:   1. PAF: in elderly female prone to falls Not clear that her MS  changes related as MRI/CT negative And indicates having episodes as young as age 76 *** 2. HTN:  Stable continue norvasc 3. HLD:  On statin  4. TD: from previous antipsychotic use Neurologically on multiple meds including xanax, lamictal, xoloft and ingrezza ***  ***  Signed: Jenkins Rouge 01/11/2021, 6:08 PM

## 2021-01-17 ENCOUNTER — Other Ambulatory Visit: Payer: Self-pay

## 2021-01-17 ENCOUNTER — Ambulatory Visit (INDEPENDENT_AMBULATORY_CARE_PROVIDER_SITE_OTHER): Payer: Medicare Other | Admitting: Internal Medicine

## 2021-01-17 ENCOUNTER — Encounter: Payer: Self-pay | Admitting: Internal Medicine

## 2021-01-17 ENCOUNTER — Ambulatory Visit: Payer: Medicare Other | Admitting: Cardiovascular Disease

## 2021-01-17 VITALS — BP 146/86 | HR 50 | Ht 63.0 in | Wt 161.0 lb

## 2021-01-17 DIAGNOSIS — I4891 Unspecified atrial fibrillation: Secondary | ICD-10-CM

## 2021-01-17 NOTE — Patient Instructions (Signed)
Medication Instructions:  Your physician recommends that you continue on your current medications as directed. Please refer to the Current Medication list given to you today.  *If you need a refill on your cardiac medications before your next appointment, please call your pharmacy*   Lab Work: None ordered.  If you have labs (blood work) drawn today and your tests are completely normal, you will receive your results only by: Marland Kitchen MyChart Message (if you have MyChart) OR . A paper copy in the mail If you have any lab test that is abnormal or we need to change your treatment, we will call you to review the results.   Testing/Procedures: Your Dr has recommended you have a Loop Recorder inserted.   Follow-Up: At [facility], you and your health needs are our priority.  As part of our continuing mission to provide you with exceptional heart care, we have created designated Provider Care Teams.  These Care Teams include your primary Cardiologist (physician) and Advanced Practice Providers (APPs -  Physician Assistants and Nurse Practitioners) who all work together to provide you with the care you need, when you need it.  We recommend signing up for the patient portal called "MyChart".  Sign up information is provided on this After Visit Summary.  MyChart is used to connect with patients for Virtual Visits (Telemedicine).  Patients are able to view lab/test results, encounter notes, upcoming appointments, etc.  Non-urgent messages can be sent to your provider as well.   To learn more about what you can do with MyChart, go to [email or web address].    Your next appointment:   To be scheduled

## 2021-01-17 NOTE — Progress Notes (Signed)
ELECTROPHYSIOLOGY CONSULT NOTE  Patient ID: [name] [name], MRN: [id number], DOB/AGE: 1946-10-05 75 y.o. Admit date: (Not on file) Date of Consult: 01/17/2021  Primary Physician: [name], MD Primary Cardiologist: new     [name] [name] is a 75 y.o. female who is being seen today for the evaluation of spells at the request of Dr. Jannifer Franklin.    HPI [name] [name] is a 75 y.o. female with a complex past medical history including tardive dyskinesia related to medication issues neuropathy-idiopathic in the context of bipolar disorder who has a 2 to 3-year history of recurrent spells.  These are characterized by an abrupt fall and with rapid resolution of her symptoms once she is hit the ground.  They come on with very brief warning which is characterized by a disorientation, described as no issues in the kitchen but not knowing where the door or the wall were, as well as a tornado like rushing sensation.  The entire event is less than 3-5 seconds.  She has fallen sufficiently hard to have fractured elbow and shoulder.  She reports that she had no pain with these fractures curiously.  The disorientation that she describes is specifically not associated with a vertiginous sensation Hospitalized 12/21 and an event recorder was ordered.  Read and reviewed with the patient today and recorded no symptomatic events.  Episodes of atrial fibrillation at variable rates were identified for total of about 6 hours over 30 days some nonsustained ventricular tachycardia at rates of about 150.  Electrocardiogram demonstrated right bundle branch block left anterior fascicular block without first-degree AV block  Not very ambulatory secondary to tardive dyskinesia and neuropathy  DATE TEST EF   12/21  Echo  55-65 %         Date Cr K Hgb  12/21 1.18 3.6 11.7            Past Medical History:  Diagnosis Date  . Allergic rhinitis   . Bipolar disorder ([place])   . Cholelithiasis    seen on CAT  01/04/09  . Chronic renal disease, stage III ([place])    3/12  . Complication of anesthesia    versed and fentanyl did not work for colonoscopy  . Diabetic peripheral neuropathy ([place]) 07/18/2020  . DM (diabetes mellitus) ([place])   . Edema   . Gait abnormality 08/26/2019  . HLD (hyperlipidemia)   . HTN (hypertension)   . Iron deficiency anemia    3/12   . Lymphedema    tarda  . Neuropathy   . Obstructive sleep apnea on CPAP   . Osteoarthritis   . Renal cyst    2.7 cm seen on CAT 01/04/09  . Tardive dyskinesia       Surgical History:  Past Surgical History:  Procedure Laterality Date  . ABDOMINAL HYSTERECTOMY    . BALLOON DILATION N/A 04/24/2020   Procedure: BALLOON DILATION;  Surgeon: Ronnette Juniper, MD;  Location: [facility];  Service: Gastroenterology;  Laterality: N/A;  . BIOPSY  04/24/2020   Procedure: BIOPSY;  Surgeon: Ronnette Juniper, MD;  Location: [facility];  Service: Gastroenterology;;  . Lum Keas INJECTION N/A 04/24/2020   Procedure: POSSIBLE BOTOX INJECTION;  Surgeon: Ronnette Juniper, MD;  Location: [facility];  Service: Gastroenterology;  Laterality: N/A;  . COLONOSCOPY  2009; 07/09/11   2009:normal (polyp was not adenoma); 2012:   . DILATION AND CURETTAGE OF UTERUS    . ESOPHAGEAL MANOMETRY N/A 05/31/2020   Procedure: ESOPHAGEAL MANOMETRY (EM);  Surgeon: Ronnette Juniper, MD;  Location: [facility];  Service: Gastroenterology;  Laterality: N/A;  . ESOPHAGOGASTRODUODENOSCOPY  07/09/11   Erosive gastritis  . ESOPHAGOGASTRODUODENOSCOPY (EGD) WITH PROPOFOL N/A 04/24/2020   Procedure: ESOPHAGOGASTRODUODENOSCOPY (EGD) WITH PROPOFOL;  Surgeon: Ronnette Juniper, MD;  Location: [facility];  Service: Gastroenterology;  Laterality: N/A;  . FRACTURE SURGERY     rt fx upper arm  . GIVENS CAPSULE STUDY  07/25/2011   normal small bowel  . REIMPLANT URETER IN BLADDER  1996  . TRIGGER FINGER RELEASE Left 02/04/2013   Procedure: RELEASE TRIGGER FINGER/A-1 PULLEY LEFT RING FINGER;  Surgeon: Tennis Must, MD;   Location: [facility];  Service: Orthopedics;  Laterality: Left;     Home Meds: Current Meds  Medication Sig  . ALPRAZolam (XANAX) 1 MG tablet Take 1 mg by mouth 2 (two) times daily as needed for anxiety or sleep.  Marland Kitchen amLODipine (NORVASC) 5 MG tablet Take 5 mg by mouth at bedtime.   . lamoTRIgine (LAMICTAL) 200 MG tablet Take 300 mg by mouth daily.  . Multiple Vitamins-Minerals (MULTIVITAMIN WITH MINERALS) tablet Take 1 tablet by mouth daily.  . sertraline (ZOLOFT) 100 MG tablet Take 100 mg by mouth 2 (two) times daily.  . simvastatin (ZOCOR) 40 MG tablet Take 40 mg by mouth at bedtime.   Minus [place] Tosylate (INGREZZA) 40 MG CAPS Take 40 mg by mouth at bedtime.    Allergies:  Allergies  Allergen Reactions  . Latuda [Lurasidone] Other (See Comments)    Increased suicidal thoughts   . Abilify [Aripiprazole] Other (See Comments)    Tardive Dyskinesia  . Neosporin Plus Max St   . Seroquel [Quetiapine] Other (See Comments)    Tardive Dyskinesia    Social History   Socioeconomic History  . Marital status: Married    Spouse name: Not on file  . Number of children: 2  . Years of education: Not on file  . Highest education level: Not on file  Occupational History  . Occupation: Retired Pharmacist, hospital  Tobacco Use  . Smoking status: Former Smoker    Types: Cigarettes    Quit date: 02/02/1991    Years since quitting: 29.9  . Smokeless tobacco: Never Used  Substance and Sexual Activity  . Alcohol use: Not Currently    Comment: less than 1 a day  . Drug use: No  . Sexual activity: Not on file  Other Topics Concern  . Not on file  Social History Narrative   Right handed    Caffeine ~1-2 cups every other day    Lives at home with husband    Social Determinants of Health   Financial Resource Strain: Not on file  Food Insecurity: Not on file  Transportation Needs: Not on file  Physical Activity: Not on file  Stress: Not on file  Social Connections: Not on file   Intimate Partner Violence: Not on file     Family History  Problem Relation Age of Onset  . Diabetes Mother   . Hyperlipidemia Mother   . Heart disease Father   . Heart attack Father   . Colon cancer Neg Hx      ROS:  Please see the history of present illness.     All other systems reviewed and negative.    Physical Exam: Blood pressure (!) 146/86, pulse (!) 50, height 5\' 3"  (1.6 m), weight 161 lb (73 kg), SpO2 96 %. General: Well developed, well nourished female in no acute distress. Head: Normocephalic,  atraumatic, sclera non-icteric, no xanthomas, nares are without discharge. EENT: normal  Lymph Nodes:  none Neck: Negative for carotid bruits. JVD not elevated. Back:without scoliosis kyphosis  Lungs: Clear bilaterally to auscultation without wheezes, rales, or rhonchi. Breathing is unlabored. Heart: RRR with S1 S2. No  murmur . No rubs, or gallops appreciated. Abdomen: Soft, non-tender, non-distended with normoactive bowel sounds. No hepatomegaly. No rebound/guarding. No obvious abdominal masses. Msk:  Strength and tone appear normal for age. Extremities: No clubbing or cyanosis.  1+ edema.  Distal pedal pulses are 2+ and equal bilaterally. Skin: Warm and Dry Neuro: Alert and oriented X 3. CN III-XII intact Grossly normal sensory and motor function . Psych:  Responds to questions appropriately with a normal affect.      Labs: Cardiac Enzymes No results for input(s): CKTOTAL, CKMB, TROPONINI in the last 72 hours. CBC Lab Results  Component Value Date   WBC 5.4 11/01/2020   HGB 11.7 (L) 11/01/2020   HCT 33.1 (L) 11/01/2020   MCV 99.4 11/01/2020   PLT 153 11/01/2020   PROTIME: No results for input(s): LABPROT, INR in the last 72 hours. Chemistry No results for input(s): NA, K, CL, CO2, BUN, CREATININE, CALCIUM, PROT, BILITOT, ALKPHOS, ALT, AST, GLUCOSE in the last 168 hours.  Invalid input(s): LABALBU Lipids Lab Results  Component Value Date   CHOL 190  11/01/2020   HDL 86 11/01/2020   LDLCALC 83 11/01/2020   TRIG 104 11/01/2020   BNP No results found for: PROBNP Thyroid Function Tests: No results for input(s): TSH, T4TOTAL, T3FREE, THYROIDAB in the last 72 hours.  Invalid input(s): FREET3 Miscellaneous No results found for: DDIMER  Radiology/Studies:  CARDIAC EVENT MONITOR  Result Date: 12/23/2020 Indication: transient altered status Duration: 35d Findings HR  avg 63  Min 42-Max 123 PVCs Rare, less than 1%  PACs Rare, less than 1%  VT Nonsustained  1 episodes; fastest 160  bpm for 8 beats; l AFib recurrent last secs to minutes for a total of6 hrs (Controlled ventricular response) Symptoms: none   Triggered: none Conclusions: Intermittent episodes of brief atrial fibrillation Recommendations Per ordering MD    EKG: Sinus rhythm at 50 Interval 17/12-14/50 Axis left -56   Assessment and Plan:  Falls  Bifascicular block  Ventricular tachycardia-nonsustained  Atrial fibrillation-non sustained SCAF  Tardive dyskinesia   The patient has had a series of falls characterized by very little warning, a disorientation rushing sensation that precedes a fall and immediate resolution of her symptoms.  The brevity of the events would speak against a seizure.  The unusual disorientation/"tornado sensation I do not understand but uncommon manifestations of common things are likely more common and I wonder whether it could be part of her brains response to falling blood pressure.  The brevity of the events and the electrocardiographic abnormalities with bifascicular block both support the idea of a bradycardia arrhythmia.  The ISSUE trial previously had demonstrated that about 50% of patients who had "syncope "in this situation would have on loop monitoring a documented bradycardia arrhythmia to explain their recurring events.  Hence, I think such technology could be very useful in trying to elucidate the mechanism not withstanding the fact that  these episodes are not exactly "syncope ,"and that the loss of postural tone is not associated with an awareness of loss of consciousness.  It is well described in patients with carotid sinus hypersensitivity can have syncope witnessed and documented where in they swear that they did not pass out.  Hence, I think the lack of certainty as to whether she passed out or not should not dissuade [place]  2 other potential arrhythmic causes are noted on the monitor, the first is atrial fibrillation which could be associate with posttermination pausing in the others nonsustained ventricular tachycardia.  Her echocardiogram was normal and hence we will not pursue the mechanism of the nonsustained VT at this time.  She is scheduled to undergo EEG with Dr. Jannifer Franklin; I recommended the use of an event recorder as outlined above        Virl Axe

## 2021-01-23 ENCOUNTER — Ambulatory Visit (INDEPENDENT_AMBULATORY_CARE_PROVIDER_SITE_OTHER): Payer: Medicare Other | Admitting: Neurology

## 2021-01-23 DIAGNOSIS — R55 Syncope and collapse: Secondary | ICD-10-CM

## 2021-01-25 DIAGNOSIS — F3181 Bipolar II disorder: Secondary | ICD-10-CM | POA: Diagnosis not present

## 2021-01-29 ENCOUNTER — Telehealth: Payer: Self-pay | Admitting: Neurology

## 2021-01-29 NOTE — Procedures (Signed)
    History:  [name] [name] is a 75 year old patient with a history of bipolar disorder with a history of tar dive dyskinesia.  The patient has had multiple episodes of syncope or near syncope associated with dizziness.  The patient has had brief episodes of atrial fibrillation on a cardiac monitor without symptoms.  She is being evaluated for the syncopal events.  This is a routine EEG.  No skull defects are noted.  Medications include alprazolam, amlodipine, Lamictal, Metformin, multivitamins, Zoloft, Zocor, Ingrezza, and vitamin B12.  EEG classification: Normal awake  Description of the recording: The background rhythms of this recording consists of a fairly well modulated medium amplitude alpha rhythm of 11 Hz that is reactive to eye opening and closure.  At times, an overlying 12 or 13 Hz beta activity is seen in a generalized fashion.  As the record progresses, the patient appears to remain in the waking state throughout the recording. Photic stimulation was performed, resulting in a bilateral and symmetric photic driving response. Hyperventilation was also performed, resulting in a minimal buildup of the background rhythm activities without significant slowing seen. At no time during the recording does there appear to be evidence of spike or spike wave discharges or evidence of focal slowing. EKG monitor shows no evidence of cardiac rhythm abnormalities with a heart rate of 56.  Impression: This is a normal EEG recording in the waking state.  There is some evidence of beta activity which may be medication related, the patient is on alprazolam.  No evidence of ictal or interictal discharges are seen.

## 2021-01-29 NOTE — Telephone Encounter (Signed)
EEG study was unremarkable, no evidence of source of syncope seen.  The patient has seen cardiology for atrial fibrillation events.  The patient is seen Dr. Caryl Comes, the possibility of a loop recorder has been entertained, I discussed the results of the EEG study with the patient.

## 2021-02-01 DIAGNOSIS — F3181 Bipolar II disorder: Secondary | ICD-10-CM | POA: Diagnosis not present

## 2021-02-01 DIAGNOSIS — G2401 Drug induced subacute dyskinesia: Secondary | ICD-10-CM | POA: Diagnosis not present

## 2021-02-01 DIAGNOSIS — F5101 Primary insomnia: Secondary | ICD-10-CM | POA: Diagnosis not present

## 2021-02-01 DIAGNOSIS — F411 Generalized anxiety disorder: Secondary | ICD-10-CM | POA: Diagnosis not present

## 2021-02-22 DIAGNOSIS — F3181 Bipolar II disorder: Secondary | ICD-10-CM | POA: Diagnosis not present

## 2021-02-24 DIAGNOSIS — I472 Ventricular tachycardia: Secondary | ICD-10-CM | POA: Insufficient documentation

## 2021-02-24 DIAGNOSIS — I452 Bifascicular block: Secondary | ICD-10-CM | POA: Insufficient documentation

## 2021-02-24 DIAGNOSIS — I4729 Other ventricular tachycardia: Secondary | ICD-10-CM | POA: Insufficient documentation

## 2021-02-26 ENCOUNTER — Other Ambulatory Visit: Payer: Self-pay

## 2021-02-26 ENCOUNTER — Ambulatory Visit (INDEPENDENT_AMBULATORY_CARE_PROVIDER_SITE_OTHER): Payer: Medicare Other | Admitting: Internal Medicine

## 2021-02-26 ENCOUNTER — Encounter: Payer: Self-pay | Admitting: Internal Medicine

## 2021-02-26 VITALS — BP 120/80 | HR 63 | Ht 63.0 in | Wt 167.0 lb

## 2021-02-26 DIAGNOSIS — I4891 Unspecified atrial fibrillation: Secondary | ICD-10-CM

## 2021-02-26 DIAGNOSIS — I452 Bifascicular block: Secondary | ICD-10-CM | POA: Diagnosis not present

## 2021-02-26 DIAGNOSIS — R55 Syncope and collapse: Secondary | ICD-10-CM | POA: Diagnosis not present

## 2021-02-26 DIAGNOSIS — I472 Ventricular tachycardia: Secondary | ICD-10-CM

## 2021-02-26 DIAGNOSIS — I4729 Other ventricular tachycardia: Secondary | ICD-10-CM

## 2021-02-26 NOTE — Patient Instructions (Signed)
Medication Instructions:  Your physician recommends that you continue on your current medications as directed. Please refer to the Current Medication list given to you today.  *If you need a refill on your cardiac medications before your next appointment, please call your pharmacy*   Lab Work: None ordered.  If you have labs (blood work) drawn today and your tests are completely normal, you will receive your results only by: Marland Kitchen MyChart Message (if you have MyChart) OR . A paper copy in the mail If you have any lab test that is abnormal or we need to change your treatment, we will call you to review the results.   Testing/Procedures: None ordered.  Follow-Up: At [facility], you and your health needs are our priority.  As part of our continuing mission to provide you with exceptional heart care, we have created designated Provider Care Teams.  These Care Teams include your primary Cardiologist (physician) and Advanced Practice Providers (APPs -  Physician Assistants and Nurse Practitioners) who all work together to provide you with the care you need, when you need it.  We recommend signing up for the patient portal called "MyChart".  Sign up information is provided on this After Visit Summary.  MyChart is used to connect with patients for Virtual Visits (Telemedicine).  Patients are able to view lab/test results, encounter notes, upcoming appointments, etc.  Non-urgent messages can be sent to your provider as well.   To learn more about what you can do with MyChart, go to [email or web address].    Your next appointment:   Follow up as needed with Dr Caryl Comes  Other Instructions:  You may shower tomorrow afternoon 02/27/2021  You may remove the late bandage on Thursday or Friday of this week  Wound Care, Adult Taking care of your wound properly can help to prevent pain, infection, and scarring. It can also help your wound heal more quickly. Follow instructions from your health care  provider about how to care for your wound. Supplies needed:  Soap and water.  Wound cleanser.  Gauze.  If needed, a clean bandage (dressing) or other type of wound dressing material to cover or place in the wound. Follow your health care provider's instructions about what dressing supplies to use.  How to care for your wound Cleaning the wound Ask your health care provider how to clean the wound. This may include:  Using mild soap and water or a wound cleanser.  Using a clean gauze to pat the wound dry after cleaning it. Do not rub or scrub the wound. Dressing care  Wash your hands with soap and water for at least 20 seconds before and after you change the dressing. If soap and water are not available, use hand sanitizer.  Change your dressing as told by your health care provider. This may include: ? Cleaning or rinsing out (irrigating) the wound. ? Placing a dressing over the wound or in the wound (packing). ? Covering the wound with an outer dressing.  Leave any adhesive strips in place. These skin closures may need to stay in place for 2 weeks or longer. If adhesive strip edges start to loosen and curl up, you may trim the loose edges. Do not remove adhesive strips completely unless your health care provider tells you to do that.  Ask your health care provider when you can leave the wound uncovered. Checking for infection Check your wound area every day for signs of infection. Check for:  More redness, swelling, or pain.  Fluid or blood.  Warmth.  Pus or a bad smell.   Follow these instructions at home Medicines  If you were prescribed an antibiotic medicine, cream, or ointment, take or apply it as told by your health care provider. Do not stop using the antibiotic even if your condition improves.  If you were prescribed pain medicine, take it 30 minutes before you do any wound care or as told by your health care provider.  Take over-the-counter and prescription  medicines only as told by your health care provider. Eating and drinking  Eat a diet that includes protein, vitamin A, vitamin C, and other nutrient-rich foods to help the wound heal. ? Foods rich in protein include meat, fish, eggs, dairy, beans, and nuts. ? Foods rich in vitamin A include carrots and dark green, leafy vegetables. ? Foods rich in vitamin C include citrus fruits, tomatoes, broccoli, and peppers.  Drink enough fluid to keep your urine pale yellow. General instructions  Do not take baths, swim, use a hot tub, or do anything that would put the wound underwater until your health care provider approves. Ask your health care provider if you may take showers. You may only be allowed to take sponge baths.  Do not scratch or pick at the wound. Keep it covered as told by your health care provider.  Return to your normal activities as told by your health care provider. Ask your health care provider what activities are safe for you.  Protect your wound from the sun when you are outside for the first 6 months, or for as long as told by your health care provider. Cover up the scar area or apply sunscreen that has an SPF of at least 44.  Do not use any products that contain nicotine or tobacco, such as cigarettes, e-cigarettes, and chewing tobacco. These may delay wound healing. If you need help quitting, ask your health care provider.  Keep all follow-up visits as told by your health care provider. This is important. Contact a health care provider if:  You received a tetanus shot and you have swelling, severe pain, redness, or bleeding at the injection site.  Your pain is not controlled with medicine.  You have any of these signs of infection: ? More redness, swelling, or pain around the wound. ? Fluid or blood coming from the wound. ? Warmth coming from the wound. ? Pus or a bad smell coming from the wound. ? A fever or chills.  You are nauseous or you vomit.  You are  dizzy. Get help right away if:  You have a red streak of skin near the area around your wound.  Your wound has been closed with staples, sutures, skin glue, or adhesive strips and it begins to open up and separate.  Your wound is bleeding, and the bleeding does not stop with gentle pressure.  You have a rash.  You faint.  You have trouble breathing. These symptoms may represent a serious problem that is an emergency. Do not wait to see if the symptoms will go away. Get medical help right away. Call your local emergency services (911 in the [place] Do not drive yourself to the hospital. Summary  Always wash your hands with soap and water for at least 20 seconds before and after changing your dressing.  Change your dressing as told by your health care provider.  To help with healing, eat foods that are rich in protein, vitamin A, vitamin C, and other nutrients.  Check  your wound every day for signs of infection. Contact your health care provider if you suspect that your wound is infected. This information is not intended to replace advice given to you by your health care provider. Make sure you discuss any questions you have with your health care provider. Document Revised: 08/20/2019 Document Reviewed: 08/20/2019 Elsevier Patient Education  2021 [employer].

## 2021-02-26 NOTE — Progress Notes (Signed)
Patient Care Team: [name], MD as PCP - General (Internal Medicine) Daron Offer Richard Miu, MD as Referring Physician (Psychiatry)   HPI  DEAIRA [name] is a 75 y.o. female seen in follow-up for recurrent falls-abrupt in onset and offset associated with bodily injury.  She has a history of modest bradycardia and paroxysmal atrial fibrillation with bifascicular block.  Event recorder's were unrevealing as to the cause of falls.  She is here today for the implantation of a loop recorder  No interval events    DATE TEST EF   12/21  Echo  55-65 %         Date Cr K Hgb  12/21 1.18 3.6 11.7           Records and Results Reviewed   Past Medical History:  Diagnosis Date  . Allergic rhinitis   . Bipolar disorder ([place])   . Cholelithiasis    seen on CAT 01/04/09  . Chronic renal disease, stage III ([place])    3/12  . Complication of anesthesia    versed and fentanyl did not work for colonoscopy  . Diabetic peripheral neuropathy ([place]) 07/18/2020  . DM (diabetes mellitus) ([place])   . Edema   . Gait abnormality 08/26/2019  . HLD (hyperlipidemia)   . HTN (hypertension)   . Iron deficiency anemia    3/12   . Lymphedema    tarda  . Neuropathy   . Obstructive sleep apnea on CPAP   . Osteoarthritis   . Renal cyst    2.7 cm seen on CAT 01/04/09  . Tardive dyskinesia     Past Surgical History:  Procedure Laterality Date  . ABDOMINAL HYSTERECTOMY    . BALLOON DILATION N/A 04/24/2020   Procedure: BALLOON DILATION;  Surgeon: Ronnette Juniper, MD;  Location: [facility];  Service: Gastroenterology;  Laterality: N/A;  . BIOPSY  04/24/2020   Procedure: BIOPSY;  Surgeon: Ronnette Juniper, MD;  Location: [facility];  Service: Gastroenterology;;  . Lum Keas INJECTION N/A 04/24/2020   Procedure: POSSIBLE BOTOX INJECTION;  Surgeon: Ronnette Juniper, MD;  Location: [facility];  Service: Gastroenterology;  Laterality: N/A;  . COLONOSCOPY  2009; 07/09/11   2009:normal (polyp was not  adenoma); 2012:   . DILATION AND CURETTAGE OF UTERUS    . ESOPHAGEAL MANOMETRY N/A 05/31/2020   Procedure: ESOPHAGEAL MANOMETRY (EM);  Surgeon: Ronnette Juniper, MD;  Location: [facility];  Service: Gastroenterology;  Laterality: N/A;  . ESOPHAGOGASTRODUODENOSCOPY  07/09/11   Erosive gastritis  . ESOPHAGOGASTRODUODENOSCOPY (EGD) WITH PROPOFOL N/A 04/24/2020   Procedure: ESOPHAGOGASTRODUODENOSCOPY (EGD) WITH PROPOFOL;  Surgeon: Ronnette Juniper, MD;  Location: [facility];  Service: Gastroenterology;  Laterality: N/A;  . FRACTURE SURGERY     rt fx upper arm  . GIVENS CAPSULE STUDY  07/25/2011   normal small bowel  . REIMPLANT URETER IN BLADDER  1996  . TRIGGER FINGER RELEASE Left 02/04/2013   Procedure: RELEASE TRIGGER FINGER/A-1 PULLEY LEFT RING FINGER;  Surgeon: Tennis Must, MD;  Location: [facility];  Service: Orthopedics;  Laterality: Left;    Current Meds  Medication Sig  . ALPRAZolam (XANAX) 1 MG tablet Take 1 mg by mouth 2 (two) times daily as needed for anxiety or sleep.  Marland Kitchen amLODipine (NORVASC) 5 MG tablet Take 5 mg by mouth at bedtime.   . lamoTRIgine (LAMICTAL) 200 MG tablet Take 200 mg by mouth 2 (two) times daily.  . Multiple Vitamins-Minerals (MULTIVITAMIN WITH MINERALS) tablet Take 1 tablet by mouth  daily.  . sertraline (ZOLOFT) 100 MG tablet Take 100 mg by mouth 2 (two) times daily.  . simvastatin (ZOCOR) 40 MG tablet Take 40 mg by mouth at bedtime.   Minus Liberty Tosylate (INGREZZA) 40 MG CAPS Take 40 mg by mouth at bedtime.    Allergies  Allergen Reactions  . Latuda [Lurasidone] Other (See Comments)    Increased suicidal thoughts   . Abilify [Aripiprazole] Other (See Comments)    Tardive Dyskinesia  . Neosporin Plus Max St   . Seroquel [Quetiapine] Other (See Comments)    Tardive Dyskinesia      Review of Systems negative except from HPI and PMH  Physical Exam Pulse 63   Ht 5\' 3"  (1.6 m)   Wt 167 lb (75.8 kg)   SpO2 98%   BMI 29.58 kg/m  Well  developed and well nourished in no acute distress HENT normal E scleral and icterus clear Neck Supplel Clear to ausculation  Regular rate and rhythm, no murmurs gallops or rub Soft  No clubbing cyanosis  Edema Alert and oriented, grossly normal motor and sensory function Skin Warm and Dry  ECG  Sinus @ 63 18/13/45 Axis - 47  CrCl cannot be calculated (Patient's most recent lab result is older than the maximum 21 days allowed.).   Assessment and  Plan  Falls  Bifascicular block  Ventricular tachycardia-nonsustained  Atrial fibrillation-non sustained SCAF  Tardive dyskinesia  Reviewed hx  No afib  Balance an issue  For loop recorder today  .Pre op Dx Cryptogenic Stroke  Post op Dx Same  Procedure  Loop Recorder implantation  After routine prep and drape of the left parasternal area, a small incision was created. A Med[employer]onic LINQ Reveal Loop Recorder  Serial Number  [id number] was inserted.    SteriStrip dressing was  applied.  The patient tolerated the procedure without apparent complication.  EBL < 10cc     Current medicines are reviewed at length with the patient today .  The patient does not   have concerns regarding medicines.

## 2021-03-13 DIAGNOSIS — F3181 Bipolar II disorder: Secondary | ICD-10-CM | POA: Diagnosis not present

## 2021-03-27 DIAGNOSIS — F5101 Primary insomnia: Secondary | ICD-10-CM | POA: Diagnosis not present

## 2021-03-27 DIAGNOSIS — F411 Generalized anxiety disorder: Secondary | ICD-10-CM | POA: Diagnosis not present

## 2021-03-27 DIAGNOSIS — G2401 Drug induced subacute dyskinesia: Secondary | ICD-10-CM | POA: Diagnosis not present

## 2021-03-27 DIAGNOSIS — F3181 Bipolar II disorder: Secondary | ICD-10-CM | POA: Diagnosis not present

## 2021-03-29 ENCOUNTER — Ambulatory Visit
Admission: RE | Admit: 2021-03-29 | Discharge: 2021-03-29 | Disposition: A | Discharge status: Home / Self Care | Payer: Medicare Other | Source: Ambulatory Visit | Attending: Geriatric Medicine | Admitting: Geriatric Medicine

## 2021-03-29 ENCOUNTER — Other Ambulatory Visit: Payer: Self-pay

## 2021-03-29 DIAGNOSIS — Z1231 Encounter for screening mammogram for malignant neoplasm of breast: Secondary | ICD-10-CM

## 2021-03-29 DIAGNOSIS — E2839 Other primary ovarian failure: Secondary | ICD-10-CM

## 2021-03-29 DIAGNOSIS — Z780 Asymptomatic menopausal state: Secondary | ICD-10-CM | POA: Diagnosis not present

## 2021-04-02 ENCOUNTER — Other Ambulatory Visit: Payer: Self-pay | Admitting: Geriatric Medicine

## 2021-04-02 ENCOUNTER — Ambulatory Visit (INDEPENDENT_AMBULATORY_CARE_PROVIDER_SITE_OTHER): Payer: Medicare Other

## 2021-04-02 DIAGNOSIS — N63 Unspecified lump in unspecified breast: Secondary | ICD-10-CM

## 2021-04-02 DIAGNOSIS — R55 Syncope and collapse: Secondary | ICD-10-CM

## 2021-04-04 LAB — CUP PACEART REMOTE DEVICE CHECK
Date Time Interrogation Session: 20220515195539
Implantable Pulse Generator Implant Date: 20220411

## 2021-04-05 ENCOUNTER — Ambulatory Visit
Admission: RE | Admit: 2021-04-05 | Discharge: 2021-04-05 | Disposition: A | Discharge status: Home / Self Care | Payer: Medicare Other | Source: Ambulatory Visit | Attending: Geriatric Medicine | Admitting: Geriatric Medicine

## 2021-04-05 ENCOUNTER — Other Ambulatory Visit: Payer: Self-pay

## 2021-04-05 DIAGNOSIS — N63 Unspecified lump in unspecified breast: Secondary | ICD-10-CM

## 2021-04-05 DIAGNOSIS — N6322 Unspecified lump in the left breast, upper inner quadrant: Secondary | ICD-10-CM | POA: Diagnosis not present

## 2021-04-05 DIAGNOSIS — R922 Inconclusive mammogram: Secondary | ICD-10-CM | POA: Diagnosis not present

## 2021-04-05 DIAGNOSIS — R928 Other abnormal and inconclusive findings on diagnostic imaging of breast: Secondary | ICD-10-CM | POA: Diagnosis not present

## 2021-04-05 IMAGING — MG DIGITAL DIAGNOSTIC BILAT W/ TOMO W/ CAD
6 of 10 series · 6 of 30 positions shown · non-contrast
Comparison: Previous exam(s).

CLINICAL DATA: Patient presents for palpable abnormality within the
left breast.

EXAM:
DIGITAL DIAGNOSTIC BILATERAL MAMMOGRAM WITH TOMOSYNTHESIS AND CAD;
ULTRASOUND LEFT BREAST LIMITED
TECHNIQUE: Bilateral digital diagnostic mammography and breast tomosynthesis
was performed. The images were evaluated with computer-aided
detection.; Targeted ultrasound examination of the left breast was
performed

[L CC synth-2D]
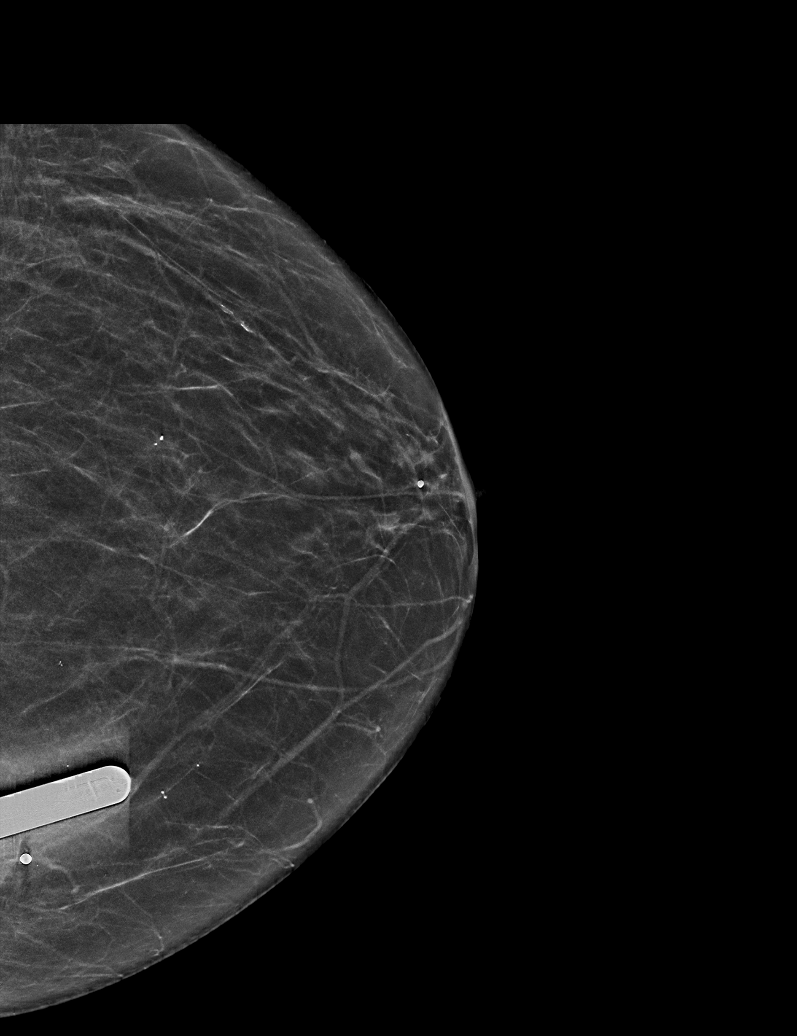

[L MLO synth-2D]
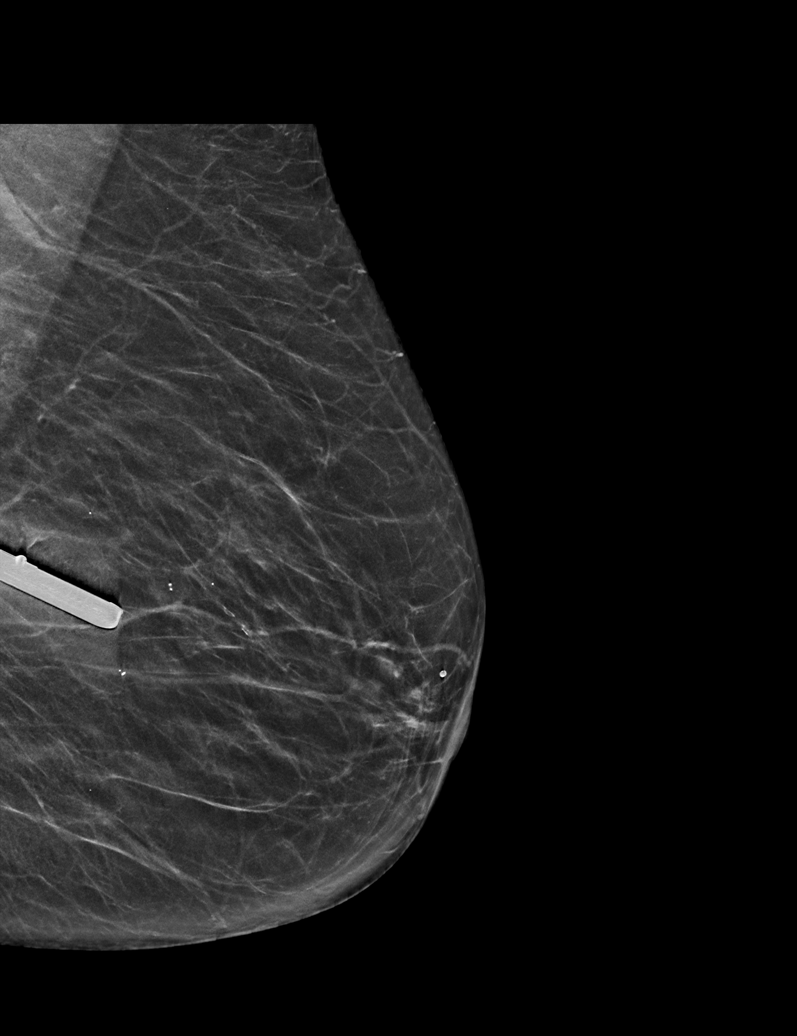

[R MLO synth-2D]
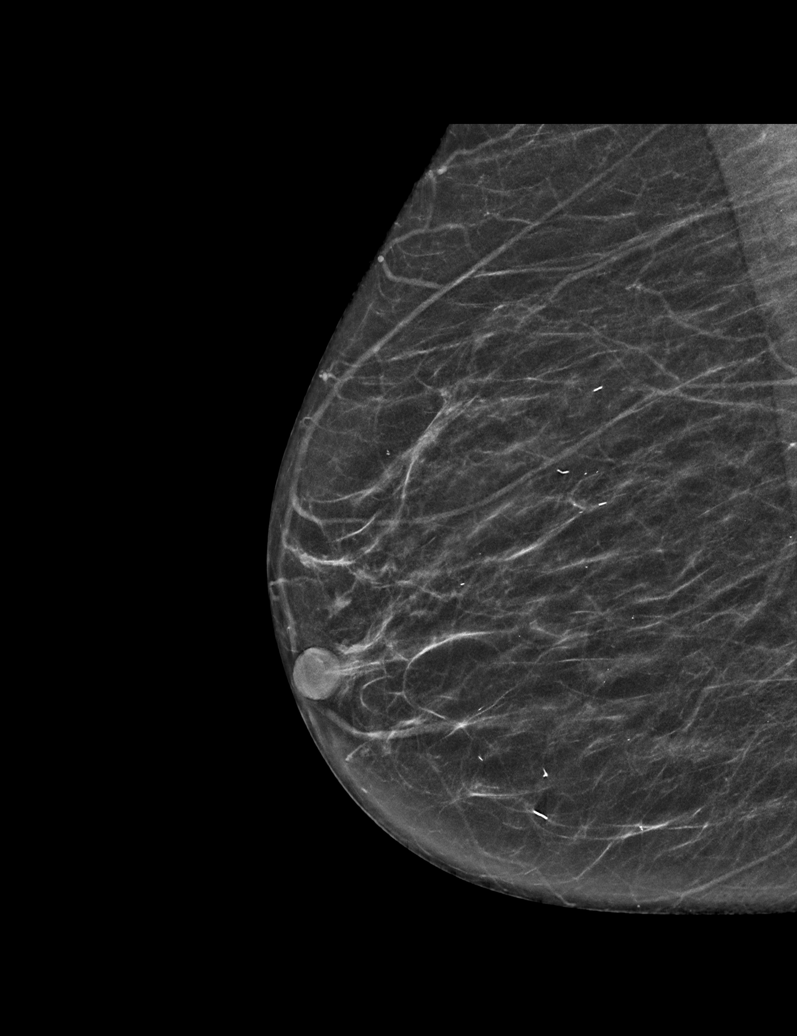

[R CC synth-2D]
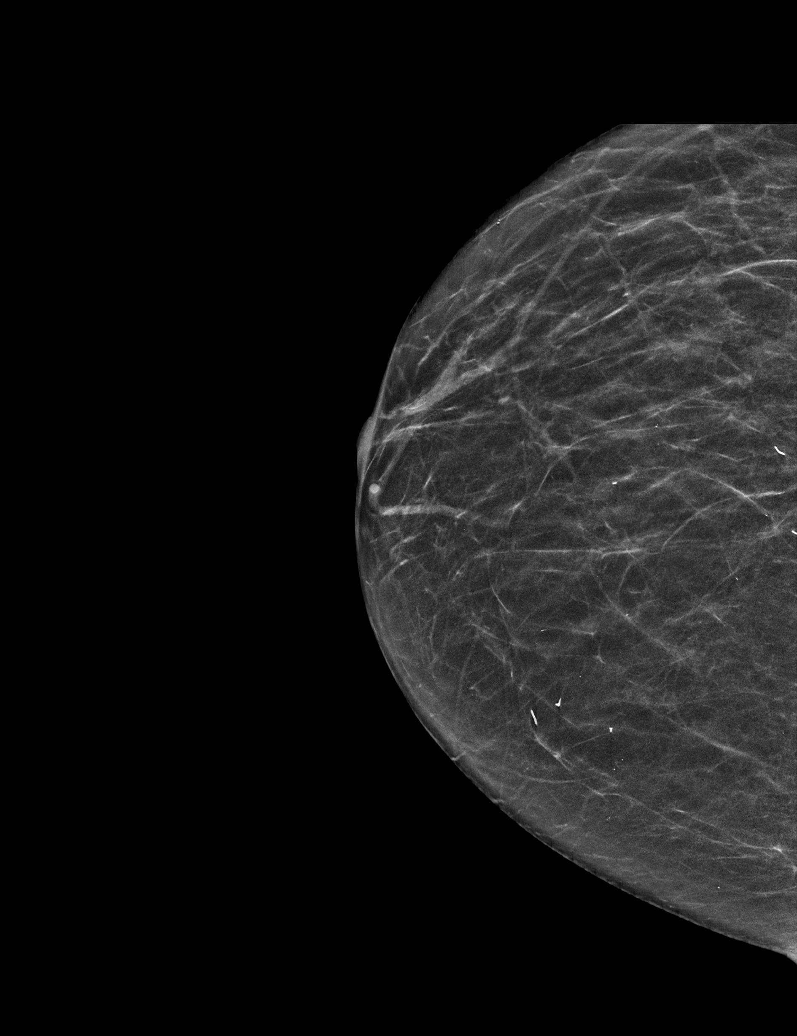

[L TAN synth-2D]
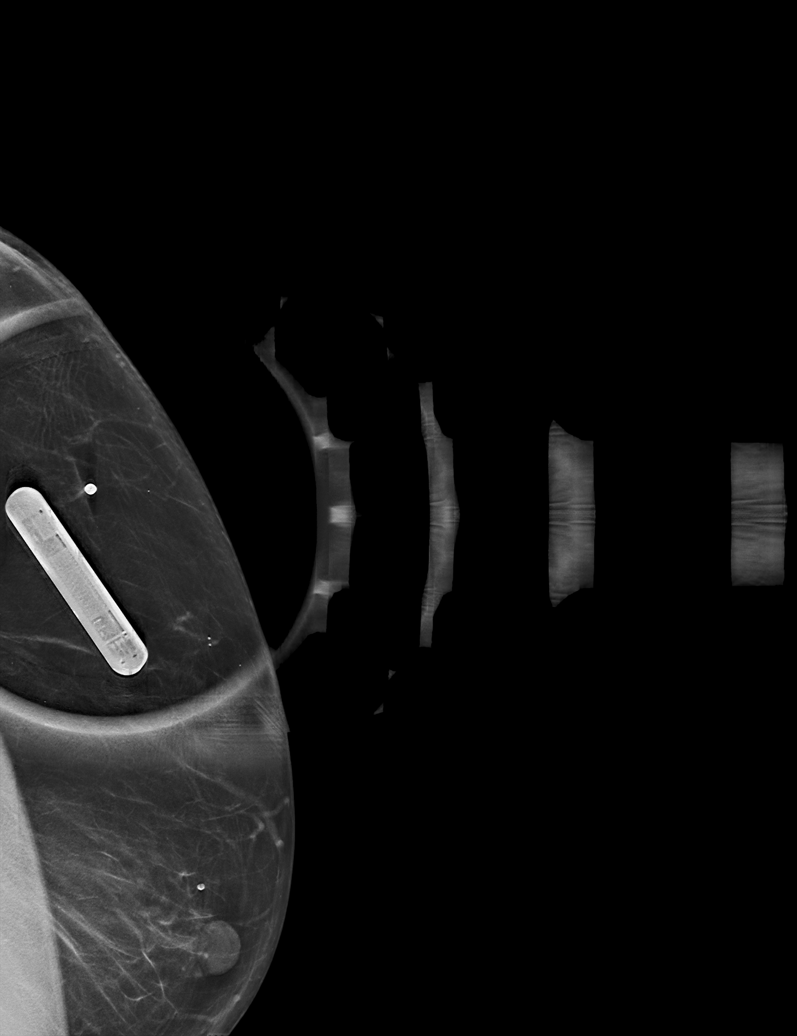

[L CC tomo · tomo slice 25/49.0]
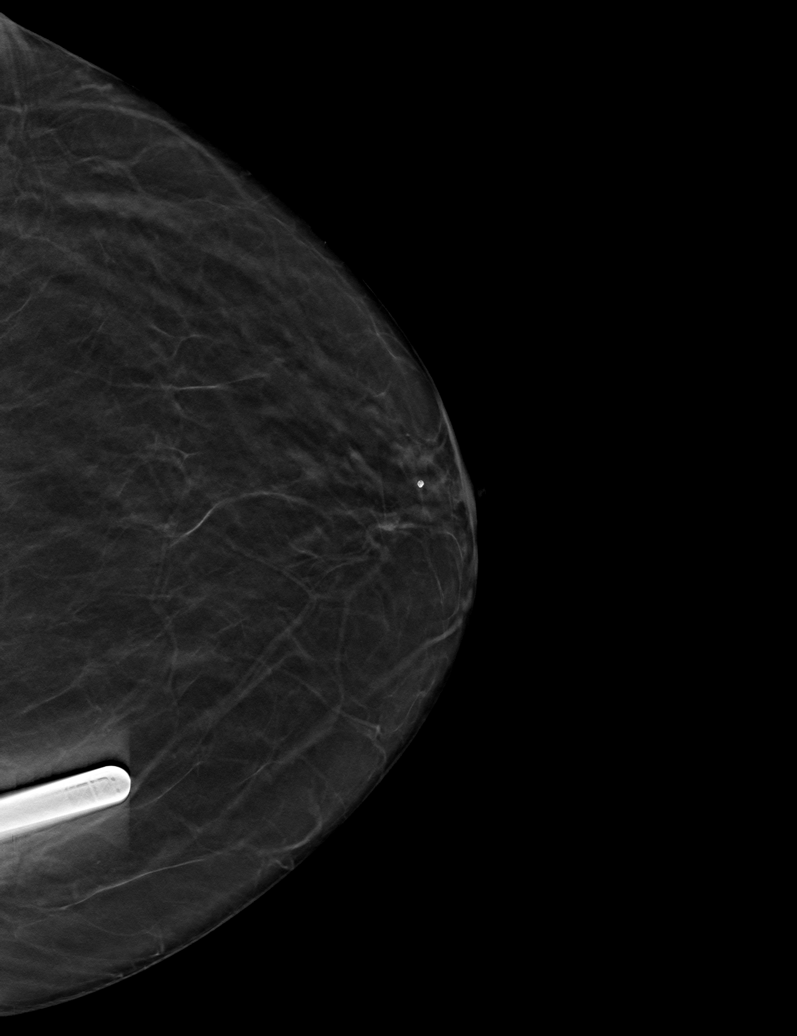

[6 of 30 positions shown; findings below may reference images not displayed]

ACR Breast Density Category b: There are scattered areas of
fibroglandular density.
FINDINGS: No concerning masses, calcifications or nonsurgical distortion
identified within either breast.

On physical exam, there is a small red lesion within the skin of the
upper inner left breast.

Targeted ultrasound is performed, showing normal tissue without
suspicious mass within the upper inner left breast at the site of
palpable concern. Additionally, there is a small cutaneous lesion at
the site of palpable concern 10 o'clock position 12 cm from nipple.
IMPRESSION: Palpable abnormality left breast corresponds with a cutaneous
lesion.

No mammographic evidence for malignancy.

RECOMMENDATION:
Continued clinical evaluation for left breast skin lesion.

Screening mammogram in one year.(Code:[OW])

I have discussed the findings and recommendations with the patient.
If applicable, a reminder letter will be sent to the patient
regarding the next appointment.

BI-RADS CATEGORY  2: Benign.

## 2021-04-05 IMAGING — US US BREAST*L* LIMITED INC AXILLA
1 series · 8 of 8 positions shown · non-contrast
Comparison: Previous exam(s).

CLINICAL DATA: Patient presents for palpable abnormality within the
left breast.

EXAM:
DIGITAL DIAGNOSTIC BILATERAL MAMMOGRAM WITH TOMOSYNTHESIS AND CAD;
ULTRASOUND LEFT BREAST LIMITED
TECHNIQUE: Bilateral digital diagnostic mammography and breast tomosynthesis
was performed. The images were evaluated with computer-aided
detection.; Targeted ultrasound examination of the left breast was
performed

[Series 1: us breast*left* limited inc axilla · 0.03mm/px · 8 of 8 slices shown]
[im 1/8]
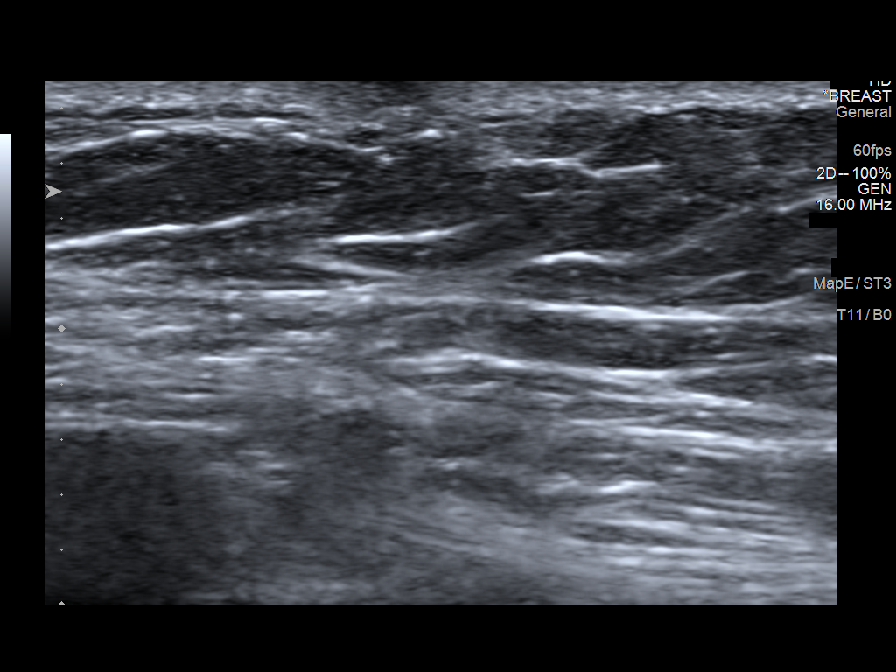
[im 2/8]
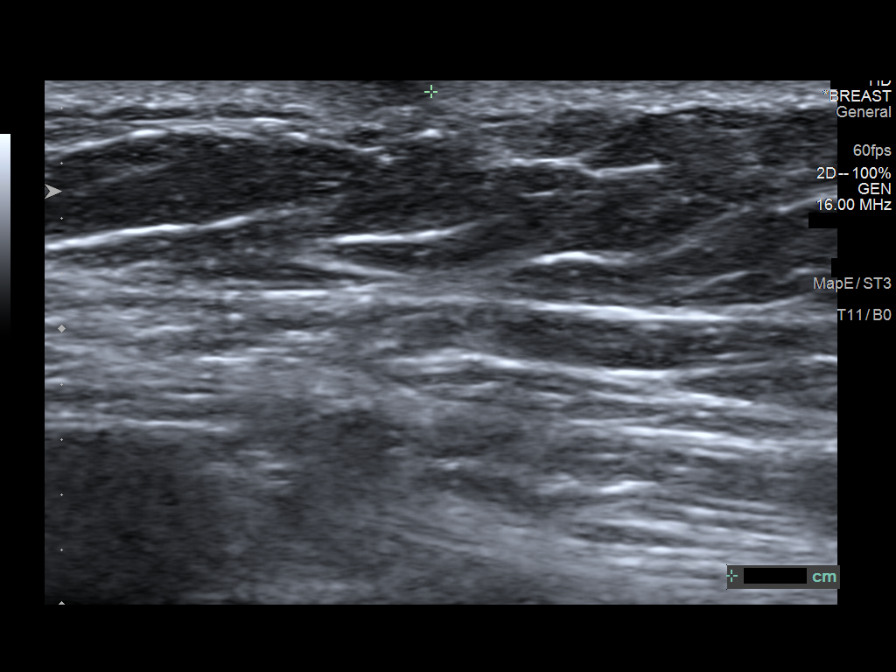
[im 3/8]
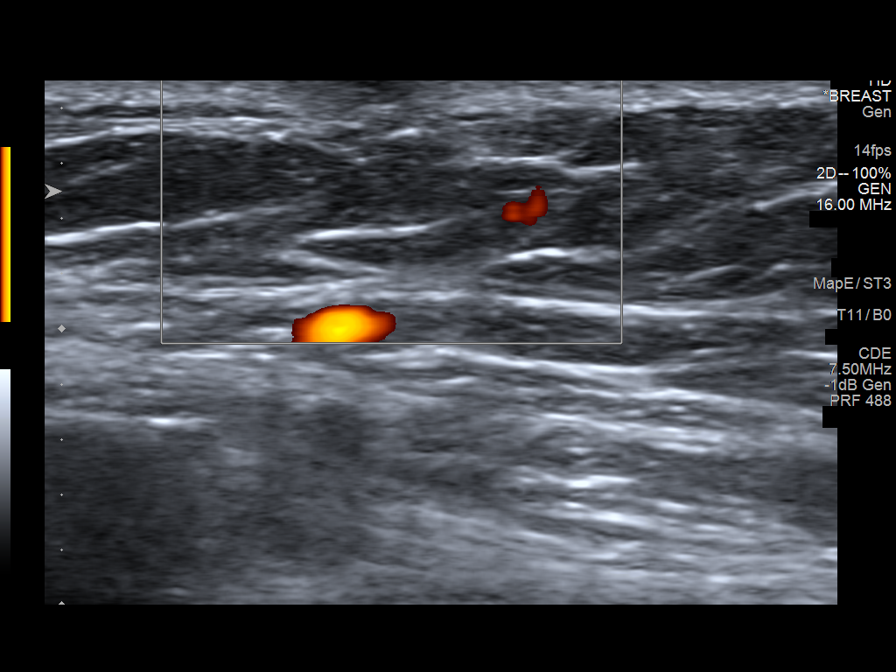
[im 4/8]
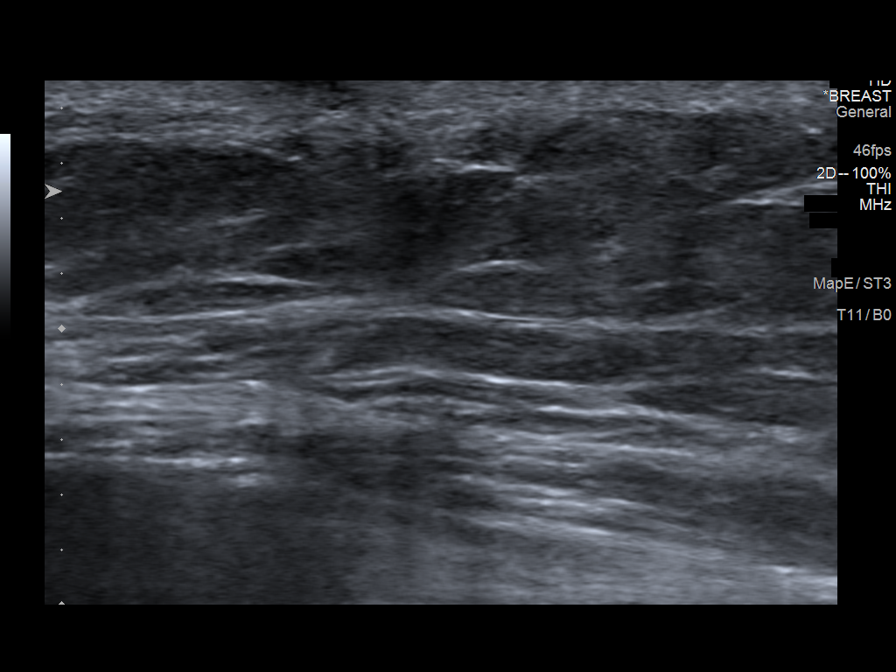
[im 5/8]
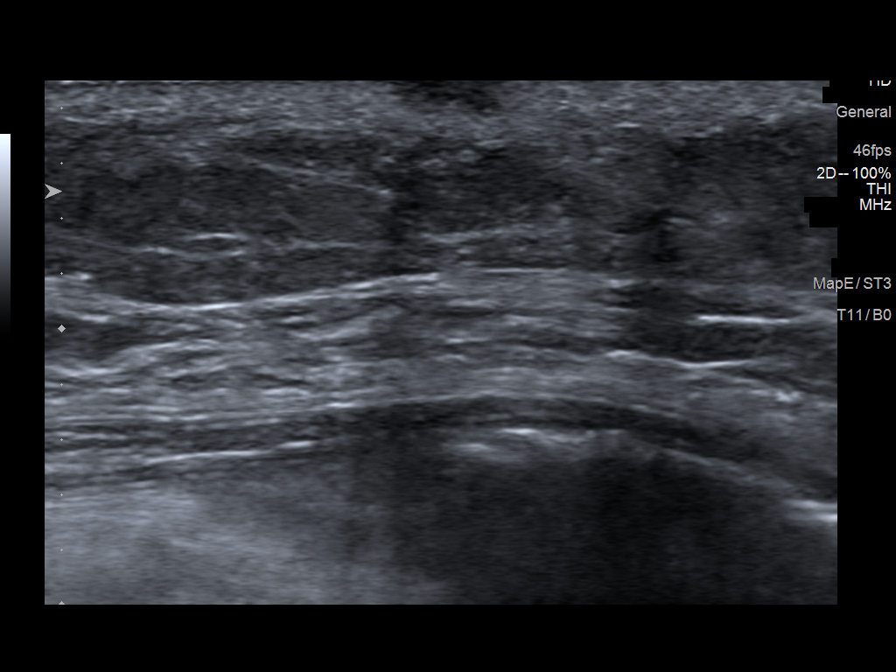
[im 6/8]
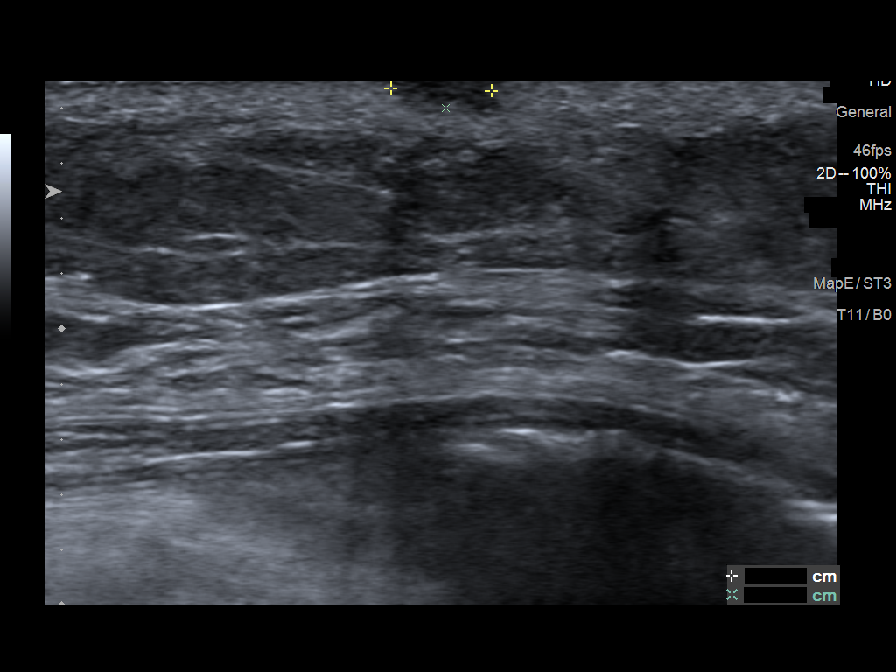
[im 7/8]
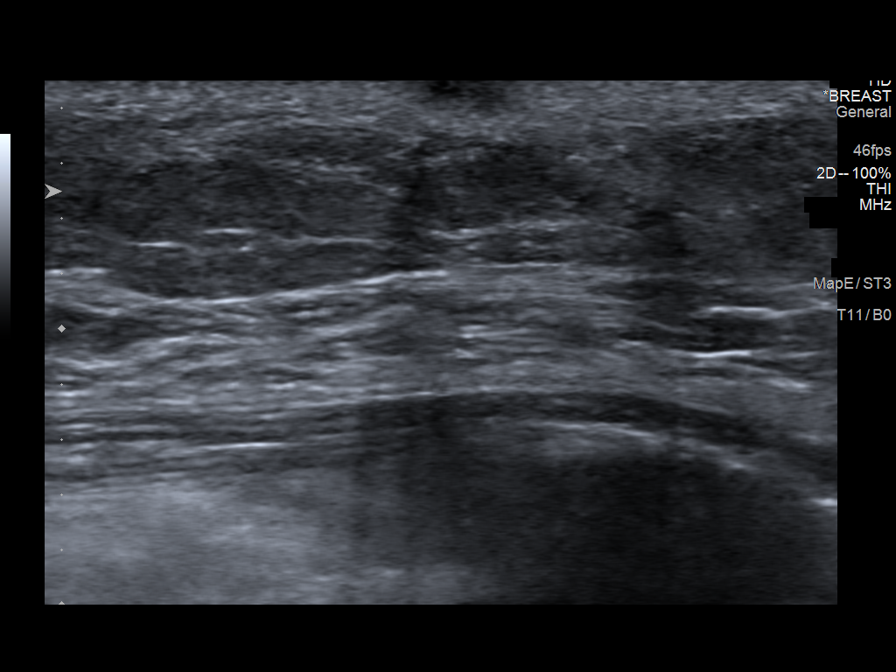
[im 8/8]
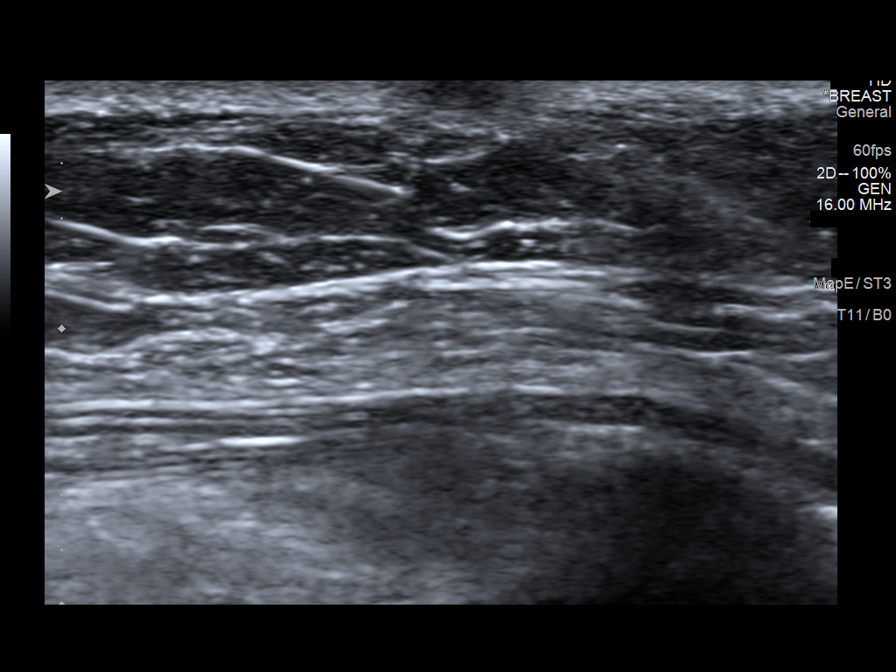

[8 of 8 positions shown; findings below may reference images not displayed]

ACR Breast Density Category b: There are scattered areas of
fibroglandular density.
FINDINGS: No concerning masses, calcifications or nonsurgical distortion
identified within either breast.

On physical exam, there is a small red lesion within the skin of the
upper inner left breast.

Targeted ultrasound is performed, showing normal tissue without
suspicious mass within the upper inner left breast at the site of
palpable concern. Additionally, there is a small cutaneous lesion at
the site of palpable concern 10 o'clock position 12 cm from nipple.
IMPRESSION: Palpable abnormality left breast corresponds with a cutaneous
lesion.

No mammographic evidence for malignancy.

RECOMMENDATION:
Continued clinical evaluation for left breast skin lesion.

Screening mammogram in one year.(Code:[OW])

I have discussed the findings and recommendations with the patient.
If applicable, a reminder letter will be sent to the patient
regarding the next appointment.

BI-RADS CATEGORY  2: Benign.

## 2021-04-10 DIAGNOSIS — F3181 Bipolar II disorder: Secondary | ICD-10-CM | POA: Diagnosis not present

## 2021-04-25 NOTE — Progress Notes (Signed)
[facility] Summary Report / Loop Recorder 

## 2021-04-26 DIAGNOSIS — Z23 Encounter for immunization: Secondary | ICD-10-CM | POA: Diagnosis not present

## 2021-05-07 ENCOUNTER — Ambulatory Visit (INDEPENDENT_AMBULATORY_CARE_PROVIDER_SITE_OTHER): Payer: Medicare Other

## 2021-05-07 DIAGNOSIS — I4891 Unspecified atrial fibrillation: Secondary | ICD-10-CM

## 2021-05-07 LAB — CUP PACEART REMOTE DEVICE CHECK
Date Time Interrogation Session: 20220617195309
Implantable Pulse Generator Implant Date: 20220411

## 2021-05-10 DIAGNOSIS — F3181 Bipolar II disorder: Secondary | ICD-10-CM | POA: Diagnosis not present

## 2021-05-28 NOTE — Progress Notes (Signed)
[facility] Summary Report / Loop Recorder 

## 2021-06-04 ENCOUNTER — Other Ambulatory Visit: Payer: Self-pay | Admitting: Geriatric Medicine

## 2021-06-04 ENCOUNTER — Ambulatory Visit
Admission: RE | Admit: 2021-06-04 | Discharge: 2021-06-04 | Disposition: A | Discharge status: Home / Self Care | Payer: Medicare Other | Source: Ambulatory Visit | Attending: Geriatric Medicine | Admitting: Geriatric Medicine

## 2021-06-04 DIAGNOSIS — E1122 Type 2 diabetes mellitus with diabetic chronic kidney disease: Secondary | ICD-10-CM | POA: Diagnosis not present

## 2021-06-04 DIAGNOSIS — E1142 Type 2 diabetes mellitus with diabetic polyneuropathy: Secondary | ICD-10-CM | POA: Diagnosis not present

## 2021-06-04 DIAGNOSIS — R059 Cough, unspecified: Secondary | ICD-10-CM

## 2021-06-04 DIAGNOSIS — N1831 Chronic kidney disease, stage 3a: Secondary | ICD-10-CM | POA: Diagnosis not present

## 2021-06-04 DIAGNOSIS — I700 Atherosclerosis of aorta: Secondary | ICD-10-CM | POA: Diagnosis not present

## 2021-06-04 DIAGNOSIS — R269 Unspecified abnormalities of gait and mobility: Secondary | ICD-10-CM | POA: Diagnosis not present

## 2021-06-04 DIAGNOSIS — Z79899 Other long term (current) drug therapy: Secondary | ICD-10-CM | POA: Diagnosis not present

## 2021-06-04 DIAGNOSIS — E7800 Pure hypercholesterolemia, unspecified: Secondary | ICD-10-CM | POA: Diagnosis not present

## 2021-06-04 DIAGNOSIS — I129 Hypertensive chronic kidney disease with stage 1 through stage 4 chronic kidney disease, or unspecified chronic kidney disease: Secondary | ICD-10-CM | POA: Diagnosis not present

## 2021-06-04 DIAGNOSIS — R5383 Other fatigue: Secondary | ICD-10-CM | POA: Diagnosis not present

## 2021-06-04 DIAGNOSIS — F3132 Bipolar disorder, current episode depressed, moderate: Secondary | ICD-10-CM | POA: Diagnosis not present

## 2021-06-04 DIAGNOSIS — E1121 Type 2 diabetes mellitus with diabetic nephropathy: Secondary | ICD-10-CM | POA: Diagnosis not present

## 2021-06-07 DIAGNOSIS — F3181 Bipolar II disorder: Secondary | ICD-10-CM | POA: Diagnosis not present

## 2021-06-07 LAB — CUP PACEART REMOTE DEVICE CHECK
Date Time Interrogation Session: 20220720195027
Implantable Pulse Generator Implant Date: 20220411

## 2021-06-11 ENCOUNTER — Ambulatory Visit (INDEPENDENT_AMBULATORY_CARE_PROVIDER_SITE_OTHER): Payer: Medicare Other

## 2021-06-11 DIAGNOSIS — R55 Syncope and collapse: Secondary | ICD-10-CM

## 2021-07-03 DIAGNOSIS — F3181 Bipolar II disorder: Secondary | ICD-10-CM | POA: Diagnosis not present

## 2021-07-06 NOTE — Progress Notes (Signed)
[facility] Summary Report / Loop Recorder 

## 2021-07-16 ENCOUNTER — Ambulatory Visit (INDEPENDENT_AMBULATORY_CARE_PROVIDER_SITE_OTHER): Payer: Medicare Other

## 2021-07-16 DIAGNOSIS — I4891 Unspecified atrial fibrillation: Secondary | ICD-10-CM

## 2021-07-17 DIAGNOSIS — F5101 Primary insomnia: Secondary | ICD-10-CM | POA: Diagnosis not present

## 2021-07-17 DIAGNOSIS — F3181 Bipolar II disorder: Secondary | ICD-10-CM | POA: Diagnosis not present

## 2021-07-17 DIAGNOSIS — F411 Generalized anxiety disorder: Secondary | ICD-10-CM | POA: Diagnosis not present

## 2021-07-17 DIAGNOSIS — G2401 Drug induced subacute dyskinesia: Secondary | ICD-10-CM | POA: Diagnosis not present

## 2021-07-17 LAB — CUP PACEART REMOTE DEVICE CHECK
Date Time Interrogation Session: 20220822195431
Implantable Pulse Generator Implant Date: 20220411

## 2021-07-30 NOTE — Progress Notes (Signed)
[facility] Summary Report / Loop Recorder 

## 2021-08-02 DIAGNOSIS — F3181 Bipolar II disorder: Secondary | ICD-10-CM | POA: Diagnosis not present

## 2021-08-20 ENCOUNTER — Ambulatory Visit (INDEPENDENT_AMBULATORY_CARE_PROVIDER_SITE_OTHER): Payer: Medicare Other

## 2021-08-20 DIAGNOSIS — I4891 Unspecified atrial fibrillation: Secondary | ICD-10-CM

## 2021-08-20 LAB — CUP PACEART REMOTE DEVICE CHECK
Date Time Interrogation Session: 20220924195215
Implantable Pulse Generator Implant Date: 20220411

## 2021-08-28 NOTE — Progress Notes (Signed)
[facility] Summary Report / Loop Recorder 

## 2021-09-04 DIAGNOSIS — Z23 Encounter for immunization: Secondary | ICD-10-CM | POA: Diagnosis not present

## 2021-09-06 DIAGNOSIS — F3181 Bipolar II disorder: Secondary | ICD-10-CM | POA: Diagnosis not present

## 2021-09-06 DIAGNOSIS — M25512 Pain in left shoulder: Secondary | ICD-10-CM | POA: Diagnosis not present

## 2021-09-19 LAB — CUP PACEART REMOTE DEVICE CHECK
Date Time Interrogation Session: 20221027195210
Implantable Pulse Generator Implant Date: 20220411

## 2021-09-24 ENCOUNTER — Ambulatory Visit (INDEPENDENT_AMBULATORY_CARE_PROVIDER_SITE_OTHER): Payer: Medicare Other

## 2021-09-24 DIAGNOSIS — R55 Syncope and collapse: Secondary | ICD-10-CM | POA: Diagnosis not present

## 2021-10-02 NOTE — Progress Notes (Signed)
[facility] Summary Report / Loop Recorder 

## 2021-10-04 DIAGNOSIS — F3181 Bipolar II disorder: Secondary | ICD-10-CM | POA: Diagnosis not present

## 2021-10-23 DIAGNOSIS — F3181 Bipolar II disorder: Secondary | ICD-10-CM | POA: Diagnosis not present

## 2021-10-29 ENCOUNTER — Ambulatory Visit (INDEPENDENT_AMBULATORY_CARE_PROVIDER_SITE_OTHER): Payer: Medicare Other

## 2021-10-29 DIAGNOSIS — R55 Syncope and collapse: Secondary | ICD-10-CM | POA: Diagnosis not present

## 2021-10-30 DIAGNOSIS — F3181 Bipolar II disorder: Secondary | ICD-10-CM | POA: Diagnosis not present

## 2021-10-30 LAB — CUP PACEART REMOTE DEVICE CHECK
Date Time Interrogation Session: 20221129195336
Implantable Pulse Generator Implant Date: 20220411

## 2021-11-08 NOTE — Progress Notes (Signed)
[facility] Summary Report / Loop Recorder 

## 2021-11-20 ENCOUNTER — Ambulatory Visit (INDEPENDENT_AMBULATORY_CARE_PROVIDER_SITE_OTHER): Payer: Medicare Other

## 2021-11-20 DIAGNOSIS — R55 Syncope and collapse: Secondary | ICD-10-CM | POA: Diagnosis not present

## 2021-11-21 LAB — CUP PACEART REMOTE DEVICE CHECK
Date Time Interrogation Session: 20230101195603
Implantable Pulse Generator Implant Date: 20220411

## 2021-11-27 DIAGNOSIS — F3181 Bipolar II disorder: Secondary | ICD-10-CM | POA: Diagnosis not present

## 2021-11-30 NOTE — Progress Notes (Signed)
[facility] Summary Report / Loop Recorder 

## 2021-12-11 DIAGNOSIS — F3181 Bipolar II disorder: Secondary | ICD-10-CM | POA: Diagnosis not present

## 2021-12-14 DIAGNOSIS — R269 Unspecified abnormalities of gait and mobility: Secondary | ICD-10-CM | POA: Diagnosis not present

## 2021-12-14 DIAGNOSIS — E1121 Type 2 diabetes mellitus with diabetic nephropathy: Secondary | ICD-10-CM | POA: Diagnosis not present

## 2021-12-14 DIAGNOSIS — Z79899 Other long term (current) drug therapy: Secondary | ICD-10-CM | POA: Diagnosis not present

## 2021-12-14 DIAGNOSIS — R059 Cough, unspecified: Secondary | ICD-10-CM | POA: Diagnosis not present

## 2021-12-14 DIAGNOSIS — N1831 Chronic kidney disease, stage 3a: Secondary | ICD-10-CM | POA: Diagnosis not present

## 2021-12-14 DIAGNOSIS — I129 Hypertensive chronic kidney disease with stage 1 through stage 4 chronic kidney disease, or unspecified chronic kidney disease: Secondary | ICD-10-CM | POA: Diagnosis not present

## 2021-12-14 DIAGNOSIS — R531 Weakness: Secondary | ICD-10-CM | POA: Diagnosis not present

## 2021-12-14 DIAGNOSIS — M546 Pain in thoracic spine: Secondary | ICD-10-CM | POA: Diagnosis not present

## 2021-12-14 DIAGNOSIS — F3132 Bipolar disorder, current episode depressed, moderate: Secondary | ICD-10-CM | POA: Diagnosis not present

## 2021-12-14 DIAGNOSIS — R197 Diarrhea, unspecified: Secondary | ICD-10-CM | POA: Diagnosis not present

## 2021-12-24 ENCOUNTER — Ambulatory Visit (INDEPENDENT_AMBULATORY_CARE_PROVIDER_SITE_OTHER): Payer: Medicare Other

## 2021-12-24 DIAGNOSIS — R55 Syncope and collapse: Secondary | ICD-10-CM

## 2021-12-25 LAB — CUP PACEART REMOTE DEVICE CHECK
Date Time Interrogation Session: 20230205230224
Implantable Pulse Generator Implant Date: 20220411

## 2021-12-27 NOTE — Progress Notes (Signed)
[facility] Summary Report / Loop Recorder 

## 2022-01-08 DIAGNOSIS — F3181 Bipolar II disorder: Secondary | ICD-10-CM | POA: Diagnosis not present

## 2022-01-08 DIAGNOSIS — F411 Generalized anxiety disorder: Secondary | ICD-10-CM | POA: Diagnosis not present

## 2022-01-08 DIAGNOSIS — F5101 Primary insomnia: Secondary | ICD-10-CM | POA: Diagnosis not present

## 2022-01-08 DIAGNOSIS — G2401 Drug induced subacute dyskinesia: Secondary | ICD-10-CM | POA: Diagnosis not present

## 2022-01-10 DIAGNOSIS — F3181 Bipolar II disorder: Secondary | ICD-10-CM | POA: Diagnosis not present

## 2022-01-16 ENCOUNTER — Ambulatory Visit (HOSPITAL_BASED_OUTPATIENT_CLINIC_OR_DEPARTMENT_OTHER): Payer: Medicare Other | Admitting: Physical Therapy

## 2022-01-18 ENCOUNTER — Other Ambulatory Visit: Payer: Self-pay

## 2022-01-18 ENCOUNTER — Ambulatory Visit (HOSPITAL_BASED_OUTPATIENT_CLINIC_OR_DEPARTMENT_OTHER): Payer: Medicare Other | Attending: Geriatric Medicine | Admitting: Physical Therapy

## 2022-01-18 ENCOUNTER — Encounter (HOSPITAL_BASED_OUTPATIENT_CLINIC_OR_DEPARTMENT_OTHER): Payer: Self-pay | Admitting: Physical Therapy

## 2022-01-18 DIAGNOSIS — R296 Repeated falls: Secondary | ICD-10-CM | POA: Insufficient documentation

## 2022-01-18 DIAGNOSIS — M6281 Muscle weakness (generalized): Secondary | ICD-10-CM | POA: Insufficient documentation

## 2022-01-18 DIAGNOSIS — R2689 Other abnormalities of gait and mobility: Secondary | ICD-10-CM | POA: Diagnosis not present

## 2022-01-18 NOTE — Therapy (Addendum)
OUTPATIENT PHYSICAL THERAPY LOWER EXTREMITY EVALUATION   Patient Name: [name] [name] MRN: [id number] DOB:10-21-1946, 76 y.o., female Today's Date: 01/19/2022   PT End of Session - 01/18/22 1459     Visit Number 1    Number of Visits 16    Date for PT Re-Evaluation 03/01/22    Authorization Type MCR a and B progress note at 10 Kx at 41    PT Start Time 9675    PT Stop Time 1232    PT Time Calculation (min) 47 min    Activity Tolerance Patient tolerated treatment well    Behavior During Therapy [facility] for tasks assessed/performed             Past Medical History:  Diagnosis Date   Allergic rhinitis    Bipolar disorder ([place])    Cholelithiasis    seen on CAT 01/04/09   Chronic renal disease, stage III ([place])    9/16   Complication of anesthesia    versed and fentanyl did not work for colonoscopy   Diabetic peripheral neuropathy ([place]) 07/18/2020   DM (diabetes mellitus) ([place])    Edema    Gait abnormality 08/26/2019   HLD (hyperlipidemia)    HTN (hypertension)    Iron deficiency anemia    3/12    Lymphedema    tarda   Neuropathy    Obstructive sleep apnea on CPAP    Osteoarthritis    Renal cyst    2.7 cm seen on CAT 01/04/09   Tardive dyskinesia    Past Surgical History:  Procedure Laterality Date   ABDOMINAL HYSTERECTOMY     BALLOON DILATION N/A 04/24/2020   Procedure: BALLOON DILATION;  Surgeon: Ronnette Juniper, MD;  Location: [facility];  Service: Gastroenterology;  Laterality: N/A;   BIOPSY  04/24/2020   Procedure: BIOPSY;  Surgeon: Ronnette Juniper, MD;  Location: [facility];  Service: Gastroenterology;;   BOTOX INJECTION N/A 04/24/2020   Procedure: POSSIBLE BOTOX INJECTION;  Surgeon: Ronnette Juniper, MD;  Location: [facility];  Service: Gastroenterology;  Laterality: N/A;   COLONOSCOPY  2009; 07/09/11   2009:normal (polyp was not adenoma); 2012:    DILATION AND CURETTAGE OF UTERUS     ESOPHAGEAL MANOMETRY N/A 05/31/2020   Procedure: ESOPHAGEAL MANOMETRY (EM);  Surgeon:  Ronnette Juniper, MD;  Location: [facility];  Service: Gastroenterology;  Laterality: N/A;   ESOPHAGOGASTRODUODENOSCOPY  07/09/11   Erosive gastritis   ESOPHAGOGASTRODUODENOSCOPY (EGD) WITH PROPOFOL N/A 04/24/2020   Procedure: ESOPHAGOGASTRODUODENOSCOPY (EGD) WITH PROPOFOL;  Surgeon: Ronnette Juniper, MD;  Location: [facility];  Service: Gastroenterology;  Laterality: N/A;   FRACTURE SURGERY     rt fx upper arm   GIVENS CAPSULE STUDY  07/25/2011   normal small bowel   REIMPLANT URETER IN BLADDER  1996   TRIGGER FINGER RELEASE Left 02/04/2013   Procedure: RELEASE TRIGGER FINGER/A-1 PULLEY LEFT RING FINGER;  Surgeon: Tennis Must, MD;  Location: [facility];  Service: Orthopedics;  Laterality: Left;   Patient Active Problem List   Diagnosis Date Noted   NSVT (nonsustained ventricular tachycardia) 02/24/2021   Bifascicular block 02/24/2021   Fall at home 10/31/2020   Tardive dyskinesia 07/18/2020   Diabetic peripheral neuropathy ([place]) 07/18/2020   Gait abnormality 08/26/2019   Unspecified gastritis and gastroduodenitis without mention of hemorrhage 07/09/2011   Personal history of failed moderate sedation 05/20/2011   ACUTE CYSTITIS 02/23/2008   HYPERLIPIDEMIA 10/30/2007   DIABETES MELLITUS, TYPE II 07/08/2007   Iron deficiency anemia, unspecified 07/08/2007  DEPRESSION 07/08/2007   HYPERTENSION 07/08/2007    PCP: [name], MD  REFERRING PROVIDER: [name], MD  REFERRING DIAG: R26.9 (ICD-10-CM) - Unspecified abnormalities of gait and mobility  THERAPY DIAG:  Other abnormalities of gait and mobility - Plan: PT plan of care cert/re-cert  Repeated falls - Plan: PT plan of care cert/re-cert  Muscle weakness (generalized) - Plan: PT plan of care cert/re-cert  ONSET DATE: declining mobility over the past 2 years   SUBJECTIVE:   SUBJECTIVE STATEMENT: Patient has a long histroy of medical complications starting about 20 years ago. She fell and broke her left arm  and developed lymphadema in her arm. At this time they checked her knees to see why she fell. It was found that she had significant arthrtis but no pain. She went 14 years before requiring knee replacements. She did well with her replacements. She was an avid water walker until Covid. Around that time she alos develped tardive dykenisia. She reports she has had a progressive loss of balance since that point. She fell a few days ago. She feels like that came just from running into something. She uses a walker most of the time.   PERTINENT HISTORY: Tardive dyskenisia, Bilateral Knee replacement ( APROX 6 YEARS AGO); lymphedema, OA (multi joint but doesn't have pain with it) lymphedema   PAIN:  Are you having pain? Yes NPRS scale: 0/10 Pain location: back  Pain orientation: Right and Left  PAIN TYPE: aching Pain description: intermittent  Aggravating factors: walking  Relieving factors: rest   Can have pain in both shoulders at times;   PRECAUTIONS: Fall; has had major syncope in the past but not in the past year. She has a loop recorder at this time   WEIGHT BEARING RESTRICTIONS No  FALLS:  Has patient fallen in last 6 months? Yes, Number of falls: 3   LIVING ENVIRONMENT: 3 steps into the house   OCCUPATION: Retired   Office manager:  Water walking; Art; reading    PLOF: Independent with household mobility with device uses Rolator   PATIENT GOALS  Improve strength, ability to walk, stamina and balance    OBJECTIVE:   DIAGNOSTIC FINDINGS:  Nothing recent   PATIENT SURVEYS:  FOTO 45% on intake 47% expected in 12 visits   COGNITION:  Overall cognitive status: Within functional limits for tasks assessed     SENSATION:  Light touch: Appears intact  Stereognosis: Appears intact  Hot/Cold: Appears intact  Proprioception: Appears intact  Significant bilateral peripheral neuropathy  POSTURE:  Forward head; mild rouneding of the shoulders. Lymphadema of the left arm     PALPATION: No significant tenderenss to palpation   LE AROM/PROM: Pain with end range shoulder flexion and er on the left     LE MMT:  MMT Right 01/19/2022 Left 01/19/2022  Hip flexion 13.6 13.9  Hip extension    Hip abduction 31.1 20.4  Hip adduction    Hip internal rotation    Hip external rotation    Knee flexion    Knee extension 21.5 17.4  Ankle dorsiflexion    Ankle plantarflexion    Ankle inversion    Ankle eversion     (Blank rows = not tested) FUNCTIONAL TESTS:  5x sit to stand test: 17 seconds without hands   GAIT: Flexed posture on the walker; decreased bilateral hip flexion;    TODAY'S TREATMENT:  Exercises Seated Knee Extension with Resistance - 1 x daily - 7 x weekly - 3 sets -  10 reps Seated Hip Abduction with Resistance - 1 x daily - 7 x weekly - 3 sets - 10 reps Seated Knee Extension with Resistance - 1 x daily - 7 x weekly - 3 sets - 10 reps    PATIENT EDUCATION:  Education details:  Benefits of water training; progression of activity; HEP; balance training  Person educated: Patient Education method: Explanation, Demonstration, Tactile cues, Verbal cues, and Handouts Education comprehension: verbalized understanding, returned demonstration, verbal cues required, tactile cues required, and needs further education   HOME EXERCISE PROGRAM: Access Code: [email or web address] URL: [email or web address] Date: 01/18/2022 Prepared by: Carolyne Littles  Exercises Seated Knee Extension with Resistance - 1 x daily - 7 x weekly - 3 sets - 10 reps Seated Hip Abduction with Resistance - 1 x daily - 7 x weekly - 3 sets - 10 reps Seated Knee Extension with Resistance - 1 x daily - 7 x weekly - 3 sets - 10 reps   ASSESSMENT:  CLINICAL IMPRESSION: Patient is a 76 y.o. female who was seen today for physical therapy evaluation and treatment for decreased balance, mobility , and frequent falls. She presents with decreased balance with narrow base of  support and tandem stance. We will perform a BERG test next visit as well as a 6 minute walk test. She hads decreased strength bilateral L> R . She had a fall a few days ago. Se bumped into something and lost her balance. Over a year ago she was having episodes of severe veritgo. She did not have vertigo. She now has a loop recorder. She would benefit from skilled therapy to help her return to water walking and exercising and to improve strength and balance.    OBJECTIVE IMPAIRMENTS Abnormal gait, decreased activity tolerance, decreased balance, decreased coordination, decreased endurance, decreased mobility, difficulty walking, decreased ROM, decreased strength, impaired UE functional use, improper body mechanics, and pain.   ACTIVITY LIMITATIONS cleaning, community activity, occupation, and yard work.   PERSONAL FACTORS Tardive dyskenisia, Bilateral Knee replacement ( APROX 6 YEARS AGO); lymphedema, OA (multi joint but doesn't have pain with it) lymphedema  are also affecting patient's functional outcome.    REHAB POTENTIAL: Good  CLINICAL DECISION MAKING: Evolving/moderate complexity  EVALUATION COMPLEXITY: Moderate   GOALS: Goals reviewed with patient? No  SHORT TERM GOALS:  Patient will increase gross bilateral LE strength by 5 lbs  Baseline:  Target date: 02/16/2022 Goal status: INITIAL  2.  Patient will improve BERG score by 5 points  Baseline: ( still needs to be tested)  Target date: 02/16/2022 Goal status: INITIAL  3.  Patient will be independent with base exercise program  Baseline:  Target date: 02/16/2022 Goal status: INITIAL  LONG TERM GOALS:  Patient will be independent with complete gym and pool program to promote balance and strength  Baseline:  Target date: 03/16/2022 Goal status: INITIAL  2.  Patient will ambulate 1 mile without improved endurance and balance in order to perform IADL's such as shopping  Baseline:  Target date: 03/16/2022 Goal status:  INITIAL  3.  Patient will stand for 30 minutes with good balance and endurance  Baseline:  Target date: 03/16/2022 Goal status: INITIAL   PLAN: PT FREQUENCY: 2x/week  PT DURATION: 8 weeks  PLANNED INTERVENTIONS: Therapeutic exercises, Therapeutic activity, Neuromuscular re-education, Balance training, Gait training, Patient/Family education, Joint mobilization, Stair training, DME instructions, Aquatic Therapy, Electrical stimulation, Cryotherapy, Moist heat, and Manual therapy  PLAN FOR NEXT SESSION: BERG and 6 min walk on land. Begin general  strength training and endurance training on land. In the water work on balance, core and LE strengthening.    Carney Living, PT 01/19/2022, 8:33 AM

## 2022-01-19 ENCOUNTER — Encounter (HOSPITAL_BASED_OUTPATIENT_CLINIC_OR_DEPARTMENT_OTHER): Payer: Self-pay | Admitting: Physical Therapy

## 2022-01-21 DIAGNOSIS — F3181 Bipolar II disorder: Secondary | ICD-10-CM | POA: Diagnosis not present

## 2022-01-24 ENCOUNTER — Encounter (HOSPITAL_BASED_OUTPATIENT_CLINIC_OR_DEPARTMENT_OTHER): Payer: Self-pay | Admitting: Physical Therapy

## 2022-01-24 ENCOUNTER — Other Ambulatory Visit: Payer: Self-pay

## 2022-01-24 ENCOUNTER — Ambulatory Visit (HOSPITAL_BASED_OUTPATIENT_CLINIC_OR_DEPARTMENT_OTHER): Payer: Medicare Other | Admitting: Physical Therapy

## 2022-01-24 DIAGNOSIS — R296 Repeated falls: Secondary | ICD-10-CM | POA: Diagnosis not present

## 2022-01-24 DIAGNOSIS — M6281 Muscle weakness (generalized): Secondary | ICD-10-CM | POA: Diagnosis not present

## 2022-01-24 DIAGNOSIS — R2689 Other abnormalities of gait and mobility: Secondary | ICD-10-CM | POA: Diagnosis not present

## 2022-01-24 NOTE — Therapy (Signed)
OUTPATIENT PHYSICAL THERAPY LOWER EXTREMITY Treatment    Patient Name: [name] [name] MRN: [id number] DOB:August 14, 1946, 76 y.o., female Today's Date: 01/25/2022   PT End of Session - 01/25/22 1533     Visit Number 2    Number of Visits 16    Date for PT Re-Evaluation 03/01/22    Authorization Type MCR a and B progress note at 10 Kx at 15    PT Start Time 1400    PT Stop Time 1443    PT Time Calculation (min) 43 min    Activity Tolerance Patient tolerated treatment well    Behavior During Therapy [facility] for tasks assessed/performed              Past Medical History:  Diagnosis Date   Allergic rhinitis    Bipolar disorder ([place])    Cholelithiasis    seen on CAT 01/04/09   Chronic renal disease, stage III ([place])    5/68   Complication of anesthesia    versed and fentanyl did not work for colonoscopy   Diabetic peripheral neuropathy ([place]) 07/18/2020   DM (diabetes mellitus) ([place])    Edema    Gait abnormality 08/26/2019   HLD (hyperlipidemia)    HTN (hypertension)    Iron deficiency anemia    3/12    Lymphedema    tarda   Neuropathy    Obstructive sleep apnea on CPAP    Osteoarthritis    Renal cyst    2.7 cm seen on CAT 01/04/09   Tardive dyskinesia    Past Surgical History:  Procedure Laterality Date   ABDOMINAL HYSTERECTOMY     BALLOON DILATION N/A 04/24/2020   Procedure: BALLOON DILATION;  Surgeon: Ronnette Juniper, MD;  Location: [facility];  Service: Gastroenterology;  Laterality: N/A;   BIOPSY  04/24/2020   Procedure: BIOPSY;  Surgeon: Ronnette Juniper, MD;  Location: [facility];  Service: Gastroenterology;;   BOTOX INJECTION N/A 04/24/2020   Procedure: POSSIBLE BOTOX INJECTION;  Surgeon: Ronnette Juniper, MD;  [facility]: [facility];  Service: Gastroenterology;  Laterality: N/A;   COLONOSCOPY  2009; 07/09/11   2009:normal (polyp was not adenoma); 2012:    DILATION AND CURETTAGE OF UTERUS     ESOPHAGEAL MANOMETRY N/A 05/31/2020   Procedure: ESOPHAGEAL MANOMETRY (EM);  Surgeon:  Ronnette Juniper, MD;  [facility]: [facility];  Service: Gastroenterology;  Laterality: N/A;   ESOPHAGOGASTRODUODENOSCOPY  07/09/11   Erosive gastritis   ESOPHAGOGASTRODUODENOSCOPY (EGD) WITH PROPOFOL N/A 04/24/2020   Procedure: ESOPHAGOGASTRODUODENOSCOPY (EGD) WITH PROPOFOL;  Surgeon: Ronnette Juniper, MD;  [facility];  Service: Gastroenterology;  Laterality: N/A;   FRACTURE SURGERY     rt fx upper arm   GIVENS CAPSULE STUDY  07/25/2011   normal small bowel   REIMPLANT URETER IN BLADDER  1996   TRIGGER FINGER RELEASE Left 02/04/2013   Procedure: RELEASE TRIGGER FINGER/A-1 PULLEY LEFT RING FINGER;  Surgeon: Tennis Must, MD;  Location: [facility];  Service: Orthopedics;  Laterality: Left;   Patient Active Problem List   Diagnosis Date Noted   NSVT (nonsustained ventricular tachycardia) 02/24/2021   Bifascicular block 02/24/2021   Fall at home 10/31/2020   Tardive dyskinesia 07/18/2020   Diabetic peripheral neuropathy ([place]) 07/18/2020   Gait abnormality 08/26/2019   Unspecified gastritis and gastroduodenitis without mention of hemorrhage 07/09/2011   Personal history of failed moderate sedation 05/20/2011   ACUTE CYSTITIS 02/23/2008   HYPERLIPIDEMIA 10/30/2007   DIABETES MELLITUS, TYPE II 07/08/2007   Iron deficiency anemia, unspecified 07/08/2007  DEPRESSION 07/08/2007   HYPERTENSION 07/08/2007    PCP: [name], MD  REFERRING PROVIDER: [name], MD  REFERRING DIAG: R26.9 (ICD-10-CM) - Unspecified abnormalities of gait and mobility  THERAPY DIAG:  Other abnormalities of gait and mobility  Repeated falls  Muscle weakness (generalized)  ONSET DATE: declining mobility over the past 2 years   SUBJECTIVE:   SUBJECTIVE STATEMENT: The patient reports she is having some pain today in different joints. Overall she reports she just isn't moving that well.       Patient has a long histroy of medical complications starting about 20 years ago. She  fell and broke her left arm and developed lymphadema in her arm. At this time they checked her knees to see why she fell. It was found that she had significant arthrtis but no pain. She went 14 years before requiring knee replacements. She did well with her replacements. She was an avid water walker until Covid. Around that time she alos develped tardive dykenisia. She reports she has had a progressive loss of balance since that point. She fell a few days ago. She feels like that came just from running into something. She uses a walker most of the time.   PERTINENT HISTORY: Tardive dyskenisia, Bilateral Knee replacement ( APROX 6 YEARS AGO); lymphedema, OA (multi joint but doesn't have pain with it) lymphedema   PAIN:  Are you having pain? Yes NPRS scale: 0/10 Pain location: back  Pain orientation: Right and Left  PAIN TYPE: aching Pain description: intermittent  Aggravating factors: walking  Relieving factors: rest   Can have pain in both shoulders at times;   PRECAUTIONS: Fall; has had major syncope in the past but not in the past year. She has a loop recorder at this time   WEIGHT BEARING RESTRICTIONS No  FALLS:  Has patient fallen in last 6 months? Yes, Number of falls: 3   LIVING ENVIRONMENT: 3 steps into the house   OCCUPATION: Retired   Office manager:  Water walking; Art; reading    PLOF: Independent with household mobility with device uses Rolator   PATIENT GOALS  Improve strength, ability to walk, stamina and balance    OBJECTIVE:     TODAY'S TREATMENT:   3/9  Nu-step: L2 5 min UE /LE  Supine:  Ball squeeze 3x10  Hip abduction band red 3x10  Supine march 3x10  SLR 2x5 on the riht 2x10 on the left   Standing:  Narrow base of support 2x30 sec hold  Narrow base of support eyes closed 2x30 sec hold           Eval:   Exercises Seated Knee Extension with Resistance - 1 x daily - 7 x weekly - 3 sets - 10 reps Seated Hip Abduction with Resistance - 1  x daily - 7 x weekly - 3 sets - 10 reps Seated Knee Extension with Resistance - 1 x daily - 7 x weekly - 3 sets - 10 reps    PATIENT EDUCATION:  Education details:  Benefits of water training; progression of activity; HEP; balance training  Person educated: Patient Education method: Explanation, Demonstration, Tactile cues, Verbal cues, and Handouts Education comprehension: verbalized understanding, returned demonstration, verbal cues required, tactile cues required, and needs further education   HOME EXERCISE PROGRAM: Access Code: [email or web address] URL: [email or web address] Date: 01/18/2022 Prepared by: Carolyne Littles  Exercises Seated Knee Extension with Resistance - 1 x daily - 7 x weekly - 3 sets - 10 reps Seated Hip Abduction  with Resistance - 1 x daily - 7 x weekly - 3 sets - 10 reps Seated Knee Extension with Resistance - 1 x daily - 7 x weekly - 3 sets - 10 reps   ASSESSMENT:  CLINICAL IMPRESSION: Therapy advanced HEP today. We added in supine exercises. She also performed standing balance exercises. She required more assist with balance exercises with her eyes closed. We will continue to progress as tolerated. Next visit we will perform a 6 min walk test and a BERG test. She had more difficulty with SLR on the right   OBJECTIVE IMPAIRMENTS Abnormal gait, decreased activity tolerance, decreased balance, decreased coordination, decreased endurance, decreased mobility, difficulty walking, decreased ROM, decreased strength, impaired UE functional use, improper body mechanics, and pain.   ACTIVITY LIMITATIONS cleaning, community activity, occupation, and yard work.   PERSONAL FACTORS Tardive dyskenisia, Bilateral Knee replacement ( APROX 6 YEARS AGO); lymphedema, OA (multi joint but doesn't have pain with it) lymphedema  are also affecting patient's functional outcome.    REHAB POTENTIAL: Good  CLINICAL DECISION MAKING: Evolving/moderate complexity  EVALUATION  COMPLEXITY: Moderate   GOALS: Goals reviewed with patient? No  SHORT TERM GOALS:  Patient will increase gross bilateral LE strength by 5 lbs  Baseline:  Target date: 02/22/2022 Goal status: INITIAL  2.  Patient will improve BERG score by 5 points  Baseline: ( still needs to be tested)  Target date: 02/22/2022 Goal status: INITIAL  3.  Patient will be independent with base exercise program  Baseline:  Target date: 02/22/2022 Goal status: INITIAL  LONG TERM GOALS:  Patient will be independent with complete gym and pool program to promote balance and strength  Baseline:  Target date: 03/22/2022 Goal status: INITIAL  2.  Patient will ambulate 1 mile without improved endurance and balance in order to perform IADL's such as shopping  Baseline:  Target date: 03/22/2022 Goal status: INITIAL  3.  Patient will stand for 30 minutes with good balance and endurance  Baseline:  Target date: 03/22/2022 Goal status: INITIAL   PLAN: PT FREQUENCY: 2x/week  PT DURATION: 8 weeks  PLANNED INTERVENTIONS: Therapeutic exercises, Therapeutic activity, Neuromuscular re-education, Balance training, Gait training, Patient/Family education, Joint mobilization, Stair training, DME instructions, Aquatic Therapy, Electrical stimulation, Cryotherapy, Moist heat, and Manual therapy  PLAN FOR NEXT SESSION: BERG and 6 min walk on land. Begin general strength training and endurance training on land. In the water work on balance, core and LE strengthening.    Carney Living, PT 01/25/2022, 3:35 PM

## 2022-01-25 ENCOUNTER — Encounter (HOSPITAL_BASED_OUTPATIENT_CLINIC_OR_DEPARTMENT_OTHER): Payer: Self-pay | Admitting: Physical Therapy

## 2022-01-28 ENCOUNTER — Other Ambulatory Visit: Payer: Self-pay

## 2022-01-28 ENCOUNTER — Encounter (HOSPITAL_BASED_OUTPATIENT_CLINIC_OR_DEPARTMENT_OTHER): Payer: Self-pay | Admitting: Physical Therapy

## 2022-01-28 ENCOUNTER — Ambulatory Visit (INDEPENDENT_AMBULATORY_CARE_PROVIDER_SITE_OTHER): Payer: Medicare Other

## 2022-01-28 ENCOUNTER — Ambulatory Visit (HOSPITAL_BASED_OUTPATIENT_CLINIC_OR_DEPARTMENT_OTHER): Payer: Medicare Other | Admitting: Physical Therapy

## 2022-01-28 DIAGNOSIS — R55 Syncope and collapse: Secondary | ICD-10-CM | POA: Diagnosis not present

## 2022-01-28 DIAGNOSIS — M6281 Muscle weakness (generalized): Secondary | ICD-10-CM | POA: Diagnosis not present

## 2022-01-28 DIAGNOSIS — R296 Repeated falls: Secondary | ICD-10-CM | POA: Diagnosis not present

## 2022-01-28 DIAGNOSIS — R2689 Other abnormalities of gait and mobility: Secondary | ICD-10-CM | POA: Diagnosis not present

## 2022-01-28 NOTE — Therapy (Signed)
OUTPATIENT PHYSICAL THERAPY LOWER EXTREMITY Treatment    Patient Name: [name] [name] MRN: [id number] DOB:04/03/1946, 76 y.o., female Today's Date: 01/28/2022   PT End of Session - 01/28/22 1105     Visit Number 3    Number of Visits 16    Date for PT Re-Evaluation 03/01/22    Authorization Type MCR a and B progress note at 10 Kx at 15    PT Start Time 1100    PT Stop Time 1142    PT Time Calculation (min) 42 min    Activity Tolerance Patient tolerated treatment well    Behavior During Therapy [facility] for tasks assessed/performed              Past Medical History:  Diagnosis Date   Allergic rhinitis    Bipolar disorder ([place])    Cholelithiasis    seen on CAT 01/04/09   Chronic renal disease, stage III ([place])    4/16   Complication of anesthesia    versed and fentanyl did not work for colonoscopy   Diabetic peripheral neuropathy ([place]) 07/18/2020   DM (diabetes mellitus) ([place])    Edema    Gait abnormality 08/26/2019   HLD (hyperlipidemia)    HTN (hypertension)    Iron deficiency anemia    3/12    Lymphedema    tarda   Neuropathy    Obstructive sleep apnea on CPAP    Osteoarthritis    Renal cyst    2.7 cm seen on CAT 01/04/09   Tardive dyskinesia    Past Surgical History:  Procedure Laterality Date   ABDOMINAL HYSTERECTOMY     BALLOON DILATION N/A 04/24/2020   Procedure: BALLOON DILATION;  Surgeon: Ronnette Juniper, MD;  [facility];  Service: Gastroenterology;  Laterality: N/A;   BIOPSY  04/24/2020   Procedure: BIOPSY;  Surgeon: Ronnette Juniper, MD;  Location: [facility];  Service: Gastroenterology;;   BOTOX INJECTION N/A 04/24/2020   Procedure: POSSIBLE BOTOX INJECTION;  Surgeon: Ronnette Juniper, MD;  [facility];  Service: Gastroenterology;  Laterality: N/A;   COLONOSCOPY  2009; 07/09/11   2009:normal (polyp was not adenoma); 2012:    DILATION AND CURETTAGE OF UTERUS     ESOPHAGEAL MANOMETRY N/A 05/31/2020   Procedure: ESOPHAGEAL MANOMETRY (EM);  Surgeon:  Ronnette Juniper, MD;  [facility];  Service: Gastroenterology;  Laterality: N/A;   ESOPHAGOGASTRODUODENOSCOPY  07/09/11   Erosive gastritis   ESOPHAGOGASTRODUODENOSCOPY (EGD) WITH PROPOFOL N/A 04/24/2020   Procedure: ESOPHAGOGASTRODUODENOSCOPY (EGD) WITH PROPOFOL;  Surgeon: Ronnette Juniper, MD;  [facility];  Service: Gastroenterology;  Laterality: N/A;   FRACTURE SURGERY     rt fx upper arm   GIVENS CAPSULE STUDY  07/25/2011   normal small bowel   REIMPLANT URETER IN BLADDER  1996   TRIGGER FINGER RELEASE Left 02/04/2013   Procedure: RELEASE TRIGGER FINGER/A-1 PULLEY LEFT RING FINGER;  Surgeon: Tennis Must, MD;  Location: [facility];  Service: Orthopedics;  Laterality: Left;   Patient Active Problem List   Diagnosis Date Noted   NSVT (nonsustained ventricular tachycardia) 02/24/2021   Bifascicular block 02/24/2021   Fall at home 10/31/2020   Tardive dyskinesia 07/18/2020   Diabetic peripheral neuropathy ([place]) 07/18/2020   Gait abnormality 08/26/2019   Unspecified gastritis and gastroduodenitis without mention of hemorrhage 07/09/2011   Personal history of failed moderate sedation 05/20/2011   ACUTE CYSTITIS 02/23/2008   HYPERLIPIDEMIA 10/30/2007   DIABETES MELLITUS, TYPE II 07/08/2007   Iron deficiency anemia, unspecified 07/08/2007  DEPRESSION 07/08/2007   HYPERTENSION 07/08/2007    PCP: [name], MD  REFERRING PROVIDER: [name], MD  REFERRING DIAG: R26.9 (ICD-10-CM) - Unspecified abnormalities of gait and mobility  THERAPY DIAG:  No diagnosis found.  ONSET DATE: declining mobility over the past 2 years   SUBJECTIVE:   SUBJECTIVE STATEMENT: The patient reports she just had some general problems with mobility over the weekend.     Patient has a long histroy of medical complications starting about 20 years ago. She fell and broke her left arm and developed lymphadema in her arm. At this time they checked her knees to see why she  fell. It was found that she had significant arthrtis but no pain. She went 14 years before requiring knee replacements. She did well with her replacements. She was an avid water walker until Covid. Around that time she alos develped tardive dykenisia. She reports she has had a progressive loss of balance since that point. She fell a few days ago. She feels like that came just from running into something. She uses a walker most of the time.   PERTINENT HISTORY: Tardive dyskenisia, Bilateral Knee replacement ( APROX 6 YEARS AGO); lymphedema, OA (multi joint but doesn't have pain with it) lymphedema   PAIN:  Are you having pain? Yes 3/13 NPRS scale: 0/10 Pain location: back  Pain orientation: Right and Left  PAIN TYPE: aching Pain description: intermittent  Aggravating factors: walking  Relieving factors: rest   Can have pain in both shoulders at times;   PRECAUTIONS: Fall; has had major syncope in the past but not in the past year. She has a loop recorder at this time   WEIGHT BEARING RESTRICTIONS No  FALLS:  Has patient fallen in last 6 months? Yes, Number of falls: 3   LIVING ENVIRONMENT: 3 steps into the house   OCCUPATION: Retired   Office manager:  Water walking; Art; reading    PLOF: Independent with household mobility with device uses Rolator   PATIENT GOALS  Improve strength, ability to walk, stamina and balance    OBJECTIVE:     TODAY'S TREATMENT: 3/15 Ex-bike 3:30: had some difficulty keeping her feet in the bike   Bilateral ER yellow 2x10  Horizontal abduction 2x10  Shoulder flexion 2x10 wand  Biceps curl 2x10   Narrow base of support 2x30 sec hold  Narrow base of support eyes closed 2x30 sec hold   Box steps 4 way x10  Forward and back rythmic step x10 each way    3/9  Nu-step: L2 5 min UE /LE  Supine:  Ball squeeze 3x10  Hip abduction band red 3x10  Supine march 3x10  SLR 2x5 on the riht 2x10 on the left   Standing:  Narrow base of support 2x30  sec hold  Narrow base of support eyes closed 2x30 sec hold   Forward and back step with rhythm x20  Box step with rhythm x10 each way            Eval:   Exercises Seated Knee Extension with Resistance - 1 x daily - 7 x weekly - 3 sets - 10 reps Seated Hip Abduction with Resistance - 1 x daily - 7 x weekly - 3 sets - 10 reps Seated Knee Extension with Resistance - 1 x daily - 7 x weekly - 3 sets - 10 reps    PATIENT EDUCATION:  Education details:  Benefits of water training; progression of activity; HEP; balance training  Person educated:  Patient Education method: Explanation, Demonstration, Tactile cues, Verbal cues, and Handouts Education comprehension: verbalized understanding, returned demonstration, verbal cues required, tactile cues required, and needs further education   HOME EXERCISE PROGRAM: Access Code: [email or web address] URL: [email or web address] Date: 01/18/2022 Prepared by: Carolyne Littles  Exercises Seated Knee Extension with Resistance - 1 x daily - 7 x weekly - 3 sets - 10 reps Seated Hip Abduction with Resistance - 1 x daily - 7 x weekly - 3 sets - 10 reps Seated Knee Extension with Resistance - 1 x daily - 7 x weekly - 3 sets - 10 reps   ASSESSMENT:  CLINICAL IMPRESSION:  The patient tolerated treatment well. Therapy added a seated series of exercises. She has some issue with her left arm but she had no increase in pain with her UE series. She was mildly SOB. We trailed the exercise bike, but she had difficulty keeping her feet in the pedal of the bike. We also added coordinated stepping today.  She required min a for balance with his coordinated stepping, She found it challenging.  OBJECTIVE IMPAIRMENTS Abnormal gait, decreased activity tolerance, decreased balance, decreased coordination, decreased endurance, decreased mobility, difficulty walking, decreased ROM, decreased strength, impaired UE functional use, improper body mechanics, and pain.    ACTIVITY LIMITATIONS cleaning, community activity, occupation, and yard work.   PERSONAL FACTORS Tardive dyskenisia, Bilateral Knee replacement ( APROX 6 YEARS AGO); lymphedema, OA (multi joint but doesn't have pain with it) lymphedema  are also affecting patient's functional outcome.    REHAB POTENTIAL: Good  CLINICAL DECISION MAKING: Evolving/moderate complexity  EVALUATION COMPLEXITY: Moderate   GOALS: Goals reviewed with patient? No  SHORT TERM GOALS:  Patient will increase gross bilateral LE strength by 5 lbs  Baseline:  Target date: 02/25/2022 Goal status: INITIAL  2.  Patient will improve BERG score by 5 points  Baseline: ( still needs to be tested)  Target date: 02/25/2022 Goal status: INITIAL  3.  Patient will be independent with base exercise program  Baseline:  Target date: 02/25/2022 Goal status: INITIAL  LONG TERM GOALS:  Patient will be independent with complete gym and pool program to promote balance and strength  Baseline:  Target date: 03/25/2022 Goal status: INITIAL  2.  Patient will ambulate 1 mile without improved endurance and balance in order to perform IADL's such as shopping  Baseline:  Target date: 03/25/2022 Goal status: INITIAL  3.  Patient will stand for 30 minutes with good balance and endurance  Baseline:  Target date: 03/25/2022 Goal status: INITIAL   PLAN: PT FREQUENCY: 2x/week  PT DURATION: 8 weeks  PLANNED INTERVENTIONS: Therapeutic exercises, Therapeutic activity, Neuromuscular re-education, Balance training, Gait training, Patient/Family education, Joint mobilization, Stair training, DME instructions, Aquatic Therapy, Electrical stimulation, Cryotherapy, Moist heat, and Manual therapy  PLAN FOR NEXT SESSION: BERG and 6 min walk on land. Begin general strength training and endurance training on land. In the water work on balance, core and LE strengthening.    Carney Living, PT 01/28/2022, 11:08 AM

## 2022-01-29 LAB — CUP PACEART REMOTE DEVICE CHECK
Date Time Interrogation Session: 20230312230246
Implantable Pulse Generator Implant Date: 20220411

## 2022-02-05 ENCOUNTER — Other Ambulatory Visit: Payer: Self-pay

## 2022-02-05 ENCOUNTER — Ambulatory Visit (HOSPITAL_BASED_OUTPATIENT_CLINIC_OR_DEPARTMENT_OTHER): Payer: Medicare Other | Admitting: Physical Therapy

## 2022-02-05 DIAGNOSIS — R296 Repeated falls: Secondary | ICD-10-CM | POA: Diagnosis not present

## 2022-02-05 DIAGNOSIS — R2689 Other abnormalities of gait and mobility: Secondary | ICD-10-CM | POA: Diagnosis not present

## 2022-02-05 DIAGNOSIS — M6281 Muscle weakness (generalized): Secondary | ICD-10-CM

## 2022-02-05 NOTE — Therapy (Signed)
?OUTPATIENT PHYSICAL THERAPY LOWER EXTREMITY Treatment  ? ? ?Patient Name: [name] [name] ?MRN: [id number] ?DOB:June 20, 1946, 76 y.o., female ?Today's Date: 02/06/2022 ? ? PT End of Session - 02/06/22 1510   ? ? Visit Number 4   ? Number of Visits 16   ? Date for PT Re-Evaluation 03/01/22   ? Authorization Type MCR a and B progress note at 10 Kx at 15   ? PT Start Time 1515   ? PT Stop Time 1558   ? PT Time Calculation (min) 43 min   ? Activity Tolerance Patient tolerated treatment well   ? Behavior During Therapy [facility] for tasks assessed/performed   ? ?  ?  ? ?  ? ? ? ? ?Past Medical History:  ?Diagnosis Date  ? Allergic rhinitis   ? Bipolar disorder ([place])   ? Cholelithiasis   ? seen on CAT 01/04/09  ? Chronic renal disease, stage III ([place])   ? 3/12  ? Complication of anesthesia   ? versed and fentanyl did not work for colonoscopy  ? Diabetic peripheral neuropathy ([place]) 07/18/2020  ? DM (diabetes mellitus) ([place])   ? Edema   ? Gait abnormality 08/26/2019  ? HLD (hyperlipidemia)   ? HTN (hypertension)   ? Iron deficiency anemia   ? 3/12   ? Lymphedema   ? tarda  ? Neuropathy   ? Obstructive sleep apnea on CPAP   ? Osteoarthritis   ? Renal cyst   ? 2.7 cm seen on CAT 01/04/09  ? Tardive dyskinesia   ? ?Past Surgical History:  ?Procedure Laterality Date  ? ABDOMINAL HYSTERECTOMY    ? BALLOON DILATION N/A 04/24/2020  ? Procedure: BALLOON DILATION;  Surgeon: Ronnette Juniper, MD;  Location: [facility];  Service: Gastroenterology;  Laterality: N/A;  ? BIOPSY  04/24/2020  ? Procedure: BIOPSY;  Surgeon: Ronnette Juniper, MD;  Location: [facility];  Service: Gastroenterology;;  ? BOTOX INJECTION N/A 04/24/2020  ? Procedure: POSSIBLE BOTOX INJECTION;  Surgeon: Ronnette Juniper, MD;  Location: [facility];  Service: Gastroenterology;  Laterality: N/A;  ? COLONOSCOPY  2009; 07/09/11  ? 2009:normal (polyp was not adenoma); 2012:   ? [place]    ? ESOPHAGEAL MANOMETRY N/A 05/31/2020  ? Procedure: ESOPHAGEAL MANOMETRY (EM);  Surgeon:  Ronnette Juniper, MD;  Location: [facility];  Service: Gastroenterology;  Laterality: N/A;  ? ESOPHAGOGASTRODUODENOSCOPY  07/09/11  ? Erosive gastritis  ? ESOPHAGOGASTRODUODENOSCOPY (EGD) WITH PROPOFOL N/A 04/24/2020  ? Procedure: ESOPHAGOGASTRODUODENOSCOPY (EGD) WITH PROPOFOL;  Surgeon: Ronnette Juniper, MD;  Location: [facility];  Service: Gastroenterology;  Laterality: N/A;  ? FRACTURE SURGERY    ? rt fx upper arm  ? GIVENS CAPSULE STUDY  07/25/2011  ? normal small bowel  ? REIMPLANT URETER IN BLADDER  1996  ? TRIGGER FINGER RELEASE Left 02/04/2013  ? Procedure: RELEASE TRIGGER FINGER/A-1 PULLEY LEFT RING FINGER;  Surgeon: Tennis Must, MD;  Location: [facility];  Service: Orthopedics;  Laterality: Left;  ? ?Patient Active Problem List  ? Diagnosis Date Noted  ? NSVT (nonsustained ventricular tachycardia) 02/24/2021  ? Bifascicular block 02/24/2021  ? Fall at home 10/31/2020  ? Tardive dyskinesia 07/18/2020  ? Diabetic peripheral neuropathy ([place]) 07/18/2020  ? Gait abnormality 08/26/2019  ? Unspecified gastritis and gastroduodenitis without mention of hemorrhage 07/09/2011  ? Personal history of failed moderate sedation 05/20/2011  ? ACUTE CYSTITIS 02/23/2008  ? HYPERLIPIDEMIA 10/30/2007  ? DIABETES MELLITUS, TYPE II 07/08/2007  ? Iron deficiency anemia, unspecified  07/08/2007  ? DEPRESSION 07/08/2007  ? HYPERTENSION 07/08/2007  ? ? ?PCP: [name], MD ? ?REFERRING PROVIDER: [name], MD ? ?REFERRING DIAG: R26.9 (ICD-10-CM) - Unspecified abnormalities of gait and mobility ? ?THERAPY DIAG:  ?Other abnormalities of gait and mobility ? ?Repeated falls ? ?Muscle weakness (generalized) ? ?ONSET DATE: declining mobility over the past 2 years  ? ?SUBJECTIVE:  ? ?SUBJECTIVE STATEMENT: ?The patient reports mild soreness in her LE. She did a lot of walking over the weekend.  ? ? ? ?Patient has a long histroy of medical complications starting about 20 years ago. She fell and broke her left arm and developed  lymphadema in her arm. At this time they checked her knees to see why she fell. It was found that she had significant arthrtis but no pain. She went 14 years before requiring knee replacements. She did well with her replacements. She was an avid water walker until Covid. Around that time she alos develped tardive dykenisia. She reports she has had a progressive loss of balance since that point. She fell a few days ago. She feels like that came just from running into something. She uses a walker most of the time.  ? ?PERTINENT HISTORY: ?Tardive dyskenisia, Bilateral Knee replacement ( APROX 6 YEARS AGO); lymphedema, OA (multi joint but doesn't have pain with it) lymphedema  ? ?PAIN:  ?Are you having pain? Yes 3/13 ?NPRS scale: 0/10 ?Pain location: back  ?Pain orientation: Right and Left  ?PAIN TYPE: aching ?Pain description: intermittent  ?Aggravating factors: walking  ?Relieving factors: rest  ? ?Can have pain in both shoulders at times;  ? ?PRECAUTIONS: Fall; has had major syncope in the past but not in the past year. She has a loop recorder at this time  ? ?WEIGHT BEARING RESTRICTIONS No ? ?FALLS:  ?Has patient fallen in last 6 months? Yes, Number of falls: 3  ? ?LIVING ENVIRONMENT: ?3 steps into the house  ? ?OCCUPATION: Retired  ? ?Hobbies:  ?Water walking; Art; reading  ? ? ?PLOF: Independent with household mobility with device uses Rolator  ? ?PATIENT GOALS  ?Improve strength, ability to walk, stamina and balance  ? ? ?OBJECTIVE:  ? ? ? ?TODAY'S TREATMENT: ?3/22 ?Nu-step  ?Supine march 2x10 ?Supine hip abdcution 2x10  ?LTR x20  ? ?Bilateral ER red 2x10  ?Horizontal abduction red 2x10  ?Shoulder flexion 2x10 red wand  ? ?Balance: ? ?Narrow base of support 2x30 sec hold  ?Narrow base of support eyes closed 2x30 sec hold  ? ?Narrow base on air-ex eyes open 3x20 sec hold  ? ?Heel toe air-ex x20;  ? ?Tandem left and right on air-ex 3x20 sec hold  ? ? ? ?3/15 ?Ex-bike 3:30: had some difficulty keeping her feet in  the bike  ? ?Bilateral ER yellow 2x10  ?Horizontal abduction 2x10  ?Shoulder flexion 2x10 wand  ?Biceps curl 2x10  ? ?Narrow base of support 2x30 sec hold  ?Narrow base of support eyes closed 2x30 sec hold  ? ?Box steps 4 way x10  ?Forward and back rythmic step x10 each way  ? ? ?3/9 ? ?Nu-step: L2 5 min UE /LE  ?Supine:  ?Ball squeeze 3x10  ?Hip abduction band red 3x10  ?Supine march 3x10  ?SLR 2x5 on the riht 2x10 on the left  ? ?Standing:  ?Narrow base of support 2x30 sec hold  ?Narrow base of support eyes closed 2x30 sec hold  ? ?Forward and back step with rhythm x20  ?Box step  with rhythm x10 each way ? ? ? ? ? ? ? ? ? ? ? ?Eval:  ? ?Exercises ?Seated Knee Extension with Resistance - 1 x daily - 7 x weekly - 3 sets - 10 reps ?Seated Hip Abduction with Resistance - 1 x daily - 7 x weekly - 3 sets - 10 reps ?Seated Knee Extension with Resistance - 1 x daily - 7 x weekly - 3 sets - 10 reps ? ? ? ?PATIENT EDUCATION:  ?Education details:  Benefits of water training; progression of activity; HEP; balance training  ?Person educated: Patient ?Education method: Explanation, Demonstration, Tactile cues, Verbal cues, and Handouts ?Education comprehension: verbalized understanding, returned demonstration, verbal cues required, tactile cues required, and needs further education ? ? ?HOME EXERCISE PROGRAM: ?Access Code: [email or web address] ?URL: [email or web address] ?Date: 01/18/2022 ?Prepared by: Carolyne Littles ? ?Exercises ?Seated Knee Extension with Resistance - 1 x daily - 7 x weekly - 3 sets - 10 reps ?Seated Hip Abduction with Resistance - 1 x daily - 7 x weekly - 3 sets - 10 reps ?Seated Knee Extension with Resistance - 1 x daily - 7 x weekly - 3 sets - 10 reps ? ? ?ASSESSMENT: ? ?CLINICAL IMPRESSION: ?The patient tolerated treatment well. Therapy advanced her seated exercises to a red band. She had no increase in pain in her shoulders. We also reviewed supine exercises. She did better with her static ground  balance today. Therapy advanced her to an air-ex in standing. Overall she is making good progress.  ? ?OBJECTIVE IMPAIRMENTS Abnormal gait, decreased activity tolerance, decreased balance, decreased coordinati

## 2022-02-06 ENCOUNTER — Encounter (HOSPITAL_BASED_OUTPATIENT_CLINIC_OR_DEPARTMENT_OTHER): Payer: Self-pay | Admitting: Physical Therapy

## 2022-02-11 ENCOUNTER — Other Ambulatory Visit: Payer: Self-pay

## 2022-02-11 ENCOUNTER — Encounter (HOSPITAL_BASED_OUTPATIENT_CLINIC_OR_DEPARTMENT_OTHER): Payer: Self-pay | Admitting: Physical Therapy

## 2022-02-11 ENCOUNTER — Ambulatory Visit (HOSPITAL_BASED_OUTPATIENT_CLINIC_OR_DEPARTMENT_OTHER): Payer: Medicare Other | Admitting: Physical Therapy

## 2022-02-11 DIAGNOSIS — M6281 Muscle weakness (generalized): Secondary | ICD-10-CM

## 2022-02-11 DIAGNOSIS — R2689 Other abnormalities of gait and mobility: Secondary | ICD-10-CM | POA: Diagnosis not present

## 2022-02-11 DIAGNOSIS — R296 Repeated falls: Secondary | ICD-10-CM

## 2022-02-11 NOTE — Therapy (Signed)
?OUTPATIENT PHYSICAL THERAPY LOWER EXTREMITY Treatment  ? ? ?Patient Name: [name] [name] ?MRN: [id number] ?DOB:1946-05-15, 76 y.o., female ?Today's Date: 02/11/2022 ? ? PT End of Session - 02/11/22 1055   ? ? Visit Number 5   ? Number of Visits 16   ? Date for PT Re-Evaluation 03/01/22   ? Authorization Type MCR a and B progress note at 10 Kx at 15   ? PT Start Time 0930   ? PT Stop Time 1012   ? PT Time Calculation (min) 42 min   ? Activity Tolerance Patient tolerated treatment well   ? Behavior During Therapy [facility] for tasks assessed/performed   ? ?  ?  ? ?  ? ? ? ? ? ?Past Medical History:  ?Diagnosis Date  ? Allergic rhinitis   ? Bipolar disorder ([place])   ? Cholelithiasis   ? seen on CAT 01/04/09  ? Chronic renal disease, stage III ([place])   ? 3/12  ? Complication of anesthesia   ? versed and fentanyl did not work for colonoscopy  ? Diabetic peripheral neuropathy ([place]) 07/18/2020  ? DM (diabetes mellitus) ([place])   ? Edema   ? Gait abnormality 08/26/2019  ? HLD (hyperlipidemia)   ? HTN (hypertension)   ? Iron deficiency anemia   ? 3/12   ? Lymphedema   ? tarda  ? Neuropathy   ? Obstructive sleep apnea on CPAP   ? Osteoarthritis   ? Renal cyst   ? 2.7 cm seen on CAT 01/04/09  ? Tardive dyskinesia   ? ?Past Surgical History:  ?Procedure Laterality Date  ? ABDOMINAL HYSTERECTOMY    ? BALLOON DILATION N/A 04/24/2020  ? Procedure: BALLOON DILATION;  Surgeon: Ronnette Juniper, MD;  Location: [facility];  Service: Gastroenterology;  Laterality: N/A;  ? BIOPSY  04/24/2020  ? Procedure: BIOPSY;  Surgeon: Ronnette Juniper, MD;  Location: [facility];  Service: Gastroenterology;;  ? BOTOX INJECTION N/A 04/24/2020  ? Procedure: POSSIBLE BOTOX INJECTION;  Surgeon: Ronnette Juniper, MD;  [facility]: [facility];  Service: Gastroenterology;  Laterality: N/A;  ? COLONOSCOPY  2009; 07/09/11  ? 2009:normal (polyp was not adenoma); 2012:   ? [place]    ? ESOPHAGEAL MANOMETRY N/A 05/31/2020  ? Procedure: ESOPHAGEAL MANOMETRY (EM);   Surgeon: Ronnette Juniper, MD;  Location: [facility];  Service: Gastroenterology;  Laterality: N/A;  ? ESOPHAGOGASTRODUODENOSCOPY  07/09/11  ? Erosive gastritis  ? ESOPHAGOGASTRODUODENOSCOPY (EGD) WITH PROPOFOL N/A 04/24/2020  ? Procedure: ESOPHAGOGASTRODUODENOSCOPY (EGD) WITH PROPOFOL;  Surgeon: Ronnette Juniper, MD;  Location: [facility];  Service: Gastroenterology;  Laterality: N/A;  ? FRACTURE SURGERY    ? rt fx upper arm  ? GIVENS CAPSULE STUDY  07/25/2011  ? normal small bowel  ? REIMPLANT URETER IN BLADDER  1996  ? TRIGGER FINGER RELEASE Left 02/04/2013  ? Procedure: RELEASE TRIGGER FINGER/A-1 PULLEY LEFT RING FINGER;  Surgeon: Tennis Must, MD;  Location: [facility];  Service: Orthopedics;  Laterality: Left;  ? ?Patient Active Problem List  ? Diagnosis Date Noted  ? NSVT (nonsustained ventricular tachycardia) 02/24/2021  ? Bifascicular block 02/24/2021  ? Fall at home 10/31/2020  ? Tardive dyskinesia 07/18/2020  ? Diabetic peripheral neuropathy ([place]) 07/18/2020  ? Gait abnormality 08/26/2019  ? Unspecified gastritis and gastroduodenitis without mention of hemorrhage 07/09/2011  ? Personal history of failed moderate sedation 05/20/2011  ? ACUTE CYSTITIS 02/23/2008  ? HYPERLIPIDEMIA 10/30/2007  ? DIABETES MELLITUS, TYPE II 07/08/2007  ? Iron deficiency anemia,  unspecified 07/08/2007  ? DEPRESSION 07/08/2007  ? HYPERTENSION 07/08/2007  ? ? ?PCP: [name], MD ? ?REFERRING PROVIDER: [name], MD ? ?REFERRING DIAG: R26.9 (ICD-10-CM) - Unspecified abnormalities of gait and mobility ? ?THERAPY DIAG:  ?Other abnormalities of gait and mobility ? ?Repeated falls ? ?Muscle weakness (generalized) ? ?ONSET DATE: declining mobility over the past 2 years  ? ?SUBJECTIVE:  ? ?SUBJECTIVE STATEMENT: ?The patient reports she is tired this morning after waking up ealry but overall she is doing OK this morning.  ? ? ? ?Patient has a long histroy of medical complications starting about 20 years ago. She fell and  broke her left arm and developed lymphadema in her arm. At this time they checked her knees to see why she fell. It was found that she had significant arthrtis but no pain. She went 14 years before requiring knee replacements. She did well with her replacements. She was an avid water walker until Covid. Around that time she alos develped tardive dykenisia. She reports she has had a progressive loss of balance since that point. She fell a few days ago. She feels like that came just from running into something. She uses a walker most of the time.  ? ?PERTINENT HISTORY: ?Tardive dyskenisia, Bilateral Knee replacement ( APROX 6 YEARS AGO); lymphedema, OA (multi joint but doesn't have pain with it) lymphedema  ? ?PAIN:  ?Are you having pain? Yes 3/13 ?NPRS scale: 0/10 ?Pain location: back  ?Pain orientation: Right and Left  ?PAIN TYPE: aching ?Pain description: intermittent  ?Aggravating factors: walking  ?Relieving factors: rest  ? ?Can have pain in both shoulders at times;  ? ?PRECAUTIONS: Fall; has had major syncope in the past but not in the past year. She has a loop recorder at this time  ? ?WEIGHT BEARING RESTRICTIONS No ? ?FALLS:  ?Has patient fallen in last 6 months? Yes, Number of falls: 3  ? ?LIVING ENVIRONMENT: ?3 steps into the house  ? ?OCCUPATION: Retired  ? ?Hobbies:  ?Water walking; Art; reading  ? ? ?PLOF: Independent with household mobility with device uses Rolator  ? ?PATIENT GOALS  ?Improve strength, ability to walk, stamina and balance  ? ? ?OBJECTIVE:  ? ? ? ?TODAY'S TREATMENT: ?3/27 ? ?Nu-step 6 min L3  ?Supine march 2x15 ?Supine hip abdcution 2x15 green  ?LTR x20  ? ?LAQ 2x15  ? ? ?Narrow base of support 2x30 sec hold  ?Narrow base of support eyes closed 2x30 sec hold  ? ?Narrow base on air-ex eyes open 3x20 sec hold  ?Narrow base eyes closed 2x20 sec hold  ? ?Step onto air-ex 2x10 each leg ? ? ? ?3/22 ?Nu-step  ?Supine march 2x10 ?Supine hip abdcution 2x10  ?LTR x20  ? ?Bilateral ER red 2x10   ?Horizontal abduction red 2x10  ?Shoulder flexion 2x10 red wand  ? ?Balance: ? ?Narrow base of support 2x30 sec hold  ?Narrow base of support eyes closed 2x30 sec hold  ? ?Narrow base on air-ex eyes open 3x20 sec hold  ? ?Heel toe air-ex x20;  ? ?Tandem left and right on air-ex 3x20 sec hold  ? ? ? ?3/15 ?Ex-bike 3:30: had some difficulty keeping her feet in the bike  ? ?Bilateral ER yellow 2x10  ?Horizontal abduction 2x10  ?Shoulder flexion 2x10 wand  ?Biceps curl 2x10  ? ?Narrow base of support 2x30 sec hold  ?Narrow base of support eyes closed 2x30 sec hold  ? ?Box steps 4 way x10  ?Forward and  back rythmic step x10 each way  ? ?Standing on air-ex with perturbations 30 sec  ? ?3/9 ? ?Nu-step: L2 5 min UE /LE  ?Supine:  ?Ball squeeze 3x10  ?Hip abduction band red 3x10  ?Supine march 3x10  ?SLR 2x5 on the riht 2x10 on the left  ? ?Standing:  ?Narrow base of support 2x30 sec hold  ?Narrow base of support eyes closed 2x30 sec hold  ? ?Forward and back step with rhythm x20  ?Box step with rhythm x10 each way ? ? ? ? ? ? ? ? ? ? ? ? ?PATIENT EDUCATION:  ?Education details:  Benefits of water training; progression of activity; HEP; balance training  ?Person educated: Patient ?Education method: Explanation, Demonstration, Tactile cues, Verbal cues, and Handouts ?Education comprehension: verbalized understanding, returned demonstration, verbal cues required, tactile cues required, and needs further education ? ? ?HOME EXERCISE PROGRAM: ?Access Code: [email or web address] ?URL: [email or web address] ?Date: 01/18/2022 ?Prepared by: Carolyne Littles ? ?Exercises ?Seated Knee Extension with Resistance - 1 x daily - 7 x weekly - 3 sets - 10 reps ?Seated Hip Abduction with Resistance - 1 x daily - 7 x weekly - 3 sets - 10 reps ?Seated Knee Extension with Resistance - 1 x daily - 7 x weekly - 3 sets - 10 reps ? ? ?ASSESSMENT: ? ?CLINICAL IMPRESSION: ?The patient continues to tolerate treatment well. Therapy advanced her to  movement onto the air-ex and perturbations on the air-ex. She requires guarding for both but that is to be expected. We worked on a supine series of exercises today. We will continue to advance balance exercises as toler

## 2022-02-11 NOTE — Progress Notes (Signed)
[facility] Summary Report / Loop Recorder 

## 2022-02-18 ENCOUNTER — Encounter (HOSPITAL_BASED_OUTPATIENT_CLINIC_OR_DEPARTMENT_OTHER): Payer: Self-pay | Admitting: Physical Therapy

## 2022-02-18 ENCOUNTER — Ambulatory Visit (HOSPITAL_BASED_OUTPATIENT_CLINIC_OR_DEPARTMENT_OTHER): Payer: Medicare Other | Attending: Geriatric Medicine | Admitting: Physical Therapy

## 2022-02-18 DIAGNOSIS — M6281 Muscle weakness (generalized): Secondary | ICD-10-CM | POA: Diagnosis not present

## 2022-02-18 DIAGNOSIS — R296 Repeated falls: Secondary | ICD-10-CM | POA: Insufficient documentation

## 2022-02-18 DIAGNOSIS — R2689 Other abnormalities of gait and mobility: Secondary | ICD-10-CM | POA: Diagnosis not present

## 2022-02-18 NOTE — Therapy (Signed)
?OUTPATIENT PHYSICAL THERAPY LOWER EXTREMITY Treatment  ? ? ?Patient Name: [name] [name] ?MRN: [id number] ?DOB:07-19-1946, 76 y.o., female ?Today's Date: 02/18/2022 ? ? PT End of Session - 02/18/22 1010   ? ? Visit Number 6   ? Number of Visits 16   ? Date for PT Re-Evaluation 03/01/22   ? Authorization Type MCR a and B progress note at 10 Kx at 15   ? PT Start Time 0930   ? PT Stop Time 1014   ? PT Time Calculation (min) 44 min   ? Activity Tolerance Patient tolerated treatment well   ? Behavior During Therapy [facility] for tasks assessed/performed   ? ?  ?  ? ?  ? ? ? ? ? ? ?Past Medical History:  ?Diagnosis Date  ? Allergic rhinitis   ? Bipolar disorder ([place])   ? Cholelithiasis   ? seen on CAT 01/04/09  ? Chronic renal disease, stage III ([place])   ? 3/12  ? Complication of anesthesia   ? versed and fentanyl did not work for colonoscopy  ? Diabetic peripheral neuropathy ([place]) 07/18/2020  ? DM (diabetes mellitus) ([place])   ? Edema   ? Gait abnormality 08/26/2019  ? HLD (hyperlipidemia)   ? HTN (hypertension)   ? Iron deficiency anemia   ? 3/12   ? Lymphedema   ? tarda  ? Neuropathy   ? Obstructive sleep apnea on CPAP   ? Osteoarthritis   ? Renal cyst   ? 2.7 cm seen on CAT 01/04/09  ? Tardive dyskinesia   ? ?Past Surgical History:  ?Procedure Laterality Date  ? ABDOMINAL HYSTERECTOMY    ? BALLOON DILATION N/A 04/24/2020  ? Procedure: BALLOON DILATION;  Surgeon: Ronnette Juniper, MD;  Location: [facility];  Service: Gastroenterology;  Laterality: N/A;  ? BIOPSY  04/24/2020  ? Procedure: BIOPSY;  Surgeon: Ronnette Juniper, MD;  Location: [facility];  Service: Gastroenterology;;  ? BOTOX INJECTION N/A 04/24/2020  ? Procedure: POSSIBLE BOTOX INJECTION;  Surgeon: Ronnette Juniper, MD;  Location: [facility];  Service: Gastroenterology;  Laterality: N/A;  ? COLONOSCOPY  2009; 07/09/11  ? 2009:normal (polyp was not adenoma); 2012:   ? [place]    ? ESOPHAGEAL MANOMETRY N/A 05/31/2020  ? Procedure: ESOPHAGEAL MANOMETRY (EM);   Surgeon: Ronnette Juniper, MD;  Location: [facility];  Service: Gastroenterology;  Laterality: N/A;  ? ESOPHAGOGASTRODUODENOSCOPY  07/09/11  ? Erosive gastritis  ? ESOPHAGOGASTRODUODENOSCOPY (EGD) WITH PROPOFOL N/A 04/24/2020  ? Procedure: ESOPHAGOGASTRODUODENOSCOPY (EGD) WITH PROPOFOL;  Surgeon: Ronnette Juniper, MD;  Location: [facility];  Service: Gastroenterology;  Laterality: N/A;  ? FRACTURE SURGERY    ? rt fx upper arm  ? GIVENS CAPSULE STUDY  07/25/2011  ? normal small bowel  ? REIMPLANT URETER IN BLADDER  1996  ? TRIGGER FINGER RELEASE Left 02/04/2013  ? Procedure: RELEASE TRIGGER FINGER/A-1 PULLEY LEFT RING FINGER;  Surgeon: Tennis Must, MD;  Location: [facility];  Service: Orthopedics;  Laterality: Left;  ? ?Patient Active Problem List  ? Diagnosis Date Noted  ? NSVT (nonsustained ventricular tachycardia) ([place]) 02/24/2021  ? Bifascicular block 02/24/2021  ? Fall at home 10/31/2020  ? Tardive dyskinesia 07/18/2020  ? Diabetic peripheral neuropathy ([place]) 07/18/2020  ? Gait abnormality 08/26/2019  ? Unspecified gastritis and gastroduodenitis without mention of hemorrhage 07/09/2011  ? Personal history of failed moderate sedation 05/20/2011  ? ACUTE CYSTITIS 02/23/2008  ? HYPERLIPIDEMIA 10/30/2007  ? DIABETES MELLITUS, TYPE II 07/08/2007  ? Iron  deficiency anemia, unspecified 07/08/2007  ? DEPRESSION 07/08/2007  ? HYPERTENSION 07/08/2007  ? ? ?PCP: [name], MD ? ?REFERRING PROVIDER: [name], MD ? ?REFERRING DIAG: R26.9 (ICD-10-CM) - Unspecified abnormalities of gait and mobility ? ?THERAPY DIAG:  ?Other abnormalities of gait and mobility ? ?Repeated falls ? ?Muscle weakness (generalized) ? ?ONSET DATE: declining mobility over the past 2 years  ? ?SUBJECTIVE:  ? ?SUBJECTIVE STATEMENT: ?The patient has no major complaints today> She does report feeling tired overall. She did not sleep well last night. She does not know why.  ? ? ? ?Patient has a long histroy of medical complications starting  about 20 years ago. She fell and broke her left arm and developed lymphadema in her arm. At this time they checked her knees to see why she fell. It was found that she had significant arthrtis but no pain. She went 14 years before requiring knee replacements. She did well with her replacements. She was an avid water walker until Covid. Around that time she alos develped tardive dykenisia. She reports she has had a progressive loss of balance since that point. She fell a few days ago. She feels like that came just from running into something. She uses a walker most of the time.  ? ?PERTINENT HISTORY: ?Tardive dyskenisia, Bilateral Knee replacement ( APROX 6 YEARS AGO); lymphedema, OA (multi joint but doesn't have pain with it) lymphedema  ? ?PAIN:  ?Are you having pain? Yes 4/3 no specific pain some general aches and pains  ?NPRS scale: 0/10 ?Pain location: back  ?Pain orientation: Right and Left  ?PAIN TYPE: aching ?Pain description: intermittent  ?Aggravating factors: walking  ?Relieving factors: rest  ? ?Can have pain in both shoulders at times;  ? ?PRECAUTIONS: Fall; has had major syncope in the past but not in the past year. She has a loop recorder at this time  ? ?WEIGHT BEARING RESTRICTIONS No ? ?FALLS:  ?Has patient fallen in last 6 months? Yes, Number of falls: 3  ? ?LIVING ENVIRONMENT: ?3 steps into the house  ? ?OCCUPATION: Retired  ? ?Hobbies:  ?Water walking; Art; reading  ? ? ?PLOF: Independent with household mobility with device uses Rolator  ? ?PATIENT GOALS  ?Improve strength, ability to walk, stamina and balance  ? ? ?OBJECTIVE:  ?4/3 narrow base eyes closed improved to SBA  ? ? ?TODAY'S TREATMENT: ?4/3 ?Nu-step 6 min L3  ? ?Supine march 2x15 ?Supine hip abdcution 2x15 green  ?LTR x20  ? ? ?Narrow base of support 2x30 sec hold  SBA  ?Narrow base of support eyes closed 2x30 sec hold SBA  ? ?Tandem stance bilateral 2x30 sec hold CGA  ? ?Narrow base on air-ex eyes open 3x20 sec hold mod a first trial   CGA 2nd trial  ? ? ? ?3/27 ? ?Nu-step 6 min L3  ?Supine march 2x15 ?Supine hip abdcution 2x15 green  ?LTR x20  ? ?LAQ 2x15  ? ? ?Narrow base of support 2x30 sec hold  ?Narrow base of support eyes closed 2x30 sec hold  ? ?Narrow base on air-ex eyes open 3x20 sec hold  ?Narrow base eyes closed 2x20 sec hold  ? ?Step onto air-ex 2x10 each leg ? ? ? ?3/22 ?Nu-step  ?Supine march 2x10 ?Supine hip abdcution 2x10  ?LTR x20  ? ?Bilateral ER red 2x10  ?Horizontal abduction red 2x10  ?Shoulder flexion 2x10 red wand  ? ?Balance: ? ?Narrow base of support 2x30 sec hold  ?Narrow base of support  eyes closed 2x30 sec hold  ? ?Narrow base on air-ex eyes open 3x20 sec hold  ? ?Heel toe air-ex x20;  ? ?Tandem left and right on air-ex 3x20 sec hold  ? ? ? ? ? ?PATIENT EDUCATION:  ?Education details:  reviewed balance exercises and how they help  ?Person educated: Patient ?Education method: Explanation, Demonstration, Tactile cues, Verbal cues, and Handouts ?Education comprehension: verbalized understanding, returned demonstration, verbal cues required, tactile cues required, and needs further education ? ? ?HOME EXERCISE PROGRAM: ?Access Code: [email or web address] ?URL: [email or web address] ?Date: 01/18/2022 ?Prepared by: Carolyne Littles ? ?Exercises ?Seated Knee Extension with Resistance - 1 x daily - 7 x weekly - 3 sets - 10 reps ?Seated Hip Abduction with Resistance - 1 x daily - 7 x weekly - 3 sets - 10 reps ?Seated Knee Extension with Resistance - 1 x daily - 7 x weekly - 3 sets - 10 reps ? ? ?ASSESSMENT: ? ?CLINICAL IMPRESSION: ?The patient continues to tolerate treatment well. Therapy advanced her to movement onto the air-ex and perturbations on the air-ex. She requires guarding for both but that is to be expected. We worked on a supine series of exercises today. We will continue to advance balance exercises as tolerated.  ?OBJECTIVE IMPAIRMENTS Abnormal gait, decreased activity tolerance, decreased balance, decreased  coordination, decreased endurance, decreased mobility, difficulty walking, decreased ROM, decreased strength, impaired UE functional use, improper body mechanics, and pain.  ? ?ACTIVITY LIMITATIONS cleaning, commun

## 2022-02-26 ENCOUNTER — Ambulatory Visit (HOSPITAL_BASED_OUTPATIENT_CLINIC_OR_DEPARTMENT_OTHER): Payer: Medicare Other | Admitting: Physical Therapy

## 2022-03-01 ENCOUNTER — Encounter: Payer: Self-pay | Admitting: Internal Medicine

## 2022-03-04 ENCOUNTER — Ambulatory Visit (INDEPENDENT_AMBULATORY_CARE_PROVIDER_SITE_OTHER): Payer: Medicare Other

## 2022-03-04 DIAGNOSIS — R55 Syncope and collapse: Secondary | ICD-10-CM

## 2022-03-05 ENCOUNTER — Encounter (HOSPITAL_BASED_OUTPATIENT_CLINIC_OR_DEPARTMENT_OTHER): Payer: Self-pay | Admitting: Physical Therapy

## 2022-03-05 ENCOUNTER — Ambulatory Visit (HOSPITAL_BASED_OUTPATIENT_CLINIC_OR_DEPARTMENT_OTHER): Payer: Medicare Other | Admitting: Physical Therapy

## 2022-03-05 DIAGNOSIS — M6281 Muscle weakness (generalized): Secondary | ICD-10-CM | POA: Diagnosis not present

## 2022-03-05 DIAGNOSIS — R2689 Other abnormalities of gait and mobility: Secondary | ICD-10-CM

## 2022-03-05 DIAGNOSIS — R296 Repeated falls: Secondary | ICD-10-CM | POA: Diagnosis not present

## 2022-03-05 DIAGNOSIS — F3181 Bipolar II disorder: Secondary | ICD-10-CM | POA: Diagnosis not present

## 2022-03-05 DIAGNOSIS — Z20822 Contact with and (suspected) exposure to covid-19: Secondary | ICD-10-CM | POA: Diagnosis not present

## 2022-03-05 LAB — CUP PACEART REMOTE DEVICE CHECK
Date Time Interrogation Session: 20230414230909
Implantable Pulse Generator Implant Date: 20220411

## 2022-03-05 NOTE — Therapy (Addendum)
?OUTPATIENT PHYSICAL THERAPY LOWER EXTREMITY Treatment /Progress Note  ? ? ?Patient Name: [name] [name] ?MRN: [id number] ?DOB:Oct 28, 1946, 76 y.o., female ?Today's Date: 03/05/2022 ? ? PT End of Session - 03/05/22 1104   ? ? Visit Number 7   ? Number of Visits 16   ? Date for PT Re-Evaluation 03/01/22   ? Authorization Type MCR a and B progress note at 10 Kx at 15   ? PT Start Time 1100   ? PT Stop Time 1140   ? PT Time Calculation (min) 40 min   ? Activity Tolerance Patient tolerated treatment well   ? Behavior During Therapy [facility] for tasks assessed/performed   ? ?  ?  ? ?  ? ?Progress Note ?Reporting Period 01/18/2022 to 03/05/2022 ? ?See note below for Objective Data and Assessment of Progress/Goals.  ? ?  ? ? ? ? ?Past Medical History:  ?Diagnosis Date  ? Allergic rhinitis   ? Bipolar disorder ([place])   ? Cholelithiasis   ? seen on CAT 01/04/09  ? Chronic renal disease, stage III ([place])   ? 3/12  ? Complication of anesthesia   ? versed and fentanyl did not work for colonoscopy  ? Diabetic peripheral neuropathy ([place]) 07/18/2020  ? DM (diabetes mellitus) ([place])   ? Edema   ? Gait abnormality 08/26/2019  ? HLD (hyperlipidemia)   ? HTN (hypertension)   ? Iron deficiency anemia   ? 3/12   ? Lymphedema   ? tarda  ? Neuropathy   ? Obstructive sleep apnea on CPAP   ? Osteoarthritis   ? Renal cyst   ? 2.7 cm seen on CAT 01/04/09  ? Tardive dyskinesia   ? ?Past Surgical History:  ?Procedure Laterality Date  ? ABDOMINAL HYSTERECTOMY    ? BALLOON DILATION N/A 04/24/2020  ? Procedure: BALLOON DILATION;  Surgeon: Ronnette Juniper, MD;  Location: [facility];  Service: Gastroenterology;  Laterality: N/A;  ? BIOPSY  04/24/2020  ? Procedure: BIOPSY;  Surgeon: Ronnette Juniper, MD;  Location: [facility];  Service: Gastroenterology;;  ? BOTOX INJECTION N/A 04/24/2020  ? Procedure: POSSIBLE BOTOX INJECTION;  Surgeon: Ronnette Juniper, MD;  [facility];  Service: Gastroenterology;  Laterality: N/A;  ? COLONOSCOPY  2009; 07/09/11  ? 2009:normal (polyp was  not adenoma); 2012:   ? [place]    ? ESOPHAGEAL MANOMETRY N/A 05/31/2020  ? Procedure: ESOPHAGEAL MANOMETRY (EM);  Surgeon: Ronnette Juniper, MD;  [facility]: [facility];  Service: Gastroenterology;  Laterality: N/A;  ? ESOPHAGOGASTRODUODENOSCOPY  07/09/11  ? Erosive gastritis  ? ESOPHAGOGASTRODUODENOSCOPY (EGD) WITH PROPOFOL N/A 04/24/2020  ? Procedure: ESOPHAGOGASTRODUODENOSCOPY (EGD) WITH PROPOFOL;  Surgeon: Ronnette Juniper, MD;  [facility];  Service: Gastroenterology;  Laterality: N/A;  ? FRACTURE SURGERY    ? rt fx upper arm  ? GIVENS CAPSULE STUDY  07/25/2011  ? normal small bowel  ? REIMPLANT URETER IN BLADDER  1996  ? TRIGGER FINGER RELEASE Left 02/04/2013  ? Procedure: RELEASE TRIGGER FINGER/A-1 PULLEY LEFT RING FINGER;  Surgeon: Tennis Must, MD;  Location: [facility];  Service: Orthopedics;  Laterality: Left;  ? ?Patient Active Problem List  ? Diagnosis Date Noted  ? NSVT (nonsustained ventricular tachycardia) ([place]) 02/24/2021  ? Bifascicular block 02/24/2021  ? Fall at home 10/31/2020  ? Tardive dyskinesia 07/18/2020  ? Diabetic peripheral neuropathy ([place]) 07/18/2020  ? Gait abnormality 08/26/2019  ? Unspecified gastritis and gastroduodenitis without mention of hemorrhage 07/09/2011  ? Personal history of  failed moderate sedation 05/20/2011  ? ACUTE CYSTITIS 02/23/2008  ? HYPERLIPIDEMIA 10/30/2007  ? DIABETES MELLITUS, TYPE II 07/08/2007  ? Iron deficiency anemia, unspecified 07/08/2007  ? DEPRESSION 07/08/2007  ? HYPERTENSION 07/08/2007  ? ? ?PCP: [name], MD ? ?REFERRING PROVIDER: [name], MD ? ?REFERRING DIAG: R26.9 (ICD-10-CM) - Unspecified abnormalities of gait and mobility ? ?THERAPY DIAG:  ?Other abnormalities of gait and mobility ? ?Repeated falls ? ?Muscle weakness (generalized) ? ?ONSET DATE: declining mobility over the past 2 years  ? ?SUBJECTIVE:  ? ?SUBJECTIVE STATEMENT: ?Pt reports she had a fall 3 days ago while carrying a heavy basket;  landed on Rt hip.  She reports her mobility has been worse since the fall.  She ambulates into therapy without AD, reporting she can't lift her walker out of the car. Pt reports she did a lot of water walking up until 3.5 years ago when pandemic started.  ? ? ?PERTINENT HISTORY: ?Tardive dyskenisia, Bilateral Knee replacement ( APROX 6 YEARS AGO); lymphedema, OA (multi joint but doesn't have pain with it) lymphedema  ? ?PAIN:  ?Are you having pain? No ?"Just exhausted" ?NPRS scale: 0/10 ?Pain location: ?Pain orientation:  ?PAIN TYPE: aching ?Pain description: intermittent  ?Aggravating factors: walking  ?Relieving factors: rest  ? ?Can have pain in both shoulders at times;  ? ?PRECAUTIONS: Fall; has had major syncope in the past but not in the past year. She has a loop recorder at this time (phone needs to be 3-5 ft from her to record)  ? ?WEIGHT BEARING RESTRICTIONS No ? ?FALLS:  ?Has patient fallen in last 6 months? Yes, Number of falls: 3  ? ?LIVING ENVIRONMENT: ?3 steps into the house  ? ?OCCUPATION: Retired  ? ?Hobbies:  ?Water walking; Art; reading  ? ? ?PLOF: Independent with household mobility with device uses Rolator  ? ?PATIENT GOALS  ?Improve strength, ability to walk, stamina and balance  ? ? ?OBJECTIVE:  ? ?FOTO: 03/05/22 Functional score 47.    41 at eval; predicted score at visit 12 = 47.  ? ?LE MMT: ?  ?MMT Right ?01/19/2022 Left ?01/19/2022 Right  ?03/05/22 Left  ?03/05/22  ?Hip flexion 13.6 13.9 21.1 26.5  ?Hip extension        ?Hip abduction 31.1 20.4 24.5 27.4  ?Hip adduction        ?Hip internal rotation        ?Hip external rotation        ?Knee flexion        ?Knee extension 21.5 17.4 27.8 26.9  ?Ankle dorsiflexion        ?Ankle plantarflexion        ?Ankle inversion        ?Ankle eversion        ? ? ?TODAY'S TREATMENT: ?4/18 ?Pt seen for aquatic therapy today.  Treatment took place in water 3.25-4 ft in depth at the [facility] pool. Temp of water was 91?.  Pt entered/exited the pool via  stairs independently with bilat rail. ? ? UE support on yellow noodle: Forward/ backward and sidestepping  -3-4 laps each; high knee marching forward/ backward ?Tandem stance x 20 sec each leg forward ?Feet together x 30 sec with horiz head turns ?SLS (without support) x 15 sec each ?Pushing rainbow dumbbell under water x 10 ?Holding onto wall:  Heel raises x 15; squats x 10; hip abdct x 10 each leg; hip ext x 10 each leg; hip flexion (with knee ext) x 10 each  leg; hip abdct/knee flexion x 10 each leg ? ?Pt requires buoyancy for support and to offload joints with strengthening exercises. Viscosity of the water is needed for resistance of strengthening; water current perturbations provides challenge to standing balance unsupported, requiring increased core activation. ? ?Nu-step 6 min L3  ?Supine march 2x15 ?Supine hip abdcution 2x15 green  ?LTR x20  ?Narrow base of support 2x30 sec hold  SBA  ?Narrow base of support eyes closed 2x30 sec hold SBA  ?Tandem stance bilateral 2x30 sec hold CGA  ?Narrow base on air-ex eyes open 3x20 sec hold mod a first trial  CGA 2nd trial  ? ?PATIENT EDUCATION:  ?Education details:  intro to aquatics  ?Person educated: Patient ?Education method: Explanation, Demonstration, Tactile cues, Verbal cues, ?Education comprehension: verbalized understanding, returned demonstration, verbal cues required, tactile cues required, and needs further education ? ? ?HOME EXERCISE PROGRAM: ?Access Code: [email or web address] ?URL: [email or web address] ?Date: 01/18/2022 ?Prepared by: Carolyne Littles ? ?Exercises ?Seated Knee Extension with Resistance - 1 x daily - 7 x weekly - 3 sets - 10 reps ?Seated Hip Abduction with Resistance - 1 x daily - 7 x weekly - 3 sets - 10 reps ?Seated Knee Extension with Resistance - 1 x daily - 7 x weekly - 3 sets - 10 reps ? ? ?ASSESSMENT: ? ?CLINICAL IMPRESSION: ?Pt did well with SBA from therapist in water initially; finished session independently with therapist on  deck. She does better with some support from wall or floatation device.  Pt unsteady with tandem stance and SLS, but able to complete short trial in water without UE support.  No increase in pain anywhere du

## 2022-03-06 DIAGNOSIS — Z20828 Contact with and (suspected) exposure to other viral communicable diseases: Secondary | ICD-10-CM | POA: Diagnosis not present

## 2022-03-07 ENCOUNTER — Ambulatory Visit (HOSPITAL_BASED_OUTPATIENT_CLINIC_OR_DEPARTMENT_OTHER): Payer: Medicare Other | Admitting: Physical Therapy

## 2022-03-07 ENCOUNTER — Encounter (HOSPITAL_BASED_OUTPATIENT_CLINIC_OR_DEPARTMENT_OTHER): Payer: Self-pay | Admitting: Physical Therapy

## 2022-03-07 DIAGNOSIS — R296 Repeated falls: Secondary | ICD-10-CM | POA: Diagnosis not present

## 2022-03-07 DIAGNOSIS — M6281 Muscle weakness (generalized): Secondary | ICD-10-CM | POA: Diagnosis not present

## 2022-03-07 DIAGNOSIS — R2689 Other abnormalities of gait and mobility: Secondary | ICD-10-CM | POA: Diagnosis not present

## 2022-03-07 NOTE — Addendum Note (Signed)
Addended by: Carney Living on: 03/07/2022 08:12 AM ? ? Modules accepted: Orders ? ?

## 2022-03-07 NOTE — Therapy (Signed)
?OUTPATIENT PHYSICAL THERAPY LOWER EXTREMITY Treatment  ? ? ? ?Patient Name: [name] [name] ?MRN: [id number] ?DOB:1946/07/03, 76 y.o., female ?Today's Date: 03/07/2022 ? ? PT End of Session - 03/07/22 1055   ? ? Visit Number 8   ? Number of Visits 19   ? Date for PT Re-Evaluation 04/18/22   ? Authorization Type MCR a and B progress note at 10 Kx at 15   ? PT Start Time 1055   ? PT Stop Time 1140   ? PT Time Calculation (min) 45 min   ? Activity Tolerance Patient tolerated treatment well   ? Behavior During Therapy [facility] for tasks assessed/performed   ? ?  ?  ? ?  ? ? ? ? ? ?Past Medical History:  ?Diagnosis Date  ? Allergic rhinitis   ? Bipolar disorder ([place])   ? Cholelithiasis   ? seen on CAT 01/04/09  ? Chronic renal disease, stage III ([place])   ? 3/12  ? Complication of anesthesia   ? versed and fentanyl did not work for colonoscopy  ? Diabetic peripheral neuropathy ([place]) 07/18/2020  ? DM (diabetes mellitus) ([place])   ? Edema   ? Gait abnormality 08/26/2019  ? HLD (hyperlipidemia)   ? HTN (hypertension)   ? Iron deficiency anemia   ? 3/12   ? Lymphedema   ? tarda  ? Neuropathy   ? Obstructive sleep apnea on CPAP   ? Osteoarthritis   ? Renal cyst   ? 2.7 cm seen on CAT 01/04/09  ? Tardive dyskinesia   ? ?Past Surgical History:  ?Procedure Laterality Date  ? ABDOMINAL HYSTERECTOMY    ? BALLOON DILATION N/A 04/24/2020  ? Procedure: BALLOON DILATION;  Surgeon: Ronnette Juniper, MD;  Location: [facility];  Service: Gastroenterology;  Laterality: N/A;  ? BIOPSY  04/24/2020  ? Procedure: BIOPSY;  Surgeon: Ronnette Juniper, MD;  Location: [facility];  Service: Gastroenterology;;  ? BOTOX INJECTION N/A 04/24/2020  ? Procedure: POSSIBLE BOTOX INJECTION;  Surgeon: Ronnette Juniper, MD;  Location: [facility];  Service: Gastroenterology;  Laterality: N/A;  ? COLONOSCOPY  2009; 07/09/11  ? 2009:normal (polyp was not adenoma); 2012:   ? [place]    ? ESOPHAGEAL MANOMETRY N/A 05/31/2020  ? Procedure: ESOPHAGEAL MANOMETRY (EM);   Surgeon: Ronnette Juniper, MD;  Location: [facility];  Service: Gastroenterology;  Laterality: N/A;  ? ESOPHAGOGASTRODUODENOSCOPY  07/09/11  ? Erosive gastritis  ? ESOPHAGOGASTRODUODENOSCOPY (EGD) WITH PROPOFOL N/A 04/24/2020  ? Procedure: ESOPHAGOGASTRODUODENOSCOPY (EGD) WITH PROPOFOL;  Surgeon: Ronnette Juniper, MD;  Location: [facility];  Service: Gastroenterology;  Laterality: N/A;  ? FRACTURE SURGERY    ? rt fx upper arm  ? GIVENS CAPSULE STUDY  07/25/2011  ? normal small bowel  ? REIMPLANT URETER IN BLADDER  1996  ? TRIGGER FINGER RELEASE Left 02/04/2013  ? Procedure: RELEASE TRIGGER FINGER/A-1 PULLEY LEFT RING FINGER;  Surgeon: Tennis Must, MD;  Location: [place];  Service: Orthopedics;  Laterality: Left;  ? ?Patient Active Problem List  ? Diagnosis Date Noted  ? NSVT (nonsustained ventricular tachycardia) ([place]) 02/24/2021  ? Bifascicular block 02/24/2021  ? Fall at home 10/31/2020  ? Tardive dyskinesia 07/18/2020  ? Diabetic peripheral neuropathy ([place]) 07/18/2020  ? Gait abnormality 08/26/2019  ? Unspecified gastritis and gastroduodenitis without mention of hemorrhage 07/09/2011  ? Personal history of failed moderate sedation 05/20/2011  ? ACUTE CYSTITIS 02/23/2008  ? HYPERLIPIDEMIA 10/30/2007  ? DIABETES MELLITUS, TYPE II 07/08/2007  ? Iron  deficiency anemia, unspecified 07/08/2007  ? DEPRESSION 07/08/2007  ? HYPERTENSION 07/08/2007  ? ? ?PCP: [name], MD ? ?REFERRING PROVIDER: [name], MD ? ?REFERRING DIAG: R26.9 (ICD-10-CM) - Unspecified abnormalities of gait and mobility ? ?THERAPY DIAG:  ?Other abnormalities of gait and mobility ? ?Repeated falls ? ?Muscle weakness (generalized) ? ?ONSET DATE: declining mobility over the past 2 years  ? ?SUBJECTIVE:  ? ?SUBJECTIVE STATEMENT: ?Pt reports she was able to park outside and allowed plenty of time to get to appt.  Also, she didn't bring the heavy bag.  She was tired after last session.  ? ? ?PERTINENT HISTORY: ?Tardive dyskenisia,  Bilateral Knee replacement ( APROX 6 YEARS AGO); lymphedema, OA (multi joint but doesn't have pain with it) lymphedema  ? ?PAIN:  ?Are you having pain? No ?"Just exhausted" ?NPRS scale: 0/10 ?Pain location: ?Pain orientation:  ?PAIN TYPE: aching ?Pain description: intermittent  ?Aggravating factors: walking  ?Relieving factors: rest  ? ?Can have pain in both shoulders at times;  ? ?PRECAUTIONS: Fall; has had major syncope in the past but not in the past year. She has a loop recorder at this time (phone needs to be 3-5 ft from her to record)  ? ?WEIGHT BEARING RESTRICTIONS No ? ?FALLS:  ?Has patient fallen in last 6 months? Yes, Number of falls: 3  ? ?LIVING ENVIRONMENT: ?3 steps into the house  ? ?OCCUPATION: Retired  ? ?Hobbies:  ?Water walking; Art; reading  ? ? ?PLOF: Independent with household mobility with device uses Rolator  ? ?PATIENT GOALS  ?Improve strength, ability to walk, stamina and balance  ? ? ?OBJECTIVE:  ? ?FOTO: 03/05/22 Functional score 47.    41 at eval; predicted score at visit 12 = 47.  ? ?LE MMT: ?  ?MMT Right ?01/19/2022 Left ?01/19/2022 Right  ?03/05/22 Left  ?03/05/22  ?Hip flexion 13.6 13.9 21.1 26.5  ?Hip extension        ?Hip abduction 31.1 20.4 24.5 27.4  ?Hip adduction        ?Hip internal rotation        ?Hip external rotation        ?Knee flexion        ?Knee extension 21.5 17.4 27.8 26.9  ?Ankle dorsiflexion        ?Ankle plantarflexion        ?Ankle inversion        ?Ankle eversion        ? ? ?TODAY'S TREATMENT: ?4/20 ?Pt seen for aquatic therapy today.  Treatment took place in water 3.25-4 ft in depth at the [facility] pool. Temp of water was 91?.  Pt entered/exited the pool via stairs independently with bilat rail. ? ?UE on wall: Side stepping  ?Without support Forward/ backward  ?With UE on yellow noodle: high knee marching forward/ backward ?Pushing rainbow dumbbell under water x 10 ?Holding onto wall:  Heel/toes raises x 20; squats x 12; hip abdct x 12 each leg; hip ext x  12 each leg; hip flexion (with knee ext) x 10 each leg; hip abdct/knee flexion x 10 each leg ?Repeated forward/ backward walking with yellow noodle  ?SLS (with rainbow dumbbells) x 15-20 sec each leg forward,  ?Tandem stance x 20 sec each leg forward, holding onto rainbow dumbbells, 2 reps ?Feet together x 30 sec with horiz head turns, 2 reps, one with rainbow dumbbells, one without ?Sit to/from stand with blue step under feet x 10 ?Forward step ups x 5 each LE with  unilateral UE on wall ?Forward walking without UE support for recovery ? ? ?Pt requires buoyancy for support and to offload joints with strengthening exercises. Viscosity of the water is needed for resistance of strengthening; water current perturbations provides challenge to standing balance unsupported, requiring increased core activation. ? ?PATIENT EDUCATION:  ?Education details:  aquatics progressions, DOMS expectations ?Person educated: Patient ?Education method: Explanation, Demonstration, Tactile cues, Verbal cues, ?Education comprehension: verbalized understanding, returned demonstration, verbal cues required, tactile cues required, and needs further education ? ? ?HOME EXERCISE PROGRAM: ?Access Code: [email or web address] ?URL: [email or web address] ?Date: 01/18/2022 ?Prepared by: Carolyne Littles ? ?Exercises ?Seated Knee Extension with Resistance - 1 x daily - 7 x weekly - 3 sets - 10 reps ?Seated Hip Abduction with Resistance - 1 x daily - 7 x weekly - 3 sets - 10 reps ?Seated Knee Extension with Resistance - 1 x daily - 7 x weekly - 3 sets - 10 reps ? ? ?ASSESSMENT: ? ?CLINICAL IMPRESSION: ?Pt allowed for ample time to get to appt that it set up for a successful session; not fatigued prior to beginning.  Improved confidence in aquatic setting today.  She was able to complete some exercises with less UE support without LOB. She was able to complete increased volume of exercise without difficulty. She is challenged with Lt forward step ups.  No  increase in pain anywhere during session.   She is progressing gradually towards remaining goals and may benefit from continued PT intervention to maximize functional mobility and safety.  ? ?OBJECTIVE IMPAIRME

## 2022-03-11 ENCOUNTER — Ambulatory Visit (HOSPITAL_BASED_OUTPATIENT_CLINIC_OR_DEPARTMENT_OTHER): Payer: Medicare Other | Admitting: Physical Therapy

## 2022-03-13 ENCOUNTER — Ambulatory Visit (HOSPITAL_BASED_OUTPATIENT_CLINIC_OR_DEPARTMENT_OTHER): Payer: Medicare Other | Admitting: Physical Therapy

## 2022-03-15 ENCOUNTER — Ambulatory Visit (HOSPITAL_BASED_OUTPATIENT_CLINIC_OR_DEPARTMENT_OTHER): Payer: Medicare Other | Admitting: Physical Therapy

## 2022-03-15 ENCOUNTER — Encounter (HOSPITAL_BASED_OUTPATIENT_CLINIC_OR_DEPARTMENT_OTHER): Payer: Self-pay | Admitting: Physical Therapy

## 2022-03-15 DIAGNOSIS — R2689 Other abnormalities of gait and mobility: Secondary | ICD-10-CM

## 2022-03-15 DIAGNOSIS — M6281 Muscle weakness (generalized): Secondary | ICD-10-CM | POA: Diagnosis not present

## 2022-03-15 DIAGNOSIS — R296 Repeated falls: Secondary | ICD-10-CM

## 2022-03-15 NOTE — Therapy (Signed)
?OUTPATIENT PHYSICAL THERAPY LOWER EXTREMITY Treatment  ? ? ? ?Patient Name: [name] [name] ?MRN: [id number] ?DOB:1946/04/11, 76 y.o., female ?Today's Date: 03/15/2022 ? ? PT End of Session - 03/15/22 1101   ? ? Visit Number 9   ? Number of Visits 19   ? Date for PT Re-Evaluation 04/18/22   ? Authorization Type MCR a and B progress note at 10 Kx at 15   ? PT Start Time 1058   ? PT Stop Time 1140   ? PT Time Calculation (min) 42 min   ? Activity Tolerance Patient tolerated treatment well   ? Behavior During Therapy [facility] for tasks assessed/performed   ? ?  ?  ? ?  ? ? ? ? ? ?Past Medical History:  ?Diagnosis Date  ? Allergic rhinitis   ? Bipolar disorder ([place])   ? Cholelithiasis   ? seen on CAT 01/04/09  ? Chronic renal disease, stage III ([place])   ? 3/12  ? Complication of anesthesia   ? versed and fentanyl did not work for colonoscopy  ? Diabetic peripheral neuropathy ([place]) 07/18/2020  ? DM (diabetes mellitus) ([place])   ? Edema   ? Gait abnormality 08/26/2019  ? HLD (hyperlipidemia)   ? HTN (hypertension)   ? Iron deficiency anemia   ? 3/12   ? Lymphedema   ? tarda  ? Neuropathy   ? Obstructive sleep apnea on CPAP   ? Osteoarthritis   ? Renal cyst   ? 2.7 cm seen on CAT 01/04/09  ? Tardive dyskinesia   ? ?Past Surgical History:  ?Procedure Laterality Date  ? ABDOMINAL HYSTERECTOMY    ? BALLOON DILATION N/A 04/24/2020  ? Procedure: BALLOON DILATION;  Surgeon: Ronnette Juniper, MD;  Location: [facility];  Service: Gastroenterology;  Laterality: N/A;  ? BIOPSY  04/24/2020  ? Procedure: BIOPSY;  Surgeon: Ronnette Juniper, MD;  Location: [facility];  Service: Gastroenterology;;  ? BOTOX INJECTION N/A 04/24/2020  ? Procedure: POSSIBLE BOTOX INJECTION;  Surgeon: Ronnette Juniper, MD;  [facility];  Service: Gastroenterology;  Laterality: N/A;  ? COLONOSCOPY  2009; 07/09/11  ? 2009:normal (polyp was not adenoma); 2012:   ? [place]    ? ESOPHAGEAL MANOMETRY N/A 05/31/2020  ? Procedure: ESOPHAGEAL MANOMETRY (EM);   Surgeon: Ronnette Juniper, MD;  Location: [facility];  Service: Gastroenterology;  Laterality: N/A;  ? ESOPHAGOGASTRODUODENOSCOPY  07/09/11  ? Erosive gastritis  ? ESOPHAGOGASTRODUODENOSCOPY (EGD) WITH PROPOFOL N/A 04/24/2020  ? Procedure: ESOPHAGOGASTRODUODENOSCOPY (EGD) WITH PROPOFOL;  Surgeon: Ronnette Juniper, MD;  Location: [facility];  Service: Gastroenterology;  Laterality: N/A;  ? FRACTURE SURGERY    ? rt fx upper arm  ? GIVENS CAPSULE STUDY  07/25/2011  ? normal small bowel  ? REIMPLANT URETER IN BLADDER  1996  ? TRIGGER FINGER RELEASE Left 02/04/2013  ? Procedure: RELEASE TRIGGER FINGER/A-1 PULLEY LEFT RING FINGER;  Surgeon: Tennis Must, MD;  Location: [place];  Service: Orthopedics;  Laterality: Left;  ? ?Patient Active Problem List  ? Diagnosis Date Noted  ? NSVT (nonsustained ventricular tachycardia) ([place]) 02/24/2021  ? Bifascicular block 02/24/2021  ? Fall at home 10/31/2020  ? Tardive dyskinesia 07/18/2020  ? Diabetic peripheral neuropathy ([place]) 07/18/2020  ? Gait abnormality 08/26/2019  ? Unspecified gastritis and gastroduodenitis without mention of hemorrhage 07/09/2011  ? Personal history of failed moderate sedation 05/20/2011  ? ACUTE CYSTITIS 02/23/2008  ? HYPERLIPIDEMIA 10/30/2007  ? DIABETES MELLITUS, TYPE II 07/08/2007  ? Iron  deficiency anemia, unspecified 07/08/2007  ? DEPRESSION 07/08/2007  ? HYPERTENSION 07/08/2007  ? ? ?PCP: [name], MD ? ?REFERRING PROVIDER: [name], MD ? ?REFERRING DIAG: R26.9 (ICD-10-CM) - Unspecified abnormalities of gait and mobility ? ?THERAPY DIAG:  ?Other abnormalities of gait and mobility ? ?Repeated falls ? ?Muscle weakness (generalized) ? ?ONSET DATE: declining mobility over the past 2 years  ? ?SUBJECTIVE:  ? ?SUBJECTIVE STATEMENT: ?Pt reports she was sick earlier in the week. She is feeling better today.  She took a short rest break at couch on walk from parking lot to pool. She states she is completing her HEP at home, but not all of  them.  She'd like to return to walking in pool for 2hrs; still needs to figure out what pool she would go to.  ? ? ?PERTINENT HISTORY: ?Tardive dyskenisia, Bilateral Knee replacement ( APROX 6 YEARS AGO); lymphedema, OA (multi joint but doesn't have pain with it) lymphedema  ? ?PAIN:  ?Are you having pain? No ?NPRS scale: 0/10 ?Pain location: ?Pain orientation:  ?PAIN TYPE: aching ?Pain description: intermittent  ?Aggravating factors: walking  ?Relieving factors: rest  ? ?Can have pain in both shoulders at times;  ? ?PRECAUTIONS: Fall; has had major syncope in the past but not in the past year. She has a loop recorder at this time (phone needs to be 3-5 ft from her to record)  ? ?WEIGHT BEARING RESTRICTIONS No ? ?FALLS:  ?Has patient fallen in last 6 months? Yes, Number of falls: 3  ? ?LIVING ENVIRONMENT: ?3 steps into the house  ? ?OCCUPATION: Retired  ? ?Hobbies:  ?Water walking; Art; reading  ? ? ?PLOF: Independent with household mobility with device uses Rolator  ? ?PATIENT GOALS  ?Improve strength, ability to walk, stamina and balance  ? ? ?OBJECTIVE:  ? ?FOTO: 03/05/22 Functional score 47.    41 at eval; predicted score at visit 12 = 47.  ? ?LE MMT: ?  ?MMT Right ?01/19/2022 Left ?01/19/2022 Right  ?03/05/22 Left  ?03/05/22  ?Hip flexion 13.6 13.9 21.1 26.5  ?Hip extension        ?Hip abduction 31.1 20.4 24.5 27.4  ?Hip adduction        ?Hip internal rotation        ?Hip external rotation        ?Knee flexion        ?Knee extension 21.5 17.4 27.8 26.9  ?Ankle dorsiflexion        ?Ankle plantarflexion        ?Ankle inversion        ?Ankle eversion        ? ? ?TODAY'S TREATMENT: ?4/28 ?Pt seen for aquatic therapy today.  Treatment took place in water 3.25-4 ft in depth at the [facility] pool. Temp of water was 92?.  Pt entered/exited the pool via stairs independently with bilat rail. ? ?Without support Forward/ backward, side stepping - multiple laps ?Pushing rainbow dumbbell under water x 10 ?SLS (with  rainbow dumbbells) x 15-20 sec each leg forward  (RLE challenging) ?Holding blue hand buoys: high knee marching forward/ backward ?Holding onto wall:  Heel/toes raises x 20;  hip abdct x 10 each leg; hip ext x 10 each leg; hip flexion (with knee ext) x 10 x 2 each leg ?Repeated forward/ backward walking no UE support ?Horiz abdct/ add with bilat shoulders x 12 rainbow hand buoys ?Rainbow hand buoys at side - forward/backward walking  ?holding onto rainbow dumbbells:Tandem stance x  20 sec each leg forward, 2 reps; Heel / toe raises x 10  ?Sit to/from stand with blue step under feet x 10, arms reaching forward for improved weight shift - cues for nose over toes and hip hinge. SBA for safety initially ?Forward step ups x 5 each LE without support ?Forward/backward walking without UE support for recovery ? ? ?Pt requires buoyancy for support and to offload joints with strengthening exercises. Viscosity of the water is needed for resistance of strengthening; water current perturbations provides challenge to standing balance unsupported, requiring increased core activation. ? ?PATIENT EDUCATION:  ?Education details:  aquatics progressions, DOMS expectations ?Person educated: Patient ?Education method: Explanation, Demonstration, Tactile cues, Verbal cues, ?Education comprehension: verbalized understanding, returned demonstration, verbal cues required, tactile cues required, and needs further education ? ? ?HOME EXERCISE PROGRAM: ?Access Code: [email or web address] ?URL: [email or web address] ?Date: 01/18/2022 ?Prepared by: Carolyne Littles ? ? ? ?ASSESSMENT: ? ?CLINICAL IMPRESSION: ?Pt able to complete many exercises without UE support; she is fully confident in the aquatic environment now.   Her exercise tolerance is improving each visit.  She is challenged with Rt SLS/tandem stance. She was unsteady when first beginning STS exercise, but balance improved with repetition.   No increase in pain anywhere during session.   Encouraged her to look into a pool for her to return to water walking.  She is progressing gradually towards remaining goals and may benefit from continued PT intervention to maximize functional mobility and safety.

## 2022-03-18 ENCOUNTER — Ambulatory Visit (HOSPITAL_BASED_OUTPATIENT_CLINIC_OR_DEPARTMENT_OTHER): Payer: Medicare Other | Attending: Geriatric Medicine | Admitting: Physical Therapy

## 2022-03-18 ENCOUNTER — Encounter (HOSPITAL_BASED_OUTPATIENT_CLINIC_OR_DEPARTMENT_OTHER): Payer: Self-pay | Admitting: Physical Therapy

## 2022-03-18 DIAGNOSIS — R2689 Other abnormalities of gait and mobility: Secondary | ICD-10-CM | POA: Insufficient documentation

## 2022-03-18 DIAGNOSIS — R296 Repeated falls: Secondary | ICD-10-CM | POA: Insufficient documentation

## 2022-03-18 DIAGNOSIS — M6281 Muscle weakness (generalized): Secondary | ICD-10-CM | POA: Diagnosis not present

## 2022-03-18 NOTE — Therapy (Addendum)
OUTPATIENT PHYSICAL THERAPY LOWER EXTREMITY Treatment   PHYSICAL THERAPY DISCHARGE SUMMARY  Visits from Start of Care: 10  Current functional level related to goals / functional outcomes: Unknown, did not return for treatment   Remaining deficits: fatigue   Education / Equipment: Discussed eval findings, rehab rationale, aquatic program progression/POC and pools in area. Patient is in agreement    Patient agrees to discharge. Patient goals were partially met. Patient is being discharged due to not returning since the last visit.   Patient Name: [name] [name] MRN: [id number] DOB:1946/07/09, 76 y.o., female Today's Date: 03/18/2022   PT End of Session - 03/18/22 1123     Visit Number 10    Number of Visits 19    Date for PT Re-Evaluation 04/18/22    Authorization Type MCR a and B progress note at 10 Kx at 15    Progress Note Due on Visit 17    PT Start Time 1123    PT Stop Time 1203    PT Time Calculation (min) 40 min    Activity Tolerance Patient tolerated treatment well    Behavior During Therapy [facility] for tasks assessed/performed                Past Medical History:  Diagnosis Date   Allergic rhinitis    Bipolar disorder (HCC)    Cholelithiasis    seen on CAT 01/04/09   Chronic renal disease, stage III (HCC)    3/12   Complication of anesthesia    versed and fentanyl did not work for colonoscopy   Diabetic peripheral neuropathy (HCC) 07/18/2020   DM (diabetes mellitus) (HCC)    Edema    Gait abnormality 08/26/2019   HLD (hyperlipidemia)    HTN (hypertension)    Iron deficiency anemia    3/12    Lymphedema    tarda   Neuropathy    Obstructive sleep apnea on CPAP    Osteoarthritis    Renal cyst    2.7 cm seen on CAT 01/04/09   Tardive dyskinesia    Past Surgical History:  Procedure Laterality Date   ABDOMINAL HYSTERECTOMY     BALLOON DILATION N/A 04/24/2020   Procedure: BALLOON DILATION;  Surgeon: Kerin Salen, MD;  Location: [facility];  Service:  Gastroenterology;  Laterality: N/A;   BIOPSY  04/24/2020   Procedure: BIOPSY;  Surgeon: Kerin Salen, MD;  Location: [facility];  Service: Gastroenterology;;   BOTOX INJECTION N/A 04/24/2020   Procedure: POSSIBLE BOTOX INJECTION;  Surgeon: Kerin Salen, MD;  Location: [facility];  Service: Gastroenterology;  Laterality: N/A;   COLONOSCOPY  2009; 07/09/11   2009:normal (polyp was not adenoma); 2012:    DILATION AND CURETTAGE OF UTERUS     ESOPHAGEAL MANOMETRY N/A 05/31/2020   Procedure: ESOPHAGEAL MANOMETRY (EM);  Surgeon: Kerin Salen, MD;  Location: [facility];  Service: Gastroenterology;  Laterality: N/A;   ESOPHAGOGASTRODUODENOSCOPY  07/09/11   Erosive gastritis   ESOPHAGOGASTRODUODENOSCOPY (EGD) WITH PROPOFOL N/A 04/24/2020   Procedure: ESOPHAGOGASTRODUODENOSCOPY (EGD) WITH PROPOFOL;  Surgeon: Kerin Salen, MD;  Location: [facility];  Service: Gastroenterology;  Laterality: N/A;   FRACTURE SURGERY     rt fx upper arm   GIVENS CAPSULE STUDY  07/25/2011   normal small bowel   REIMPLANT URETER IN BLADDER  1996   TRIGGER FINGER RELEASE Left 02/04/2013   Procedure: RELEASE TRIGGER FINGER/A-1 PULLEY LEFT RING FINGER;  Surgeon: Tami Ribas, MD;  Location: [facility];  Service: Orthopedics;  Laterality: Left;  Patient Active Problem List   Diagnosis Date Noted   NSVT (nonsustained ventricular tachycardia) (HCC) 02/24/2021   Bifascicular block 02/24/2021   Fall at home 10/31/2020   Tardive dyskinesia 07/18/2020   Diabetic peripheral neuropathy (HCC) 07/18/2020   Gait abnormality 08/26/2019   Unspecified gastritis and gastroduodenitis without mention of hemorrhage 07/09/2011   Personal history of failed moderate sedation 05/20/2011   ACUTE CYSTITIS 02/23/2008   HYPERLIPIDEMIA 10/30/2007   DIABETES MELLITUS, TYPE II 07/08/2007   Iron deficiency anemia, unspecified 07/08/2007   DEPRESSION 07/08/2007   HYPERTENSION 07/08/2007    PCP: Merlene Laughter, MD  REFERRING PROVIDER:  Merlene Laughter, MD  REFERRING DIAG: R26.9 (ICD-10-CM) - Unspecified abnormalities of gait and mobility  THERAPY DIAG:  Other abnormalities of gait and mobility  Repeated falls  Muscle weakness (generalized)  ONSET DATE: declining mobility over the past 2 years   SUBJECTIVE:   SUBJECTIVE STATEMENT:  Tired after last visit, took nap, no pain    PERTINENT HISTORY: Tardive dyskenisia, Bilateral Knee replacement ( APROX 6 YEARS AGO); lymphedema, OA (multi joint but doesn't have pain with it) lymphedema   PAIN:  Are you having pain? No NPRS scale: 0/10 Pain location: Pain orientation:  PAIN TYPE: aching Pain description: intermittent  Aggravating factors: walking  Relieving factors: rest   Can have pain in both shoulders at times;   PRECAUTIONS: Fall; has had major syncope in the past but not in the past year. She has a loop recorder at this time (phone needs to be 3-5 ft from her to record)   WEIGHT BEARING RESTRICTIONS No  FALLS:  Has patient fallen in last 6 months? Yes, Number of falls: 3   LIVING ENVIRONMENT: 3 steps into the house   OCCUPATION: Retired   Presenter, broadcasting:  Water walking; Art; reading    PLOF: Independent with household mobility with device uses Rolator   PATIENT GOALS  Improve strength, ability to walk, stamina and balance    OBJECTIVE:   FOTO: 03/05/22 Functional score 47.    41 at eval; predicted score at visit 12 = 47.   LE MMT:   MMT Right 01/19/2022 Left 01/19/2022 Right  03/05/22 Left  03/05/22  Hip flexion 13.6 13.9 21.1 26.5  Hip extension        Hip abduction 31.1 20.4 24.5 27.4  Hip adduction        Hip internal rotation        Hip external rotation        Knee flexion        Knee extension 21.5 17.4 27.8 26.9  Ankle dorsiflexion        Ankle plantarflexion        Ankle inversion        Ankle eversion          TODAY'S TREATMENT: 4/28 Pt seen for aquatic therapy today.  Treatment took place in water 3.25-4 ft in depth at  the [facility] pool. Temp of water was 92.  Pt entered/exited the pool via stairs independently with bilat rail.  Without support Forward/ backward, side stepping - multiple laps Pushing rainbow dumbbell under water x 8 forward and back SLS (with rainbow dumbbells) x 15-20 sec each leg forward  Holding onto wall:  Heel/toes raises x 20;  hip abdct x 10 each leg; hip ext x 10 each leg; hip flexion x 10; squats x10          Horiz abdct/ add with bilat shoulders x 12 rainbow hand  buoys Rainbow hand buoys at side - forward/backward walking  holding onto rainbow dumbbells:Tandem stance x 20 sec each leg forward, 2 reps; Heel / toe raises x 10  Sit to/from stand with blue step under feet 2x5. VC for weight shift and immediate standing balance. Forward then side step ups x 10 each LE. UE on hand rails. VC for weight shift.  Forward/backward walking without UE support for recovery   Pt requires buoyancy for support and to offload joints with strengthening exercises. Viscosity of the water is needed for resistance of strengthening; water current perturbations provides challenge to standing balance unsupported, requiring increased core activation.  PATIENT EDUCATION:  Education details:  aquatics progressions, DOMS expectations Person educated: Patient Education method: Explanation, Demonstration, Actor cues, Verbal cues, Education comprehension: verbalized understanding, returned demonstration, verbal cues required, tactile cues required, and needs further education   HOME EXERCISE PROGRAM: Access Code: [email or web address] URL: [email or web address] Date: 01/18/2022 Prepared by: Lorayne Bender    ASSESSMENT:  CLINICAL IMPRESSION: Cueing for improved weight shift forward with STS and step up exercises. Pt tending toward losing balance bwd. Added side step ups for Le strengthening and balance challenge. She does require unilateral ue support of handrail with cues focusing on  using for balance and not assisting by pulling up onto step. She tolerates session well with some evident perspiration throughout which she states is normal for her and her not over heating.  She does report tiredness upon completion.  Goals ongoing    OBJECTIVE IMPAIRMENTS Abnormal gait, decreased activity tolerance, decreased balance, decreased coordination, decreased endurance, decreased mobility, difficulty walking, decreased ROM, decreased strength, impaired UE functional use, improper body mechanics, and pain.   ACTIVITY LIMITATIONS cleaning, community activity, occupation, and yard work.   PERSONAL FACTORS Tardive dyskenisia, Bilateral Knee replacement ( APROX 6 YEARS AGO); lymphedema, OA (multi joint but doesn't have pain with it) lymphedema  are also affecting patient's functional outcome.    REHAB POTENTIAL: Good  CLINICAL DECISION MAKING: Evolving/moderate complexity  EVALUATION COMPLEXITY: Moderate   GOALS: Goals reviewed with patient? No  SHORT TERM GOALS:  Patient will increase gross bilateral LE strength by 5 lbs  Baseline:  Target date: 02/16/22 Goal status: Partially met  2.  Patient will improve BERG score by 5 points  Baseline: ( still needs to be tested)  Target date: 02/16/22  Goal status: Not tested yet   3.  Patient will be independent with base exercise program  Baseline:  Target date: 02/16/22 Goal status: Ongoing  LONG TERM GOALS:  Patient will be independent with complete gym and pool program to promote balance and strength  Baseline:  Target date:  Goal status: Ongoing   2.  Patient will ambulate 1 mile without improved endurance and balance in order to perform IADL's such as shopping  Baseline:  Target date:  Goal status:Ongoing  3.  Patient will stand for 30 minutes with good balance and endurance  Baseline:  Target date:  Goal status: Ongoing   PLAN: PT FREQUENCY: 2x/week  PT DURATION: 8 weeks  PLANNED INTERVENTIONS:  Therapeutic exercises, Therapeutic activity, Neuromuscular re-education, Balance training, Gait training, Patient/Family education, Joint mobilization, Stair training, DME instructions, Aquatic Therapy, Electrical stimulation, Cryotherapy, Moist heat, and Manual therapy  PLAN FOR NEXT SESSION: BERG and 6 min walk on land. Begin general strength training and endurance training on land. In the water work on balance, core and LE strengthening.   Oron Westrup Tomma Lightning) Alivea Gladson MPT 03/18/22 1:28 PM  Addend Corrie Dandy Tomma Lightning) Kalynn Declercq MPT 04/23/23  343p

## 2022-03-20 ENCOUNTER — Encounter (HOSPITAL_BASED_OUTPATIENT_CLINIC_OR_DEPARTMENT_OTHER): Payer: Self-pay

## 2022-03-20 ENCOUNTER — Ambulatory Visit (HOSPITAL_BASED_OUTPATIENT_CLINIC_OR_DEPARTMENT_OTHER): Payer: Medicare Other | Admitting: Physical Therapy

## 2022-03-21 NOTE — Progress Notes (Signed)
[facility] Summary Report / Loop Recorder 

## 2022-03-27 ENCOUNTER — Ambulatory Visit (HOSPITAL_BASED_OUTPATIENT_CLINIC_OR_DEPARTMENT_OTHER): Payer: Self-pay | Admitting: Physical Therapy

## 2022-03-28 ENCOUNTER — Encounter (HOSPITAL_BASED_OUTPATIENT_CLINIC_OR_DEPARTMENT_OTHER): Payer: Self-pay

## 2022-03-28 ENCOUNTER — Ambulatory Visit (HOSPITAL_BASED_OUTPATIENT_CLINIC_OR_DEPARTMENT_OTHER): Payer: Medicare Other | Admitting: Physical Therapy

## 2022-04-04 DIAGNOSIS — F3181 Bipolar II disorder: Secondary | ICD-10-CM | POA: Diagnosis not present

## 2022-04-05 LAB — CUP PACEART REMOTE DEVICE CHECK
Date Time Interrogation Session: 20230517230914
Implantable Pulse Generator Implant Date: 20220411

## 2022-04-08 ENCOUNTER — Ambulatory Visit (INDEPENDENT_AMBULATORY_CARE_PROVIDER_SITE_OTHER): Payer: Medicare Other

## 2022-04-08 DIAGNOSIS — I4729 Other ventricular tachycardia: Secondary | ICD-10-CM

## 2022-04-12 ENCOUNTER — Telehealth: Payer: Self-pay | Admitting: Internal Medicine

## 2022-04-12 NOTE — Telephone Encounter (Signed)
Hi Dr. Carlean Purl,  This patient, a 76 year old female, is on recall for a colonoscopy with you.  However, since last being seen with you, she went to [facility] specifically for an esophageal issue she was having.  She does not feel like she needs to go back there and has not been since 2021.  She would like to come back to see you for her current issues which are frequent diarrhea as well as constipation and discuss the possibility of a colonoscopy.  Her records are in Epic for your review.  Please let me know how to proceed.    Thank you.

## 2022-04-16 NOTE — Telephone Encounter (Signed)
She saw [facility] and then went to [facility] and by my review was seen for colon and esophageal issues.  I will not accept her back.

## 2022-04-18 ENCOUNTER — Ambulatory Visit (HOSPITAL_BASED_OUTPATIENT_CLINIC_OR_DEPARTMENT_OTHER): Payer: Medicare Other | Admitting: Physical Therapy

## 2022-04-18 DIAGNOSIS — R195 Other fecal abnormalities: Secondary | ICD-10-CM | POA: Diagnosis not present

## 2022-04-18 DIAGNOSIS — R152 Fecal urgency: Secondary | ICD-10-CM | POA: Diagnosis not present

## 2022-04-18 DIAGNOSIS — Z1211 Encounter for screening for malignant neoplasm of colon: Secondary | ICD-10-CM | POA: Diagnosis not present

## 2022-04-19 ENCOUNTER — Other Ambulatory Visit: Payer: Self-pay | Admitting: Gastroenterology

## 2022-04-19 ENCOUNTER — Ambulatory Visit
Admission: RE | Admit: 2022-04-19 | Discharge: 2022-04-19 | Disposition: A | Discharge status: Home / Self Care | Payer: Medicare Other | Source: Ambulatory Visit | Attending: Gastroenterology | Admitting: Gastroenterology

## 2022-04-19 DIAGNOSIS — R195 Other fecal abnormalities: Secondary | ICD-10-CM

## 2022-04-19 DIAGNOSIS — R197 Diarrhea, unspecified: Secondary | ICD-10-CM | POA: Diagnosis not present

## 2022-04-19 DIAGNOSIS — R152 Fecal urgency: Secondary | ICD-10-CM

## 2022-04-19 DIAGNOSIS — K8020 Calculus of gallbladder without cholecystitis without obstruction: Secondary | ICD-10-CM | POA: Diagnosis not present

## 2022-04-19 IMAGING — CR DG ABDOMEN 1V
1 series · 1 of 1 positions shown · non-contrast
Comparison: None Available.

CLINICAL DATA: Diarrhea for several months.

EXAM:
ABDOMEN - 1 VIEW

[t abdomen supine]
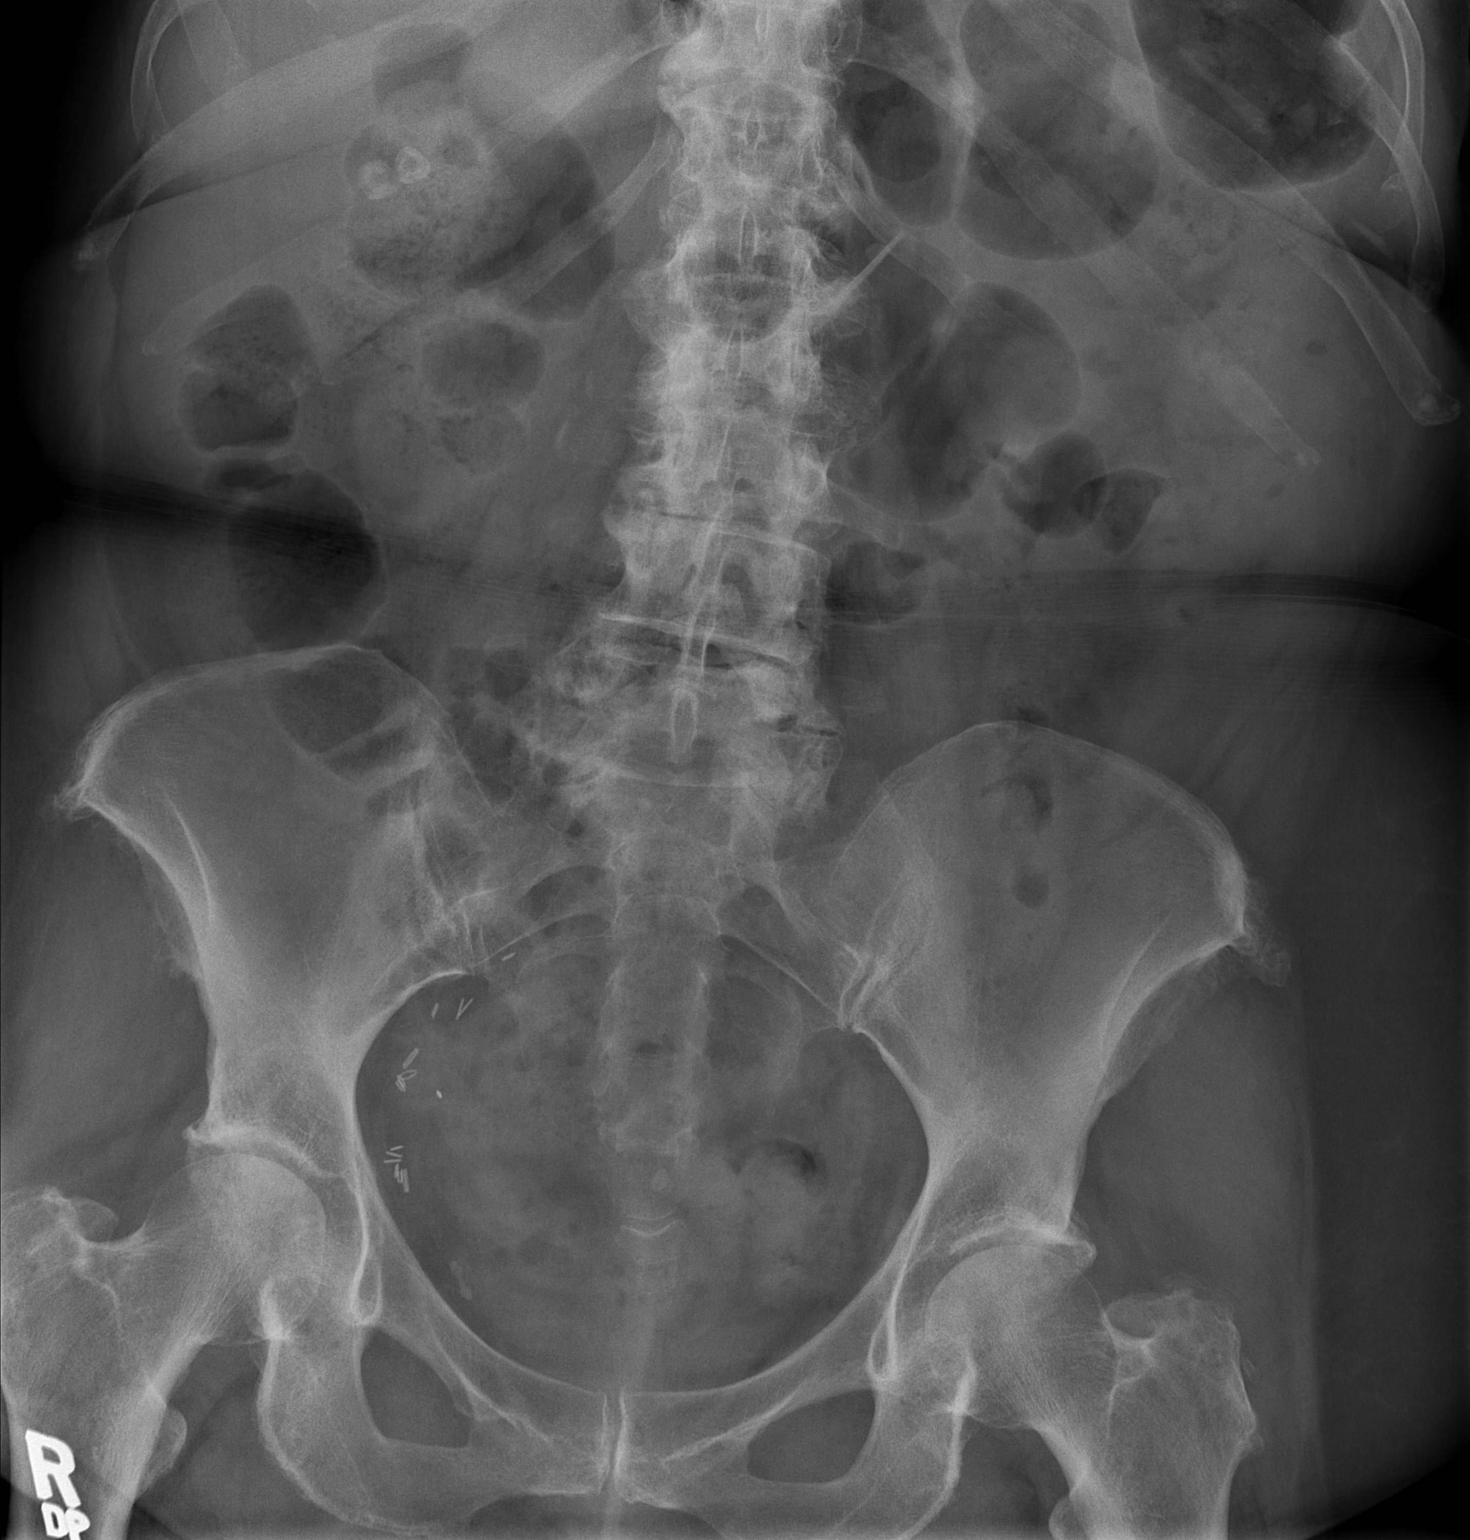

[1 of 1 positions shown; findings below may reference images not displayed]

FINDINGS: The bowel gas pattern is normal. A mild amount of stool is seen
within the colon. No radio-opaque calculi are identified. 8 mm and 9
mm gallstones are seen overlying the right upper quadrant. Multiple
small radiopaque surgical clips are seen within the pelvis on the
right.
IMPRESSION: 1. Mild stool burden without evidence of bowel obstruction.
2. Cholelithiasis.

## 2022-04-23 DIAGNOSIS — R195 Other fecal abnormalities: Secondary | ICD-10-CM | POA: Diagnosis not present

## 2022-04-23 DIAGNOSIS — R152 Fecal urgency: Secondary | ICD-10-CM | POA: Diagnosis not present

## 2022-04-24 NOTE — Progress Notes (Signed)
[facility] Summary Report / Loop Recorder 

## 2022-04-26 DIAGNOSIS — Z1211 Encounter for screening for malignant neoplasm of colon: Secondary | ICD-10-CM | POA: Diagnosis not present

## 2022-04-26 DIAGNOSIS — R159 Full incontinence of feces: Secondary | ICD-10-CM | POA: Diagnosis not present

## 2022-04-26 DIAGNOSIS — R197 Diarrhea, unspecified: Secondary | ICD-10-CM | POA: Diagnosis not present

## 2022-04-26 DIAGNOSIS — D120 Benign neoplasm of cecum: Secondary | ICD-10-CM | POA: Diagnosis not present

## 2022-04-30 DIAGNOSIS — D120 Benign neoplasm of cecum: Secondary | ICD-10-CM | POA: Diagnosis not present

## 2022-05-02 DIAGNOSIS — F3181 Bipolar II disorder: Secondary | ICD-10-CM | POA: Diagnosis not present

## 2022-05-13 ENCOUNTER — Ambulatory Visit (INDEPENDENT_AMBULATORY_CARE_PROVIDER_SITE_OTHER): Payer: Medicare Other

## 2022-05-13 DIAGNOSIS — R55 Syncope and collapse: Secondary | ICD-10-CM | POA: Diagnosis not present

## 2022-05-14 LAB — CUP PACEART REMOTE DEVICE CHECK
Date Time Interrogation Session: 20230619231147
Implantable Pulse Generator Implant Date: 20220411

## 2022-05-15 DIAGNOSIS — F3181 Bipolar II disorder: Secondary | ICD-10-CM | POA: Diagnosis not present

## 2022-05-23 ENCOUNTER — Encounter: Payer: Self-pay | Admitting: Adult Health

## 2022-05-23 ENCOUNTER — Ambulatory Visit (INDEPENDENT_AMBULATORY_CARE_PROVIDER_SITE_OTHER): Payer: Medicare Other | Admitting: Adult Health

## 2022-05-23 VITALS — BP 146/87 | HR 72 | Ht 63.0 in | Wt 214.0 lb

## 2022-05-23 DIAGNOSIS — F411 Generalized anxiety disorder: Secondary | ICD-10-CM | POA: Diagnosis not present

## 2022-05-23 DIAGNOSIS — F3181 Bipolar II disorder: Secondary | ICD-10-CM

## 2022-05-23 DIAGNOSIS — G2401 Drug induced subacute dyskinesia: Secondary | ICD-10-CM | POA: Diagnosis not present

## 2022-05-23 MED ORDER — ALPRAZOLAM 1 MG PO TABS: 1.0000 mg | ORAL_TABLET | Freq: Two times a day (BID) | ORAL | 2 refills | Status: DC | PRN

## 2022-05-23 MED ORDER — VALBENAZINE TOSYLATE 40 MG PO CAPS: 40.0000 mg | ORAL_CAPSULE | Freq: Every day | ORAL | 5 refills | Status: DC

## 2022-05-23 MED ORDER — LAMOTRIGINE 200 MG PO TABS: 200.0000 mg | ORAL_TABLET | Freq: Two times a day (BID) | ORAL | 5 refills | Status: DC

## 2022-05-23 MED ORDER — BUPROPION HCL ER (XL) 150 MG PO TB24: 150.0000 mg | ORAL_TABLET | Freq: Every day | ORAL | 5 refills | Status: DC

## 2022-05-23 MED ORDER — SERTRALINE HCL 100 MG PO TABS: 100.0000 mg | ORAL_TABLET | Freq: Two times a day (BID) | ORAL | 5 refills | Status: DC

## 2022-05-23 NOTE — Progress Notes (Signed)
[facility] MD/PA/NP Initial Note  05/23/2022 11:06 AM [name] [name]  MRN:  [id number]  Chief Complaint:   HPI:  Patient seen today for initial psychiatric evaluation.   Referred by [employer] for medication evaluation.  Describes mood today as "not too good". Pleasant. Denies tearfulness. Mood symptoms - reports depression and irritability. Denies anxiety. Denies worry and rumination - used to do that. Reports being an over thinker. Mood is down - depressed. Stating "I'm feeling pretty low". Feels like medications are helpful, but not managing her mood symptoms. Has tried various medications over the years, but feels she needs a change. Is unable to take SGA's due to Tardive Dyskinesia - taking Ingrezza. Would like to discuss other treatment options. Lacking interest and motivation. Taking medications as prescribed.  Energy levels lower. Active, does not have a regular exercise routine.  Enjoys some usual interests and activities. Married. Lives with husband of 65 years. Has 2 grown children. Spending time with family and friends. Appetite adequate. Weight gain. Sleeps well most nights. Averages 12 to 15 hours a day. Focus and concentration stable. Completing tasks. Managing aspects of household. Retired - lower Education officer, museum.  Denies SI or HI. Reports a history of passive SI. Reports a history of AH or VH. Denies self harm. Denies substance use.  Previous medication trials: Abilify, Seroquel, Latuda, Geodon  Visit Diagnosis:    ICD-10-CM   1. Bipolar II disorder (HCC)  F31.81 sertraline (ZOLOFT) 100 MG tablet    lamoTRIgine (LAMICTAL) 200 MG tablet    buPROPion (WELLBUTRIN XL) 150 MG 24 hr tablet    2. Generalized anxiety disorder  F41.1 sertraline (ZOLOFT) 100 MG tablet    ALPRAZolam (XANAX) 1 MG tablet    3. Tardive dyskinesia  G24.01 valbenazine (INGREZZA) 40 MG capsule      Past Psychiatric History: Denies psychiatric hospitalization.   Past Medical History:  Past  Medical History:  Diagnosis Date   Allergic rhinitis    Bipolar disorder ([place])    Cholelithiasis    seen on CAT 01/04/09   Chronic renal disease, stage III ([place])    4/19   Complication of anesthesia    versed and fentanyl did not work for colonoscopy   Diabetic peripheral neuropathy ([place]) 07/18/2020   DM (diabetes mellitus) ([place])    Edema    Gait abnormality 08/26/2019   HLD (hyperlipidemia)    HTN (hypertension)    Iron deficiency anemia    3/12    Lymphedema    tarda   Neuropathy    Obstructive sleep apnea on CPAP    Osteoarthritis    Renal cyst    2.7 cm seen on CAT 01/04/09   Tardive dyskinesia     Past Surgical History:  Procedure Laterality Date   ABDOMINAL HYSTERECTOMY     BALLOON DILATION N/A 04/24/2020   Procedure: BALLOON DILATION;  Surgeon: Ronnette Juniper, MD;  Location: [facility];  Service: Gastroenterology;  Laterality: N/A;   BIOPSY  04/24/2020   Procedure: BIOPSY;  Surgeon: Ronnette Juniper, MD;  Location: [facility];  Service: Gastroenterology;;   BOTOX INJECTION N/A 04/24/2020   Procedure: POSSIBLE BOTOX INJECTION;  Surgeon: Ronnette Juniper, MD;  Location: [facility];  Service: Gastroenterology;  Laterality: N/A;   COLONOSCOPY  2009; 07/09/11   2009:normal (polyp was not adenoma); 2012:    DILATION AND CURETTAGE OF UTERUS     ESOPHAGEAL MANOMETRY N/A 05/31/2020   Procedure: ESOPHAGEAL MANOMETRY (EM);  Surgeon: Ronnette Juniper, MD;  Location: [facility];  Service:  Gastroenterology;  Laterality: N/A;   ESOPHAGOGASTRODUODENOSCOPY  07/09/11   Erosive gastritis   ESOPHAGOGASTRODUODENOSCOPY (EGD) WITH PROPOFOL N/A 04/24/2020   Procedure: ESOPHAGOGASTRODUODENOSCOPY (EGD) WITH PROPOFOL;  Surgeon: Ronnette Juniper, MD;  Location: [facility];  Service: Gastroenterology;  Laterality: N/A;   FRACTURE SURGERY     rt fx upper arm   GIVENS CAPSULE STUDY  07/25/2011   normal small bowel   REIMPLANT URETER IN BLADDER  1996   TRIGGER FINGER RELEASE Left 02/04/2013   Procedure: RELEASE TRIGGER  FINGER/A-1 PULLEY LEFT RING FINGER;  Surgeon: Tennis Must, MD;  Location: [facility];  Service: Orthopedics;  Laterality: Left;    Family Psychiatric History: Family history of mental illness.   Family History:  Family History  Problem Relation Age of Onset   Diabetes Mother    Hyperlipidemia Mother    Heart disease Father    Heart attack Father    Colon cancer Neg Hx     Social History:  Social History   Socioeconomic History   Marital status: Married    Spouse name: Not on file   Number of children: 2   Years of education: Not on file   Highest education level: Not on file  Occupational History   Occupation: Retired Pharmacist, hospital  Tobacco Use   Smoking status: Former    Types: Cigarettes    Quit date: 02/02/1991    Years since quitting: 31.3   Smokeless tobacco: Never  Substance and Sexual Activity   Alcohol use: Not Currently    Comment: less than 1 a day   Drug use: No   Sexual activity: Not on file  Other Topics Concern   Not on file  Social History Narrative   Right handed    Caffeine ~1-2 cups every other day    Lives at home with husband    Social Determinants of Health   Financial Resource Strain: Not on file  Food Insecurity: Not on file  Transportation Needs: Not on file  Physical Activity: Not on file  Stress: Not on file  Social Connections: Not on file    Allergies:  Allergies  Allergen Reactions   Latuda [Lurasidone] Other (See Comments)    Increased suicidal thoughts    Aripiprazole Other (See Comments)    Tardive Dyskinesia Other reaction(s): tardive dyskinesia   Bacitracin-Polymyxin B     Other reaction(s): rash   Losartan Potassium     Other reaction(s): decline gfr   Neo-Bacit-Poly-Lidocaine    Quetiapine Other (See Comments)    Tardive Dyskinesia Other reaction(s): tardive dyskinesia    Metabolic Disorder Labs: Lab Results  Component Value Date   HGBA1C 4.9 10/31/2020   MPG 93.93 10/31/2020   No results found  for: "PROLACTIN" Lab Results  Component Value Date   CHOL 190 11/01/2020   TRIG 104 11/01/2020   HDL 86 11/01/2020   CHOLHDL 2.2 11/01/2020   VLDL 21 11/01/2020   LDLCALC 83 11/01/2020   LDLCALC 112 (H) 10/30/2007   Lab Results  Component Value Date   TSH 2.298 10/31/2020   TSH 0.729 10/30/2007    Therapeutic Level Labs: No results found for: "LITHIUM" No results found for: "VALPROATE" No results found for: "CBMZ"  Current Medications: Current Outpatient Medications  Medication Sig Dispense Refill   buPROPion (WELLBUTRIN XL) 150 MG 24 hr tablet Take 1 tablet (150 mg total) by mouth daily after breakfast. 30 tablet 5   ALPRAZolam (XANAX) 1 MG tablet Take 1 tablet (1 mg total) by  mouth 2 (two) times daily as needed for anxiety or sleep. 60 tablet 2   amLODipine (NORVASC) 5 MG tablet Take 5 mg by mouth at bedtime.      lamoTRIgine (LAMICTAL) 200 MG tablet Take 1 tablet (200 mg total) by mouth 2 (two) times daily. 60 tablet 5   Multiple Vitamins-Minerals (MULTIVITAMIN WITH MINERALS) tablet Take 1 tablet by mouth daily.     sertraline (ZOLOFT) 100 MG tablet Take 1 tablet (100 mg total) by mouth 2 (two) times daily. 60 tablet 5   simvastatin (ZOCOR) 40 MG tablet Take 40 mg by mouth at bedtime.      valbenazine (INGREZZA) 40 MG capsule Take 1 capsule (40 mg total) by mouth at bedtime. 30 capsule 5   No current facility-administered medications for this visit.    Medication Side Effects: none  Orders placed this visit:  No orders of the defined types were placed in this encounter.   Psychiatric Specialty Exam:  Review of Systems  Musculoskeletal:  Negative for gait problem.  Neurological:  Negative for tremors.  Psychiatric/Behavioral:         Please refer to HPI    Blood pressure (!) 146/87, pulse 72, height '5\' 3"'$  (1.6 m), weight 214 lb (97.1 kg).Body mass index is 37.91 kg/m.  General Appearance: Casual and Neat  Eye Contact:  Good  Speech:  Clear and Coherent and  Normal Rate  Volume:  Normal  Mood:  Depressed  Affect:  Appropriate and Congruent  Thought Process:  Coherent and Descriptions of Associations: Intact  Orientation:  Full (Time, Place, and Person)  Thought Content: Logical   Suicidal Thoughts:  No  Homicidal Thoughts:  No  Memory:  WNL  Judgement:  Good  Insight:  Good  Psychomotor Activity:  Normal  Concentration:  Concentration: Good  Recall:  Good  Fund of Knowledge: Good  Language: Good  Assets:  Communication Skills Desire for Improvement Financial Resources/Insurance Housing Intimacy Leisure Time Physical Health Resilience Social Support Talents/Skills Transportation Vocational/Educational  ADL's:  Intact  Cognition: WNL  Prognosis:  Good   Screenings: MDQ  Receiving Psychotherapy: Yes Ecologist  Treatment Plan/Recommendations:  Plan:  PDMP reviewed  Ingrezza '40mg'$  daily Zoloft '100mg'$  - 2 daily Lamictal '200mg'$  BID Xanax '1mg'$  BID Add Wellbutrin XL '150mg'$  every morning  Patient seen for 30 minutes and time spent discussing treatment options.  RTC 4 weeks  Patient advised to contact office with any questions, adverse effects, or acute worsening in signs and symptoms.  Discussed potential benefits, risk, and side effects of benzodiazepines to include potential risk of tolerance and dependence, as well as possible drowsiness.  Advised patient not to drive if experiencing drowsiness and to take lowest possible effective dose to minimize risk of dependence and tolerance.   Aloha Gell, NP

## 2022-05-30 DIAGNOSIS — F3181 Bipolar II disorder: Secondary | ICD-10-CM | POA: Diagnosis not present

## 2022-06-07 NOTE — Progress Notes (Signed)
[facility] Summary Report / Loop Recorder 

## 2022-06-13 LAB — CUP PACEART REMOTE DEVICE CHECK
Date Time Interrogation Session: 20230722230447
Implantable Pulse Generator Implant Date: 20220411

## 2022-06-17 ENCOUNTER — Ambulatory Visit (INDEPENDENT_AMBULATORY_CARE_PROVIDER_SITE_OTHER): Payer: Medicare Other

## 2022-06-17 DIAGNOSIS — R55 Syncope and collapse: Secondary | ICD-10-CM | POA: Diagnosis not present

## 2022-06-20 ENCOUNTER — Encounter: Payer: Self-pay | Admitting: Adult Health

## 2022-06-20 ENCOUNTER — Ambulatory Visit (INDEPENDENT_AMBULATORY_CARE_PROVIDER_SITE_OTHER): Payer: Medicare Other | Admitting: Adult Health

## 2022-06-20 DIAGNOSIS — G2401 Drug induced subacute dyskinesia: Secondary | ICD-10-CM | POA: Diagnosis not present

## 2022-06-20 DIAGNOSIS — F411 Generalized anxiety disorder: Secondary | ICD-10-CM

## 2022-06-20 DIAGNOSIS — F3181 Bipolar II disorder: Secondary | ICD-10-CM | POA: Diagnosis not present

## 2022-06-20 MED ORDER — BUPROPION HCL ER (XL) 150 MG PO TB24: ORAL_TABLET | ORAL | 5 refills | Status: DC

## 2022-06-20 NOTE — Progress Notes (Addendum)
[name] [name] [id number] 27-Nov-1945 76 y.o.  Subjective:   Patient ID:  [name] [name] is a 76 y.o. (DOB 1946-06-03) female.  Chief Complaint: No chief complaint on file.   HPI [name] [name] presents to the office today for follow-up of BPD-2, MDD, GAD.  Referred by [employer] for medication evaluation.  Describes mood today as "improved". Pleasant. Denies tearfulness. Mood symptoms - reports decreased depression and irritability. Denies anxiety. Denies worry and rumination. Decreased over thinking. Mood has improved. depressed. Stating "I'm feeling better". Feels like the addition of Wellbutrin has been helpful, but would like to increase the dose. Improved interest and motivation. Taking medications as prescribed.  Energy levels improved. Active, does not have a regular exercise routine.  Enjoys some usual interests and activities. Married. Lives with husband of 49 years. Has 2 grown children. Spending time with family and friends. Appetite adequate. Weight stable Sleeps well most nights. Averages 12 to 15 hours a day - recovering from a cold. Focus and concentration stable. Completing tasks. Managing aspects of household. Retired - lower Education officer, museum.  Denies SI or HI.  Reports a history of AH or VH. Denies self harm. Denies substance use.  Previous medication trials: Abilify, Seroquel, Latuda, Geodon     Review of Systems:  Review of Systems  Musculoskeletal:  Negative for gait problem.  Neurological:  Negative for tremors.  Psychiatric/Behavioral:         Please refer to HPI    Medications: I have reviewed the patient's current medications.  Current Outpatient Medications  Medication Sig Dispense Refill   ALPRAZolam (XANAX) 1 MG tablet Take 1 tablet (1 mg total) by mouth 2 (two) times daily as needed for anxiety or sleep. 60 tablet 2   amLODipine (NORVASC) 5 MG tablet Take 5 mg by mouth at bedtime.      buPROPion (WELLBUTRIN XL) 150 MG 24 hr tablet Take one  tablet in the morning and one tablet at lunch. 60 tablet 5   lamoTRIgine (LAMICTAL) 200 MG tablet Take 1 tablet (200 mg total) by mouth 2 (two) times daily. 60 tablet 5   Multiple Vitamins-Minerals (MULTIVITAMIN WITH MINERALS) tablet Take 1 tablet by mouth daily.     sertraline (ZOLOFT) 100 MG tablet Take 1 tablet (100 mg total) by mouth 2 (two) times daily. 60 tablet 5   simvastatin (ZOCOR) 40 MG tablet Take 40 mg by mouth at bedtime.      valbenazine (INGREZZA) 40 MG capsule Take 1 capsule (40 mg total) by mouth at bedtime. 30 capsule 5   No current facility-administered medications for this visit.    Medication Side Effects: None  Allergies:  Allergies  Allergen Reactions   Latuda [Lurasidone] Other (See Comments)    Increased suicidal thoughts    Aripiprazole Other (See Comments)    Tardive Dyskinesia Other reaction(s): tardive dyskinesia   Bacitracin-Polymyxin B     Other reaction(s): rash   Losartan Potassium     Other reaction(s): decline gfr   Neo-Bacit-Poly-Lidocaine    Quetiapine Other (See Comments)    Tardive Dyskinesia Other reaction(s): tardive dyskinesia    Past Medical History:  Diagnosis Date   Allergic rhinitis    Bipolar disorder ([place])    Cholelithiasis    seen on CAT 01/04/09   Chronic renal disease, stage III ([place])    1/51   Complication of anesthesia    versed and fentanyl did not work for colonoscopy   Diabetic peripheral neuropathy ([place]) 07/18/2020   DM (  diabetes mellitus) ([place])    Edema    Gait abnormality 08/26/2019   HLD (hyperlipidemia)    HTN (hypertension)    Iron deficiency anemia    3/12    Lymphedema    tarda   Neuropathy    Obstructive sleep apnea on CPAP    Osteoarthritis    Renal cyst    2.7 cm seen on CAT 01/04/09   Tardive dyskinesia     Past Medical History, Surgical history, Social history, and Family history were reviewed and updated as appropriate.   Please see review of systems for further details on the patient's  review from today.   Objective:   Physical Exam:  There were no vitals taken for this visit.  Physical Exam Constitutional:      General: She is not in acute distress. Musculoskeletal:        General: No deformity.  Neurological:     Mental Status: She is alert and oriented to person, place, and time.     Coordination: Coordination normal.  Psychiatric:        Attention and Perception: Attention and perception normal. She does not perceive auditory or visual hallucinations.        Mood and Affect: Mood normal. Mood is not anxious or depressed. Affect is not labile, blunt, angry or inappropriate.        Speech: Speech normal.        Behavior: Behavior normal.        Thought Content: Thought content normal. Thought content is not paranoid or delusional. Thought content does not include homicidal or suicidal ideation. Thought content does not include homicidal or suicidal plan.        Cognition and Memory: Cognition and memory normal.        Judgment: Judgment normal.     Comments: Insight intact     Lab Review:     Component Value Date/Time   NA 139 11/01/2020 0345   K 3.6 11/01/2020 0345   CL 106 11/01/2020 0345   CO2 24 11/01/2020 0345   GLUCOSE 92 11/01/2020 0345   BUN 23 11/01/2020 0345   CREATININE 1.18 (H) 11/01/2020 0345   CALCIUM 8.9 11/01/2020 0345   PROT 7.0 01/05/2020 1035   ALBUMIN 4.6 10/30/2007 2244   AST 13 10/30/2007 2244   ALT 11 10/30/2007 2244   ALKPHOS 49 10/30/2007 2244   BILITOT 0.6 10/30/2007 2244   GFRNONAA 48 (L) 11/01/2020 0345   GFRAA 47 (L) 12/06/2019 2242       Component Value Date/Time   WBC 5.4 11/01/2020 0345   RBC 3.33 (L) 11/01/2020 0345   HGB 11.7 (L) 11/01/2020 0345   HCT 33.1 (L) 11/01/2020 0345   PLT 153 11/01/2020 0345   MCV 99.4 11/01/2020 0345   MCH 35.1 (H) 11/01/2020 0345   MCHC 35.3 11/01/2020 0345   RDW 12.4 11/01/2020 0345   LYMPHSABS 0.7 10/31/2020 1107   MONOABS 0.5 10/31/2020 1107   EOSABS 0.2 10/31/2020 1107    BASOSABS 0.0 10/31/2020 1107    No results found for: "POCLITH", "LITHIUM"   No results found for: "PHENYTOIN", "PHENOBARB", "VALPROATE", "CBMZ"   .res Assessment: Plan:    Plan:  PDMP reviewed  Ingrezza '40mg'$  daily Zoloft '100mg'$  - 2 daily Lamictal '200mg'$  BID Xanax '1mg'$  BID Increase Wellbutrin XL '150mg'$  every morning to morning and lunch  Patient seen for 25 minutes and time spent discussing treatment options.  RTC 4 weeks  Patient advised to contact office with any  questions, adverse effects, or acute worsening in signs and symptoms.  Discussed potential benefits, risk, and side effects of benzodiazepines to include potential risk of tolerance and dependence, as well as possible drowsiness.  Advised patient not to drive if experiencing drowsiness and to take lowest possible effective dose to minimize risk of dependence and tolerance.   Diagnoses and all orders for this visit:  Bipolar II disorder ([place]) -     buPROPion (WELLBUTRIN XL) 150 MG 24 hr tablet; Take one tablet in the morning and one tablet at lunch.  Generalized anxiety disorder  Tardive dyskinesia     Please see After Visit Summary for patient specific instructions.  Future Appointments  Date Time Provider [place]  07/18/2022 11:20 AM Aishia Barkey, Berdie Ogren, NP [facility] None  08/26/2022  8:25 AM [facility] DEVICE REMOTES [facility]  09/30/2022  8:25 AM [facility] DEVICE REMOTES [facility]  11/04/2022  8:25 AM [facility] DEVICE REMOTES [facility]    No orders of the defined types were placed in this encounter.   -------------------------------

## 2022-06-21 ENCOUNTER — Encounter (HOSPITAL_BASED_OUTPATIENT_CLINIC_OR_DEPARTMENT_OTHER): Payer: Self-pay | Admitting: Emergency Medicine

## 2022-06-21 ENCOUNTER — Other Ambulatory Visit: Payer: Self-pay

## 2022-06-21 ENCOUNTER — Emergency Department (HOSPITAL_BASED_OUTPATIENT_CLINIC_OR_DEPARTMENT_OTHER): Payer: Medicare Other | Admitting: Radiology

## 2022-06-21 DIAGNOSIS — Z79899 Other long term (current) drug therapy: Secondary | ICD-10-CM | POA: Diagnosis not present

## 2022-06-21 DIAGNOSIS — M25562 Pain in left knee: Secondary | ICD-10-CM | POA: Diagnosis not present

## 2022-06-21 DIAGNOSIS — Y9301 Activity, walking, marching and hiking: Secondary | ICD-10-CM | POA: Insufficient documentation

## 2022-06-21 DIAGNOSIS — S8002XA Contusion of left knee, initial encounter: Secondary | ICD-10-CM | POA: Diagnosis not present

## 2022-06-21 DIAGNOSIS — Z96652 Presence of left artificial knee joint: Secondary | ICD-10-CM | POA: Insufficient documentation

## 2022-06-21 DIAGNOSIS — I10 Essential (primary) hypertension: Secondary | ICD-10-CM | POA: Diagnosis not present

## 2022-06-21 DIAGNOSIS — W010XXA Fall on same level from slipping, tripping and stumbling without subsequent striking against object, initial encounter: Secondary | ICD-10-CM | POA: Insufficient documentation

## 2022-06-21 DIAGNOSIS — W19XXXA Unspecified fall, initial encounter: Secondary | ICD-10-CM | POA: Diagnosis not present

## 2022-06-21 DIAGNOSIS — S80919A Unspecified superficial injury of unspecified knee, initial encounter: Secondary | ICD-10-CM | POA: Diagnosis not present

## 2022-06-21 DIAGNOSIS — S8992XA Unspecified injury of left lower leg, initial encounter: Secondary | ICD-10-CM | POA: Diagnosis present

## 2022-06-21 NOTE — ED Triage Notes (Addendum)
Arrives via EMS for mechanical fall causing injury to L knee with swelling but no obvious deformity. No LOC, head injury, N/V, or any other pain. Not on blood thinners. Has had previous L knee replacement. EMS V/S: 136palp SBP, 18RR, 80bpm, 96% RA. Alert, able to sit in [facility] in lobby per EMS.

## 2022-06-22 ENCOUNTER — Emergency Department (HOSPITAL_BASED_OUTPATIENT_CLINIC_OR_DEPARTMENT_OTHER)
Admission: EM | Admit: 2022-06-22 | Discharge: 2022-06-22 | Disposition: A | Discharge status: Home / Self Care | Payer: Medicare Other | Attending: Emergency Medicine | Admitting: Emergency Medicine

## 2022-06-22 DIAGNOSIS — S8002XA Contusion of left knee, initial encounter: Secondary | ICD-10-CM

## 2022-06-22 NOTE — ED Notes (Signed)
Pt agreeable with d/c plan as discussed by provider - this nurse has verbally reinforced d/c instructions and provided pt with written copy- pt acknowledges verbal understanding and denies any additional questions, concerns, needs- escorted via w/c to vehicle; accompanied home by spouse

## 2022-06-22 NOTE — ED Provider Notes (Signed)
[facility] EMERGENCY DEPT Provider Note   CSN: [id number] Arrival date & time: 06/21/22  2206     History  Chief Complaint  Patient presents with   Knee Injury    [name] [name] is a 76 y.o. female.  Patient is a 76 year old female with past medical history of prior left total knee replacement, hypertension, hyperlipidemia.  Patient presenting today with complaints of a fall.  She reports walking up stairs when she tripped and her left knee struck the step.  She has pain, bruising, and swelling to the anterior left knee.  Pain is worse when she ambulates.  Pain relieved with rest and elevation.  The history is provided by the patient.       Home Medications Prior to Admission medications   Medication Sig Start Date End Date Taking? Authorizing Provider  ALPRAZolam Duanne Moron) 1 MG tablet Take 1 tablet (1 mg total) by mouth 2 (two) times daily as needed for anxiety or sleep. 05/23/22   Mozingo, Berdie Ogren, NP  amLODipine (NORVASC) 5 MG tablet Take 5 mg by mouth at bedtime.     [provider]  buPROPion (WELLBUTRIN XL) 150 MG 24 hr tablet Take one tablet in the morning and one tablet at lunch. 06/20/22   Mozingo, Berdie Ogren, NP  lamoTRIgine (LAMICTAL) 200 MG tablet Take 1 tablet (200 mg total) by mouth 2 (two) times daily. 05/23/22   Mozingo, Berdie Ogren, NP  Multiple Vitamins-Minerals (MULTIVITAMIN WITH MINERALS) tablet Take 1 tablet by mouth daily.    [provider]  sertraline (ZOLOFT) 100 MG tablet Take 1 tablet (100 mg total) by mouth 2 (two) times daily. 05/23/22   Mozingo, Berdie Ogren, NP  simvastatin (ZOCOR) 40 MG tablet Take 40 mg by mouth at bedtime.     [provider]  valbenazine (INGREZZA) 40 MG capsule Take 1 capsule (40 mg total) by mouth at bedtime. 05/23/22   Mozingo, Berdie Ogren, NP      Allergies    Latuda [lurasidone], Aripiprazole, Bacitracin-polymyxin b, Losartan potassium, Neo-bacit-poly-lidocaine, and  Quetiapine    Review of Systems   Review of Systems  All other systems reviewed and are negative.   Physical Exam Updated Vital Signs BP (!) 144/99 (BP Location: Right Arm)   Pulse 70   Temp 98.8 F (37.1 C) (Oral)   Resp 18   SpO2 99%  Physical Exam Vitals and nursing note reviewed.  Constitutional:      General: She is not in acute distress.    Appearance: Normal appearance. She is not ill-appearing.  HENT:     Head: Normocephalic and atraumatic.  Pulmonary:     Effort: Pulmonary effort is normal.  Musculoskeletal:     Comments: There is bruising/ecchymosis noted to the left knee just inferior and medial to the patella.  There is no palpable joint effusion.  She has good range of motion with no crepitus.  There is no varus or valgus laxity   Skin:    General: Skin is warm and dry.  Neurological:     Mental Status: She is alert.     ED Results / Procedures / Treatments   Labs (all labs ordered are listed, but only abnormal results are displayed) Labs Reviewed - No data to display  EKG None  Radiology DG Knee Complete 4 Views Left  Result Date: 06/22/2022 CLINICAL DATA:  Mechanical fall causing injury to left knee. EXAM: LEFT KNEE - COMPLETE 4+ VIEW COMPARISON:  None Available. FINDINGS: Total knee  arthroplasty changes are noted. No acute fracture or dislocation. No evidence of hardware loosening. A trace suprapatellar joint effusion is noted. The soft tissues are within normal limits. IMPRESSION: No evidence of acute fracture or hardware loosening. Electronically Signed   By: Brett Fairy M.D.   On: 06/22/2022 00:16    Procedures Procedures    Medications Ordered in ED Medications - No data to display  ED Course/ Medical Decision Making/ A&P  X-rays are negative.  This will be treated as a contusion with ice, rest, and follow-up as needed.  Final Clinical Impression(s) / ED Diagnoses Final diagnoses:  None    Rx / DC Orders ED Discharge Orders      None         Veryl Speak, MD 06/22/22 [phone]

## 2022-06-22 NOTE — ED Notes (Signed)
Late entry -- Nonpitting edema to LLE with ecchymosis to L knee regon; chronic weakness to LE otherwise LLE distal neurovascular status intact.   Compression bandage applied to L knee, well tolerated by patient and pt has been provided with re-usable ice packs

## 2022-06-22 NOTE — Discharge Instructions (Addendum)
Ice for 20 minutes every 2 hours while awake for the next 2 days.  Elevate your leg is much as possible.  Weightbearing as tolerated.  Follow-up with your orthopedist/primary doctor if not improving in the next 1 to 2 weeks.

## 2022-07-18 ENCOUNTER — Encounter: Payer: Self-pay | Admitting: Adult Health

## 2022-07-18 ENCOUNTER — Ambulatory Visit (INDEPENDENT_AMBULATORY_CARE_PROVIDER_SITE_OTHER): Payer: Medicare Other | Admitting: Adult Health

## 2022-07-18 ENCOUNTER — Ambulatory Visit (INDEPENDENT_AMBULATORY_CARE_PROVIDER_SITE_OTHER): Payer: Medicare Other

## 2022-07-18 DIAGNOSIS — G2401 Drug induced subacute dyskinesia: Secondary | ICD-10-CM | POA: Diagnosis not present

## 2022-07-18 DIAGNOSIS — F3181 Bipolar II disorder: Secondary | ICD-10-CM

## 2022-07-18 DIAGNOSIS — F411 Generalized anxiety disorder: Secondary | ICD-10-CM

## 2022-07-18 DIAGNOSIS — R55 Syncope and collapse: Secondary | ICD-10-CM

## 2022-07-18 LAB — CUP PACEART REMOTE DEVICE CHECK
Date Time Interrogation Session: 20230830230610
Implantable Pulse Generator Implant Date: 20220411

## 2022-07-18 NOTE — Progress Notes (Signed)
[name] [name] [id number] 09-May-1946 76 y.o.  Subjective:   Patient ID:  [name] [name] is a 76 y.o. (DOB [birth year]) female.  Chief Complaint: No chief complaint on file.   HPI [name] [name] presents to the office today for follow-up of BPD-2, GAD and TD.  Referred by [employer] for medication evaluation.  Describes mood today as "improved". Pleasant. Denies tearfulness. Mood symptoms - reports decreased depression - still sleeping more. Denies irritability and anxiety. Denies worry and rumination. Decreased over thinking. Mood has improved. Stating "I'm feeling better, but I feel restless". Feels like the increase in Wellbutrin has been helpful, but not sure if it's making her "restless". Willing to decrease the dose from '300mg'$  to '150mg'$  every morning. Varying interest and motivation. Taking medications as prescribed.  Energy levels lower. Active, does not have a regular exercise routine.  Enjoys some usual interests and activities. Married. Lives with husband of 23 years. Has 2 grown children. Spending time with family and friends. Appetite adequate. Weight stable. Sleep varies. Getting about 3 to 4 hours a night - and then sleeps most of the day. "I have my days and nights mixed up".  Focus and concentration stable. Completing tasks. Managing aspects of household. Retired - lower Education officer, [employer].  Denies SI or HI.  Reports a history of AH or VH. Denies self harm. Denies substance use.  Previous medication trials: Abilify, Seroquel, [facility] from 06/22/2022 in [facility]  C-SSRS RISK CATEGORY No Risk       Review of Systems:  Review of Systems  Musculoskeletal:  Negative for gait problem.  Neurological:  Negative for tremors.  Psychiatric/Behavioral:         Please refer to HPI    Medications: I have reviewed the patient's current medications.  Current Outpatient Medications  Medication Sig Dispense Refill    ALPRAZolam (XANAX) 1 MG tablet Take 1 tablet (1 mg total) by mouth 2 (two) times daily as needed for anxiety or sleep. 60 tablet 2   amLODipine (NORVASC) 5 MG tablet Take 5 mg by mouth at bedtime.      buPROPion (WELLBUTRIN XL) 150 MG 24 hr tablet Take one tablet in the morning and one tablet at lunch. 60 tablet 5   lamoTRIgine (LAMICTAL) 200 MG tablet Take 1 tablet (200 mg total) by mouth 2 (two) times daily. 60 tablet 5   Multiple Vitamins-Minerals (MULTIVITAMIN WITH MINERALS) tablet Take 1 tablet by mouth daily.     sertraline (ZOLOFT) 100 MG tablet Take 1 tablet (100 mg total) by mouth 2 (two) times daily. 60 tablet 5   simvastatin (ZOCOR) 40 MG tablet Take 40 mg by mouth at bedtime.      valbenazine (INGREZZA) 40 MG capsule Take 1 capsule (40 mg total) by mouth at bedtime. 30 capsule 5   No current facility-administered medications for this visit.    Medication Side Effects: None  Allergies:  Allergies  Allergen Reactions   Latuda [Lurasidone] Other (See Comments)    Increased suicidal thoughts    Aripiprazole Other (See Comments)    Tardive Dyskinesia Other reaction(s): tardive dyskinesia   Bacitracin-Polymyxin B     Other reaction(s): rash   Losartan Potassium     Other reaction(s): decline gfr   Neo-Bacit-Poly-Lidocaine    Quetiapine Other (See Comments)    Tardive Dyskinesia Other reaction(s): tardive dyskinesia    Past Medical History:  Diagnosis Date   Allergic rhinitis    Bipolar disorder (  [place])    Cholelithiasis    seen on CAT 01/04/09   Chronic renal disease, stage III ([place])    7/59   Complication of anesthesia    versed and fentanyl did not work for colonoscopy   Diabetic peripheral neuropathy ([place]) 07/18/2020   DM (diabetes mellitus) ([place])    Edema    Gait abnormality 08/26/2019   HLD (hyperlipidemia)    HTN (hypertension)    Iron deficiency anemia    3/12    Lymphedema    tarda   Neuropathy    Obstructive sleep apnea on CPAP    Osteoarthritis     Renal cyst    2.7 cm seen on CAT 01/04/09   Tardive dyskinesia     Past Medical History, Surgical history, Social history, and Family history were reviewed and updated as appropriate.   Please see review of systems for further details on the patient's review from today.   Objective:   Physical Exam:  There were no vitals taken for this visit.  Physical Exam Constitutional:      General: She is not in acute distress. Musculoskeletal:        General: No deformity.  Neurological:     Mental Status: She is alert and oriented to person, place, and time.     Coordination: Coordination normal.  Psychiatric:        Attention and Perception: Attention and perception normal. She does not perceive auditory or visual hallucinations.        Mood and Affect: Mood normal. Mood is not anxious or depressed. Affect is not labile, blunt, angry or inappropriate.        Speech: Speech normal.        Behavior: Behavior normal.        Thought Content: Thought content normal. Thought content is not paranoid or delusional. Thought content does not include homicidal or suicidal ideation. Thought content does not include homicidal or suicidal plan.        Cognition and Memory: Cognition and memory normal.        Judgment: Judgment normal.     Comments: Insight intact     Lab Review:     Component Value Date/Time   NA 139 11/01/2020 0345   K 3.6 11/01/2020 0345   CL 106 11/01/2020 0345   CO2 24 11/01/2020 0345   GLUCOSE 92 11/01/2020 0345   BUN 23 11/01/2020 0345   CREATININE 1.18 (H) 11/01/2020 0345   CALCIUM 8.9 11/01/2020 0345   PROT 7.0 01/05/2020 1035   ALBUMIN 4.6 10/30/2007 2244   AST 13 10/30/2007 2244   ALT 11 10/30/2007 2244   ALKPHOS 49 10/30/2007 2244   BILITOT 0.6 10/30/2007 2244   GFRNONAA 48 (L) 11/01/2020 0345   GFRAA 47 (L) 12/06/2019 2242       Component Value Date/Time   WBC 5.4 11/01/2020 0345   RBC 3.33 (L) 11/01/2020 0345   HGB 11.7 (L) 11/01/2020 0345   HCT 33.1  (L) 11/01/2020 0345   PLT 153 11/01/2020 0345   MCV 99.4 11/01/2020 0345   MCH 35.1 (H) 11/01/2020 0345   MCHC 35.3 11/01/2020 0345   RDW 12.4 11/01/2020 0345   LYMPHSABS 0.7 10/31/2020 1107   MONOABS 0.5 10/31/2020 1107   EOSABS 0.2 10/31/2020 1107   BASOSABS 0.0 10/31/2020 1107    No results found for: "POCLITH", "LITHIUM"   No results found for: "PHENYTOIN", "PHENOBARB", "VALPROATE", "CBMZ"   .res Assessment: Plan:    Plan:  PDMP  reviewed  Ingrezza '40mg'$  daily Zoloft '100mg'$  - 2 daily Lamictal '200mg'$  BID Xanax '1mg'$  BID Decrease Wellbutrin XL '300mg'$  to '150mg'$  every morning  - will call in 2 weeks with an update.   Patient seen for 25 minutes and time spent discussing treatment options.  RTC 4 weeks  Therapist - Bambi Cottle  Patient advised to contact office with any questions, adverse effects, or acute worsening in signs and symptoms.  Discussed potential benefits, risk, and side effects of benzodiazepines to include potential risk of tolerance and dependence, as well as possible drowsiness.  Advised patient not to drive if experiencing drowsiness and to take lowest possible effective dose to minimize risk of dependence and tolerance.   There are no diagnoses linked to this encounter.   Please see After Visit Summary for patient specific instructions.  Future Appointments  Date Time Provider [place]  07/18/2022 11:20 AM Kouper Spinella, Berdie Ogren, NP [facility] None  08/20/2022  7:05 AM [facility]ICE REMOTES [facility]  09/23/2022  7:10 AM [facility]ICE REMOTES [facility]  10/28/2022  7:10 AM [facility]ICE REMOT[facility]  12/02/2022  7:00 AM [facility]ICE REMOTES [facility]  01/06/2023  7:00 AM [facility] DEVICE REMOTES [facility]  02/10/2023  7:00 AM [facility]ICE REMOTES [facility]  03/17/2023  7:00 AM [facility] DEVICE REMOTES [facility]     No orders of the defined types were placed in this encounter.   -------------------------------

## 2022-07-21 NOTE — Progress Notes (Signed)
[facility] Summary Report / Loop Recorder 

## 2022-07-25 DIAGNOSIS — F3181 Bipolar II disorder: Secondary | ICD-10-CM | POA: Diagnosis not present

## 2022-08-08 NOTE — Progress Notes (Signed)
[facility] Summary Report / Loop Recorder 

## 2022-08-10 DIAGNOSIS — F319 Bipolar disorder, unspecified: Secondary | ICD-10-CM | POA: Diagnosis not present

## 2022-08-10 DIAGNOSIS — F419 Anxiety disorder, unspecified: Secondary | ICD-10-CM | POA: Diagnosis not present

## 2022-08-10 DIAGNOSIS — I444 Left anterior fascicular block: Secondary | ICD-10-CM | POA: Diagnosis not present

## 2022-08-10 DIAGNOSIS — R0602 Shortness of breath: Secondary | ICD-10-CM | POA: Diagnosis not present

## 2022-08-10 DIAGNOSIS — I4519 Other right bundle-branch block: Secondary | ICD-10-CM | POA: Diagnosis not present

## 2022-08-10 DIAGNOSIS — R079 Chest pain, unspecified: Secondary | ICD-10-CM | POA: Diagnosis not present

## 2022-08-12 DIAGNOSIS — I452 Bifascicular block: Secondary | ICD-10-CM | POA: Diagnosis not present

## 2022-08-12 DIAGNOSIS — I498 Other specified cardiac arrhythmias: Secondary | ICD-10-CM | POA: Diagnosis not present

## 2022-08-12 DIAGNOSIS — I444 Left anterior fascicular block: Secondary | ICD-10-CM | POA: Diagnosis not present

## 2022-08-12 DIAGNOSIS — I4510 Unspecified right bundle-branch block: Secondary | ICD-10-CM | POA: Diagnosis not present

## 2022-08-15 ENCOUNTER — Ambulatory Visit (INDEPENDENT_AMBULATORY_CARE_PROVIDER_SITE_OTHER): Payer: Medicare Other | Admitting: Adult Health

## 2022-08-15 ENCOUNTER — Encounter: Payer: Self-pay | Admitting: Adult Health

## 2022-08-15 DIAGNOSIS — F3181 Bipolar II disorder: Secondary | ICD-10-CM | POA: Diagnosis not present

## 2022-08-15 DIAGNOSIS — G2401 Drug induced subacute dyskinesia: Secondary | ICD-10-CM | POA: Diagnosis not present

## 2022-08-15 DIAGNOSIS — F411 Generalized anxiety disorder: Secondary | ICD-10-CM

## 2022-08-15 MED ORDER — ALPRAZOLAM 1 MG PO TABS: 1.0000 mg | ORAL_TABLET | Freq: Two times a day (BID) | ORAL | 2 refills | Status: DC | PRN

## 2022-08-15 NOTE — Progress Notes (Signed)
[name] [name] [id number] [birth year] 76 y.o.  Subjective:   Patient ID:  [name] [name] is a 76 y.o. (DOB 05-04-1946) female.  Chief Complaint: No chief complaint on file.   HPI [name] [name] presents to the office today for follow-up of BPD-2, GAD and TD.  Referred by [employer] for medication evaluation.  Describes mood today as "ok". Pleasant. Tearful at times. Mood symptoms - reports decreased depression. Denies irritability and anxiety. Denies worry and rumination. Decreased over thinking. Mood is stable. Stating "I'm doing alright". Reporting some health issues - doesn't want to make any further changes right now. Plans to continue the Wellbutrin XL '150mg'$  daily for now. Taking medications as prescribed. Varying interest and motivation. Taking medications as prescribed.  Energy levels lower. Active, does not have a regular exercise routine.  Enjoys some usual interests and activities. Married. Lives with husband of 1 years. Has 2 grown children. Spending time with family and friends. Appetite adequate. Weight stable. Sleep varies day to day.  Focus and concentration stable. Completing tasks. Managing aspects of household. Retired - lower Education officer, museum.  Denies SI or HI.  Reports a history of AH or VH. Denies self harm. Denies substance use.  Previous medication trials: Abilify, Seroquel, [facility] from 06/22/2022 in [facility]  C-SSRS RISK CATEGORY No Risk        Review of Systems:  Review of Systems  Musculoskeletal:  Negative for gait problem.  Neurological:  Negative for tremors.  Psychiatric/Behavioral:         Please refer to HPI    Medications: I have reviewed the patient's current medications.  Current Outpatient Medications  Medication Sig Dispense Refill   ALPRAZolam (XANAX) 1 MG tablet Take 1 tablet (1 mg total) by mouth 2 (two) times daily as needed for anxiety or sleep. 60 tablet 2   amLODipine  (NORVASC) 5 MG tablet Take 5 mg by mouth at bedtime.      buPROPion (WELLBUTRIN XL) 150 MG 24 hr tablet Take one tablet in the morning and one tablet at lunch. 60 tablet 5   lamoTRIgine (LAMICTAL) 200 MG tablet Take 1 tablet (200 mg total) by mouth 2 (two) times daily. 60 tablet 5   Multiple Vitamins-Minerals (MULTIVITAMIN WITH MINERALS) tablet Take 1 tablet by mouth daily.     sertraline (ZOLOFT) 100 MG tablet Take 1 tablet (100 mg total) by mouth 2 (two) times daily. 60 tablet 5   simvastatin (ZOCOR) 40 MG tablet Take 40 mg by mouth at bedtime.      valbenazine (INGREZZA) 40 MG capsule Take 1 capsule (40 mg total) by mouth at bedtime. 30 capsule 5   No current facility-administered medications for this visit.    Medication Side Effects: None  Allergies:  Allergies  Allergen Reactions   Latuda [Lurasidone] Other (See Comments)    Increased suicidal thoughts    Aripiprazole Other (See Comments)    Tardive Dyskinesia Other reaction(s): tardive dyskinesia   Bacitracin-Polymyxin B     Other reaction(s): rash   Losartan Potassium     Other reaction(s): decline gfr   Neo-Bacit-Poly-Lidocaine    Quetiapine Other (See Comments)    Tardive Dyskinesia Other reaction(s): tardive dyskinesia    Past Medical History:  Diagnosis Date   Allergic rhinitis    Bipolar disorder ([place])    Cholelithiasis    seen on CAT 01/04/09   Chronic renal disease, stage III ([place])    3/12  Complication of anesthesia    versed and fentanyl did not work for colonoscopy   Diabetic peripheral neuropathy ([place]) 07/18/2020   DM (diabetes mellitus) ([place])    Edema    Gait abnormality 08/26/2019   HLD (hyperlipidemia)    HTN (hypertension)    Iron deficiency anemia    3/12    Lymphedema    tarda   Neuropathy    Obstructive sleep apnea on CPAP    Osteoarthritis    Renal cyst    2.7 cm seen on CAT 01/04/09   Tardive dyskinesia     Past Medical History, Surgical history, Social history, and Family history  were reviewed and updated as appropriate.   Please see review of systems for further details on the patient's review from today.   Objective:   Physical Exam:  There were no vitals taken for this visit.  Physical Exam Constitutional:      General: She is not in acute distress. Musculoskeletal:        General: No deformity.  Neurological:     Mental Status: She is alert and oriented to person, place, and time.     Coordination: Coordination normal.  Psychiatric:        Attention and Perception: Attention and perception normal. She does not perceive auditory or visual hallucinations.        Mood and Affect: Mood normal. Mood is not anxious or depressed. Affect is not labile, blunt, angry or inappropriate.        Speech: Speech normal.        Behavior: Behavior normal.        Thought Content: Thought content normal. Thought content is not paranoid or delusional. Thought content does not include homicidal or suicidal ideation. Thought content does not include homicidal or suicidal plan.        Cognition and Memory: Cognition and memory normal.        Judgment: Judgment normal.     Comments: Insight intact     Lab Review:     Component Value Date/Time   NA 139 11/01/2020 0345   K 3.6 11/01/2020 0345   CL 106 11/01/2020 0345   CO2 24 11/01/2020 0345   GLUCOSE 92 11/01/2020 0345   BUN 23 11/01/2020 0345   CREATININE 1.18 (H) 11/01/2020 0345   CALCIUM 8.9 11/01/2020 0345   PROT 7.0 01/05/2020 1035   ALBUMIN 4.6 10/30/2007 2244   AST 13 10/30/2007 2244   ALT 11 10/30/2007 2244   ALKPHOS 49 10/30/2007 2244   BILITOT 0.6 10/30/2007 2244   GFRNONAA 48 (L) 11/01/2020 0345   GFRAA 47 (L) 12/06/2019 2242       Component Value Date/Time   WBC 5.4 11/01/2020 0345   RBC 3.33 (L) 11/01/2020 0345   HGB 11.7 (L) 11/01/2020 0345   HCT 33.1 (L) 11/01/2020 0345   PLT 153 11/01/2020 0345   MCV 99.4 11/01/2020 0345   MCH 35.1 (H) 11/01/2020 0345   MCHC 35.3 11/01/2020 0345   RDW  12.4 11/01/2020 0345   LYMPHSABS 0.7 10/31/2020 1107   MONOABS 0.5 10/31/2020 1107   EOSABS 0.2 10/31/2020 1107   BASOSABS 0.0 10/31/2020 1107    No results found for: "POCLITH", "LITHIUM"   No results found for: "PHENYTOIN", "PHENOBARB", "VALPROATE", "CBMZ"   .res Assessment: Plan:    Plan:  PDMP reviewed  Ingrezza '40mg'$  daily Zoloft '100mg'$  - 2 daily Lamictal '200mg'$  BID Xanax '1mg'$  BID Wellbutrin XL '300mg'$  - taking '150mg'$  in the morning and  at lunch  Patient seen for 25 minutes and time spent discussing treatment options.  RTC 4 weeks  Therapist - Bambi Cottle  Patient advised to contact office with any questions, adverse effects, or acute worsening in signs and symptoms.  Discussed potential benefits, risk, and side effects of benzodiazepines to include potential risk of tolerance and dependence, as well as possible drowsiness.  Advised patient not to drive if experiencing drowsiness and to take lowest possible effective dose to minimize risk of dependence and tolerance.   Diagnoses and all orders for this visit:  Bipolar II disorder ([place])  Generalized anxiety disorder -     ALPRAZolam (XANAX) 1 MG tablet; Take 1 tablet (1 mg total) by mouth 2 (two) times daily as needed for anxiety or sleep.  Tardive dyskinesia     Please see After Visit Summary for patient specific instructions.  Future Appointments  Date Time Provider [place]  08/20/2022  7:05 AM [facility] DEVICE REMOTES [facility]  09/23/2022  7:10 AM [facility] DEVICE REMOTES [facility]  10/28/2022  7:10 AM [facility] DEVICE REMOTES [facility]  12/02/2022  7:00 AM [facility] DEVICE REMOTES [facility]  01/06/2023  7:00 AM [facility] DEVICE REMOTES [facility]  02/10/2023  7:00 AM [facility] DEVICE REMOTES [facility]  03/17/2023  7:00 AM [facility] DEVICE REMOTES [facility]    No orders of the defined  types were placed in this encounter.   -------------------------------

## 2022-08-20 ENCOUNTER — Ambulatory Visit (INDEPENDENT_AMBULATORY_CARE_PROVIDER_SITE_OTHER): Payer: Medicare Other

## 2022-08-20 DIAGNOSIS — R55 Syncope and collapse: Secondary | ICD-10-CM

## 2022-08-20 DIAGNOSIS — F3181 Bipolar II disorder: Secondary | ICD-10-CM | POA: Diagnosis not present

## 2022-08-20 LAB — CUP PACEART REMOTE DEVICE CHECK
Date Time Interrogation Session: 20231002231426
Implantable Pulse Generator Implant Date: 20220411

## 2022-08-21 DIAGNOSIS — K529 Noninfective gastroenteritis and colitis, unspecified: Secondary | ICD-10-CM | POA: Diagnosis not present

## 2022-08-21 DIAGNOSIS — N183 Chronic kidney disease, stage 3 unspecified: Secondary | ICD-10-CM | POA: Diagnosis not present

## 2022-08-21 DIAGNOSIS — R0789 Other chest pain: Secondary | ICD-10-CM | POA: Diagnosis not present

## 2022-08-21 DIAGNOSIS — E1142 Type 2 diabetes mellitus with diabetic polyneuropathy: Secondary | ICD-10-CM | POA: Diagnosis not present

## 2022-08-21 DIAGNOSIS — N1831 Chronic kidney disease, stage 3a: Secondary | ICD-10-CM | POA: Diagnosis not present

## 2022-08-21 DIAGNOSIS — I129 Hypertensive chronic kidney disease with stage 1 through stage 4 chronic kidney disease, or unspecified chronic kidney disease: Secondary | ICD-10-CM | POA: Diagnosis not present

## 2022-08-28 DIAGNOSIS — S0990XA Unspecified injury of head, initial encounter: Secondary | ICD-10-CM | POA: Diagnosis not present

## 2022-08-28 DIAGNOSIS — I10 Essential (primary) hypertension: Secondary | ICD-10-CM | POA: Diagnosis not present

## 2022-08-28 DIAGNOSIS — R55 Syncope and collapse: Secondary | ICD-10-CM | POA: Diagnosis not present

## 2022-08-28 DIAGNOSIS — W19XXXA Unspecified fall, initial encounter: Secondary | ICD-10-CM | POA: Diagnosis not present

## 2022-08-29 ENCOUNTER — Telehealth: Payer: Self-pay | Admitting: Internal Medicine

## 2022-08-29 ENCOUNTER — Ambulatory Visit: Payer: PRIVATE HEALTH INSURANCE | Admitting: Physician Assistant

## 2022-08-29 NOTE — Telephone Encounter (Signed)
Follow up call to patient to let her know she showed a normal rhythm during the time of her falls. She does state she felt weak at the time. She is scheduled to come in 08/30/22 for evaluation.

## 2022-08-29 NOTE — Telephone Encounter (Signed)
Pt c/o Syncope: STAT if syncope occurred within 30 minutes and pt complains of lightheadedness High Priority if episode of passing out, completely, today or in last 24 hours   Did you pass out today? No    When is the last time you passed out? yesterday   Has this occurred multiple times? Yes    Did you have any symptoms prior to passing out? Lightheaded; do not remember how long was unconscious; happened back to back

## 2022-08-29 NOTE — Telephone Encounter (Signed)
Patient reports 2 falls this week possibly due to syncope. She states she does not remember how, but she experienced a fall on 10/9 and 10/10 at approximately 11:00 PM. She was using her walker both times.   I advised her I would forward to the device clinic to review her loop recorder and that someone would follow up with her.  Patient agree to this plan.

## 2022-08-29 NOTE — Telephone Encounter (Signed)
Sinus rhythm around 60+ BPM.

## 2022-08-30 ENCOUNTER — Ambulatory Visit: Payer: PRIVATE HEALTH INSURANCE | Admitting: Physician Assistant

## 2022-08-30 NOTE — Progress Notes (Deleted)
Cardiology Office Note:    Date:  08/30/2022   ID:  [name] [name], DOB May 14, 1946, MRN [id number]  PCP:  [name], MD  [facility] Cardiologist:  None  [facility] Electrophysiologist:  None   Chief Complaint: weakness and fall   History of Present Illness:    [name] [name] is a 76 y.o. female with a hx of paroxysmal atrial fibrillation with bifascicular block, falls, tardive dyskinesia related to medication issues neuropathy-idiopathic, bipolar disorder, HTN, HLD, OSA on CPAP, DM and CKD seen for weakness and falls.   Echo 10/2020: LVEF 60-65% and grade 1 DD Monitor 12/2020: Intermittent episodes of brief atrial fibrillation   Seen by Dr. Caryl Comes March 2022 for recurrent falls. Hx of bradycardia and paroxysmal atrial fibrillation with bifascicular block. Underwent ILR placement 02/2021.  Last ILR check 08/19/22 without afib.     Past Medical History:  Diagnosis Date   Allergic rhinitis    Bipolar disorder ([place])    Cholelithiasis    seen on CAT 01/04/09   Chronic renal disease, stage III ([place])    2/54   Complication of anesthesia    versed and fentanyl did not work for colonoscopy   Diabetic peripheral neuropathy ([place]) 07/18/2020   DM (diabetes mellitus) ([place])    Edema    Gait abnormality 08/26/2019   HLD (hyperlipidemia)    HTN (hypertension)    Iron deficiency anemia    3/12    Lymphedema    tarda   Neuropathy    Obstructive sleep apnea on CPAP    Osteoarthritis    Renal cyst    2.7 cm seen on CAT 01/04/09   Tardive dyskinesia     Past Surgical History:  Procedure Laterality Date   ABDOMINAL HYSTERECTOMY     BALLOON DILATION N/A 04/24/2020   Procedure: BALLOON DILATION;  Surgeon: Ronnette Juniper, MD;  Location: [facility];  Service: Gastroenterology;  Laterality: N/A;   BIOPSY  04/24/2020   Procedure: BIOPSY;  Surgeon: Ronnette Juniper, MD;  Location: [facility];  Service: Gastroenterology;;   BOTOX INJECTION N/A 04/24/2020   Procedure: POSSIBLE BOTOX  INJECTION;  Surgeon: Ronnette Juniper, MD;  Location: [facility];  Service: Gastroenterology;  Laterality: N/A;   COLONOSCOPY  2009; 07/09/11   2009:normal (polyp was not adenoma); 2012:    DILATION AND CURETTAGE OF UTERUS     ESOPHAGEAL MANOMETRY N/A 05/31/2020   Procedure: ESOPHAGEAL MANOMETRY (EM);  Surgeon: Ronnette Juniper, MD;  Location: [facility];  Service: Gastroenterology;  Laterality: N/A;   ESOPHAGOGASTRODUODENOSCOPY  07/09/11   Erosive gastritis   ESOPHAGOGASTRODUODENOSCOPY (EGD) WITH PROPOFOL N/A 04/24/2020   Procedure: ESOPHAGOGASTRODUODENOSCOPY (EGD) WITH PROPOFOL;  Surgeon: Ronnette Juniper, MD;  Location: [facility];  Service: Gastroenterology;  Laterality: N/A;   FRACTURE SURGERY     rt fx upper arm   GIVENS CAPSULE STUDY  07/25/2011   normal small bowel   REIMPLANT URETER IN BLADDER  1996   TRIGGER FINGER RELEASE Left 02/04/2013   Procedure: RELEASE TRIGGER FINGER/A-1 PULLEY LEFT RING FINGER;  Surgeon: Tennis Must, MD;  Location: [facility];  Service: Orthopedics;  Laterality: Left;    Current Medications: No outpatient medications have been marked as taking for the 08/30/22 encounter (Appointment) with Leanor Kail, [place].     Allergies:   Latuda [lurasidone], Aripiprazole, Bacitracin-polymyxin b, Losartan potassium, Neo-bacit-poly-lidocaine, and Quetiapine   Social History   Socioeconomic History   Marital status: Married    Spouse name: Not on file   Number of  children: 2   Years of education: Not on file   Highest education level: Not on file  Occupational History   Occupation: Retired Pharmacist, hospital  Tobacco Use   Smoking status: Former    Types: Cigarettes    Quit date: 02/02/1991    Years since quitting: 31.5   Smokeless tobacco: Never  Substance and Sexual Activity   Alcohol use: Not Currently    Comment: less than 1 a day   Drug use: No   Sexual activity: Not on file  Other Topics Concern   Not on file  Social History Narrative   Right handed     Caffeine ~1-2 cups every other day    Lives at home with husband    Social Determinants of Health   Financial Resource Strain: Not on file  Food Insecurity: Not on file  Transportation Needs: Not on file  Physical Activity: Not on file  Stress: Not on file  Social Connections: Not on file     Family History: The patient's family history includes Diabetes in her mother; Heart attack in her father; Heart disease in her father; Hyperlipidemia in her mother. There is no history of Colon cancer.  ***  ROS:   Please see the history of present illness.    All other systems reviewed and are negative. ***  EKGs/Labs/Other Studies Reviewed:    The following studies were reviewed today:  Echo 10/2020 1. Left ventricular ejection fraction, by estimation, is 60 to 65%. The  left ventricle has normal function. The left ventricle has no regional  wall motion abnormalities. Left ventricular diastolic parameters are  consistent with Grade I diastolic  dysfunction (impaired relaxation).   2. Right ventricular systolic function is normal. The right ventricular  size is normal. Tricuspid regurgitation signal is inadequate for assessing  PA pressure.   3. The mitral valve is normal in structure. Trivial mitral valve  regurgitation. No evidence of mitral stenosis.   4. The aortic valve is tricuspid. Aortic valve regurgitation is not  visualized. Mild aortic valve sclerosis is present, with no evidence of  aortic valve stenosis.   5. The inferior vena cava is normal in size with greater than 50%  respiratory variability, suggesting right atrial pressure of 3 mmHg.    EKG:  EKG is *** ordered today.  The ekg ordered today demonstrates ***  Recent Labs: No results found for requested labs within last 365 days.  Recent Lipid Panel    Component Value Date/Time   CHOL 190 11/01/2020 0345   TRIG 104 11/01/2020 0345   HDL 86 11/01/2020 0345   CHOLHDL 2.2 11/01/2020 0345   VLDL 21 11/01/2020  0345   LDLCALC 83 11/01/2020 0345     Risk Assessment/Calculations:   {Does this patient have ATRIAL FIBRILLATION?:[phone]}   Physical Exam:    VS:  There were no vitals taken for this visit.    Wt Readings from Last 3 Encounters:  02/26/21 167 lb (75.8 kg)  01/17/21 161 lb (73 kg)  11/01/20 166 lb 10.7 oz (75.6 kg)     GEN: *** Well nourished, well developed in no acute distress HEENT: Normal NECK: No JVD; No carotid bruits LYMPHATICS: No lymphadenopathy CARDIAC: ***RRR, no murmurs, rubs, gallops RESPIRATORY:  Clear to auscultation without rales, wheezing or rhonchi  ABDOMEN: Soft, non-tender, non-distended MUSCULOSKELETAL:  No edema; No deformity  SKIN: Warm and dry NEUROLOGIC:  Alert and oriented x 3 PSYCHIATRIC:  Normal affect   ASSESSMENT AND PLAN:    ***  2. ***  Medication Adjustments/Labs and Tests Ordered: Current medicines are reviewed at length with the patient today.  Concerns regarding medicines are outlined above.  No orders of the defined types were placed in this encounter.  No orders of the defined types were placed in this encounter.   There are no Patient Instructions on file for this visit.   Jarrett Soho, [place]  08/30/2022 12:33 PM    [facility]

## 2022-09-02 NOTE — Progress Notes (Signed)
[facility] Summary Report / Loop Recorder 

## 2022-09-18 ENCOUNTER — Inpatient Hospital Stay (HOSPITAL_COMMUNITY)
Admission: EM | Admit: 2022-09-18 | Discharge: 2022-09-26 | DRG: 074 | Disposition: A | Discharge status: Skilled Nursing Facility | Payer: Medicare Other | Source: Ambulatory Visit | Attending: Internal Medicine | Admitting: Internal Medicine

## 2022-09-18 ENCOUNTER — Emergency Department (HOSPITAL_COMMUNITY): Payer: Medicare Other

## 2022-09-18 ENCOUNTER — Encounter (HOSPITAL_COMMUNITY): Payer: Self-pay | Admitting: Emergency Medicine

## 2022-09-18 ENCOUNTER — Other Ambulatory Visit: Payer: Self-pay

## 2022-09-18 DIAGNOSIS — S8011XA Contusion of right lower leg, initial encounter: Secondary | ICD-10-CM | POA: Diagnosis present

## 2022-09-18 DIAGNOSIS — E785 Hyperlipidemia, unspecified: Secondary | ICD-10-CM | POA: Diagnosis present

## 2022-09-18 DIAGNOSIS — Z79899 Other long term (current) drug therapy: Secondary | ICD-10-CM | POA: Diagnosis not present

## 2022-09-18 DIAGNOSIS — E1142 Type 2 diabetes mellitus with diabetic polyneuropathy: Secondary | ICD-10-CM | POA: Diagnosis present

## 2022-09-18 DIAGNOSIS — Z66 Do not resuscitate: Secondary | ICD-10-CM | POA: Diagnosis present

## 2022-09-18 DIAGNOSIS — D509 Iron deficiency anemia, unspecified: Secondary | ICD-10-CM | POA: Diagnosis present

## 2022-09-18 DIAGNOSIS — D7589 Other specified diseases of blood and blood-forming organs: Secondary | ICD-10-CM | POA: Diagnosis present

## 2022-09-18 DIAGNOSIS — R2689 Other abnormalities of gait and mobility: Secondary | ICD-10-CM | POA: Diagnosis present

## 2022-09-18 DIAGNOSIS — R110 Nausea: Secondary | ICD-10-CM | POA: Diagnosis not present

## 2022-09-18 DIAGNOSIS — G2401 Drug induced subacute dyskinesia: Secondary | ICD-10-CM | POA: Diagnosis present

## 2022-09-18 DIAGNOSIS — Z90710 Acquired absence of both cervix and uterus: Secondary | ICD-10-CM

## 2022-09-18 DIAGNOSIS — Z87891 Personal history of nicotine dependence: Secondary | ICD-10-CM | POA: Diagnosis not present

## 2022-09-18 DIAGNOSIS — Z6837 Body mass index (BMI) 37.0-37.9, adult: Secondary | ICD-10-CM

## 2022-09-18 DIAGNOSIS — Z888 Allergy status to other drugs, medicaments and biological substances status: Secondary | ICD-10-CM

## 2022-09-18 DIAGNOSIS — N1832 Chronic kidney disease, stage 3b: Secondary | ICD-10-CM | POA: Diagnosis present

## 2022-09-18 DIAGNOSIS — E669 Obesity, unspecified: Secondary | ICD-10-CM | POA: Diagnosis present

## 2022-09-18 DIAGNOSIS — R278 Other lack of coordination: Secondary | ICD-10-CM | POA: Diagnosis not present

## 2022-09-18 DIAGNOSIS — M6281 Muscle weakness (generalized): Secondary | ICD-10-CM | POA: Diagnosis not present

## 2022-09-18 DIAGNOSIS — E1122 Type 2 diabetes mellitus with diabetic chronic kidney disease: Secondary | ICD-10-CM | POA: Diagnosis present

## 2022-09-18 DIAGNOSIS — N179 Acute kidney failure, unspecified: Secondary | ICD-10-CM | POA: Diagnosis not present

## 2022-09-18 DIAGNOSIS — R2681 Unsteadiness on feet: Secondary | ICD-10-CM

## 2022-09-18 DIAGNOSIS — W19XXXA Unspecified fall, initial encounter: Secondary | ICD-10-CM | POA: Diagnosis present

## 2022-09-18 DIAGNOSIS — Z515 Encounter for palliative care: Secondary | ICD-10-CM

## 2022-09-18 DIAGNOSIS — R55 Syncope and collapse: Secondary | ICD-10-CM | POA: Diagnosis not present

## 2022-09-18 DIAGNOSIS — R296 Repeated falls: Secondary | ICD-10-CM

## 2022-09-18 DIAGNOSIS — M1990 Unspecified osteoarthritis, unspecified site: Secondary | ICD-10-CM | POA: Diagnosis present

## 2022-09-18 DIAGNOSIS — R531 Weakness: Secondary | ICD-10-CM

## 2022-09-18 DIAGNOSIS — Z7189 Other specified counseling: Secondary | ICD-10-CM | POA: Diagnosis not present

## 2022-09-18 DIAGNOSIS — M7989 Other specified soft tissue disorders: Secondary | ICD-10-CM | POA: Diagnosis not present

## 2022-09-18 DIAGNOSIS — Z7401 Bed confinement status: Secondary | ICD-10-CM | POA: Diagnosis not present

## 2022-09-18 DIAGNOSIS — I10 Essential (primary) hypertension: Secondary | ICD-10-CM | POA: Diagnosis present

## 2022-09-18 DIAGNOSIS — F3181 Bipolar II disorder: Secondary | ICD-10-CM | POA: Diagnosis not present

## 2022-09-18 DIAGNOSIS — I129 Hypertensive chronic kidney disease with stage 1 through stage 4 chronic kidney disease, or unspecified chronic kidney disease: Secondary | ICD-10-CM | POA: Diagnosis present

## 2022-09-18 DIAGNOSIS — F411 Generalized anxiety disorder: Secondary | ICD-10-CM | POA: Diagnosis not present

## 2022-09-18 DIAGNOSIS — G249 Dystonia, unspecified: Secondary | ICD-10-CM | POA: Diagnosis present

## 2022-09-18 DIAGNOSIS — K5900 Constipation, unspecified: Secondary | ICD-10-CM | POA: Diagnosis present

## 2022-09-18 DIAGNOSIS — F319 Bipolar disorder, unspecified: Secondary | ICD-10-CM | POA: Diagnosis not present

## 2022-09-18 DIAGNOSIS — G4733 Obstructive sleep apnea (adult) (pediatric): Secondary | ICD-10-CM | POA: Diagnosis present

## 2022-09-18 DIAGNOSIS — Z83438 Family history of other disorder of lipoprotein metabolism and other lipidemia: Secondary | ICD-10-CM

## 2022-09-18 DIAGNOSIS — R41841 Cognitive communication deficit: Secondary | ICD-10-CM | POA: Diagnosis not present

## 2022-09-18 DIAGNOSIS — Z8249 Family history of ischemic heart disease and other diseases of the circulatory system: Secondary | ICD-10-CM

## 2022-09-18 DIAGNOSIS — Z833 Family history of diabetes mellitus: Secondary | ICD-10-CM

## 2022-09-18 LAB — URINALYSIS, ROUTINE W REFLEX MICROSCOPIC
Bilirubin Urine: NEGATIVE
Glucose, UA: NEGATIVE mg/dL
Hgb urine dipstick: NEGATIVE
Ketones, ur: NEGATIVE mg/dL
Nitrite: NEGATIVE
Protein, ur: NEGATIVE mg/dL
Specific Gravity, Urine: 1.016 (ref 1.005–1.030)
WBC, UA: >50 WBC/hpf — ABNORMAL HIGH (ref 0–5)
pH: 5 (ref 5.0–8.0)

## 2022-09-18 LAB — COMPREHENSIVE METABOLIC PANEL
ALT: 15 U/L (ref 0–44)
AST: 21 U/L (ref 15–41)
Albumin: 3.9 g/dL (ref 3.5–5.0)
Alkaline Phosphatase: 53 U/L (ref 38–126)
Anion gap: 10 (ref 5–15)
BUN: 30 mg/dL — ABNORMAL HIGH (ref 8–23)
CO2: 24 mmol/L (ref 22–32)
Calcium: 9.3 mg/dL (ref 8.9–10.3)
Chloride: 105 mmol/L (ref 98–111)
Creatinine, Ser: 1.45 mg/dL — ABNORMAL HIGH (ref 0.44–1.00)
GFR, Estimated: 37 mL/min — ABNORMAL LOW (ref >60)
Glucose, Bld: 91 mg/dL (ref 70–99)
Potassium: 4.2 mmol/L (ref 3.5–5.1)
Sodium: 139 mmol/L (ref 135–145)
Total Bilirubin: 0.6 mg/dL (ref 0.3–1.2)
Total Protein: 7.1 g/dL (ref 6.5–8.1)

## 2022-09-18 LAB — CBC
HCT: 42.6 % (ref 36.0–46.0)
Hemoglobin: 13.9 g/dL (ref 12.0–15.0)
MCH: 33.4 pg (ref 26.0–34.0)
MCHC: 32.6 g/dL (ref 30.0–36.0)
MCV: 102.4 fL — ABNORMAL HIGH (ref 80.0–100.0)
Platelets: 175 10*3/uL (ref 150–400)
RBC: 4.16 MIL/uL (ref 3.87–5.11)
RDW: 12.6 % (ref 11.5–15.5)
WBC: 8.5 10*3/uL (ref 4.0–10.5)
nRBC: 0 % (ref 0.0–0.2)

## 2022-09-18 LAB — TROPONIN I (HIGH SENSITIVITY)
Troponin I (High Sensitivity): 7 ng/L (ref <18)
Troponin I (High Sensitivity): 7 ng/L (ref <18)

## 2022-09-18 LAB — MAGNESIUM: Magnesium: 2.1 mg/dL (ref 1.7–2.4)

## 2022-09-18 LAB — BRAIN NATRIURETIC PEPTIDE: B Natriuretic Peptide: 153.2 pg/mL — ABNORMAL HIGH (ref 0.0–100.0)

## 2022-09-18 LAB — CBG MONITORING, ED: Glucose-Capillary: 87 mg/dL (ref 70–99)

## 2022-09-18 MED ORDER — LACTATED RINGERS IV BOLUS
1000.0000 mL | Freq: Once | INTRAVENOUS | Status: AC
  Administered 2022-09-18: 1000 mL via INTRAVENOUS

## 2022-09-18 MED ORDER — SODIUM CHLORIDE 0.9 % IV SOLN
1.0000 g | Freq: Once | INTRAVENOUS | Status: AC
  Administered 2022-09-19: 1 g via INTRAVENOUS
  Filled 2022-09-18: qty 10

## 2022-09-18 MED ORDER — IOHEXOL 350 MG/ML SOLN
60.0000 mL | Freq: Once | INTRAVENOUS | Status: AC | PRN
  Administered 2022-09-19: 60 mL via INTRAVENOUS

## 2022-09-18 NOTE — ED Triage Notes (Signed)
Pt BIBA Per EMS: Pt coming from dr office w/ c/o increased weakness & increased fall over the last few weeks. Pt reports poor appetite over past 6 months.  VSS

## 2022-09-18 NOTE — ED Provider Triage Note (Signed)
Emergency Medicine Provider Triage Evaluation Note  [name] [name] , a 76 y.o. female  was evaluated in triage.  Pt complains of generalized weakness.  She complains of weakness which is been ongoing for the past week.  She endorses mild shortness of breath and a feeling of lightheadedness.  She also endorses frequent falls.  She notes she occasionally feels somewhat dizzy before these falls.  She denies chest pain or abdominal pain, denies nausea, vomiting.  She states her appetite has been decreased for the past 6 weeks.  Review of Systems  Positive: As above Negative: As above  Physical Exam  There were no vitals taken for this visit. Gen:   Awake, no distress   Resp:  Normal effort  MSK:   Moves extremities without difficulty  Other:  Swelling noted to bilateral lower extremities, patient with history of lymphedema  Medical Decision Making  Medically screening exam initiated at 12:47 PM.  Appropriate orders placed.  Ileene Patrick was informed that the remainder of the evaluation will be completed by another provider, this initial triage assessment does not replace that evaluation, and the importance of remaining in the ED until their evaluation is complete.     Dorothyann Peng, PA-C 09/18/22 1251

## 2022-09-18 NOTE — ED Notes (Signed)
Lab draw of second troponin unsuccessful

## 2022-09-18 NOTE — ED Provider Notes (Signed)
[facility] DEPT Provider Note   CSN: [id number] Arrival date & time: 09/18/22  1230     History {Add pertinent medical, surgical, social history, OB history to HPI:1} Chief Complaint  Patient presents with   weakness    [name] [name] is a 76 y.o. female.  HPI     Home Medications Prior to Admission medications   Medication Sig Start Date End Date Taking? Authorizing Provider  ALPRAZolam Duanne Moron) 1 MG tablet Take 1 tablet (1 mg total) by mouth 2 (two) times daily as needed for anxiety or sleep. 08/15/22   Mozingo, Berdie Ogren, NP  amLODipine (NORVASC) 5 MG tablet Take 5 mg by mouth at bedtime.     [provider]  buPROPion (WELLBUTRIN XL) 150 MG 24 hr tablet Take one tablet in the morning and one tablet at lunch. 06/20/22   Mozingo, Berdie Ogren, NP  lamoTRIgine (LAMICTAL) 200 MG tablet Take 1 tablet (200 mg total) by mouth 2 (two) times daily. 05/23/22   Mozingo, Berdie Ogren, NP  Multiple Vitamins-Minerals (MULTIVITAMIN WITH MINERALS) tablet Take 1 tablet by mouth daily.    [provider]  sertraline (ZOLOFT) 100 MG tablet Take 1 tablet (100 mg total) by mouth 2 (two) times daily. 05/23/22   Mozingo, Berdie Ogren, NP  simvastatin (ZOCOR) 40 MG tablet Take 40 mg by mouth at bedtime.     [provider]  valbenazine (INGREZZA) 40 MG capsule Take 1 capsule (40 mg total) by mouth at bedtime. 05/23/22   Mozingo, Berdie Ogren, NP      Allergies    Latuda [lurasidone], Aripiprazole, Bacitracin-polymyxin b, Losartan potassium, Neo-bacit-poly-lidocaine, and Quetiapine    Review of Systems   Review of Systems  Physical Exam Updated Vital Signs BP 128/84 (BP Location: Left Arm)   Pulse 65   Temp 98.9 F (37.2 C) (Oral)   Resp 16   SpO2 96%  Physical Exam  ED Results / Procedures / Treatments   Labs (all labs ordered are listed, but only abnormal results are displayed) Labs Reviewed  CBC - Abnormal; Notable  for the following components:      Result Value   MCV 102.4 (*)    All other components within normal limits  COMPREHENSIVE METABOLIC PANEL - Abnormal; Notable for the following components:   BUN 30 (*)    Creatinine, Ser 1.45 (*)    GFR, Estimated 37 (*)    All other components within normal limits  BRAIN NATRIURETIC PEPTIDE - Abnormal; Notable for the following components:   B Natriuretic Peptide 153.2 (*)    All other components within normal limits  URINALYSIS, ROUTINE W REFLEX MICROSCOPIC  CBG MONITORING, ED  TROPONIN I (HIGH SENSITIVITY)  TROPONIN I (HIGH SENSITIVITY)    EKG EKG Interpretation  Date/Time:  Wednesday September 18 2022 12:55:11 EDT Ventricular Rate:  61 PR Interval:  177 QRS Duration: 140 QT Interval:  456 QTC Calculation: 460 R Axis:   -47 Text Interpretation: Sinus rhythm Atrial premature complex RBBB and LAFB Confirmed by Nanda Quinton [phone]) on 09/18/2022 12:59:55 PM  Radiology No results found.  Procedures Procedures  {Document cardiac monitor, telemetry assessment procedure when appropriate:1}  Medications Ordered in ED Medications - No data to display  ED Course/ Medical Decision Making/ A&P                           Medical Decision Making  ***  {Document critical care time when  appropriate:1} {Document review of labs and clinical decision tools ie heart score, Chads2Vasc2 etc:1}  {Document your independent review of radiology images, and any outside records:1} {Document your discussion with family members, caretakers, and with consultants:1} {Document social determinants of health affecting pt's care:1} {Document your decision making why or why not admission, treatments were needed:1} Final Clinical Impression(s) / ED Diagnoses Final diagnoses:  None    Rx / DC Orders ED Discharge Orders     None

## 2022-09-19 ENCOUNTER — Inpatient Hospital Stay (HOSPITAL_COMMUNITY): Payer: Medicare Other

## 2022-09-19 ENCOUNTER — Encounter (HOSPITAL_COMMUNITY): Payer: Self-pay | Admitting: Family Medicine

## 2022-09-19 ENCOUNTER — Emergency Department (HOSPITAL_COMMUNITY): Payer: Medicare Other

## 2022-09-19 DIAGNOSIS — N179 Acute kidney failure, unspecified: Secondary | ICD-10-CM | POA: Diagnosis not present

## 2022-09-19 DIAGNOSIS — E1142 Type 2 diabetes mellitus with diabetic polyneuropathy: Secondary | ICD-10-CM | POA: Diagnosis present

## 2022-09-19 DIAGNOSIS — R55 Syncope and collapse: Secondary | ICD-10-CM | POA: Diagnosis not present

## 2022-09-19 DIAGNOSIS — E785 Hyperlipidemia, unspecified: Secondary | ICD-10-CM | POA: Diagnosis present

## 2022-09-19 DIAGNOSIS — Z7401 Bed confinement status: Secondary | ICD-10-CM | POA: Diagnosis not present

## 2022-09-19 DIAGNOSIS — E1122 Type 2 diabetes mellitus with diabetic chronic kidney disease: Secondary | ICD-10-CM | POA: Diagnosis present

## 2022-09-19 DIAGNOSIS — F411 Generalized anxiety disorder: Secondary | ICD-10-CM | POA: Diagnosis present

## 2022-09-19 DIAGNOSIS — M6281 Muscle weakness (generalized): Secondary | ICD-10-CM | POA: Diagnosis not present

## 2022-09-19 DIAGNOSIS — R2681 Unsteadiness on feet: Secondary | ICD-10-CM | POA: Diagnosis present

## 2022-09-19 DIAGNOSIS — M7989 Other specified soft tissue disorders: Secondary | ICD-10-CM | POA: Diagnosis not present

## 2022-09-19 DIAGNOSIS — K5900 Constipation, unspecified: Secondary | ICD-10-CM | POA: Diagnosis present

## 2022-09-19 DIAGNOSIS — N1832 Chronic kidney disease, stage 3b: Secondary | ICD-10-CM | POA: Diagnosis present

## 2022-09-19 DIAGNOSIS — R531 Weakness: Secondary | ICD-10-CM

## 2022-09-19 DIAGNOSIS — E669 Obesity, unspecified: Secondary | ICD-10-CM | POA: Diagnosis present

## 2022-09-19 DIAGNOSIS — F319 Bipolar disorder, unspecified: Secondary | ICD-10-CM | POA: Diagnosis not present

## 2022-09-19 DIAGNOSIS — Z6837 Body mass index (BMI) 37.0-37.9, adult: Secondary | ICD-10-CM | POA: Diagnosis not present

## 2022-09-19 DIAGNOSIS — G2401 Drug induced subacute dyskinesia: Secondary | ICD-10-CM | POA: Diagnosis present

## 2022-09-19 DIAGNOSIS — D7589 Other specified diseases of blood and blood-forming organs: Secondary | ICD-10-CM | POA: Diagnosis present

## 2022-09-19 DIAGNOSIS — M1990 Unspecified osteoarthritis, unspecified site: Secondary | ICD-10-CM | POA: Diagnosis present

## 2022-09-19 DIAGNOSIS — I129 Hypertensive chronic kidney disease with stage 1 through stage 4 chronic kidney disease, or unspecified chronic kidney disease: Secondary | ICD-10-CM | POA: Diagnosis present

## 2022-09-19 DIAGNOSIS — Z66 Do not resuscitate: Secondary | ICD-10-CM | POA: Diagnosis present

## 2022-09-19 DIAGNOSIS — D509 Iron deficiency anemia, unspecified: Secondary | ICD-10-CM | POA: Diagnosis present

## 2022-09-19 DIAGNOSIS — R2689 Other abnormalities of gait and mobility: Secondary | ICD-10-CM | POA: Diagnosis present

## 2022-09-19 DIAGNOSIS — R296 Repeated falls: Secondary | ICD-10-CM

## 2022-09-19 DIAGNOSIS — Z7189 Other specified counseling: Secondary | ICD-10-CM | POA: Diagnosis not present

## 2022-09-19 DIAGNOSIS — W19XXXA Unspecified fall, initial encounter: Secondary | ICD-10-CM | POA: Diagnosis present

## 2022-09-19 DIAGNOSIS — S8011XA Contusion of right lower leg, initial encounter: Secondary | ICD-10-CM | POA: Diagnosis present

## 2022-09-19 DIAGNOSIS — R278 Other lack of coordination: Secondary | ICD-10-CM | POA: Diagnosis not present

## 2022-09-19 DIAGNOSIS — R41841 Cognitive communication deficit: Secondary | ICD-10-CM | POA: Diagnosis not present

## 2022-09-19 DIAGNOSIS — Z87891 Personal history of nicotine dependence: Secondary | ICD-10-CM | POA: Diagnosis not present

## 2022-09-19 DIAGNOSIS — Z515 Encounter for palliative care: Secondary | ICD-10-CM | POA: Diagnosis not present

## 2022-09-19 DIAGNOSIS — Z79899 Other long term (current) drug therapy: Secondary | ICD-10-CM | POA: Diagnosis not present

## 2022-09-19 DIAGNOSIS — I10 Essential (primary) hypertension: Secondary | ICD-10-CM | POA: Diagnosis not present

## 2022-09-19 DIAGNOSIS — R110 Nausea: Secondary | ICD-10-CM | POA: Diagnosis not present

## 2022-09-19 LAB — BASIC METABOLIC PANEL
Anion gap: 11 (ref 5–15)
BUN: 27 mg/dL — ABNORMAL HIGH (ref 8–23)
CO2: 23 mmol/L (ref 22–32)
Calcium: 9.3 mg/dL (ref 8.9–10.3)
Chloride: 103 mmol/L (ref 98–111)
Creatinine, Ser: 1.42 mg/dL — ABNORMAL HIGH (ref 0.44–1.00)
GFR, Estimated: 38 mL/min — ABNORMAL LOW (ref >60)
Glucose, Bld: 80 mg/dL (ref 70–99)
Potassium: 4.4 mmol/L (ref 3.5–5.1)
Sodium: 137 mmol/L (ref 135–145)

## 2022-09-19 LAB — CBC
HCT: 39.7 % (ref 36.0–46.0)
Hemoglobin: 13.2 g/dL (ref 12.0–15.0)
MCH: 33.9 pg (ref 26.0–34.0)
MCHC: 33.2 g/dL (ref 30.0–36.0)
MCV: 102.1 fL — ABNORMAL HIGH (ref 80.0–100.0)
Platelets: 173 10*3/uL (ref 150–400)
RBC: 3.89 MIL/uL (ref 3.87–5.11)
RDW: 12.7 % (ref 11.5–15.5)
WBC: 8 10*3/uL (ref 4.0–10.5)
nRBC: 0 % (ref 0.0–0.2)

## 2022-09-19 LAB — TSH: TSH: 2.148 u[IU]/mL (ref 0.350–4.500)

## 2022-09-19 LAB — FOLATE: Folate: 28.6 ng/mL (ref >5.9)

## 2022-09-19 LAB — VITAMIN B12: Vitamin B-12: 626 pg/mL (ref 180–914)

## 2022-09-19 MED ORDER — BUPROPION HCL ER (XL) 150 MG PO TB24
150.0000 mg | ORAL_TABLET | Freq: Every day | ORAL | Status: DC
  Administered 2022-09-19 – 2022-09-26 (×8): 150 mg via ORAL
  Filled 2022-09-19 (×8): qty 1

## 2022-09-19 MED ORDER — ONDANSETRON HCL 4 MG PO TABS: 4.0000 mg | ORAL_TABLET | Freq: Four times a day (QID) | ORAL | Status: DC | PRN

## 2022-09-19 MED ORDER — LAMOTRIGINE 100 MG PO TABS
200.0000 mg | ORAL_TABLET | Freq: Two times a day (BID) | ORAL | Status: DC
  Administered 2022-09-19 – 2022-09-26 (×15): 200 mg via ORAL
  Filled 2022-09-19 (×15): qty 2

## 2022-09-19 MED ORDER — SIMVASTATIN 20 MG PO TABS: 40.0000 mg | ORAL_TABLET | Freq: Every day | ORAL | Status: DC

## 2022-09-19 MED ORDER — ATORVASTATIN CALCIUM 20 MG PO TABS
20.0000 mg | ORAL_TABLET | Freq: Every day | ORAL | Status: DC
  Administered 2022-09-19 – 2022-09-25 (×7): 20 mg via ORAL
  Filled 2022-09-19 (×7): qty 1

## 2022-09-19 MED ORDER — HEPARIN SODIUM (PORCINE) 5000 UNIT/ML IJ SOLN
5000.0000 [IU] | Freq: Three times a day (TID) | INTRAMUSCULAR | Status: DC
  Administered 2022-09-19 – 2022-09-26 (×22): 5000 [IU] via SUBCUTANEOUS
  Filled 2022-09-19 (×22): qty 1

## 2022-09-19 MED ORDER — ACETAMINOPHEN 325 MG PO TABS
650.0000 mg | ORAL_TABLET | Freq: Four times a day (QID) | ORAL | Status: DC | PRN
  Administered 2022-09-21 – 2022-09-22 (×6): 650 mg via ORAL
  Filled 2022-09-19 (×6): qty 2

## 2022-09-19 MED ORDER — SODIUM CHLORIDE 0.9% FLUSH
3.0000 mL | Freq: Two times a day (BID) | INTRAVENOUS | Status: DC
  Administered 2022-09-19 – 2022-09-26 (×16): 3 mL via INTRAVENOUS

## 2022-09-19 MED ORDER — ALPRAZOLAM 0.5 MG PO TABS
1.0000 mg | ORAL_TABLET | Freq: Two times a day (BID) | ORAL | Status: DC | PRN
  Administered 2022-09-21 – 2022-09-25 (×7): 1 mg via ORAL
  Filled 2022-09-19 (×9): qty 2

## 2022-09-19 MED ORDER — VALBENAZINE TOSYLATE 40 MG PO CAPS
40.0000 mg | ORAL_CAPSULE | Freq: Every day | ORAL | Status: DC
  Administered 2022-09-19 – 2022-09-25 (×6): 40 mg via ORAL
  Filled 2022-09-19: qty 1

## 2022-09-19 MED ORDER — SENNOSIDES-DOCUSATE SODIUM 8.6-50 MG PO TABS: 1.0000 | ORAL_TABLET | Freq: Every evening | ORAL | Status: DC | PRN

## 2022-09-19 MED ORDER — AMLODIPINE BESYLATE 5 MG PO TABS
5.0000 mg | ORAL_TABLET | Freq: Every day | ORAL | Status: DC
  Administered 2022-09-19: 5 mg via ORAL
  Filled 2022-09-19: qty 1

## 2022-09-19 MED ORDER — ACETAMINOPHEN 650 MG RE SUPP: 650.0000 mg | Freq: Four times a day (QID) | RECTAL | Status: DC | PRN

## 2022-09-19 MED ORDER — ONDANSETRON HCL 4 MG/2ML IJ SOLN
4.0000 mg | Freq: Four times a day (QID) | INTRAMUSCULAR | Status: DC | PRN
  Administered 2022-09-21: 4 mg via INTRAVENOUS
  Filled 2022-09-19: qty 2

## 2022-09-19 MED ORDER — SERTRALINE HCL 100 MG PO TABS
100.0000 mg | ORAL_TABLET | Freq: Two times a day (BID) | ORAL | Status: DC
  Administered 2022-09-19 – 2022-09-26 (×15): 100 mg via ORAL
  Filled 2022-09-19: qty 1
  Filled 2022-09-19: qty 2
  Filled 2022-09-19 (×13): qty 1

## 2022-09-19 MED ORDER — COLESTIPOL HCL 1 G PO TABS
1.0000 g | ORAL_TABLET | Freq: Two times a day (BID) | ORAL | Status: DC
  Administered 2022-09-19: 1 g via ORAL
  Filled 2022-09-19: qty 1

## 2022-09-19 NOTE — ED Notes (Signed)
Lab called to add on urine culture.  ?

## 2022-09-19 NOTE — H&P (Signed)
History and Physical    [name] [name] KDT:[id number] DOB: [birth year] DOA: 09/18/2022  PCP: [name], MD   Patient coming from: Home   Chief Complaint: General weakness, falls   HPI: [name] [name] is a 76 y.o. female with medical history significant for bipolar disorder, generalized anxiety disorder, tardive dyskinesia, CKD 3A, hypertension, and OSA on CPAP who presents emergency department with generalized weakness, falls, and poor appetite.  Patient reports months of generalized weakness and frequent falls which has worsened significantly over the past week or so.  She has history of syncopal episodes but there has not been loss of consciousness with the recent falls.  She denies abdominal pain, flank pain, or urinary symptoms. She denies any focal numbness or weakness.  She denies fever or chills.    ED Course: Upon arrival to the ED, patient is found to be afebrile and saturating well on room air with stable blood pressure.  EKG features sinus rhythm with PAC, RBBB, and LAFB.  Head CT is negative for acute intracranial abnormality and CTA head is a normal study.  Chemistry panel notable for creatinine of 1.45.  CBC demonstrates a macrocytosis without anemia.  BNP is slightly elevated and troponin is normal x2.  Patient was given a liter of LR and 1 g IV Rocephin in the ED.  Review of Systems:  All other systems reviewed and apart from HPI, are negative.  Past Medical History:  Diagnosis Date   Allergic rhinitis    Bipolar disorder ([place])    Cholelithiasis    seen on CAT 01/04/09   Chronic renal disease, stage III ([place])    9/98   Complication of anesthesia    versed and fentanyl did not work for colonoscopy   Diabetic peripheral neuropathy ([place]) 07/18/2020   DM (diabetes mellitus) ([place])    Edema    Gait abnormality 08/26/2019   HLD (hyperlipidemia)    HTN (hypertension)    Iron deficiency anemia    3/12    Lymphedema    tarda   Neuropathy    Obstructive sleep apnea on  CPAP    Osteoarthritis    Renal cyst    2.7 cm seen on CAT 01/04/09   Tardive dyskinesia     Past Surgical History:  Procedure Laterality Date   ABDOMINAL HYSTERECTOMY     BALLOON DILATION N/A 04/24/2020   Procedure: BALLOON DILATION;  Surgeon: Ronnette Juniper, MD;  Location: [facility];  Service: Gastroenterology;  Laterality: N/A;   BIOPSY  04/24/2020   Procedure: BIOPSY;  Surgeon: Ronnette Juniper, MD;  Location: [facility];  Service: Gastroenterology;;   BOTOX INJECTION N/A 04/24/2020   Procedure: POSSIBLE BOTOX INJECTION;  Surgeon: Ronnette Juniper, MD;  Location: [facility];  Service: Gastroenterology;  Laterality: N/A;   COLONOSCOPY  2009; 07/09/11   2009:normal (polyp was not adenoma); 2012:    DILATION AND CURETTAGE OF UTERUS     ESOPHAGEAL MANOMETRY N/A 05/31/2020   Procedure: ESOPHAGEAL MANOMETRY (EM);  Surgeon: Ronnette Juniper, MD;  Location: [facility];  Service: Gastroenterology;  Laterality: N/A;   ESOPHAGOGASTRODUODENOSCOPY  07/09/11   Erosive gastritis   ESOPHAGOGASTRODUODENOSCOPY (EGD) WITH PROPOFOL N/A 04/24/2020   Procedure: ESOPHAGOGASTRODUODENOSCOPY (EGD) WITH PROPOFOL;  Surgeon: Ronnette Juniper, MD;  Location: [facility];  Service: Gastroenterology;  Laterality: N/A;   FRACTURE SURGERY     rt fx upper arm   GIVENS CAPSULE STUDY  07/25/2011   normal small bowel   REIMPLANT URETER IN BLADDER  1996   TRIGGER FINGER  RELEASE Left 02/04/2013   Procedure: RELEASE TRIGGER FINGER/A-1 PULLEY LEFT RING FINGER;  Surgeon: Tennis Must, MD;  Location: [facility];  Service: Orthopedics;  Laterality: Left;    Social History:   reports that she quit smoking about 31 years ago. Her smoking use included cigarettes. She has never used smokeless tobacco. She reports that she does not currently use alcohol. She reports that she does not use drugs.  Allergies  Allergen Reactions   Latuda [Lurasidone] Other (See Comments)    Increased suicidal thoughts    Aripiprazole Other (See  Comments)    Tardive Dyskinesia Other reaction(s): tardive dyskinesia   Bacitracin-Polymyxin B     Other reaction(s): rash   Losartan Potassium     Other reaction(s): decline gfr   Neo-Bacit-Poly-Lidocaine    Quetiapine Other (See Comments)    Tardive Dyskinesia Other reaction(s): tardive dyskinesia    Family History  Problem Relation Age of Onset   Diabetes Mother    Hyperlipidemia Mother    Heart disease Father    Heart attack Father    Colon cancer Neg Hx      Prior to Admission medications   Medication Sig Start Date End Date Taking? Authorizing Provider  ALPRAZolam Duanne Moron) 1 MG tablet Take 1 tablet (1 mg total) by mouth 2 (two) times daily as needed for anxiety or sleep. 08/15/22  Yes Mozingo, Berdie Ogren, NP  amLODipine (NORVASC) 5 MG tablet Take 5 mg by mouth at bedtime.    Yes [provider]  buPROPion (WELLBUTRIN XL) 150 MG 24 hr tablet Take one tablet in the morning and one tablet at lunch. Patient taking differently: Take 150 mg by mouth daily. 06/20/22  Yes Mozingo, Berdie Ogren, NP  lamoTRIgine (LAMICTAL) 200 MG tablet Take 1 tablet (200 mg total) by mouth 2 (two) times daily. 05/23/22  Yes Mozingo, Berdie Ogren, NP  Multiple Vitamins-Minerals (MULTIVITAMIN WITH MINERALS) tablet Take 1 tablet by mouth daily.   Yes [provider]  sertraline (ZOLOFT) 100 MG tablet Take 1 tablet (100 mg total) by mouth 2 (two) times daily. 05/23/22  Yes Mozingo, Berdie Ogren, NP  simvastatin (ZOCOR) 40 MG tablet Take 40 mg by mouth at bedtime.    Yes [provider]  valbenazine (INGREZZA) 40 MG capsule Take 1 capsule (40 mg total) by mouth at bedtime. 05/23/22  Yes Mozingo, Berdie Ogren, NP    Physical Exam: Vitals:   09/19/22 0030 09/19/22 0045 09/19/22 0115 09/19/22 0137  BP: (!) 173/83 (!) 169/92  (!) 169/92  Pulse: (!) 51 (!) 51 (!) 57 61  Resp: 18   18  Temp:    98.3 F (36.8 C)  TempSrc:    Oral  SpO2: 100% 96% 98% 98%  Weight:       Height:         Constitutional: NAD, no pallor or diaphoresis   Eyes: PERTLA, lids and conjunctivae normal ENMT: Mucous membranes are moist. Posterior pharynx clear of any exudate or lesions.   Neck: supple, no masses  Respiratory: no wheezing, no crackles. No accessory muscle use.  Cardiovascular: S1 & S2 heard, regular rate and rhythm. No JVD.   Abdomen: No distension, no tenderness, soft. Bowel sounds active.  Musculoskeletal: no clubbing / cyanosis. No joint deformity upper and lower extremities.   Skin: no significant rashes, lesions, ulcers. Warm, dry, well-perfused. Neurologic: CN 2-12 grossly intact. Sensation to light touch intact, patellar DTR normal. Strength 5/5 in all 4 limbs. Alert and oriented.  Psychiatric: Calm. Cooperative.    Labs and Imaging on Admission: I have personally reviewed following labs and imaging studies  CBC: Recent Labs  Lab 09/18/22 1309  WBC 8.5  HGB 13.9  HCT 42.6  MCV 102.4*  PLT 144   Basic Metabolic Panel: Recent Labs  Lab 09/18/22 1309 09/18/22 2233  NA 139  --   K 4.2  --   CL 105  --   CO2 24  --   GLUCOSE 91  --   BUN 30*  --   CREATININE 1.45*  --   CALCIUM 9.3  --   MG  --  2.1   GFR: Estimated Creatinine Clearance: 36.6 mL/min (A) (by C-G formula based on SCr of 1.45 mg/dL (H)). Liver Function Tests: Recent Labs  Lab 09/18/22 1309  AST 21  ALT 15  ALKPHOS 53  BILITOT 0.6  PROT 7.1  ALBUMIN 3.9   No results for input(s): "LIPASE", "AMYLASE" in the last 168 hours. No results for input(s): "AMMONIA" in the last 168 hours. Coagulation Profile: No results for input(s): "INR", "PROTIME" in the last 168 hours. Cardiac Enzymes: No results for input(s): "CKTOTAL", "CKMB", "CKMBINDEX", "TROPONINI" in the last 168 hours. BNP (last 3 results) No results for input(s): "PROBNP" in the last 8760 hours. HbA1C: No results for input(s): "HGBA1C" in the last 72 hours. CBG: Recent Labs  Lab 09/18/22 1302  GLUCAP 87    Lipid Profile: No results for input(s): "CHOL", "HDL", "LDLCALC", "TRIG", "CHOLHDL", "LDLDIRECT" in the last 72 hours. Thyroid Function Tests: No results for input(s): "TSH", "T4TOTAL", "FREET4", "T3FREE", "THYROIDAB" in the last 72 hours. Anemia Panel: No results for input(s): "VITAMINB12", "FOLATE", "FERRITIN", "TIBC", "IRON", "RETICCTPCT" in the last 72 hours. Urine analysis:    Component Value Date/Time   COLORURINE YELLOW 09/18/2022 2157   APPEARANCEUR CLEAR 09/18/2022 2157   LABSPEC 1.016 09/18/2022 2157   PHURINE 5.0 09/18/2022 2157   GLUCOSEU NEGATIVE 09/18/2022 2157   HGBUR NEGATIVE 09/18/2022 2157   HGBUR 3+ 02/23/2008 1547   BILIRUBINUR NEGATIVE 09/18/2022 2157   [place] NEGATIVE 09/18/2022 2157   PROTEINUR NEGATIVE 09/18/2022 2157   UROBILINOGEN 1.0 02/23/2008 1547   NITRITE NEGATIVE 09/18/2022 2157   LEUKOCYTESUR LARGE (A) 09/18/2022 2157   Sepsis Labs: '@LABRCNTIP'$ (procalcitonin:4,lacticidven:4) )No results found for this or any previous visit (from the past 240 hour(s)).   Radiological Exams on Admission: CT ANGIO HEAD W OR WO CONTRAST  Result Date: 09/19/2022 CLINICAL DATA:  Initial evaluation for dizziness, weakness. EXAM: CT ANGIOGRAPHY HEAD TECHNIQUE: Multidetector CT imaging of the head was performed using the standard protocol during bolus administration of intravenous contrast. Multiplanar CT image reconstructions and MIPs were obtained to evaluate the vascular anatomy. RADIATION DOSE REDUCTION: This exam was performed according to the departmental dose-optimization program which includes automated exposure control, adjustment of the mA and/or kV according to patient size and/or use of iterative reconstruction technique. CONTRAST:  48m OMNIPAQUE IOHEXOL 350 MG/ML SOLN COMPARISON:  Prior study from 10/31/2020. FINDINGS: CT HEAD Brain: Cerebral volume within normal limits for age. Mild chronic microvascular ischemic disease. No acute intracranial hemorrhage. No  acute large vessel territory infarct. No mass lesion, mass effect or midline shift. No hydrocephalus or extra-axial fluid collection. Vascular: No abnormal hyperdense vessel. Skull: Scalp soft tissues and calvarium within normal limits. Prominent osteoarthritic changes noted about the TMJs bilaterally. Sinuses: Globes orbital soft tissues demonstrate no acute finding. Paranasal sinuses and mastoid air cells are largely clear. Other: None. CTA HEAD Anterior circulation: Both internal  carotid arteries widely patent to the termini without stenosis. Both A1 segments widely patent. Normal anterior communicating artery complex. Anterior cerebral arteries widely patent without stenosis. No M1 stenosis or occlusion. No proximal MCA branch occlusion or high-grade stenosis. Distal MCA branches perfused and symmetric. Posterior circulation: Both vertebral arteries patent without stenosis. Left vertebral artery dominant. Both PICA patent. Basilar patent to its distal aspect without stenosis. Superior cerebellar arteries patent bilaterally. Left PCA supplied via the basilar as well as a small left posterior communicating artery. Fetal type origin of the right PCA. Both PCAs patent to their distal aspects without stenosis. Venous sinuses: Patent allowing for timing the contrast bolus. Anatomic variants: As above.  No aneurysm. Review of the MIP images confirms the above findings. IMPRESSION: 1. Normal CTA of the head. No large vessel occlusion or other emergent finding. No hemodynamically significant or correctable stenosis. No aneurysm. 2. No other acute intracranial abnormality. 3. Mild chronic microvascular ischemic disease. Electronically Signed   By: Jeannine Boga M.D.   On: 09/19/2022 02:02    EKG: Independently reviewed. Sinus rhythm, PAC, RBBB, LAFB.   Assessment/Plan  1. General weakness; falls  - No acute findings on head CT and no focal deficit identified   - Check B12, folate, and TSH, continue  supportive care, consult PT     2. CKD IIIa  - SCr is 1.45 on admission, up from 1.18 two years ago  - Unclear if she has progressed to CKD 3b or if there has been acute worsening  - She was given a liter of IVF in ED  - Renally-dose medications, monitor    3. Hypertension  - Continue Norvasc    4. Bipolar disorder; GAD   - Continue Ingrezza, Lamictal, Zoloft, and as-needed Xanax    5. OSA  - CPAP qHS     DVT prophylaxis: sq heparin  Code Status: DNR  Level of Care: Level of care: Telemetry Family Communication: none present  Disposition Plan:  Patient is from: home  Anticipated d/c is to: TBD Anticipated d/c date is: 11/3 or 09/21/22  Patient currently: Pending additional lab workup, PT eval  Consults called: none  Admission status: Observation     Vianne Bulls, MD [facility]  09/19/2022, 2:35 AM

## 2022-09-19 NOTE — ED Provider Notes (Signed)
CT angio head without acute findings.  Patient has received IV fluids and Rocephin.  She still feels very unsteady on her feet.  In the bed, she has no focal arm or leg weakness Her thought process is clear and her speech is clear. However due to multiple falls and gait instability, patient will be admitted.  She may need an MRI to rule out occult stroke.  However at this time this is been ongoing for several days, therefore she is not in any window for intervention Patient reports she has bruised from recent falls, but no significant traumatic injuries are reported.  No obvious extremity trauma  Discussed with Dr. Myna Hidalgo for admission   Ripley Fraise, MD 09/19/22 [phone]

## 2022-09-19 NOTE — Progress Notes (Signed)
Pharmacy Note  Simvastatin 40 mg changed to atorvastatin 20 mg qday in pt on amlodipine to reduce risk of rhabdomyolysis  Eudelia Bunch, Pharm.D 09/19/2022 11:11 AM

## 2022-09-19 NOTE — Plan of Care (Signed)

## 2022-09-19 NOTE — Evaluation (Signed)
Physical Therapy Evaluation Patient Details Name: [name] [name] MRN: [id number] DOB: [birth year] Today's Date: 09/19/2022  History of Present Illness  [name] [name] is a 76 y.o. female with medical history significant for bipolar disorder, generalized anxiety disorder, tardive dyskinesia, CKD 3A, hypertension, lymphedema, and OSA on CPAP, syncopal episodes(on loop recorder) who presents emergency department 09/18/22 with generalized weakness, falls, and poor appetite.Head CT is negative for acute intracranial abnormality and CTA head is a normal study.  Clinical Impression  The patient  admitted for above medical issues.  Patient reports mod independent with rollator until 3 days prior with multiple falls, driving 3 weeks ago.  Patient has Tardive dyskinesia  , noted uncontrolled movements of LE's at rest and standing, and in trunk  when seated. Patient  required max  assistance to stand and take  a few  steps along stretcher.  Patient states spouse is not able to provide physical support. Patient  has been to SNF for rehab in past and is agreeable. Pt admitted with above diagnosis.  Pt currently with functional limitations due to the deficits listed below (see PT Problem List). Pt will benefit from skilled PT to increase their independence and safety with mobility to allow discharge to the venue listed below.        Recommendations for follow up therapy are one component of a multi-disciplinary discharge planning process, led by the attending physician.  Recommendations may be updated based on patient status, additional functional criteria and insurance authorization.  Follow Up Recommendations Skilled nursing-short term rehab (<3 hours/day)      Assistance Recommended at Discharge Frequent or constant Supervision/Assistance  Patient can return home with the following  A lot of help with walking and/or transfers;A little help with bathing/dressing/bathroom;Assistance with  cooking/housework;Assist for transportation;Help with stairs or ramp for entrance    Equipment Recommendations None recommended by PT  Recommendations for Other Services  OT consult    Functional Status Assessment Patient has had a recent decline in their functional status and demonstrates the ability to make significant improvements in function in a reasonable and predictable amount of time.     Precautions / Restrictions Precautions Precautions: Fall      Mobility  Bed Mobility Overal bed mobility: Needs Assistance Bed Mobility: Supine to Sit, Sit to Supine     Supine to sit: Min assist Sit to supine: Mod assist   General bed mobility comments: assist legs onto stretcher, legs flailing as placing onto stretcher    Transfers Overall transfer level: Needs assistance Equipment used: Rolling walker (2 wheels) Transfers: Sit to/from Stand Sit to Stand: Mod assist           General transfer comment: very close guard, mod assist, noted  feet moving, decreased control to side step x 2, sat to rest, then stood and took 2 more side steps, mod assist for balance, decreased control of LE's to take  steps.    Ambulation/Gait                  Stairs            Wheelchair Mobility    Modified Rankin (Stroke Patients Only)       Balance Overall balance assessment: Needs assistance, History of Falls Sitting-balance support: Feet supported, Bilateral upper extremity supported Sitting balance-Leahy Scale: Poor Sitting balance - Comments: tends to lean backwards   Standing balance support: Bilateral upper extremity supported, During functional activity, Reliant on assistive device for balance Standing balance-Leahy  Scale: Poor Standing balance comment: realiant on support                             Pertinent Vitals/Pain Pain Assessment Pain Assessment: 0-10 Faces Pain Scale: Hurts little more Pain Location: L thigh, noted bruising on both legs  and knees    Home Living Family/patient expects to be discharged to:: Private residence Living Arrangements: Spouse/significant other Available Help at Discharge: Family Type of Home: House Home Access: Stairs to enter Entrance Stairs-Rails: Psychiatric nurse of Steps: 3 x2   Home Layout: Two level;Able to live on main level with bedroom/bathroom Home Equipment: Rollator (4 wheels) Additional Comments: has LE pumps for both legs for lymphedema    Prior Function Prior Level of Function : Needs assist;History of Falls (last six months)       Physical Assist : Mobility (physical)     Mobility Comments: was driving a little until 3 weeks ago, ambulatory w/Rollator until 3 days ago, unable to ambulate  leading to ED visit ADLs Comments: IADLs until 3 days PTA     Hand Dominance   Dominant Hand: Right    Extremity/Trunk Assessment   Upper Extremity Assessment Upper Extremity Assessment: Overall WFL for tasks assessed    Lower Extremity Assessment Lower Extremity Assessment: RLE deficits/detail;LLE deficits/detail RLE Deficits / Details: noted frequent uncontrolled movememts, strength grossly 4/5 RLE Sensation: history of peripheral neuropathy LLE Deficits / Details: same as left LLE Sensation: history of peripheral neuropathy    Cervical / Trunk Assessment Cervical / Trunk Assessment: Normal  Communication   Communication: No difficulties  Cognition Arousal/Alertness: Awake/alert Behavior During Therapy: WFL for tasks assessed/performed Overall Cognitive Status: Within Functional Limits for tasks assessed                                          General Comments      Exercises     Assessment/Plan    PT Assessment Patient needs continued PT services  PT Problem List Decreased activity tolerance;Decreased mobility;Decreased safety awareness;Decreased balance;Impaired sensation;Decreased knowledge of precautions;Decreased  knowledge of use of DME       PT Treatment Interventions DME instruction;Therapeutic activities;Gait training;Functional mobility training;Therapeutic exercise;Patient/family education    PT Goals (Current goals can be found in the Care Plan section)  Acute Rehab PT Goals Patient Stated Goal: to be able to walk again PT Goal Formulation: With patient Time For Goal Achievement: 10/03/22 Potential to Achieve Goals: Good    Frequency Min 2X/week     Co-evaluation               AM-PAC PT "6 Clicks" Mobility  Outcome Measure Help needed turning from your back to your side while in a flat bed without using bedrails?: A Little Help needed moving from lying on your back to sitting on the side of a flat bed without using bedrails?: A Lot Help needed moving to and from a bed to a chair (including a wheelchair)?: A Lot Help needed standing up from a chair using your arms (e.g., wheelchair or bedside chair)?: A Lot Help needed to walk in hospital room?: Total Help needed climbing 3-5 steps with a railing? : Total 6 Click Score: 11    End of Session Equipment Utilized During Treatment: Gait belt Activity Tolerance: Patient tolerated treatment well Patient left: in bed;with call  bell/phone within reach Nurse Communication: Mobility status PT Visit Diagnosis: Unsteadiness on feet (R26.81);Repeated falls (R29.6);Other symptoms and signs involving the nervous system (R29.898);Difficulty in walking, not elsewhere classified (R26.2)    Time: 3825-0539 PT Time Calculation (min) (ACUTE ONLY): 28 min   Charges:   PT Evaluation $PT Eval Low Complexity: 1 Low PT Treatments $Therapeutic Activity: 8-22 mins    Tresa Endo PT Acute Rehabilitation Services Office [phone] Weekend [phone]   Claretha Cooper 09/19/2022, 11:40 AM

## 2022-09-19 NOTE — Progress Notes (Signed)
Patient admitted earlier this am.  [name] [name] is a 76 y.o. female with medical history significant for bipolar disorder, generalized anxiety disorder, tardive dyskinesia, CKD 3A, hypertension, and OSA on CPAP who presents emergency department with generalized weakness, falls, and poor appetite.   So far work up has been negative for infection.  Pt reports worsening falls in the last few days.  Get MRi brain for further eval.    Hosie Poisson MD.

## 2022-09-19 NOTE — Progress Notes (Signed)
Pt has arrived to ED with weakness and increased falls.  Pt is alert and oriented.  Room orientation completed with call bell placed at bedside.  Care continues.

## 2022-09-20 DIAGNOSIS — F411 Generalized anxiety disorder: Secondary | ICD-10-CM | POA: Diagnosis not present

## 2022-09-20 DIAGNOSIS — R296 Repeated falls: Secondary | ICD-10-CM

## 2022-09-20 DIAGNOSIS — R531 Weakness: Secondary | ICD-10-CM | POA: Diagnosis not present

## 2022-09-20 DIAGNOSIS — I10 Essential (primary) hypertension: Secondary | ICD-10-CM

## 2022-09-20 DIAGNOSIS — F319 Bipolar disorder, unspecified: Secondary | ICD-10-CM | POA: Diagnosis not present

## 2022-09-20 LAB — CBC
HCT: 38.5 % (ref 36.0–46.0)
Hemoglobin: 12.8 g/dL (ref 12.0–15.0)
MCH: 33.7 pg (ref 26.0–34.0)
MCHC: 33.2 g/dL (ref 30.0–36.0)
MCV: 101.3 fL — ABNORMAL HIGH (ref 80.0–100.0)
Platelets: 147 10*3/uL — ABNORMAL LOW (ref 150–400)
RBC: 3.8 MIL/uL — ABNORMAL LOW (ref 3.87–5.11)
RDW: 12.5 % (ref 11.5–15.5)
WBC: 5.7 10*3/uL (ref 4.0–10.5)
nRBC: 0 % (ref 0.0–0.2)

## 2022-09-20 LAB — BASIC METABOLIC PANEL
Anion gap: 9 (ref 5–15)
BUN: 21 mg/dL (ref 8–23)
CO2: 25 mmol/L (ref 22–32)
Calcium: 8.9 mg/dL (ref 8.9–10.3)
Chloride: 104 mmol/L (ref 98–111)
Creatinine, Ser: 1.4 mg/dL — ABNORMAL HIGH (ref 0.44–1.00)
GFR, Estimated: 39 mL/min — ABNORMAL LOW (ref >60)
Glucose, Bld: 99 mg/dL (ref 70–99)
Potassium: 3.9 mmol/L (ref 3.5–5.1)
Sodium: 138 mmol/L (ref 135–145)

## 2022-09-20 LAB — URINE CULTURE

## 2022-09-20 MED ORDER — AMLODIPINE BESYLATE 5 MG PO TABS
5.0000 mg | ORAL_TABLET | Freq: Every day | ORAL | Status: DC
  Administered 2022-09-20 – 2022-09-25 (×6): 5 mg via ORAL
  Filled 2022-09-20 (×6): qty 1

## 2022-09-20 MED ORDER — COLESTIPOL HCL 1 G PO TABS
2.0000 g | ORAL_TABLET | Freq: Two times a day (BID) | ORAL | Status: DC
  Administered 2022-09-20 (×2): 2 g via ORAL
  Administered 2022-09-21 (×2): 1 g via ORAL

## 2022-09-20 NOTE — Progress Notes (Signed)
Physical Therapy Treatment Patient Details Name: ELNORA QUIZON MRN: [id number] DOB: 1946-03-11 Today's Date: 09/20/2022   History of Present Illness ZAKARIAH DEJARNETTE is a 76 y.o. female with medical history significant for bipolar disorder, generalized anxiety disorder, tardive dyskinesia, CKD 3A, hypertension, lymphedema, and OSA on CPAP, syncopal episodes(on loop recorder) who presents emergency department 09/18/22 with generalized weakness, falls, and poor appetite.Head CT is negative for acute intracranial abnormality and CTA head is a normal study.    PT Comments    AxO x 3 very pleasant Lady.  Feeling "much better".  General Gait Details: MUCH improved gait this session.  Still present with mild tremors but much imroved functional distance. General transfer comment: 25% VC's on proper hand placement as well as increased time.  Also assisted with a BSC transfer.  Pt did well. General bed mobility comments: Mod Assist B LE up onto bed due to "heaviness" Lymphedema.   Pt will need ST Rehab at SNF to address mobility and functional decline prior to safely returning home.    Recommendations for follow up therapy are one component of a multi-disciplinary discharge planning process, led by the attending physician.  Recommendations may be updated based on patient status, additional functional criteria and insurance authorization.  Follow Up Recommendations  Skilled nursing-short term rehab (<3 hours/day)     Assistance Recommended at Discharge Frequent or constant Supervision/Assistance  Patient can return home with the following A lot of help with walking and/or transfers;A little help with bathing/dressing/bathroom;Assistance with cooking/housework;Assist for transportation;Help with stairs or ramp for entrance   Equipment Recommendations  None recommended by PT    Recommendations for Other Services       Precautions / Restrictions Precautions Precautions: Fall Precaution Comments:  Tartive Dyskinesia Restrictions Weight Bearing Restrictions: No     Mobility  Bed Mobility Overal bed mobility: Needs Assistance Bed Mobility: Sit to Supine       Sit to supine: Mod assist   General bed mobility comments: Mod Assist B LE up onto bed due to "heaviness" Lymphodema    Transfers Overall transfer level: Needs assistance Equipment used: Rolling walker (2 wheels) Transfers: Sit to/from Stand Sit to Stand: Min assist           General transfer comment: 25% VC's on proper hand placement as well as increased time.  Also assisted with a BSC transfer.  Pt did well.    Ambulation/Gait Ambulation/Gait assistance: Min guard, Min assist Gait Distance (Feet): 145 Feet Assistive device: Rolling walker (2 wheels) Gait Pattern/deviations: Step-through pattern Gait velocity: decreased     General Gait Details: MUCH improved gait this session.  Still present with mild tremors but much imroved functional distance.   Stairs             Wheelchair Mobility    Modified Rankin (Stroke Patients Only)       Balance                                            Cognition Arousal/Alertness: Awake/alert Behavior During Therapy: WFL for tasks assessed/performed Overall Cognitive Status: Within Functional Limits for tasks assessed                                 General Comments: AxO x 3 pleasant  Exercises      General Comments        Pertinent Vitals/Pain Pain Assessment Pain Assessment: No/denies pain    Home Living Family/patient expects to be discharged to:: Private residence Living Arrangements: Spouse/significant other Available Help at Discharge: Family Type of Home: House Home Access: Stairs to enter Entrance Stairs-Rails: Psychiatric nurse of Steps: 3 x2   Home Layout: Two level;Able to live on main level with bedroom/bathroom Home Equipment: Rollator (4 wheels) Additional Comments:  has LE pumps for both legs for lymphedema    Prior Function            PT Goals (current goals can now be found in the care plan section) Progress towards PT goals: Progressing toward goals    Frequency    Min 2X/week      PT Plan Current plan remains appropriate    Co-evaluation              AM-PAC PT "6 Clicks" Mobility   Outcome Measure  Help needed turning from your back to your side while in a flat bed without using bedrails?: A Little Help needed moving from lying on your back to sitting on the side of a flat bed without using bedrails?: A Little Help needed moving to and from a bed to a chair (including a wheelchair)?: A Little Help needed standing up from a chair using your arms (e.g., wheelchair or bedside chair)?: A Little Help needed to walk in hospital room?: A Little Help needed climbing 3-5 steps with a railing? : A Lot 6 Click Score: 17    End of Session Equipment Utilized During Treatment: Gait belt Activity Tolerance: Patient tolerated treatment well Patient left: in bed;with call bell/phone within reach Nurse Communication: Mobility status PT Visit Diagnosis: Unsteadiness on feet (R26.81);Repeated falls (R29.6);Other symptoms and signs involving the nervous system (R29.898);Difficulty in walking, not elsewhere classified (R26.2)     Time: 1445-1500 PT Time Calculation (min) (ACUTE ONLY): 15 min  Charges:  $Gait Training: 8-22 mins                     Rica Koyanagi  PTA [place] [facility] M-F          [phone] Weekend pager [phone]

## 2022-09-20 NOTE — Evaluation (Addendum)
Occupational Therapy Evaluation Patient Details Name: [name] [name] MRN: [id number] DOB: [birth year] Today's Date: 09/20/2022   History of Present Illness [name] [name] is a 76 y.o. female with medical history significant for bipolar disorder, generalized anxiety disorder, tardive dyskinesia, CKD 3A, hypertension, lymphedema, and OSA on CPAP, syncopal episodes(on loop recorder) who presents emergency department 09/18/22 with generalized weakness, falls, and poor appetite.Head CT is negative for acute intracranial abnormality and CTA head is a normal study.   Clinical Impression   [name] [name] is a 76 year old woman admitted to hospital with above medical history and presents with generalized weakness, decreased activity tolerance, impaired balance, decreased sensation and coordination in LEs. Patient needing min assist to ambulate with walker and increased assistance with ADLs including mod assist for LB ADLs and toileting. Patient was independent 4 days ago with use of rollator and reports frequent falls. Patient will benefit from skilled OT services while in hospital to improve deficits and learn compensatory strategies as needed in order to return to PLOF.  Recommend short term rehab to maximize strength and functional abilities prior to return home.        Recommendations for follow up therapy are one component of a multi-disciplinary discharge planning process, led by the attending physician.  Recommendations may be updated based on patient status, additional functional criteria and insurance authorization.   Follow Up Recommendations  Skilled nursing-short term rehab (<3 hours/day)    Assistance Recommended at Discharge Frequent or constant Supervision/Assistance  Patient can return home with the following A little help with walking and/or transfers;A lot of help with bathing/dressing/bathroom;Assistance with cooking/housework;Help with stairs or ramp for entrance    Functional  Status Assessment  Patient has had a recent decline in their functional status and demonstrates the ability to make significant improvements in function in a reasonable and predictable amount of time.  Equipment Recommendations  Other (comment) (Defer)    Recommendations for Other Services       Precautions / Restrictions Precautions Precautions: Fall Restrictions Weight Bearing Restrictions: No      Mobility Bed Mobility Overal bed mobility: Needs Assistance Bed Mobility: Supine to Sit     Supine to sit: Supervision     General bed mobility comments: supervision to transfer to edge of bed with use of bed rail    Transfers Overall transfer level: Needs assistance Equipment used: Rolling walker (2 wheels) Transfers: Sit to/from Stand Sit to Stand: Min assist           General transfer comment: Min assist for standing and ambulation with walker.      Balance   Sitting-balance support: No upper extremity supported, Feet supported Sitting balance-Leahy Scale: Good     Standing balance support: Bilateral upper extremity supported, During functional activity, Reliant on assistive device for balance Standing balance-Leahy Scale: Poor                             ADL either performed or assessed with clinical judgement   ADL Overall ADL's : Needs assistance/impaired Eating/Feeding: Set up;Sitting   Grooming: Set up;Sitting   Upper Body Bathing: Set up;Sitting   Lower Body Bathing: Moderate assistance;Sit to/from stand   Upper Body Dressing : Set up;Sitting   Lower Body Dressing: Moderate assistance;Sit to/from stand Lower Body Dressing Details (indicate cue type and reason): able to don socks in seated position, needing assistance for some LB clothing management due to hands needing to  be on walker Toilet Transfer: Minimal assistance;+2 for safety/equipment;Regular Toilet;Rolling walker (2 wheels);Grab bars Toilet Transfer Details (indicate cue  type and reason): ambuated to bathroom with walker, min assist for toilet transfer and cues for hand placement Toileting- Clothing Manipulation and Hygiene: Minimal assistance;Sit to/from stand Toileting - Clothing Manipulation Details (indicate cue type and reason): min assist for clothing management     Functional mobility during ADLs: Minimal assistance;Rolling walker (2 wheels)       Vision Patient Visual Report: No change from baseline       Perception     Praxis      Pertinent Vitals/Pain Pain Assessment Pain Assessment: No/denies pain     Hand Dominance Right   Extremity/Trunk Assessment Upper Extremity Assessment Upper Extremity Assessment: Overall WFL for tasks assessed   Lower Extremity Assessment Lower Extremity Assessment: Defer to PT evaluation RLE Sensation: history of peripheral neuropathy LLE Sensation: history of peripheral neuropathy   Cervical / Trunk Assessment Cervical / Trunk Assessment: Normal   Communication Communication Communication: No difficulties   Cognition Arousal/Alertness: Awake/alert Behavior During Therapy: WFL for tasks assessed/performed Overall Cognitive Status: Within Functional Limits for tasks assessed                                       General Comments       Exercises     Shoulder Instructions      Home Living Family/patient expects to be discharged to:: Private residence Living Arrangements: Spouse/significant other Available Help at Discharge: Family Type of Home: House Home Access: Stairs to enter [employer] of Steps: 3 x2 Entrance Stairs-Rails: Right;Left Home Layout: Two level;Able to live on main level with bedroom/bathroom     Bathroom Shower/Tub: Occupational psychologist: Handicapped height     Home Equipment: Rollator (4 wheels)   Additional Comments: has LE pumps for both legs for lymphedema      Prior Functioning/Environment Prior Level of Function :  Needs assist;History of Falls (last six months)       Physical Assist : Mobility (physical)     Mobility Comments: was driving a little until 3 weeks ago, ambulatory w/Rollator until 3 days ago, unable to ambulate  leading to ED visit ADLs Comments: IADLs until 3 days PTA        OT Problem List: Decreased strength;Decreased activity tolerance;Impaired balance (sitting and/or standing);Decreased coordination;Decreased knowledge of use of DME or AE;Obesity;Increased edema;Impaired sensation      OT Treatment/Interventions: Self-care/ADL training;Therapeutic exercise;DME and/or AE instruction;Therapeutic activities;Balance training;Patient/family education    OT Goals(Current goals can be found in the care plan section) Acute Rehab OT Goals Patient Stated Goal: to walk safely OT Goal Formulation: With patient Time For Goal Achievement: 10/04/22 Potential to Achieve Goals: Good  OT Frequency: Min 2X/week    Co-evaluation              AM-PAC OT "6 Clicks" Daily Activity     Outcome Measure Help from another person eating meals?: None Help from another person taking care of personal grooming?: A Little Help from another person toileting, which includes using toliet, bedpan, or urinal?: A Little Help from another person bathing (including washing, rinsing, drying)?: A Lot Help from another person to put on and taking off regular upper body clothing?: A Little Help from another person to put on and taking off regular lower body clothing?: A Lot 6 Click  Score: 17   End of Session Equipment Utilized During Treatment: Rolling walker (2 wheels) Nurse Communication: Mobility status  Activity Tolerance: Patient tolerated treatment well Patient left: in chair;with call bell/phone within reach;with chair alarm set  OT Visit Diagnosis: Other symptoms and signs involving the nervous system (R29.898);Muscle weakness (generalized) (M62.81)                Time: 1010-1032 OT Time  Calculation (min): 22 min Charges:  OT General Charges $OT Visit: 1 Visit OT Evaluation $OT Eval Low Complexity: 1 Low  Gustavo Lah, OTR/L [place]  Office [phone]   Lenward Chancellor 09/20/2022, 12:57 PM

## 2022-09-20 NOTE — TOC Initial Note (Signed)
Transition of Care [facility]) - Initial/Assessment Note    Patient Details  Name: [name] [name] MRN: [id number] Date of Birth: [birth year]  [facility] CM/SW Contact:    Lennart Pall, LCSW Phone Number: 09/20/2022, 4:03 PM  Clinical Narrative:                 Met with pt today to introduce TOC/CSW role with dc planning.  Pt is very pleasant and aware that therapy has made recommendation for SNF rehab prior to return home.  She is agreeable with this plan and has no facility preferences.  Will begin SNF placement process.  Expected Discharge Plan: Skilled Nursing Facility Barriers to Discharge: Continued Medical Work up, [place] (PASRR), SNF Pending bed offer   Patient Goals and CMS Choice Patient states their goals for this hospitalization and ongoing recovery are:: return home following SNF rehab   Choice offered to / list presented to : Spouse, Patient  Expected Discharge Plan and Services Expected Discharge Plan: [place] In-house Referral: Clinical Social Work   Post Acute Care Choice: [place] Living arrangements for the past 2 months: [place]                 DME Arranged: N/A DME Agency: NA                  Prior Living Arrangements/Services Living arrangements for the past 2 months: [place] Lives with:: Spouse Patient language and need for interpreter reviewed:: Yes Do you feel safe going back to the place where you live?: Yes      Need for Family Participation in Patient Care: Yes (Comment) Care giver support system in place?: Yes (comment)   Criminal Activity/Legal Involvement Pertinent to Current Situation/Hospitalization: No - Comment as needed  Activities of Daily Living Home Assistive Devices/Equipment: Walker (specify type) ADL Screening (condition at time of admission) Patient's cognitive ability adequate to safely complete daily activities?: Yes Is the patient deaf or  have difficulty hearing?: No Does the patient have difficulty seeing, even when wearing glasses/contacts?: No Does the patient have difficulty concentrating, remembering, or making decisions?: No Patient able to express need for assistance with ADLs?: Yes Does the patient have difficulty dressing or bathing?: Yes Independently performs ADLs?: No Communication: Independent Dressing (OT): Needs assistance Is this a change from baseline?: Change from baseline, expected to last >3 days Grooming: Needs assistance Feeding: Needs assistance Is this a change from baseline?: Change from baseline, expected to last >3 days Bathing: Needs assistance Is this a change from baseline?: Change from baseline, expected to last >3 days Toileting: Needs assistance Is this a change from baseline?: Change from baseline, expected to last >3days In/Out Bed: Needs assistance Is this a change from baseline?: Pre-admission baseline Walks in Home: Needs assistance Is this a change from baseline?: Change from baseline, expected to last >3 days Does the patient have difficulty walking or climbing stairs?: Yes Weakness of Legs: Both Weakness of Arms/Hands: None  Permission Sought/Granted Permission sought to share information with : Family Supports Permission granted to share information with : Yes, Verbal Permission Granted  Share Information with NAME: Nancylee Gaines     Permission granted to share info w Relationship: spouse  Permission granted to share info w Contact Information: [phone]  Emotional Assessment Appearance:: Appears stated age Attitude/Demeanor/Rapport: Gracious Affect (typically observed): Accepting Orientation: : Oriented to Self, Oriented to Place, Oriented to  Time, Oriented to Situation Alcohol /  Substance Use: Not Applicable Psych Involvement: No (comment)  Admission diagnosis:  Weakness generalized [R53.1] Gait instability [R26.81] General weakness [R53.1] AKI (acute kidney  injury) ([place]) [N17.9] Repeated falls [R29.6] Recurrent falls [R29.6] Patient Active Problem List   Diagnosis Date Noted   General weakness 09/19/2022   Bipolar disorder ([place]) 09/19/2022   Stage 3b chronic kidney disease (CKD) ([place]) 09/19/2022   AKI (acute kidney injury) ([place]) 09/19/2022   GAD (generalized anxiety disorder) 09/19/2022   Recurrent falls 09/19/2022   NSVT (nonsustained ventricular tachycardia) ([place]) 02/24/2021   Bifascicular block 02/24/2021   Fall at home 10/31/2020   Tardive dyskinesia 07/18/2020   Diabetic peripheral neuropathy ([place]) 07/18/2020   Gait abnormality 08/26/2019   Unspecified gastritis and gastroduodenitis without mention of hemorrhage 07/09/2011   Personal history of failed moderate sedation 05/20/2011   ACUTE CYSTITIS 02/23/2008   HYPERLIPIDEMIA 10/30/2007   DIABETES MELLITUS, TYPE II 07/08/2007   Iron deficiency anemia, unspecified 07/08/2007   DEPRESSION 07/08/2007   Essential hypertension 07/08/2007   PCP:  [name], MD Pharmacy:   [id number] - [place], [address] [place] [address] Phone: [phone] Fax: [phone]     Social Determinants of Health (SDOH) Interventions    Readmission Risk Interventions     No data to display

## 2022-09-20 NOTE — Progress Notes (Signed)
Pt said she would like a break from her cpap tonight. She said she is not consistent at home either. Machine remained bedside.

## 2022-09-20 NOTE — Progress Notes (Signed)
[facility]                                                                               [name] [name], is a 76 y.o. female, DOB - 07/28/1946, YWV:[id number] Admit date - 09/18/2022    Outpatient Primary MD for the patient is [name] Manes, MD  LOS - 1  days    Brief summary   [name] [name] is a 76 y.o. female with medical history significant for bipolar disorder, generalized anxiety disorder, tardive dyskinesia, CKD 3A, hypertension, and OSA on CPAP who presents emergency department with generalized weakness, falls, and poor appetite.    So far work up has been negative for infection.  Pt reports worsening falls in the last few days. NO SYNCOPE. She had extensive work up with loop recorder and no arrhythmias found.  She underwent CT angio of the head and MRI brain, negative for stroke.     Assessment & Plan    Assessment and Plan:   Generalized weakness and recurrent falls:  Unclear etiology. Could be secondary to her tardive dyskinesia, peripheral neuropathy or secondary to her meds  Therapy eval recommending SNF.  TSH wnl. Vit b12 and folate wnl.  She was seen by neurology and underwent EEG in the past.    Bipolar disorder.  Resume home  meds.    Uncontrolled Hypertension:  Added norvasc.  Recheck BP this afternoon.     Generalized anxiety disorder;  Resume xanax.    Stage 3a CKD Creatinine at baseline.     OSA on CPAP.    Estimated body mass index is 37.88 kg/m as calculated from the following:   Height as of this encounter: '5\' 3"'$  (1.6 m).   Weight as of this encounter: 97 kg.  Code Status: DNR DVT Prophylaxis:  heparin injection 5,000 Units Start: 09/19/22 0600   Level of Care: Level of care: Med-Surg Family Communication: none at bedside.   Disposition Plan:     Remains inpatient appropriate:  SNF placement.   Procedures:  MRI brain   Consultants:   None.   Antimicrobials:   Anti-infectives (From admission, onward)     Start     Dose/Rate Route Frequency Ordered Stop   09/19/22 0000  cefTRIAXone (ROCEPHIN) 1 g in sodium chloride 0.9 % 100 mL IVPB        1 g 200 mL/hr over 30 Minutes Intravenous  Once 09/18/22 2346 09/19/22 0219        Medications  Scheduled Meds:  amLODipine  5 mg Oral QHS   atorvastatin  20 mg Oral QHS   buPROPion  150 mg Oral Daily   colestipol  2 g Oral BID   heparin  5,000 Units Subcutaneous Q8H   lamoTRIgine  200 mg Oral BID   sertraline  100 mg Oral BID   sodium chloride flush  3 mL Intravenous Q12H   valbenazine  40 mg Oral QHS   Continuous Infusions: PRN Meds:.acetaminophen **OR** acetaminophen, ALPRAZolam, ondansetron **OR** ondansetron (ZOFRAN) IV, senna-docusate    Subjective:   [name] was seen and examined today.  No new complaints.   Objective:   Vitals:   09/19/22 1812 09/19/22  2302 09/20/22 0233 09/20/22 0534  BP: (!) 154/98 (!) 174/98 (!) 169/96 (!) 169/90  Pulse: 65 (!) 59 64 61  Resp: '18 17 17 17  '$ Temp: 98.4 F (36.9 C) 98.4 F (36.9 C) 98.4 F (36.9 C) 98.3 F (36.8 C)  TempSrc: Oral Oral  Oral  SpO2: 97% 94% 100% 97%  Weight:      Height:        Intake/Output Summary (Last 24 hours) at 09/20/2022 1125 Last data filed at 09/20/2022 1000 Gross per 24 hour  Intake 600 ml  Output 1100 ml  Net -500 ml   Filed Weights   09/18/22 2240  Weight: 97 kg     Exam General: Alert and oriented x 3, NAD Cardiovascular: S1 S2 auscultated, no murmurs, RRR Respiratory: Clear to auscultation bilaterally, no wheezing, rales or rhonchi Gastrointestinal: Soft, nontender, nondistended, + bowel sounds Ext: no pedal edema bilaterally Neuro: AAOx3, Cr N's II- XII. Strength 5/5 upper and lower extremities bilaterally Skin: No rashes Psych: Normal affect and demeanor, alert and oriented x3    Data Reviewed:  I have personally reviewed following labs and imaging studies   CBC Lab Results  Component Value Date   WBC 5.7 09/20/2022   RBC  3.80 (L) 09/20/2022   HGB 12.8 09/20/2022   HCT 38.5 09/20/2022   MCV 101.3 (H) 09/20/2022   MCH 33.7 09/20/2022   PLT 147 (L) 09/20/2022   MCHC 33.2 09/20/2022   RDW 12.5 09/20/2022   LYMPHSABS 0.7 10/31/2020   MONOABS 0.5 10/31/2020   EOSABS 0.2 10/31/2020   BASOSABS 0.0 71/24/5809     Last metabolic panel Lab Results  Component Value Date   NA 138 09/20/2022   K 3.9 09/20/2022   CL 104 09/20/2022   CO2 25 09/20/2022   BUN 21 09/20/2022   CREATININE 1.40 (H) 09/20/2022   GLUCOSE 99 09/20/2022   GFRNONAA 39 (L) 09/20/2022   GFRAA 47 (L) 12/06/2019   CALCIUM 8.9 09/20/2022   PROT 7.1 09/18/2022   ALBUMIN 3.9 09/18/2022   LABGLOB 2.9 01/05/2020   BILITOT 0.6 09/18/2022   ALKPHOS 53 09/18/2022   AST 21 09/18/2022   ALT 15 09/18/2022   ANIONGAP 9 09/20/2022    CBG (last 3)  Recent Labs    09/18/22 1302  GLUCAP 87      Coagulation Profile: No results for input(s): "INR", "PROTIME" in the last 168 hours.   Radiology Studies: MR BRAIN WO CONTRAST  Result Date: 09/19/2022 CLINICAL DATA:  Syncope/presyncope, cerebrovascular cause suspected Recurrent falls, EXAM: MRI HEAD WITHOUT CONTRAST TECHNIQUE: Multiplanar, multiecho pulse sequences of the brain and surrounding structures were obtained without intravenous contrast. COMPARISON:  MRI Head 10/31/2020. FINDINGS: Brain: No acute infarction, hemorrhage, hydrocephalus, extra-axial collection or mass lesion. Mild for age scattered T2/FLAIR hyperintensities in the white matter, compatible with chronic microvascular ischemic disease. Vascular: Major arterial flow voids are maintained at the skull base. Skull and upper cervical spine: Normal marrow signal. Sinuses/Orbits: Negative. Other: No sizable mastoid effusions. IMPRESSION: No evidence of acute intracranial abnormality. Electronically Signed   By: Margaretha Sheffield M.D.   On: 09/19/2022 16:03   CT ANGIO HEAD W OR WO CONTRAST  Result Date: 09/19/2022 CLINICAL DATA:   Initial evaluation for dizziness, weakness. EXAM: CT ANGIOGRAPHY HEAD TECHNIQUE: Multidetector CT imaging of the head was performed using the standard protocol during bolus administration of intravenous contrast. Multiplanar CT image reconstructions and MIPs were obtained to evaluate the vascular anatomy. RADIATION DOSE REDUCTION: This exam  was performed according to the departmental dose-optimization program which includes automated exposure control, adjustment of the mA and/or kV according to patient size and/or use of iterative reconstruction technique. CONTRAST:  85m OMNIPAQUE IOHEXOL 350 MG/ML SOLN COMPARISON:  Prior study from 10/31/2020. FINDINGS: CT HEAD Brain: Cerebral volume within normal limits for age. Mild chronic microvascular ischemic disease. No acute intracranial hemorrhage. No acute large vessel territory infarct. No mass lesion, mass effect or midline shift. No hydrocephalus or extra-axial fluid collection. Vascular: No abnormal hyperdense vessel. Skull: Scalp soft tissues and calvarium within normal limits. Prominent osteoarthritic changes noted about the TMJs bilaterally. Sinuses: Globes orbital soft tissues demonstrate no acute finding. Paranasal sinuses and mastoid air cells are largely clear. Other: None. CTA HEAD Anterior circulation: Both internal carotid arteries widely patent to the termini without stenosis. Both A1 segments widely patent. Normal anterior communicating artery complex. Anterior cerebral arteries widely patent without stenosis. No M1 stenosis or occlusion. No proximal MCA branch occlusion or high-grade stenosis. Distal MCA branches perfused and symmetric. Posterior circulation: Both vertebral arteries patent without stenosis. Left vertebral artery dominant. Both PICA patent. Basilar patent to its distal aspect without stenosis. Superior cerebellar arteries patent bilaterally. Left PCA supplied via the basilar as well as a small left posterior communicating artery. Fetal  type origin of the right PCA. Both PCAs patent to their distal aspects without stenosis. Venous sinuses: Patent allowing for timing the contrast bolus. Anatomic variants: As above.  No aneurysm. Review of the MIP images confirms the above findings. IMPRESSION: 1. Normal CTA of the head. No large vessel occlusion or other emergent finding. No hemodynamically significant or correctable stenosis. No aneurysm. 2. No other acute intracranial abnormality. 3. Mild chronic microvascular ischemic disease. Electronically Signed   By: BJeannine BogaM.D.   On: 09/19/2022 02:02       VHosie PoissonM.D. [facility] 09/20/2022, 11:25 AM  Available via Epic secure chat 7am-7pm After 7 pm, please refer to night coverage provider listed on amion.

## 2022-09-20 NOTE — NC FL2 (Signed)
[facility] LEVEL OF CARE SCREENING TOOL     IDENTIFICATION  Patient Name: [name] [name] Birthdate: 03-22-1946 Sex: female Admission Date (Current Location): 09/18/2022  [facility] and [place] Number:  Herbalist and Address:  [facility],  [place], [place]      Provider Number: [id number]  Attending Physician Name and Address:  Hosie Poisson, MD  Relative Name and Phone Number:  spouse, Raneisha Bress [phone]    Current Level of Care: Hospital Recommended Level of Care: [place] Prior Approval Number:    Date Approved/Denied:   PASRR Number:    Discharge Plan: SNF    Current Diagnoses: Patient Active Problem List   Diagnosis Date Noted   General weakness 09/19/2022   Bipolar disorder ([place]) 09/19/2022   Stage 3b chronic kidney disease (CKD) ([place]) 09/19/2022   AKI (acute kidney injury) ([place]) 09/19/2022   GAD (generalized anxiety disorder) 09/19/2022   Recurrent falls 09/19/2022   NSVT (nonsustained ventricular tachycardia) ([place]) 02/24/2021   Bifascicular block 02/24/2021   Fall at home 10/31/2020   Tardive dyskinesia 07/18/2020   Diabetic peripheral neuropathy ([place]) 07/18/2020   Gait abnormality 08/26/2019   Unspecified gastritis and gastroduodenitis without mention of hemorrhage 07/09/2011   Personal history of failed moderate sedation 05/20/2011   ACUTE CYSTITIS 02/23/2008   HYPERLIPIDEMIA 10/30/2007   DIABETES MELLITUS, TYPE II 07/08/2007   Iron deficiency anemia, unspecified 07/08/2007   DEPRESSION 07/08/2007   Essential hypertension 07/08/2007    Orientation RESPIRATION BLADDER Height & Weight     Self, Time, Situation, Place  Normal Incontinent, External catheter Weight: 213 lb 13.5 oz (97 kg) Height:  '5\' 3"'$  (160 cm)  BEHAVIORAL SYMPTOMS/MOOD NEUROLOGICAL BOWEL NUTRITION STATUS      Continent    AMBULATORY STATUS COMMUNICATION OF NEEDS Skin     Verbally                          Personal Care Assistance Level of Assistance  Bathing, Dressing Bathing Assistance: Limited assistance   Dressing Assistance: Limited assistance     Functional Limitations Info             [place]  PT (By licensed PT), OT (By licensed OT)     PT Frequency: 5x/wk OT Frequency: 5x/wk            Contractures Contractures Info: Not present    Additional Factors Info  Allergies, Code Status, Psychotropic Code Status Info: DNR Allergies Info: Latuda (Lurasidone), Aripiprazole, Bacitracin-polymyxin B, Losartan Potassium, Neo-bacit-poly-lidocaine, Quetiapine Psychotropic Info: see MAR         Current Medications (09/20/2022):  This is the current hospital active medication list Current Facility-Administered Medications  Medication Dose Route Frequency Provider Last Rate Last Admin   acetaminophen (TYLENOL) tablet 650 mg  650 mg Oral Q6H PRN Opyd, Ilene Qua, MD       Or   acetaminophen (TYLENOL) suppository 650 mg  650 mg Rectal Q6H PRN Opyd, Ilene Qua, MD       ALPRAZolam Duanne Moron) tablet 1 mg  1 mg Oral BID PRN Opyd, Ilene Qua, MD       amLODipine (NORVASC) tablet 5 mg  5 mg Oral Daily Hosie Poisson, MD   5 mg at 09/20/22 1151   atorvastatin (LIPITOR) tablet 20 mg  20 mg Oral QHS Leodis Sias T, RPH   20 mg at 09/19/22 2247   buPROPion (WELLBUTRIN XL)  24 hr tablet 150 mg  150 mg Oral Daily Opyd, Ilene Qua, MD   150 mg at 09/20/22 0955   colestipol (COLESTID) tablet 2 g  2 g Oral BID Hosie Poisson, MD   2 g at 09/20/22 0955   heparin injection 5,000 Units  5,000 Units Subcutaneous Q8H Opyd, Ilene Qua, MD   5,000 Units at 09/20/22 1434   lamoTRIgine (LAMICTAL) tablet 200 mg  200 mg Oral BID Opyd, Ilene Qua, MD   200 mg at 09/20/22 0955   ondansetron (ZOFRAN) tablet 4 mg  4 mg Oral Q6H PRN Opyd, Ilene Qua, MD       Or   ondansetron (ZOFRAN) injection 4 mg  4 mg Intravenous Q6H PRN Opyd, Ilene Qua, MD       senna-docusate (Senokot-S) tablet 1 tablet   1 tablet Oral QHS PRN Opyd, Ilene Qua, MD       sertraline (ZOLOFT) tablet 100 mg  100 mg Oral BID Opyd, Ilene Qua, MD   100 mg at 09/20/22 0955   sodium chloride flush (NS) 0.9 % injection 3 mL  3 mL Intravenous Q12H Opyd, Ilene Qua, MD   3 mL at 09/20/22 0958   valbenazine (INGREZZA) capsule 40 mg  40 mg Oral QHS Hosie Poisson, MD   40 mg at 09/19/22 2248     Discharge Medications: Please see discharge summary for a list of discharge medications.  Relevant Imaging Results:  Relevant Lab Results:   Additional Information SS# [id number]  Lennart Pall, LCSW

## 2022-09-20 NOTE — Progress Notes (Signed)
Transition of Care (TOC) -30 day Note       Patient Details  Name: [name] [name] MRN:  [id number] Date of Birth:  2046/03/29   Transition [facility]) CM/SW Contact  Name:  Lennart Pall Phone Number:  [phone] Date:  09/20/22 Time:1616   MUST ID: [id number]   To Whom it May Concern:   Please be advised that the above patient will require a short-term nursing home stay, anticipated 30 days or less rehabilitation and strengthening. The plan is for return home.

## 2022-09-21 DIAGNOSIS — F411 Generalized anxiety disorder: Secondary | ICD-10-CM | POA: Diagnosis not present

## 2022-09-21 DIAGNOSIS — R2681 Unsteadiness on feet: Secondary | ICD-10-CM

## 2022-09-21 DIAGNOSIS — I10 Essential (primary) hypertension: Secondary | ICD-10-CM | POA: Diagnosis not present

## 2022-09-21 DIAGNOSIS — R296 Repeated falls: Secondary | ICD-10-CM | POA: Diagnosis not present

## 2022-09-21 DIAGNOSIS — F319 Bipolar disorder, unspecified: Secondary | ICD-10-CM | POA: Diagnosis not present

## 2022-09-21 DIAGNOSIS — R531 Weakness: Secondary | ICD-10-CM | POA: Diagnosis not present

## 2022-09-21 DIAGNOSIS — Z7189 Other specified counseling: Secondary | ICD-10-CM

## 2022-09-21 DIAGNOSIS — Z66 Do not resuscitate: Secondary | ICD-10-CM

## 2022-09-21 DIAGNOSIS — Z515 Encounter for palliative care: Secondary | ICD-10-CM

## 2022-09-21 LAB — CBC
HCT: 37.8 % (ref 36.0–46.0)
Hemoglobin: 12.9 g/dL (ref 12.0–15.0)
MCH: 34.3 pg — ABNORMAL HIGH (ref 26.0–34.0)
MCHC: 34.1 g/dL (ref 30.0–36.0)
MCV: 100.5 fL — ABNORMAL HIGH (ref 80.0–100.0)
Platelets: 156 10*3/uL (ref 150–400)
RBC: 3.76 MIL/uL — ABNORMAL LOW (ref 3.87–5.11)
RDW: 12.5 % (ref 11.5–15.5)
WBC: 7.3 10*3/uL (ref 4.0–10.5)
nRBC: 0 % (ref 0.0–0.2)

## 2022-09-21 LAB — BASIC METABOLIC PANEL
Anion gap: 10 (ref 5–15)
BUN: 19 mg/dL (ref 8–23)
CO2: 22 mmol/L (ref 22–32)
Calcium: 9.5 mg/dL (ref 8.9–10.3)
Chloride: 106 mmol/L (ref 98–111)
Creatinine, Ser: 1.2 mg/dL — ABNORMAL HIGH (ref 0.44–1.00)
GFR, Estimated: 47 mL/min — ABNORMAL LOW (ref >60)
Glucose, Bld: 99 mg/dL (ref 70–99)
Potassium: 4.2 mmol/L (ref 3.5–5.1)
Sodium: 138 mmol/L (ref 135–145)

## 2022-09-21 MED ORDER — HYDRALAZINE HCL 20 MG/ML IJ SOLN
10.0000 mg | Freq: Once | INTRAMUSCULAR | Status: AC
  Administered 2022-09-21: 10 mg via INTRAVENOUS
  Filled 2022-09-21: qty 1

## 2022-09-21 MED ORDER — HYDRALAZINE HCL 20 MG/ML IJ SOLN
10.0000 mg | Freq: Four times a day (QID) | INTRAMUSCULAR | Status: DC | PRN
  Administered 2022-09-21 – 2022-09-26 (×2): 10 mg via INTRAVENOUS
  Filled 2022-09-21 (×3): qty 1

## 2022-09-21 NOTE — TOC Progression Note (Signed)
Transition of Care [facility]) - Progression Note    Patient Details  Name: [name] [name] MRN: [id number] Date of Birth: 1946-05-12  Transition of [facility]) CM/SW Contact  Cecil Cobbs Phone Number: 09/21/2022, 6:49 PM  Clinical Narrative:     Level 2 [place] number is back 30 day [phone] [id number], expiration 10/20/1922.   Expected Discharge Plan: Skilled Nursing Facility Barriers to Discharge: Continued Medical Work up, [place] Rosalie Gums), SNF Pending bed offer  Expected Discharge Plan and Services Expected Discharge Plan: [place] In-house Referral: Clinical Social Work   Post Acute Care Choice: [place] Living arrangements for the past 2 months: Single Family Home                 DME Arranged: N/A DME Agency: NA                   Social Determinants of Health (SDOH) Interventions    Readmission Risk Interventions     No data to display

## 2022-09-21 NOTE — Progress Notes (Signed)
Pt has declined CPAP QHS for the night.

## 2022-09-21 NOTE — Progress Notes (Signed)
[facility]                                                                               [name] [name], is a 76 y.o. female, DOB - 1946-03-22, [id number] Admit date - 09/18/2022    Outpatient Primary MD for the patient is [name] Manes, MD  LOS - 2  days    Brief summary   [name] [name] is a 76 y.o. female with medical history significant for bipolar disorder, generalized anxiety disorder, tardive dyskinesia, CKD 3A, hypertension, and OSA on CPAP who presents emergency department with generalized weakness, falls, and poor appetite.    So far work up has been negative for infection.  Pt reports worsening falls in the last few days. NO SYNCOPE. She had extensive work up with loop recorder and no arrhythmias found.  She underwent CT angio of the head and MRI brain, negative for stroke.  Pt seen and examined, she reports nauseated.    Assessment & Plan    Assessment and Plan:   Generalized weakness and recurrent falls:  Unclear etiology. Could be secondary to her tardive dyskinesia, peripheral neuropathy or secondary to her meds  Therapy eval recommending SNF.  TSH wnl. Vit b12 and folate wnl.  She was seen by neurology and underwent EEG in the past.  Urine cultures multiple species present.    Bipolar disorder.  Resume home  meds.  Unfortunately she has not been getting xanax as it is prn. She appears to have some withdrawals from it.  Discussed with RN, to give her xanax today and tonight.    Uncontrolled Hypertension:  BP parameters are optimal.  Continue with norvasc 5 mg daily.     Generalized anxiety disorder;  Resume xanax. Missed the doses last 2 days.    Stage 3a CKD Creatinine is stable for 1.2    OSA on CPAP.   Constipation:  Hold off colestipol.    Estimated body mass index is 37.49 kg/m as calculated from the following:   Height as of this encounter: '5\' 3"'$  (1.6 m).   Weight as of this encounter: 96 kg.  Code Status:  DNR DVT Prophylaxis:  heparin injection 5,000 Units Start: 09/19/22 0600   Level of Care: Level of care: Med-Surg Family Communication: none at bedside.   Disposition Plan:     Remains inpatient appropriate:  SNF placement.   Procedures:  MRI brain   Consultants:   None.   Antimicrobials:   Anti-infectives (From admission, onward)    Start     Dose/Rate Route Frequency Ordered Stop   09/19/22 0000  cefTRIAXone (ROCEPHIN) 1 g in sodium chloride 0.9 % 100 mL IVPB        1 g 200 mL/hr over 30 Minutes Intravenous  Once 09/18/22 2346 09/19/22 0219        Medications  Scheduled Meds:  amLODipine  5 mg Oral Daily   atorvastatin  20 mg Oral QHS   buPROPion  150 mg Oral Daily   colestipol  2 g Oral BID   heparin  5,000 Units Subcutaneous Q8H   lamoTRIgine  200 mg Oral BID   sertraline  100 mg Oral  BID   sodium chloride flush  3 mL Intravenous Q12H   valbenazine  40 mg Oral QHS   Continuous Infusions: PRN Meds:.acetaminophen **OR** acetaminophen, ALPRAZolam, hydrALAZINE, ondansetron **OR** ondansetron (ZOFRAN) IV, senna-docusate    Subjective:   [name] [name] was seen and examined today.  No BM today, nauseated, anxious and jittery.   Objective:   Vitals:   09/21/22 0500 09/21/22 0603 09/21/22 0800 09/21/22 1303  BP:  (!) 164/116 (!) 156/84 137/73  Pulse:  67 68 84  Resp:  '18 18 20  '$ Temp:  99.1 F (37.3 C) 98.4 F (36.9 C) 98.5 F (36.9 C)  TempSrc:  Oral Oral   SpO2:  95% 97% 96%  Weight: 96 kg     Height:        Intake/Output Summary (Last 24 hours) at 09/21/2022 1550 Last data filed at 09/21/2022 1300 Gross per 24 hour  Intake 1080 ml  Output 850 ml  Net 230 ml    Filed Weights   09/18/22 2240 09/21/22 0500  Weight: 97 kg 96 kg     Exam General exam: Appears calm and comfortable  Respiratory system: Clear to auscultation. Respiratory effort normal. Cardiovascular system: S1 & S2 heard, RRR. No JVD,  No pedal edema. Gastrointestinal system:  Abdomen is nondistended, soft and nontender.  Normal bowel sounds heard. Central nervous system: Alert and oriented. No focal neurological deficits. Extremities: Symmetric 5 x 5 power. Skin: No rashes, lesions or ulcers Psychiatry:  Mood & affect appropriate.     Data Reviewed:  I have personally reviewed following labs and imaging studies   CBC Lab Results  Component Value Date   WBC 7.3 09/21/2022   RBC 3.76 (L) 09/21/2022   HGB 12.9 09/21/2022   HCT 37.8 09/21/2022   MCV 100.5 (H) 09/21/2022   MCH 34.3 (H) 09/21/2022   PLT 156 09/21/2022   MCHC 34.1 09/21/2022   RDW 12.5 09/21/2022   LYMPHSABS 0.7 10/31/2020   MONOABS 0.5 10/31/2020   EOSABS 0.2 10/31/2020   BASOSABS 0.0 96/28/3662     Last metabolic panel Lab Results  Component Value Date   NA 138 09/21/2022   K 4.2 09/21/2022   CL 106 09/21/2022   CO2 22 09/21/2022   BUN 19 09/21/2022   CREATININE 1.20 (H) 09/21/2022   GLUCOSE 99 09/21/2022   GFRNONAA 47 (L) 09/21/2022   GFRAA 47 (L) 12/06/2019   CALCIUM 9.5 09/21/2022   PROT 7.1 09/18/2022   ALBUMIN 3.9 09/18/2022   LABGLOB 2.9 01/05/2020   BILITOT 0.6 09/18/2022   ALKPHOS 53 09/18/2022   AST 21 09/18/2022   ALT 15 09/18/2022   ANIONGAP 10 09/21/2022    CBG (last 3)  No results for input(s): "GLUCAP" in the last 72 hours.     Coagulation Profile: No results for input(s): "INR", "PROTIME" in the last 168 hours.   Radiology Studies: MR BRAIN WO CONTRAST  Result Date: 09/19/2022 CLINICAL DATA:  Syncope/presyncope, cerebrovascular cause suspected Recurrent falls, EXAM: MRI HEAD WITHOUT CONTRAST TECHNIQUE: Multiplanar, multiecho pulse sequences of the brain and surrounding structures were obtained without intravenous contrast. COMPARISON:  MRI Head 10/31/2020. FINDINGS: Brain: No acute infarction, hemorrhage, hydrocephalus, extra-axial collection or mass lesion. Mild for age scattered T2/FLAIR hyperintensities in the white matter, compatible with  chronic microvascular ischemic disease. Vascular: Major arterial flow voids are maintained at the skull base. Skull and upper cervical spine: Normal marrow signal. Sinuses/Orbits: Negative. Other: No sizable mastoid effusions. IMPRESSION: No evidence of acute intracranial abnormality. Electronically  Signed   By: Margaretha Sheffield M.D.   On: 09/19/2022 16:03       Hosie Poisson M.D. [facility] 09/21/2022, 3:50 PM  Available via Epic secure chat 7am-7pm After 7 pm, please refer to night coverage provider listed on amion.

## 2022-09-21 NOTE — Consult Note (Signed)
Palliative Care Consult Note                                  Date: 09/21/2022   Patient Name: [name] [name]  DOB: May 14, 1946  MRN: [id number]  Age / Sex: 76 y.o., female  PCP: [name], MD Referring Physician: Hosie Poisson, MD  Reason for Consultation: {Reason for Consult:23484}  HPI/Patient Profile: Palliative Care consult requested for goals of care discussion in this 76 y.o. female  with past medical history of *** admitted on 09/18/2022 with ***.   Past Medical History:  Diagnosis Date   Allergic rhinitis    Bipolar disorder ([place])    Cholelithiasis    seen on CAT 01/04/09   Chronic renal disease, stage III ([place])    8/36   Complication of anesthesia    versed and fentanyl did not work for colonoscopy   Diabetic peripheral neuropathy ([place]) 07/18/2020   DM (diabetes mellitus) ([place])    Edema    Gait abnormality 08/26/2019   HLD (hyperlipidemia)    HTN (hypertension)    Iron deficiency anemia    3/12    Lymphedema    tarda   Neuropathy    Obstructive sleep apnea on CPAP    Osteoarthritis    Renal cyst    2.7 cm seen on CAT 01/04/09   Tardive dyskinesia      Subjective:   This NP Osborne Oman reviewed medical records, received report from team, assessed the patient and then met at the patient's bedside with *** to discuss diagnosis, prognosis, GOC, EOL wishes disposition and options.   Concept of Palliative Care was introduced as specialized medical care for people and their families living with serious illness.  It focuses on providing relief from the symptoms and stress of a serious illness.  The goal is to improve quality of life for both the patient and the family. Values and goals of care important to patient and family were attempted to be elicited.  Created space and opportunity for patient and family to explore state of health prior to admission, thoughts, and feelings.   We discussed Her current illness and  what it means in the larger context of Her on-going co-morbidities. Natural disease trajectory and expectations were discussed.  **verbalized understanding of current illness and co-morbidities.   I discussed the importance of continued conversation with family and their medical providers regarding overall plan of care and treatment options, ensuring decisions are within the context of the patients values and GOCs.  Questions and concerns were addressed.  Hard Choices booklet left for review. The family was encouraged to call with questions or concerns.  PMT will continue to support holistically as needed.  Life Review: ***   Objective:   Primary Diagnoses: Present on Admission:  Bipolar disorder ([place])  Essential hypertension  Stage 3b chronic kidney disease (CKD) ([place])   Scheduled Meds:  amLODipine  5 mg Oral Daily   atorvastatin  20 mg Oral QHS   buPROPion  150 mg Oral Daily   colestipol  2 g Oral BID   heparin  5,000 Units Subcutaneous Q8H   lamoTRIgine  200 mg Oral BID   sertraline  100 mg Oral BID   sodium chloride flush  3 mL Intravenous Q12H   valbenazine  40 mg Oral QHS    Continuous Infusions:   PRN Meds: acetaminophen **OR** acetaminophen, ALPRAZolam, hydrALAZINE, ondansetron **OR** ondansetron (ZOFRAN)  IV, senna-docusate  Allergies  Allergen Reactions   Latuda [Lurasidone] Other (See Comments)    Increased suicidal thoughts    Aripiprazole Other (See Comments)    Tardive Dyskinesia Other reaction(s): tardive dyskinesia   Bacitracin-Polymyxin B     Other reaction(s): rash   Losartan Potassium     Other reaction(s): decline gfr   Neo-Bacit-Poly-Lidocaine    Quetiapine Other (See Comments)    Tardive Dyskinesia Other reaction(s): tardive dyskinesia    Review of Systems Unless otherwise noted, a complete review of systems is negative.  Physical Exam General: NAD, frail chronically-ill appearing Cardiovascular: regular rate and rhythm Pulmonary:  clear ant fields, diminished bilaterally  Abdomen: soft, nontender, + bowel sounds Extremities: no edema, no joint deformities Skin: no rashes, warm and dry Neurological:   Vital Signs:  BP (!) 156/84 (BP Location: Left Arm)   Pulse 68   Temp 98.4 F (36.9 C) (Oral)   Resp 18   Ht _0  (1.6 m)   Wt 96 kg   SpO2 97%   BMI 37.49 kg/m  Pain Scale: 0-10 POSS *See Group Information*: 1-Acceptable,Awake and alert Pain Score: 4   SpO2: SpO2: 97 % O2 Device:SpO2: 97 % O2 Flow Rate: .   IO: Intake/output summary:  Intake/Output Summary (Last 24 hours) at 09/21/2022 0944 Last data filed at 09/21/2022 0941 Gross per 24 hour  Intake 1560 ml  Output 1100 ml  Net 460 ml    LBM: Last BM Date : 09/19/22 Baseline Weight: Weight: 97 kg Most recent weight: Weight: 96 kg      Palliative Assessment/Data: ***   Advanced Care Planning:   Primary Decision Maker: {Primary Decision ZOXWR:60454}  Code Status/Advance Care Planning: {Palliative Code status:23503}  A discussion was had today regarding advanced directives. Concepts specific to code status, artifical feeding and hydration, continued IV antibiotics and rehospitalization was had.  The difference between a aggressive medical intervention path and a palliative comfort care path was discussed. ***The MOST form was introduced and discussed.***  Hospice and Palliative Care services outpatient were explained and offered. Patient and family verbalized their understanding and awareness of both palliative and hospice's goals and philosophy of care.   Decisions/Changes to ACP: ***  Assessment & Plan:   SUMMARY OF RECOMMENDATIONS   DNR/DNI/Full Code-as confirmed by  Continue with current plan of care  PMT will continue to support and follow as needed. Please call team line with urgent needs.  Symptom Management:  Per Attending/See below  Palliative Prophylaxis:  {Palliative Prophylaxis:21015}  Additional Recommendations  (Limitations, Scope, Preferences): {Recommended Scope and Preferences:21019}  Psycho-social/Spiritual:  Desire for further Chaplaincy support: {YES NO:22349} Additional Recommendations: {PAL SOCIAL:21064}  Prognosis:  {Palliative Care Prognosis:23504}  Discharge Planning:  {Palliative dispostion:23505}   Discussed with:     Patient*** expressed understanding and was in agreement with this plan.   Time In: *** Time Out: *** Time Total: ***  Visit consisted of counseling and education dealing with the complex and emotionally intense issues of symptom management and palliative care in the setting of serious and potentially life-threatening illness.Greater than 50%  of this time was spent counseling and coordinating care related to the above assessment and plan.  Signed by:  Alda Lea, AGPCNP-BC Palliative Medicine Team  Phone: [phone] Pager: [phone] Amion: Bjorn Pippin   Thank you for allowing the Palliative Medicine Team to assist in the care of this patient. Please utilize secure chat with additional questions, if there is no response within 30 minutes please call  the above phone number. Palliative Medicine Team providers are available by phone from 7am to 5pm daily and can be reached through the team cell phone.  Should this patient require assistance outside of these hours, please call the patient's attending physician.

## 2022-09-22 DIAGNOSIS — F411 Generalized anxiety disorder: Secondary | ICD-10-CM | POA: Diagnosis not present

## 2022-09-22 DIAGNOSIS — I10 Essential (primary) hypertension: Secondary | ICD-10-CM | POA: Diagnosis not present

## 2022-09-22 DIAGNOSIS — R531 Weakness: Secondary | ICD-10-CM | POA: Diagnosis not present

## 2022-09-22 DIAGNOSIS — F319 Bipolar disorder, unspecified: Secondary | ICD-10-CM | POA: Diagnosis not present

## 2022-09-22 MED ORDER — COLESTIPOL HCL 1 G PO TABS
2.0000 g | ORAL_TABLET | Freq: Two times a day (BID) | ORAL | Status: DC
  Administered 2022-09-22 (×2): 1 g via ORAL
  Administered 2022-09-23 – 2022-09-25 (×3): 2 g via ORAL
  Filled 2022-09-22 (×10): qty 2

## 2022-09-22 NOTE — Progress Notes (Signed)
Pt continues to decline CPAP QHS.  Pt requested RT to remove machine from room.

## 2022-09-22 NOTE — Progress Notes (Signed)
[facility]                                                                               [name] [name], is a 76 y.o. female, DOB - 02-Dec-1945, ZJI:[id number] Admit date - 09/18/2022    Outpatient Primary MD for the patient is [name], MD  LOS - 3  days    Brief summary   [name] [name] is a 76 y.o. female with medical history significant for bipolar disorder, generalized anxiety disorder, tardive dyskinesia, CKD 3A, hypertension, and OSA on CPAP who presents emergency department with generalized weakness, falls, and poor appetite.    So far work up has been negative for infection.  Pt reports worsening falls in the last few days. NO SYNCOPE. She had extensive work up with loop recorder and no arrhythmias found.  She underwent CT angio of the head and MRI brain, negative for stroke.  Pt seen and examined,  slight headache today.    Assessment & Plan    Assessment and Plan:   Generalized weakness and recurrent falls:  Unclear etiology. Could be secondary to her tardive dyskinesia, peripheral neuropathy or secondary to her meds  Therapy eval recommending SNF.  TSH wnl. Vit b12 and folate wnl.  She was seen by neurology and underwent EEG in the past.  Urine cultures multiple species present.    Bipolar disorder.  Resume home  meds.  Unfortunately she has not been getting xanax as it is prn. She appears to have some withdrawals from it.  Discussed with RN, to give her xanax today and tonight.    Uncontrolled Hypertension:  BP parameters are well controlled.  Continue with norvasc 5 mg daily.     Generalized anxiety disorder;  Resume xanax.  Nausea and anxiety improved.  Encouraged her to ask for xanax as it is ordered prn.    Stage 3a CKD Creatinine is stable for 1.2    OSA on CPAP.   Constipation:  Resolved, had 1 bm today.    Estimated body mass index is 37.57 kg/m as calculated from the following:   Height as of this encounter: 5'  3" (1.6 m).   Weight as of this encounter: 96.2 kg.  Code Status: DNR DVT Prophylaxis:  heparin injection 5,000 Units Start: 09/19/22 0600   Level of Care: Level of care: Med-Surg Family Communication: none at bedside.   Disposition Plan:     Remains inpatient appropriate:  SNF placement.   Procedures:  MRI brain   Consultants:   None.   Antimicrobials:   Anti-infectives (From admission, onward)    Start     Dose/Rate Route Frequency Ordered Stop   09/19/22 0000  cefTRIAXone (ROCEPHIN) 1 g in sodium chloride 0.9 % 100 mL IVPB        1 g 200 mL/hr over 30 Minutes Intravenous  Once 09/18/22 2346 09/19/22 0219        Medications  Scheduled Meds:  amLODipine  5 mg Oral Daily   atorvastatin  20 mg Oral QHS   buPROPion  150 mg Oral Daily   colestipol  2 g Oral BID   heparin  5,000 Units Subcutaneous Q8H  lamoTRIgine  200 mg Oral BID   sertraline  100 mg Oral BID   sodium chloride flush  3 mL Intravenous Q12H   valbenazine  40 mg Oral QHS   Continuous Infusions: PRN Meds:.acetaminophen **OR** acetaminophen, ALPRAZolam, hydrALAZINE, ondansetron **OR** ondansetron (ZOFRAN) IV, senna-docusate    Subjective:   [name] [name] was seen and examined today.  Slight headache.   Objective:   Vitals:   09/21/22 2300 09/22/22 0500 09/22/22 0552 09/22/22 1359  BP: (!) 150/84  (!) 158/73 129/78  Pulse:   63 77  Resp:   18 18  Temp:   98.4 F (36.9 C) 97.8 F (36.6 C)  TempSrc:   Oral Oral  SpO2:   91% 95%  Weight:  96.2 kg    Height:        Intake/Output Summary (Last 24 hours) at 09/22/2022 1843 Last data filed at 09/22/2022 0600 Gross per 24 hour  Intake 240 ml  Output 1450 ml  Net -1210 ml    Filed Weights   09/18/22 2240 09/21/22 0500 09/22/22 0500  Weight: 97 kg 96 kg 96.2 kg     Exam General exam: Appears calm and comfortable  Respiratory system: Clear to auscultation. Respiratory effort normal. Cardiovascular system: S1 & S2 heard, RRR. No JVD,No  pedal edema. Gastrointestinal system: Abdomen is nondistended, soft and nontender.  Normal bowel sounds heard. Central nervous system: Alert and oriented. No focal neurological deficits. Extremities: Symmetric 5 x 5 power. Skin: No rashes, lesions or ulcers Psychiatry:  Mood & affect appropriate.      Data Reviewed:  I have personally reviewed following labs and imaging studies   CBC Lab Results  Component Value Date   WBC 7.3 09/21/2022   RBC 3.76 (L) 09/21/2022   HGB 12.9 09/21/2022   HCT 37.8 09/21/2022   MCV 100.5 (H) 09/21/2022   MCH 34.3 (H) 09/21/2022   PLT 156 09/21/2022   MCHC 34.1 09/21/2022   RDW 12.5 09/21/2022   LYMPHSABS 0.7 10/31/2020   MONOABS 0.5 10/31/2020   EOSABS 0.2 10/31/2020   BASOSABS 0.0 67/20/9470     Last metabolic panel Lab Results  Component Value Date   NA 138 09/21/2022   K 4.2 09/21/2022   CL 106 09/21/2022   CO2 22 09/21/2022   BUN 19 09/21/2022   CREATININE 1.20 (H) 09/21/2022   GLUCOSE 99 09/21/2022   GFRNONAA 47 (L) 09/21/2022   GFRAA 47 (L) 12/06/2019   CALCIUM 9.5 09/21/2022   PROT 7.1 09/18/2022   ALBUMIN 3.9 09/18/2022   LABGLOB 2.9 01/05/2020   BILITOT 0.6 09/18/2022   ALKPHOS 53 09/18/2022   AST 21 09/18/2022   ALT 15 09/18/2022   ANIONGAP 10 09/21/2022    CBG (last 3)  No results for input(s): "GLUCAP" in the last 72 hours.     Coagulation Profile: No results for input(s): "INR", "PROTIME" in the last 168 hours.   Radiology Studies: No results found.     Hosie Poisson M.D. [facility] 09/22/2022, 6:43 PM  Available via Epic secure chat 7am-7pm After 7 pm, please refer to night coverage provider listed on amion.

## 2022-09-23 ENCOUNTER — Inpatient Hospital Stay (HOSPITAL_COMMUNITY): Payer: Medicare Other

## 2022-09-23 ENCOUNTER — Ambulatory Visit (INDEPENDENT_AMBULATORY_CARE_PROVIDER_SITE_OTHER): Payer: Medicare Other

## 2022-09-23 DIAGNOSIS — M7989 Other specified soft tissue disorders: Secondary | ICD-10-CM

## 2022-09-23 DIAGNOSIS — R531 Weakness: Secondary | ICD-10-CM | POA: Diagnosis not present

## 2022-09-23 DIAGNOSIS — R55 Syncope and collapse: Secondary | ICD-10-CM

## 2022-09-23 DIAGNOSIS — N179 Acute kidney failure, unspecified: Secondary | ICD-10-CM | POA: Diagnosis not present

## 2022-09-23 DIAGNOSIS — I10 Essential (primary) hypertension: Secondary | ICD-10-CM | POA: Diagnosis not present

## 2022-09-23 DIAGNOSIS — F319 Bipolar disorder, unspecified: Secondary | ICD-10-CM | POA: Diagnosis not present

## 2022-09-23 NOTE — Progress Notes (Signed)
Right lower extremity venous duplex has been completed. Preliminary results can be found in CV Proc through chart review.   09/23/22 2:27 PM Carlos Levering RVT ,

## 2022-09-23 NOTE — Plan of Care (Signed)
  Problem: Education: Goal: Knowledge of General Education information will improve Description: Including pain rating scale, medication(s)/side effects and non-pharmacologic comfort measures Outcome: Progressing   Problem: Pain Managment: Goal: General experience of comfort will improve Outcome: Progressing   Problem: Safety: Goal: Ability to remain free from injury will improve Outcome: Progressing   

## 2022-09-23 NOTE — Progress Notes (Signed)
Physical Therapy Treatment Patient Details Name: [name] [name] MRN: [id number] DOB: 1946-09-21 Today's Date: 09/23/2022   History of Present Illness [name] [name] is a 76 y.o. female with medical history significant for bipolar disorder, generalized anxiety disorder, tardive dyskinesia, CKD 3A, hypertension, lymphedema, and OSA on CPAP, syncopal episodes(on loop recorder) who presents emergency department 09/18/22 with generalized weakness, falls, and poor appetite.Head CT is negative for acute intracranial abnormality and CTA head is a normal study.    PT Comments    Patient stated she had 1/10 pain in L thigh and on the bruising on the R LE started to hurt. No increased pain. Required supervision for bed mobility, min guard for transfers, and min guard/min assist for ambulation. Pt stated she felt unstable during ambulation but presented fine "do I look like I'm wobbling because I feel like I'm wobbling side to side". Ambulated with wide BOS. Pt will need ST Rehab at SNF to address mobility and functional decline prior to safely returning home.   Recommendations for follow up therapy are one component of a multi-disciplinary discharge planning process, led by the attending physician.  Recommendations may be updated based on patient status, additional functional criteria and insurance authorization.  Follow Up Recommendations  Skilled nursing-short term rehab (<3 hours/day)     Assistance Recommended at Discharge Frequent or constant Supervision/Assistance  Patient can return home with the following A lot of help with walking and/or transfers;A little help with bathing/dressing/bathroom;Assistance with cooking/housework;Assist for transportation;Help with stairs or ramp for entrance   Equipment Recommendations  None recommended by PT    Recommendations for Other Services       Precautions / Restrictions Precautions Precautions: Fall Precaution Comments: Tartive  Dyskinesia Restrictions Weight Bearing Restrictions: No     Mobility  Bed Mobility Overal bed mobility: Modified Independent Bed Mobility: Supine to Sit     Supine to sit: Supervision          Transfers Overall transfer level: Needs assistance Equipment used: Rolling walker (2 wheels) Transfers: Sit to/from Stand Sit to Stand: Min guard           General transfer comment: sit to stand x3, Min guard needed for safety.    Ambulation/Gait Ambulation/Gait assistance: Min guard, Min assist Gait Distance (Feet): 150 Feet Assistive device: Rolling walker (2 wheels) Gait Pattern/deviations: Step-through pattern, Wide base of support Gait velocity: decreased     General Gait Details: Ambulated to the bathroom and in the hallways. Pt stated she felt as if she was "wobbling" side to side but looked normal (tastive dyskinesia). Min guard/min assist needed for safety.   Stairs             Wheelchair Mobility    Modified Rankin (Stroke Patients Only)       Balance                                            Cognition Arousal/Alertness: Awake/alert Behavior During Therapy: WFL for tasks assessed/performed Overall Cognitive Status: Within Functional Limits for tasks assessed                                 General Comments: AxO x 3 pleasant        Exercises      General Comments  Pertinent Vitals/Pain Pain Assessment Pain Assessment: 0-10 Pain Score: 1  Pain Location: L thigh and bruising on R LE Pain Intervention(s): Monitored during session, Ice applied, Premedicated before session    Home Living                          Prior Function            PT Goals (current goals can now be found in the care plan section) Acute Rehab PT Goals Patient Stated Goal: to be able to walk again PT Goal Formulation: With patient Time For Goal Achievement: 10/03/22 Potential to Achieve Goals: Good Progress  towards PT goals: Progressing toward goals    Frequency    Min 2X/week      PT Plan Current plan remains appropriate    Co-evaluation              AM-PAC PT "6 Clicks" Mobility   Outcome Measure  Help needed turning from your back to your side while in a flat bed without using bedrails?: A Little Help needed moving from lying on your back to sitting on the side of a flat bed without using bedrails?: A Little Help needed moving to and from a bed to a chair (including a wheelchair)?: A Little Help needed standing up from a chair using your arms (e.g., wheelchair or bedside chair)?: A Little Help needed to walk in hospital room?: A Little Help needed climbing 3-5 steps with a railing? : A Lot 6 Click Score: 17    End of Session Equipment Utilized During Treatment: Gait belt Activity Tolerance: Patient tolerated treatment well Patient left: in chair;with call bell/phone within reach;with chair alarm set Nurse Communication: Mobility status PT Visit Diagnosis: Unsteadiness on feet (R26.81);Repeated falls (R29.6);Other symptoms and signs involving the nervous system (R29.898);Difficulty in walking, not elsewhere classified (R26.2)     Time: 2119-4174 PT Time Calculation (min) (ACUTE ONLY): 14 min  Charges:  $Gait Training: 8-22 mins                        Yamilka Lopiccolo Chauncey Cruel 09/23/2022, 2:57 PM

## 2022-09-23 NOTE — Progress Notes (Signed)
[facility]                                                                               [name] [name], is a 76 y.o. female, DOB - 1946-02-17, UVO:[id number] Admit date - 09/18/2022    Outpatient Primary MD for the patient is [name] Manes, MD  LOS - 4  days    Brief summary   [name] is a 76 y.o. female with medical history significant for bipolar disorder, generalized anxiety disorder, tardive dyskinesia, CKD 3A, hypertension, and OSA on CPAP who presents emergency department with generalized weakness, falls, and poor appetite.    So far work up has been negative for infection.  Pt reports worsening falls in the last few days. NO SYNCOPE. She had extensive work up with loop recorder and no arrhythmias found.  She underwent CT angio of the head and MRI brain, negative for stroke.  Pt seen and examined, today, she reports that she might have lost consciousness ,but she is not sure.   Assessment & Plan    Assessment and Plan:   Generalized weakness and recurrent falls:  Unclear etiology. Could be secondary to her tardive dyskinesia, peripheral neuropathy or secondary to her meds .  Initial CTA ad MRI brain are negative for acute stroke or which would explain her recurrent falls.  Today she reports she is not sure if she lost consciousness, . Her history is unreliable.  She reports having a loop recorder. Will get echocardiogram for further evaluation as she reports she might have lost consciousness.  Therapy eval recommending SNF.  TSH wnl. Vit b12 and folate wnl.  She was seen by neurology and underwent EEG in the past.  Urine cultures multiple species present.    Bipolar disorder.  Resume home  meds.  Resume xanax.    Hypertension Continue with norvasc 5 mg daily.  Will add hydralazine prn.     Generalized anxiety disorder;  Resume xanax.  Nausea and anxiety improved.  Encouraged her to ask for xanax as it is ordered prn.    Stage 3a  CKD Creatinine is stable for 1.2    OSA on CPAP.   Constipation:  Resolved, had 1 bm today.   Right lower extremity bruising and tenderness, suspect from the recurrent falls,  She is concerned about DVT,  Venous dupleX of the lower extremity ordered and is negative for DVT.    Estimated body mass index is 37.57 kg/m as calculated from the following:   Height as of this encounter: '5\' 3"'$  (1.6 m).   Weight as of this encounter: 96.2 kg.  Code Status: DNR DVT Prophylaxis:  heparin injection 5,000 Units Start: 09/19/22 0600   Level of Care: Level of care: Med-Surg Family Communication: none at bedside.   Disposition Plan:     Remains inpatient appropriate:  SNF placement.   Procedures:  MRI brain   Consultants:   None.   Antimicrobials:   Anti-infectives (From admission, onward)    Start     Dose/Rate Route Frequency Ordered Stop   09/19/22 0000  cefTRIAXone (ROCEPHIN) 1 g in sodium chloride 0.9 % 100 mL IVPB  1 g 200 mL/hr over 30 Minutes Intravenous  Once 09/18/22 2346 09/19/22 0219        Medications  Scheduled Meds:  amLODipine  5 mg Oral Daily   atorvastatin  20 mg Oral QHS   buPROPion  150 mg Oral Daily   colestipol  2 g Oral BID   heparin  5,000 Units Subcutaneous Q8H   lamoTRIgine  200 mg Oral BID   sertraline  100 mg Oral BID   sodium chloride flush  3 mL Intravenous Q12H   valbenazine  40 mg Oral QHS   Continuous Infusions: PRN Meds:.acetaminophen **OR** acetaminophen, ALPRAZolam, hydrALAZINE, ondansetron **OR** ondansetron (ZOFRAN) IV, senna-docusate    Subjective:   [name] was seen and examined today.  Bruising over the right lower extremity and tenderness.   Objective:   Vitals:   09/22/22 1359 09/22/22 2049 09/23/22 0531 09/23/22 1312  BP: 129/78 (!) 152/77 (!) 149/80 (!) 166/94  Pulse: 77 74 64 75  Resp: '18 18 18 18  '$ Temp: 97.8 F (36.6 C) 98.5 F (36.9 C) 98.1 F (36.7 C) 98.2 F (36.8 C)  TempSrc: Oral Oral  Oral Oral  SpO2: 95% 96% 91% 97%  Weight:      Height:        Intake/Output Summary (Last 24 hours) at 09/23/2022 1607 Last data filed at 09/23/2022 1103 Gross per 24 hour  Intake 720 ml  Output 0 ml  Net 720 ml    Filed Weights   09/18/22 2240 09/21/22 0500 09/22/22 0500  Weight: 97 kg 96 kg 96.2 kg     Exam General exam: Appears calm and comfortable  Respiratory system: Clear to auscultation. Respiratory effort normal. Cardiovascular system: S1 & S2 heard, RRR. No JVD,  Gastrointestinal system: Abdomen is nondistended, soft and nontender.  Central nervous system: Alert and oriented. No focal neurological deficits. Extremities: right lower leg bruising and tenderness.  Skin: No rashes, lesions or ulcers Psychiatry: Mood & affect appropriate.       Data Reviewed:  I have personally reviewed following labs and imaging studies   CBC Lab Results  Component Value Date   WBC 7.3 09/21/2022   RBC 3.76 (L) 09/21/2022   HGB 12.9 09/21/2022   HCT 37.8 09/21/2022   MCV 100.5 (H) 09/21/2022   MCH 34.3 (H) 09/21/2022   PLT 156 09/21/2022   MCHC 34.1 09/21/2022   RDW 12.5 09/21/2022   LYMPHSABS 0.7 10/31/2020   MONOABS 0.5 10/31/2020   EOSABS 0.2 10/31/2020   BASOSABS 0.0 42/70/6237     Last metabolic panel Lab Results  Component Value Date   NA 138 09/21/2022   K 4.2 09/21/2022   CL 106 09/21/2022   CO2 22 09/21/2022   BUN 19 09/21/2022   CREATININE 1.20 (H) 09/21/2022   GLUCOSE 99 09/21/2022   GFRNONAA 47 (L) 09/21/2022   GFRAA 47 (L) 12/06/2019   CALCIUM 9.5 09/21/2022   PROT 7.1 09/18/2022   ALBUMIN 3.9 09/18/2022   LABGLOB 2.9 01/05/2020   BILITOT 0.6 09/18/2022   ALKPHOS 53 09/18/2022   AST 21 09/18/2022   ALT 15 09/18/2022   ANIONGAP 10 09/21/2022    CBG (last 3)  No results for input(s): "GLUCAP" in the last 72 hours.     Coagulation Profile: No results for input(s): "INR", "PROTIME" in the last 168 hours.   Radiology Studies: VAS [place]  LOWER EXTREMITY VENOUS (DVT)  Result Date: 09/23/2022  Lower Venous DVT Study Patient Name:  [name] [name]  Date of Exam:   09/23/2022 Medical Rec #: [id number]       Accession #:    [id number] Date of Birth: 07/01/1946       Patient Gender: F Patient Age:   41 years Exam Location:  [facility] Procedure:      VAS [place] LOWER EXTREMITY VENOUS (DVT) Referring Phys: Hosie Poisson --------------------------------------------------------------------------------  Indications: Swelling.  Risk Factors: None identified. Limitations: Poor ultrasound/tissue interface. Comparison Study: No prior studies. Performing Technologist: Oliver Hum RVT  Examination Guidelines: A complete evaluation includes B-mode imaging, spectral Doppler, color Doppler, and power Doppler as needed of all accessible portions of each vessel. Bilateral testing is considered an integral part of a complete examination. Limited examinations for reoccurring indications may be performed as noted. The reflux portion of the exam is performed with the patient in reverse Trendelenburg.  +---------+---------------+---------+-----------+----------+--------------+ RIGHT    CompressibilityPhasicitySpontaneityPropertiesThrombus Aging +---------+---------------+---------+-----------+----------+--------------+ CFV      Full           Yes      Yes                                 +---------+---------------+---------+-----------+----------+--------------+ SFJ      Full                                                        +---------+---------------+---------+-----------+----------+--------------+ FV Prox  Full                                                        +---------+---------------+---------+-----------+----------+--------------+ FV Mid   Full                                                        +---------+---------------+---------+-----------+----------+--------------+ FV DistalFull                                                         +---------+---------------+---------+-----------+----------+--------------+ PFV      Full                                                        +---------+---------------+---------+-----------+----------+--------------+ POP      Full           Yes      Yes                                 +---------+---------------+---------+-----------+----------+--------------+ PTV      Full                                                        +---------+---------------+---------+-----------+----------+--------------+  PERO     Full                                                        +---------+---------------+---------+-----------+----------+--------------+   +----+---------------+---------+-----------+----------+--------------+ LEFTCompressibilityPhasicitySpontaneityPropertiesThrombus Aging +----+---------------+---------+-----------+----------+--------------+ CFV Full           Yes      Yes                                 +----+---------------+---------+-----------+----------+--------------+    Summary: RIGHT: - There is no evidence of deep vein thrombosis in the lower extremity.  - No cystic structure found in the popliteal fossa.  LEFT: - No evidence of common femoral vein obstruction.  *See table(s) above for measurements and observations.    Preliminary        Hosie Poisson M.D. [facility] 09/23/2022, 4:07 PM  Available via Epic secure chat 7am-7pm After 7 pm, please refer to night coverage provider listed on amion.

## 2022-09-23 NOTE — Care Management Important Message (Signed)
Important Message  Patient Details IM Letter given Name: [name] [name] MRN: [id number] Date of Birth: 23-Aug-1946   Medicare Important Message Given:  Yes     Kerin Salen 09/23/2022, 3:04 PM

## 2022-09-23 NOTE — Plan of Care (Signed)
?  Problem: Activity: ?Goal: Risk for activity intolerance will decrease ?Outcome: Progressing ?  ?Problem: Safety: ?Goal: Ability to remain free from injury will improve ?Outcome: Progressing ?  ?Problem: Pain Managment: ?Goal: General experience of comfort will improve ?Outcome: Progressing ?  ?

## 2022-09-24 ENCOUNTER — Inpatient Hospital Stay (HOSPITAL_COMMUNITY): Payer: Medicare Other

## 2022-09-24 DIAGNOSIS — R531 Weakness: Secondary | ICD-10-CM | POA: Diagnosis not present

## 2022-09-24 DIAGNOSIS — N179 Acute kidney failure, unspecified: Secondary | ICD-10-CM | POA: Diagnosis not present

## 2022-09-24 DIAGNOSIS — I10 Essential (primary) hypertension: Secondary | ICD-10-CM | POA: Diagnosis not present

## 2022-09-24 DIAGNOSIS — R55 Syncope and collapse: Secondary | ICD-10-CM

## 2022-09-24 DIAGNOSIS — F319 Bipolar disorder, unspecified: Secondary | ICD-10-CM | POA: Diagnosis not present

## 2022-09-24 LAB — ECHOCARDIOGRAM COMPLETE
AR max vel: 1.96 cm2
AV Area VTI: 2.03 cm2
AV Area mean vel: 1.76 cm2
AV Mean grad: 12 mmHg
AV Peak grad: 18.7 mmHg
Ao pk vel: 2.16 m/s
Area-P 1/2: 2.6 cm2
Calc EF: 64.8 %
Height: 63 in
MV M vel: 4.02 m/s
MV Peak grad: 64.6 mmHg
S' Lateral: 2.4 cm
Single Plane A2C EF: 58.7 %
Single Plane A4C EF: 68.8 %
Weight: 3287.5 oz

## 2022-09-24 LAB — CUP PACEART REMOTE DEVICE CHECK
Date Time Interrogation Session: 20231104230609
Implantable Pulse Generator Implant Date: 20220411

## 2022-09-24 MED ORDER — SENNOSIDES-DOCUSATE SODIUM 8.6-50 MG PO TABS: 1.0000 | ORAL_TABLET | Freq: Every evening | ORAL | Status: AC | PRN

## 2022-09-24 MED ORDER — ATORVASTATIN CALCIUM 20 MG PO TABS: 20.0000 mg | ORAL_TABLET | Freq: Every day | ORAL | 1 refills | Status: AC

## 2022-09-24 NOTE — Progress Notes (Signed)
Pt refused CPAP

## 2022-09-24 NOTE — TOC Progression Note (Signed)
Transition of Care [facility]) - Progression Note    Patient Details  Name: [name] [name] MRN: [id number] Date of Birth: 1946/11/18  [facility] CM/SW Contact  Lennart Pall, LCSW Phone Number: 09/24/2022, 1:51 PM  Clinical Narrative:    Met with pt to review SNF bed offers and pt has accepted bed with [facility].  MD anticipates likely ready for dc tomorrow.   Expected Discharge Plan: Skilled Nursing Facility Barriers to Discharge: Continued Medical Work up, [place] [facility]), SNF Pending bed offer  Expected Discharge Plan and Services Expected Discharge Plan: [place] In-house Referral: Clinical Social Work   Post Acute Care Choice: [place] Living arrangements for the past 2 months: Single Family Home                 DME Arranged: N/A DME Agency: NA                   Social Determinants of Health (SDOH) Interventions    Readmission Risk Interventions     No data to display

## 2022-09-24 NOTE — Plan of Care (Signed)
  Problem: Activity: Goal: Risk for activity intolerance will decrease Outcome: Progressing   Problem: Pain Managment: Goal: General experience of comfort will improve Outcome: Progressing   Problem: Safety: Goal: Ability to remain free from injury will improve Outcome: Progressing   

## 2022-09-24 NOTE — Progress Notes (Incomplete)
  Echocardiogram 2D Echocardiogram has been performed.  Lana Fish 09/24/2022, 11:49 AM

## 2022-09-24 NOTE — Progress Notes (Signed)
Occupational Therapy Treatment Patient Details Name: [name] [name] MRN: [id number] DOB: May 29, 1946 Today's Date: 09/24/2022   History of present illness [name] [name] is a 76 y.o. female with medical history significant for bipolar disorder, generalized anxiety disorder, tardive dyskinesia, CKD 3A, hypertension, lymphedema, and OSA on CPAP, syncopal episodes(on loop recorder) who presents emergency department 09/18/22 with generalized weakness, falls, and poor appetite.Head CT is negative for acute intracranial abnormality and CTA head is a normal study.   OT comments  Patient was able to engage in standing at sink for grooming tasks with noted LOB with no UE support with min A to maintain standing balance and education on recovery skills. Patient verbalized understanding. Patient would continue to benefit from skilled OT services at this time while admitted and after d/c to address noted deficits in order to improve overall safety and independence in ADLs.     Recommendations for follow up therapy are one component of a multi-disciplinary discharge planning process, led by the attending physician.  Recommendations may be updated based on patient status, additional functional criteria and insurance authorization.    Follow Up Recommendations  Skilled nursing-short term rehab (<3 hours/day)    Assistance Recommended at Discharge Frequent or constant Supervision/Assistance  Patient can return home with the following  A little help with walking and/or transfers;A lot of help with bathing/dressing/bathroom;Assistance with cooking/housework;Help with stairs or ramp for entrance   Equipment Recommendations  Other (comment) (defer to next venue)    Recommendations for Other Services      Precautions / Restrictions Precautions Precautions: Fall Precaution Comments: Tartive Dyskinesia Restrictions Weight Bearing Restrictions: No       Mobility Bed Mobility                     Transfers                         Balance Overall balance assessment: Needs assistance, History of Falls Sitting-balance support: No upper extremity supported, Feet supported Sitting balance-Leahy Scale: Good     Standing balance support: Bilateral upper extremity supported, During functional activity, Reliant on assistive device for balance Standing balance-Leahy Scale: Poor Standing balance comment: noted to have LOB with no UE support with poor recovery skills.                           ADL either performed or assessed with clinical judgement   ADL Overall ADL's : Needs assistance/impaired     Grooming: Wash/dry hands;Wash/dry face;Oral care;Brushing hair;Standing Grooming Details (indicate cue type and reason): at sink with one LOB with min A to maintain standing balance with no UE support. patient attempted to take more steps instead of holding walker and stopping task. Upper Body Bathing: Set up;Sitting Upper Body Bathing Details (indicate cue type and reason): EOB with increased time     Upper Body Dressing : Set up;Sitting     Lower Body Dressing Details (indicate cue type and reason): patient was able to bring BLE onto lap with figure four posture with increased time EOB with min A needed to adjust sock on R foot. Toilet Transfer: Minimal assistance;Ambulation;Rolling walker (2 wheels) Toilet Transfer Details (indicate cue type and reason): with increased time to transfer from edge of bed to recliner in room with patient quick to fatigue with functional activity.                Extremity/Trunk  Assessment              Vision       Perception     Praxis      Cognition Arousal/Alertness: Awake/alert Behavior During Therapy: WFL for tasks assessed/performed Overall Cognitive Status: Within Functional Limits for tasks assessed                                          Exercises      Shoulder Instructions        General Comments      Pertinent Vitals/ Pain       Pain Assessment Pain Assessment: 0-10 Pain Score: 1  Pain Location: L thigh and bruising on R LE Pain Descriptors / Indicators: Discomfort Pain Intervention(s): Monitored during session  Home Living                                          Prior Functioning/Environment              Frequency  Min 2X/week        Progress Toward Goals  OT Goals(current goals can now be found in the care plan section)  Progress towards OT goals: Progressing toward goals     Plan Discharge plan remains appropriate    Co-evaluation                 AM-PAC OT "6 Clicks" Daily Activity     Outcome Measure   Help from another person eating meals?: None Help from another person taking care of personal grooming?: A Little Help from another person toileting, which includes using toliet, bedpan, or urinal?: A Little Help from another person bathing (including washing, rinsing, drying)?: A Lot Help from another person to put on and taking off regular upper body clothing?: A Little Help from another person to put on and taking off regular lower body clothing?: A Lot 6 Click Score: 17    End of Session Equipment Utilized During Treatment: Rolling walker (2 wheels)  OT Visit Diagnosis: Other symptoms and signs involving the nervous system (R29.898);Muscle weakness (generalized) (M62.81)   Activity Tolerance Patient tolerated treatment well   Patient Left in chair;with call bell/phone within reach;with chair alarm set   Nurse Communication Mobility status        Time: 3818-2993 OT Time Calculation (min): 16 min  Charges: OT General Charges $OT Visit: 1 Visit OT Treatments $Self Care/Home Management : 8-22 mins  Rennie Plowman, MS Acute Rehabilitation Department Office# [phone]   Marcellina Millin 09/24/2022, 4:36 PM

## 2022-09-24 NOTE — Progress Notes (Addendum)
[facility]                                                                               [name] [name], is a 76 y.o. female, DOB - 1946/01/12, VEL:[id number] Admit date - 09/18/2022    Outpatient Primary MD for the patient is [name] Manes, MD  LOS - 5  days    Brief summary   [name] [name] is a 76 y.o. female with medical history significant for bipolar disorder, generalized anxiety disorder, tardive dyskinesia, CKD 3A, hypertension, and OSA on CPAP who presents emergency department with generalized weakness, falls, and poor appetite.    So far work up has been negative for infection.  Pt reports worsening falls in the last few days. She initially reported that she did not loose consciousness but yesterday she reports she is not sure , and she doesn't remember the fall She had extensive work up in the past with loop recorder placement.  and no arrhythmias found at the time.   She underwent CT angio of the head and MRI brain, negative for stroke.  Work up for syncope started and echocardiogram ordered.    Assessment & Plan    Assessment and Plan:   Generalized weakness and recurrent falls:  Unclear etiology. Could be secondary to her tardive dyskinesia, peripheral neuropathy or secondary to her meds .  Initial CTA ad MRI brain are negative for acute stroke or  anything that  would explain her recurrent falls.  On 09/23/22, she reports she is not sure if she lost consciousness, . Her history is unreliable.  She reports having a loop recorder placed on 02/2022 . EP requested to interrogate the device.   Will get echocardiogram for further evaluation as she reports she might have lost consciousness.  Therapy eval recommending SNF.  TSH wnl. Vit b12 and folate wnl.  Urine cultures multiple species present. She is asymptomatic.  If the loop does not show any arrhythmias she can be discharged in the morning to [facility]. [facility] cannot accept her today.    Bipolar  disorder.  Resume home  meds.  Resume xanax.    Hypertension Continue with norvasc 5 mg daily.  Will add hydralazine prn.     Generalized anxiety disorder;  Resume xanax.  Nausea and anxiety improved.  Encouraged her to ask for xanax as it is ordered prn.    Stage 3a CKD Creatinine is stable for 1.2    OSA on CPAP.  She is refusing CPAP .   Constipation:  Resolved, had 1 bm today.   Right lower extremity bruising and tenderness, suspect from the recurrent falls,  She is concerned about DVT,  Venous dupleX of the lower extremity ordered and is negative for DVT.    Estimated body mass index is 36.4 kg/m as calculated from the following:   Height as of this encounter: '5\' 3"'$  (1.6 m).   Weight as of this encounter: 93.2 kg.  Code Status: DNR DVT Prophylaxis:  heparin injection 5,000 Units Start: 09/19/22 0600   Level of Care: Level of care: Med-Surg Family Communication: none at bedside.   Disposition Plan:     Remains inpatient appropriate:  SNF placement. Echocardiogram.   Procedures:  MRI brain CTA negative.   Consultants:   EP eval.   Antimicrobials:   Anti-infectives (From admission, onward)    Start     Dose/Rate Route Frequency Ordered Stop   09/19/22 0000  cefTRIAXone (ROCEPHIN) 1 g in sodium chloride 0.9 % 100 mL IVPB        1 g 200 mL/hr over 30 Minutes Intravenous  Once 09/18/22 2346 09/19/22 0219        Medications  Scheduled Meds:  amLODipine  5 mg Oral Daily   atorvastatin  20 mg Oral QHS   buPROPion  150 mg Oral Daily   colestipol  2 g Oral BID   heparin  5,000 Units Subcutaneous Q8H   lamoTRIgine  200 mg Oral BID   sertraline  100 mg Oral BID   sodium chloride flush  3 mL Intravenous Q12H   valbenazine  40 mg Oral QHS   Continuous Infusions: PRN Meds:.acetaminophen **OR** acetaminophen, ALPRAZolam, hydrALAZINE, ondansetron **OR** ondansetron (ZOFRAN) IV, senna-docusate    Subjective:   [name] was seen and examined  today.  No new complaints.   Objective:   Vitals:   09/23/22 2229 09/23/22 2230 09/24/22 0500 09/24/22 0555  BP: (!) 152/106 (!) 147/90  (!) 154/76  Pulse: 62 60  63  Resp: 18   18  Temp: 98.4 F (36.9 C)   98.2 F (36.8 C)  TempSrc: Oral   Oral  SpO2: 96% 94%  96%  Weight:   93.5 kg 93.2 kg  Height:        Intake/Output Summary (Last 24 hours) at 09/24/2022 1304 Last data filed at 09/24/2022 1000 Gross per 24 hour  Intake 640 ml  Output --  Net 640 ml    Filed Weights   09/22/22 0500 09/24/22 0500 09/24/22 0555  Weight: 96.2 kg 93.5 kg 93.2 kg     Exam General exam: Appears calm and comfortable  Respiratory system: Clear to auscultation. Respiratory effort normal. Cardiovascular system: S1 & S2 heard, RRR. No JVD,  Gastrointestinal system: Abdomen is nondistended, soft and nontender.  Central nervous system: Alert and oriented. No focal neurological deficits. Extremities: bruising and tenderness, slight swelling of the right lower extremity.  Skin: bruising over the right leg from the fall.  Psychiatry:  Mood & affect appropriate.       Data Reviewed:  I have personally reviewed following labs and imaging studies   CBC Lab Results  Component Value Date   WBC 7.3 09/21/2022   RBC 3.76 (L) 09/21/2022   HGB 12.9 09/21/2022   HCT 37.8 09/21/2022   MCV 100.5 (H) 09/21/2022   MCH 34.3 (H) 09/21/2022   PLT 156 09/21/2022   MCHC 34.1 09/21/2022   RDW 12.5 09/21/2022   LYMPHSABS 0.7 10/31/2020   MONOABS 0.5 10/31/2020   EOSABS 0.2 10/31/2020   BASOSABS 0.0 78/24/2353     Last metabolic panel Lab Results  Component Value Date   NA 138 09/21/2022   K 4.2 09/21/2022   CL 106 09/21/2022   CO2 22 09/21/2022   BUN 19 09/21/2022   CREATININE 1.20 (H) 09/21/2022   GLUCOSE 99 09/21/2022   GFRNONAA 47 (L) 09/21/2022   GFRAA 47 (L) 12/06/2019   CALCIUM 9.5 09/21/2022   PROT 7.1 09/18/2022   ALBUMIN 3.9 09/18/2022   LABGLOB 2.9 01/05/2020   BILITOT 0.6  09/18/2022   ALKPHOS 53 09/18/2022   AST 21 09/18/2022   ALT 15 09/18/2022  ANIONGAP 10 09/21/2022    CBG (last 3)  No results for input(s): "GLUCAP" in the last 72 hours.     Coagulation Profile: No results for input(s): "INR", "PROTIME" in the last 168 hours.   Radiology Studies: VAS [place] LOWER EXTREMITY VENOUS (DVT)  Result Date: 09/23/2022  Lower Venous DVT Study Patient Name:  RASHEEDA MULVEHILL  Date of Exam:   09/23/2022 Medical Rec #: [id number]       Accession #:    [id number] Date of Birth: [birth year]       Patient Gender: F Patient Age:   70 years Exam Location:  [facility] Procedure:      VAS [place] LOWER EXTREMITY VENOUS (DVT) Referring Phys: Hosie Poisson --------------------------------------------------------------------------------  Indications: Swelling.  Risk Factors: None identified. Limitations: Poor ultrasound/tissue interface. Comparison Study: No prior studies. Performing Technologist: Oliver Hum RVT  Examination Guidelines: A complete evaluation includes B-mode imaging, spectral Doppler, color Doppler, and power Doppler as needed of all accessible portions of each vessel. Bilateral testing is considered an integral part of a complete examination. Limited examinations for reoccurring indications may be performed as noted. The reflux portion of the exam is performed with the patient in reverse Trendelenburg.  +---------+---------------+---------+-----------+----------+--------------+ RIGHT    CompressibilityPhasicitySpontaneityPropertiesThrombus Aging +---------+---------------+---------+-----------+----------+--------------+ CFV      Full           Yes      Yes                                 +---------+---------------+---------+-----------+----------+--------------+ SFJ      Full                                                        +---------+---------------+---------+-----------+----------+--------------+ FV Prox  Full                                                         +---------+---------------+---------+-----------+----------+--------------+ FV Mid   Full                                                        +---------+---------------+---------+-----------+----------+--------------+ FV DistalFull                                                        +---------+---------------+---------+-----------+----------+--------------+ PFV      Full                                                        +---------+---------------+---------+-----------+----------+--------------+ POP      Full           Yes  Yes                                 +---------+---------------+---------+-----------+----------+--------------+ PTV      Full                                                        +---------+---------------+---------+-----------+----------+--------------+ PERO     Full                                                        +---------+---------------+---------+-----------+----------+--------------+   +----+---------------+---------+-----------+----------+--------------+ LEFTCompressibilityPhasicitySpontaneityPropertiesThrombus Aging +----+---------------+---------+-----------+----------+--------------+ CFV Full           Yes      Yes                                 +----+---------------+---------+-----------+----------+--------------+     Summary: RIGHT: - There is no evidence of deep vein thrombosis in the lower extremity.  - No cystic structure found in the popliteal fossa.  LEFT: - No evidence of common femoral vein obstruction.  *See table(s) above for measurements and observations. Electronically signed by Servando Snare MD on 09/23/2022 at 6:03:45 PM.    Final        Hosie Poisson M.D. [facility] 09/24/2022, 1:04 PM  Available via Epic secure chat 7am-7pm After 7 pm, please refer to night coverage provider listed on amion.

## 2022-09-25 DIAGNOSIS — I10 Essential (primary) hypertension: Secondary | ICD-10-CM | POA: Diagnosis not present

## 2022-09-25 DIAGNOSIS — R531 Weakness: Secondary | ICD-10-CM | POA: Diagnosis not present

## 2022-09-25 DIAGNOSIS — R296 Repeated falls: Secondary | ICD-10-CM

## 2022-09-25 DIAGNOSIS — F411 Generalized anxiety disorder: Secondary | ICD-10-CM

## 2022-09-25 DIAGNOSIS — N1832 Chronic kidney disease, stage 3b: Secondary | ICD-10-CM

## 2022-09-25 NOTE — Progress Notes (Signed)
Patient continues to refuses nocturnal CPAP. Order changed to prn per RT protocol.

## 2022-09-25 NOTE — Progress Notes (Signed)
PROGRESS NOTE    [name] [name]  HER:[id number] DOB: 11/08/1946 DOA: 09/18/2022 PCP: [name], MD    Chief Complaint  Patient presents with   weakness    Brief Narrative:  [name] [name] is a 76 y.o. female with medical history significant for bipolar disorder, generalized anxiety disorder, tardive dyskinesia, CKD 3A, hypertension, and OSA on CPAP who presents emergency department with generalized weakness, falls, and poor appetite.    So far work up has been negative for infection.  Pt reports worsening falls in the last few days. She initially reported that she did not loose consciousness but yesterday she reports she is not sure , and she doesn't remember the fall She had extensive work up in the past with loop recorder placement.  and no arrhythmias found at the time.   She underwent CT angio of the head and MRI brain, negative for stroke.  Work up for syncope started and echocardiogram ordered.    Assessment & Plan:   Principal Problem:   General weakness Active Problems:   Essential hypertension   Bipolar disorder (HCC)   Stage 3b chronic kidney disease (CKD) (HCC)   AKI (acute kidney injury) (HCC)   GAD (generalized anxiety disorder)   Recurrent falls   Chronic renal failure, stage 3b (HCC)   Generalized anxiety disorder   Hypertension   Repeated falls  #1 generalized weakness and recurrent falls -??  Etiology.  Felt could likely be secondary to her tardive dyskinesia, peripheral neuropathy secondary to medications. -Per Dr.Akula patient reported not sure whether she lost consciousness in the fall, history noted to be unreliable. -CT angio head, MRI negative for any acute stroke or LVO which would explain her recurrent falls. -Patient is status post loop recorder placement 02/2022. -EP was requested to interrogate the device, device interrogated and no significant arrhythmias or bradycardic events or pauses noted. -2D echo done with EF of 60 to 65%,NWMA, no  significant valvular abnormalities. -TSH within normal limits, vitamin B12 and folate within normal limits. -Urine cultures with multiple species, patient asymptomatic. -Patient currently stable. -Patient assessed by PT recommending SNF placement.  2.  Bipolar disorder -Stable. -Continue home regimen Wellbutrin XL, Lamictal, Zoloft.  3.  Tardive dyskinesia -Continue home regimen Ingrezza.  4.  Hypertension -Continue Norvasc, may need to increase to 10 mg daily for better blood pressure control.. -Hydralazine as needed.  5.  CKD stage IIIb -Stable.  6.  Generalized anxiety disorder -Improved. -Continue Xanax as needed.   DVT prophylaxis: Heparin Code Status: DNR Family Communication: Updated patient.  No family at bedside. Disposition: Awaiting SNF placement  Status is: Inpatient Remains inpatient appropriate because: Unsafe disposition   Consultants:  Palliative care: Dr. Rowe Pavy 09/21/2022  Procedures:  CT angiogram head 09/19/2022 MRI brain 09/19/2022 2D echo 09/24/2022 Bilateral lower extremity Dopplers 09/23/2022  Antimicrobials:  Anti-infectives (From admission, onward)    Start     Dose/Rate Route Frequency Ordered Stop   09/19/22 0000  cefTRIAXone (ROCEPHIN) 1 g in sodium chloride 0.9 % 100 mL IVPB        1 g 200 mL/hr over 30 Minutes Intravenous  Once 09/18/22 2346 09/19/22 0219         Subjective: Patient sitting up in chair.  She is eating her lunch.  No chest pain.  No shortness of breath.  No abdominal pain.  Asking if she can speak to she wants to go to a different SNF.  Objective: Vitals:   09/24/22 2016 09/25/22 0530 09/25/22  1014 09/25/22 1429  BP: (!) 169/91 (!) 145/88 (!) 147/83 (!) 157/87  Pulse: 63 65 63 62  Resp: 16 16    Temp: 98 F (36.7 C) 98.2 F (36.8 C)  98.3 F (36.8 C)  TempSrc: Oral Oral  Oral  SpO2: 97% 100%  97%  Weight:      Height:        Intake/Output Summary (Last 24 hours) at 09/25/2022 1851 Last data filed at  09/25/2022 1300 Gross per 24 hour  Intake 603 ml  Output --  Net 603 ml   Filed Weights   09/22/22 0500 09/24/22 0500 09/24/22 0555  Weight: 96.2 kg 93.5 kg 93.2 kg    Examination:  General exam: NAD. Respiratory system: Clear to auscultation.  No wheezes, no crackles, no rhonchi.  Respiratory effort normal. Cardiovascular system: S1 & S2 heard, RRR. No JVD, murmurs, rubs, gallops or clicks. No pedal edema. Gastrointestinal system: Abdomen is nondistended, soft and nontender. No organomegaly or masses felt. Normal bowel sounds heard. Central nervous system: Alert and oriented. No focal neurological deficits. Extremities: Symmetric 5 x 5 power. Skin: No rashes, lesions or ulcers Psychiatry: Judgement and insight appear normal. Mood & affect appropriate.     Data Reviewed: I have personally reviewed following labs and imaging studies  CBC: Recent Labs  Lab 09/19/22 0500 09/20/22 0344 09/21/22 0349  WBC 8.0 5.7 7.3  HGB 13.2 12.8 12.9  HCT 39.7 38.5 37.8  MCV 102.1* 101.3* 100.5*  PLT 173 147* 324    Basic Metabolic Panel: Recent Labs  Lab 09/18/22 2233 09/19/22 0500 09/20/22 0344 09/21/22 0349  NA  --  137 138 138  K  --  4.4 3.9 4.2  CL  --  103 104 106  CO2  --  '23 25 22  '$ GLUCOSE  --  80 99 99  BUN  --  27* 21 19  CREATININE  --  1.42* 1.40* 1.20*  CALCIUM  --  9.3 8.9 9.5  MG 2.1  --   --   --     GFR: Estimated Creatinine Clearance: 43.3 mL/min (A) (by C-G formula based on SCr of 1.2 mg/dL (H)).  Liver Function Tests: No results for input(s): "AST", "ALT", "ALKPHOS", "BILITOT", "PROT", "ALBUMIN" in the last 168 hours.  CBG: No results for input(s): "GLUCAP" in the last 168 hours.   Recent Results (from the past 240 hour(s))  Urine Culture     Status: Abnormal   Collection Time: 09/19/22  8:14 AM   Specimen: Urine, Clean Catch  Result Value Ref Range Status   Specimen Description   Final    URINE, CLEAN CATCH Performed at [facility], [place] [address], [place], [place] [address]    Special Requests   Final    NONE Performed at [facility], [place] [address] [place], [place] [address]    Culture MULTIPLE SPECIES PRESENT, SUGGEST RECOLLECTION (A)  Final   Report Status 09/20/2022 FINAL  Final         Radiology Studies: ECHOCARDIOGRAM COMPLETE  Result Date: 09/24/2022    ECHOCARDIOGRAM REPORT   Patient Name:   AZRA ABRELL Date of Exam: 09/24/2022 Medical Rec #:  [id number]      Height:       63.0 in Accession #:    [id number]     Weight:       205.5 lb Date of Birth:  [birth year]      BSA:  1.956 m Patient Age:    59 years       BP:           154/76 mmHg Patient Gender: F              HR:           68 bpm. Exam Location:  Inpatient Procedure: 2D Echo Indications:    syncope  History:        Patient has prior history of Echocardiogram examinations, most                 recent 11/01/2020. Risk Factors:Hypertension, Diabetes and                 Dyslipidemia.  Sonographer:    Harvie Junior Referring Phys: Theola Sequin  Sonographer Comments: Technically difficult study due to poor echo windows. Image acquisition challenging due to patient body habitus. IMPRESSIONS  1. Left ventricular ejection fraction, by estimation, is 60 to 65%. The left ventricle has normal function. The left ventricle has no regional wall motion abnormalities. Left ventricular diastolic parameters were normal.  2. Right ventricular systolic function is normal. The right ventricular size is normal. There is normal pulmonary artery systolic pressure.  3. The mitral valve is normal in structure. No evidence of mitral valve regurgitation. No evidence of mitral stenosis.  4. The aortic valve was not well visualized. There is mild calcification of the aortic valve. Aortic valve regurgitation is not visualized. Aortic valve sclerosis/calcification is present, without any evidence of aortic stenosis.  5. The inferior vena cava is  normal in size with greater than 50% respiratory variability, suggesting right atrial pressure of 3 mmHg. FINDINGS  Left Ventricle: Left ventricular ejection fraction, by estimation, is 60 to 65%. The left ventricle has normal function. The left ventricle has no regional wall motion abnormalities. The left ventricular internal cavity size was normal in size. There is  no left ventricular hypertrophy. Left ventricular diastolic parameters were normal. Right Ventricle: The right ventricular size is normal. No increase in right ventricular wall thickness. Right ventricular systolic function is normal. There is normal pulmonary artery systolic pressure. The tricuspid regurgitant velocity is 1.63 m/s, and  with an assumed right atrial pressure of 3 mmHg, the estimated right ventricular systolic pressure is 97.6 mmHg. Left Atrium: Left atrial size was normal in size. Right Atrium: Right atrial size was normal in size. Pericardium: There is no evidence of pericardial effusion. Mitral Valve: The mitral valve is normal in structure. No evidence of mitral valve regurgitation. No evidence of mitral valve stenosis. Tricuspid Valve: The tricuspid valve is normal in structure. Tricuspid valve regurgitation is not demonstrated. No evidence of tricuspid stenosis. Aortic Valve: The aortic valve was not well visualized. There is mild calcification of the aortic valve. Aortic valve regurgitation is not visualized. Aortic valve sclerosis/calcification is present, without any evidence of aortic stenosis. Aortic valve mean gradient measures 12.0 mmHg. Aortic valve peak gradient measures 18.7 mmHg. Aortic valve area, by VTI measures 2.03 cm. Pulmonic Valve: The pulmonic valve was normal in structure. Pulmonic valve regurgitation is not visualized. No evidence of pulmonic stenosis. Aorta: The aortic root is normal in size and structure. Venous: The inferior vena cava is normal in size with greater than 50% respiratory variability,  suggesting right atrial pressure of 3 mmHg. IAS/Shunts: No atrial level shunt detected by color flow Doppler.  LEFT VENTRICLE PLAX 2D LVIDd:         3.30 cm  Diastology LVIDs:         2.40 cm      LV e' medial:    8.16 cm/s LV PW:         1.00 cm      LV E/e' medial:  11.8 LV IVS:        1.20 cm      LV e' lateral:   11.50 cm/s LVOT diam:     2.00 cm      LV E/e' lateral: 8.4 LV SV:         100 LV SV Index:   51 LVOT Area:     3.14 cm  LV Volumes (MOD) LV vol d, MOD A2C: 107.0 ml LV vol d, MOD A4C: 73.6 ml LV vol s, MOD A2C: 44.2 ml LV vol s, MOD A4C: 23.0 ml LV SV MOD A2C:     62.8 ml LV SV MOD A4C:     73.6 ml LV SV MOD BP:      58.7 ml RIGHT VENTRICLE RV Basal diam:  3.30 cm RV Mid diam:    3.20 cm TAPSE (M-mode): 2.6 cm LEFT ATRIUM             Index        RIGHT ATRIUM           Index LA diam:        3.10 cm 1.58 cm/m   RA Area:     12.10 cm LA Vol (A2C):   51.0 ml 26.07 ml/m  RA Volume:   29.20 ml  14.93 ml/m LA Vol (A4C):   39.9 ml 20.40 ml/m LA Biplane Vol: 45.4 ml 23.21 ml/m  AORTIC VALVE                     PULMONIC VALVE AV Area (Vmax):    1.96 cm      PV Vmax:       0.77 m/s AV Area (Vmean):   1.76 cm      PV Peak grad:  2.4 mmHg AV Area (VTI):     2.03 cm AV Vmax:           216.00 cm/s AV Vmean:          166.000 cm/s AV VTI:            0.491 m AV Peak Grad:      18.7 mmHg AV Mean Grad:      12.0 mmHg LVOT Vmax:         135.00 cm/s LVOT Vmean:        93.200 cm/s LVOT VTI:          0.317 m LVOT/AV VTI ratio: 0.65  AORTA Ao Root diam: 3.50 cm Ao Asc diam:  3.60 cm MITRAL VALVE                TRICUSPID VALVE MV Area (PHT): 2.60 cm     TR Peak grad:   10.6 mmHg MV Decel Time: 292 msec     TR Vmax:        163.00 cm/s MR Peak grad: 64.6 mmHg MR Vmax:      402.00 cm/s   SHUNTS MV E velocity: 96.10 cm/s   Systemic VTI:  0.32 m MV A velocity: 114.00 cm/s  Systemic Diam: 2.00 cm MV E/A ratio:  0.84 Mihai Croitoru MD Electronically signed by Sanda Klein MD Signature Date/Time: 09/24/2022/1:28:00 PM     Final  Scheduled Meds:  amLODipine  5 mg Oral Daily   atorvastatin  20 mg Oral QHS   buPROPion  150 mg Oral Daily   colestipol  2 g Oral BID   heparin  5,000 Units Subcutaneous Q8H   lamoTRIgine  200 mg Oral BID   sertraline  100 mg Oral BID   sodium chloride flush  3 mL Intravenous Q12H   valbenazine  40 mg Oral QHS   Continuous Infusions:   LOS: 6 days    Time spent: 35 minutes    Irine Seal, MD [facility]   To contact the attending provider between 7A-7P or the covering provider during after hours 7P-7A, please log into the web site [email or web address] and access using universal [place] password for that web site. If you do not have the password, please call the hospital operator.  09/25/2022, 6:51 PM

## 2022-09-25 NOTE — Progress Notes (Signed)
Physical Therapy Treatment Patient Details Name: [name] [name] MRN: [id number] DOB: 1946/04/12 Today's Date: 09/25/2022   History of Present Illness STAYSHA TRUBY is a 76 y.o. female with medical history significant for bipolar disorder, generalized anxiety disorder, tardive dyskinesia, CKD 3A, hypertension, lymphedema, and OSA on CPAP, syncopal episodes(on loop recorder) who presents emergency department 09/18/22 with generalized weakness, falls, and poor appetite.Head CT is negative for acute intracranial abnormality and CTA head is a normal study.    PT Comments    Pt continues to progress with PT. Incr amb distance/tolerance to activity today.  Plan is for SNF for continued Rehab. Continue to follow in acute setting   Recommendations for follow up therapy are one component of a multi-disciplinary discharge planning process, led by the attending physician.  Recommendations may be updated based on patient status, additional functional criteria and insurance authorization.  Follow Up Recommendations  Skilled nursing-short term rehab (<3 hours/day) Can patient physically be transported by private vehicle: Yes   Assistance Recommended at Discharge Intermittent Supervision/Assistance  Patient can return home with the following A little help with walking and/or transfers;A little help with bathing/dressing/bathroom;Assist for transportation;Help with stairs or ramp for entrance   Equipment Recommendations  None recommended by PT    Recommendations for Other Services       Precautions / Restrictions Precautions Precautions: Fall Restrictions Weight Bearing Restrictions: No     Mobility  Bed Mobility Overal bed mobility: Modified Independent       Supine to sit: HOB elevated     General bed mobility comments: HOB elevated, no physical assist    Transfers Overall transfer level: Needs assistance Equipment used: Rolling walker (2 wheels) Transfers: Sit to/from Stand Sit to  Stand: Min guard           General transfer comment: min/guard for safety, cues for hand placement on descent    Ambulation/Gait Ambulation/Gait assistance: Min guard, Supervision Gait Distance (Feet): 180 Feet Assistive device: Rolling walker (2 wheels) Gait Pattern/deviations: Step-through pattern, Wide base of support       General Gait Details: overall steady gait without LOB. cues for posture, RW position (mostly uses rollator at baseline)   Marine scientist Rankin (Stroke Patients Only)       Balance   Sitting-balance support: No upper extremity supported, Feet supported Sitting balance-Leahy Scale: Good     Standing balance support: Bilateral upper extremity supported, During functional activity, Reliant on assistive device for balance, No upper extremity supported Standing balance-Leahy Scale: Fair Standing balance comment: able to static stand without UE support, reliant on UEs for dynamic tasks                            Cognition Arousal/Alertness: Awake/alert Behavior During Therapy: WFL for tasks assessed/performed Overall Cognitive Status: Within Functional Limits for tasks assessed                                          Exercises      General Comments        Pertinent Vitals/Pain Pain Assessment Pain Assessment: No/denies pain    Home Living  Prior Function            PT Goals (current goals can now be found in the care plan section) Acute Rehab PT Goals Patient Stated Goal: to be able to walk again PT Goal Formulation: With patient Time For Goal Achievement: 10/03/22 Potential to Achieve Goals: Good Progress towards PT goals: Progressing toward goals    Frequency    Min 2X/week      PT Plan Current plan remains appropriate    Co-evaluation              AM-PAC PT "6 Clicks" Mobility   Outcome Measure  Help  needed turning from your back to your side while in a flat bed without using bedrails?: A Little Help needed moving from lying on your back to sitting on the side of a flat bed without using bedrails?: A Little Help needed moving to and from a bed to a chair (including a wheelchair)?: A Little Help needed standing up from a chair using your arms (e.g., wheelchair or bedside chair)?: A Little Help needed to walk in hospital room?: A Little Help needed climbing 3-5 steps with a railing? : A Little 6 Click Score: 18    End of Session Equipment Utilized During Treatment: Gait belt Activity Tolerance: Patient tolerated treatment well Patient left: in chair;with call bell/phone within reach;with chair alarm set;with family/visitor present Nurse Communication: Mobility status PT Visit Diagnosis: Unsteadiness on feet (R26.81);Repeated falls (R29.6);Other symptoms and signs involving the nervous system (R29.898);Difficulty in walking, not elsewhere classified (R26.2)     Time: 3151-7616 PT Time Calculation (min) (ACUTE ONLY): 15 min  Charges:  $Gait Training: 8-22 mins                     Baxter Flattery, PT  Acute Rehab Dept [facility]) [phone]  [facility] Weekend Pager [facility] only)  [phone]  09/25/2022    [facility] 09/25/2022, 11:17 AM

## 2022-09-25 NOTE — TOC Progression Note (Signed)
Transition of Care [facility]) - Progression Note    Patient Details  Name: [name] [name] MRN: [id number] Date of Birth: [birth year]  [facility]) CM/SW Contact  Lennart Pall, LCSW Phone Number: 09/25/2022, 2:21 PM  Clinical Narrative:     Was informed by Helene Kelp that pt's spouse visited facility yesterday and declined the bed, however, was not informed of this by pt or spouse.  Left VM for spouse and met with pt who confirms they no longer want [facility] and have changed SNF choice to [facility]. Able to confirm with Miquel Dunn that they do still have a bed they can offer, however, cannot admit until tomorrow.  Have alerted RN and MD.  Expected Discharge Plan: Skilled Nursing Facility Barriers to Discharge: Continued Medical Work up, [place] [facility]), SNF Pending bed offer  Expected Discharge Plan and Services Expected Discharge Plan: [place] In-house Referral: Clinical Social Work   Post Acute Care Choice: [place] Living arrangements for the past 2 months: Single Family Home Expected Discharge Date: 09/24/22               DME Arranged: N/A DME Agency: NA                   Social Determinants of Health (SDOH) Interventions    Readmission Risk Interventions     No data to display

## 2022-09-26 DIAGNOSIS — F3181 Bipolar II disorder: Secondary | ICD-10-CM | POA: Diagnosis not present

## 2022-09-26 DIAGNOSIS — F4320 Adjustment disorder, unspecified: Secondary | ICD-10-CM | POA: Diagnosis not present

## 2022-09-26 DIAGNOSIS — R059 Cough, unspecified: Secondary | ICD-10-CM | POA: Diagnosis not present

## 2022-09-26 DIAGNOSIS — R0981 Nasal congestion: Secondary | ICD-10-CM | POA: Diagnosis not present

## 2022-09-26 DIAGNOSIS — R110 Nausea: Secondary | ICD-10-CM | POA: Diagnosis not present

## 2022-09-26 DIAGNOSIS — F411 Generalized anxiety disorder: Secondary | ICD-10-CM | POA: Diagnosis not present

## 2022-09-26 DIAGNOSIS — R2681 Unsteadiness on feet: Secondary | ICD-10-CM

## 2022-09-26 DIAGNOSIS — Z8616 Personal history of COVID-19: Secondary | ICD-10-CM | POA: Diagnosis not present

## 2022-09-26 DIAGNOSIS — I10 Essential (primary) hypertension: Secondary | ICD-10-CM | POA: Diagnosis not present

## 2022-09-26 DIAGNOSIS — Z7401 Bed confinement status: Secondary | ICD-10-CM | POA: Diagnosis not present

## 2022-09-26 DIAGNOSIS — I129 Hypertensive chronic kidney disease with stage 1 through stage 4 chronic kidney disease, or unspecified chronic kidney disease: Secondary | ICD-10-CM | POA: Diagnosis not present

## 2022-09-26 DIAGNOSIS — G2401 Drug induced subacute dyskinesia: Secondary | ICD-10-CM | POA: Diagnosis not present

## 2022-09-26 DIAGNOSIS — D509 Iron deficiency anemia, unspecified: Secondary | ICD-10-CM | POA: Diagnosis not present

## 2022-09-26 DIAGNOSIS — M7989 Other specified soft tissue disorders: Secondary | ICD-10-CM | POA: Diagnosis not present

## 2022-09-26 DIAGNOSIS — R051 Acute cough: Secondary | ICD-10-CM | POA: Diagnosis not present

## 2022-09-26 DIAGNOSIS — R278 Other lack of coordination: Secondary | ICD-10-CM | POA: Diagnosis not present

## 2022-09-26 DIAGNOSIS — N1832 Chronic kidney disease, stage 3b: Secondary | ICD-10-CM | POA: Diagnosis not present

## 2022-09-26 DIAGNOSIS — M6281 Muscle weakness (generalized): Secondary | ICD-10-CM | POA: Diagnosis not present

## 2022-09-26 DIAGNOSIS — E119 Type 2 diabetes mellitus without complications: Secondary | ICD-10-CM | POA: Diagnosis not present

## 2022-09-26 DIAGNOSIS — U071 COVID-19: Secondary | ICD-10-CM | POA: Diagnosis not present

## 2022-09-26 DIAGNOSIS — R600 Localized edema: Secondary | ICD-10-CM | POA: Diagnosis not present

## 2022-09-26 DIAGNOSIS — F32A Depression, unspecified: Secondary | ICD-10-CM | POA: Diagnosis not present

## 2022-09-26 DIAGNOSIS — R531 Weakness: Secondary | ICD-10-CM | POA: Diagnosis not present

## 2022-09-26 DIAGNOSIS — E1121 Type 2 diabetes mellitus with diabetic nephropathy: Secondary | ICD-10-CM | POA: Diagnosis not present

## 2022-09-26 DIAGNOSIS — E785 Hyperlipidemia, unspecified: Secondary | ICD-10-CM | POA: Diagnosis not present

## 2022-09-26 DIAGNOSIS — R296 Repeated falls: Secondary | ICD-10-CM | POA: Diagnosis not present

## 2022-09-26 DIAGNOSIS — R41841 Cognitive communication deficit: Secondary | ICD-10-CM | POA: Diagnosis not present

## 2022-09-26 DIAGNOSIS — D508 Other iron deficiency anemias: Secondary | ICD-10-CM | POA: Diagnosis not present

## 2022-09-26 DIAGNOSIS — F319 Bipolar disorder, unspecified: Secondary | ICD-10-CM | POA: Diagnosis not present

## 2022-09-26 DIAGNOSIS — M79604 Pain in right leg: Secondary | ICD-10-CM | POA: Diagnosis not present

## 2022-09-26 MED ORDER — ALPRAZOLAM 1 MG PO TABS: 1.0000 mg | ORAL_TABLET | Freq: Two times a day (BID) | ORAL | 0 refills | Status: DC | PRN

## 2022-09-26 MED ORDER — AMLODIPINE BESYLATE 10 MG PO TABS
10.0000 mg | ORAL_TABLET | Freq: Every day | ORAL | Status: DC
  Administered 2022-09-26: 10 mg via ORAL
  Filled 2022-09-26: qty 1

## 2022-09-26 MED ORDER — AMLODIPINE BESYLATE 10 MG PO TABS: 10.0000 mg | ORAL_TABLET | Freq: Every day | ORAL | 1 refills | Status: AC

## 2022-09-26 NOTE — Discharge Summary (Addendum)
Physician Discharge Summary  [name] [name] XAJ:[id number] DOB: [birth year] DOA: 09/18/2022  PCP: [name], MD  Admit date: 09/18/2022 Discharge date: 09/26/2022  Time spent: 60 minutes  Recommendations for Outpatient Follow-up:  Follow-up with MD at SNF.  Patient will need a basic metabolic profile done in 1 week to follow-up on electrolytes and renal function.  Patient's blood pressure need to be followed up upon for further management. Follow-up with EP, Tommye Standard on 10/16/2022 at 2:45 PM.   Discharge Diagnoses:  Principal Problem:   General weakness Active Problems:   Essential hypertension   Bipolar disorder ([place])   Stage 3b chronic kidney disease (CKD) ([place])   AKI (acute kidney injury) ([place])   GAD (generalized anxiety disorder)   Recurrent falls   Chronic renal failure, stage 3b (HCC)   Generalized anxiety disorder   Hypertension   Repeated falls   Gait instability   Discharge Condition: Stable and improved.  Diet recommendation: Heart healthy  Filed Weights   09/22/22 0500 09/24/22 0500 09/24/22 0555  Weight: 96.2 kg 93.5 kg 93.2 kg    History of present illness:  HPI per Dr. Shari Prows is a 76 y.o. female with medical history significant for bipolar disorder, generalized anxiety disorder, tardive dyskinesia, CKD 3A, hypertension, and OSA on CPAP who presents emergency department with generalized weakness, falls, and poor appetite.   Patient reports months of generalized weakness and frequent falls which has worsened significantly over the past week or so.  She has history of syncopal episodes but there has not been loss of consciousness with the recent falls.  She denies abdominal pain, flank pain, or urinary symptoms. She denies any focal numbness or weakness.  She denies fever or chills.     ED Course: Upon arrival to the ED, patient is found to be afebrile and saturating well on room air with stable blood pressure.  EKG features sinus rhythm with  PAC, RBBB, and LAFB.  Head CT is negative for acute intracranial abnormality and CTA head is a normal study.  Chemistry panel notable for creatinine of 1.45.  CBC demonstrates a macrocytosis without anemia.  BNP is slightly elevated and troponin is normal x2.  Patient was given a liter of LR and 1 g IV Rocephin in the ED.  Hospital Course:  #1 generalized weakness and recurrent falls -??  Etiology.  Felt could likely be secondary to her tardive dyskinesia, peripheral neuropathy secondary to medications. -Per Dr.Akula patient reported not sure whether she lost consciousness in the fall, history noted to be unreliable. -CT angio head, MRI negative for any acute stroke or LVO which would explain her recurrent falls. -Patient is status post loop recorder placement 02/2022. -EP was requested to interrogate the device, device interrogated and no significant arrhythmias or bradycardic events or pauses noted. -2D echo done with EF of 60 to 65%,NWMA, no significant valvular abnormalities. -TSH within normal limits, vitamin B12 and folate within normal limits. -Urine cultures with multiple species, patient asymptomatic. -Patient remained stable throughout the hospitalization, some improvement with weakness and no further recurrent falls noted. -Patient assessed by PT recommending SNF placement. -Patient will be discharged to SNF.   2.  Bipolar disorder -Stable. -Patient maintained on home regimen Wellbutrin XL, Lamictal, Zoloft.   3.  Tardive dyskinesia -Patient maintained on home regimen Ingrezza.   4.  Hypertension -Patient initially maintained on home regimen Norvasc and dose uptitrated to 10 mg daily for better blood pressure control.   -Patient also maintained  on hydralazine as needed.    5.  CKD stage IIIb -Stable.   6.  Generalized anxiety disorder -Improved. -Patient maintained on home regimen anxiolytics as needed.    Procedures: CT angiogram head 09/19/2022 MRI brain 09/19/2022 2D  echo 09/24/2022 Bilateral lower extremity Dopplers 09/23/2022  Consultations: Palliative care: Dr. Rowe Pavy 09/21/2022   Discharge Exam: Vitals:   09/26/22 0637 09/26/22 1210  BP: (!) 182/93 (!) 168/78  Pulse: (!) 57   Resp: 17   Temp: 98.4 F (36.9 C)   SpO2: 94%     General: NAD Cardiovascular: RRR no murmurs rubs or gallops.  No JVD.  No lower extremity edema. Respiratory: Clear to auscultation bilaterally.  No wheezes, no crackles, no rhonchi.  Fair air movement.  Speaking in full sentences.  No use of accessory muscles of respiration.  Discharge Instructions   Discharge Instructions     Diet - low sodium heart healthy   Complete by: As directed    Discharge instructions   Complete by: As directed    Please follow up with cardiology in one week.   Increase activity slowly   Complete by: As directed       Allergies as of 09/26/2022       Reactions   Latuda [lurasidone] Other (See Comments)   Increased suicidal thoughts    Aripiprazole Other (See Comments)   Tardive Dyskinesia Other reaction(s): tardive dyskinesia   Bacitracin-polymyxin B    Other reaction(s): rash   Losartan Potassium    Other reaction(s): decline gfr   Neo-bacit-poly-lidocaine    Quetiapine Other (See Comments)   Tardive Dyskinesia Other reaction(s): tardive dyskinesia        Medication List     STOP taking these medications    simvastatin 40 MG tablet Commonly known as: ZOCOR       TAKE these medications    ALPRAZolam 1 MG tablet Commonly known as: XANAX Take 1 tablet (1 mg total) by mouth 2 (two) times daily as needed for anxiety or sleep.   amLODipine 10 MG tablet Commonly known as: NORVASC Take 1 tablet (10 mg total) by mouth at bedtime. What changed:  medication strength how much to take   atorvastatin 20 MG tablet Commonly known as: LIPITOR Take 1 tablet (20 mg total) by mouth at bedtime.   buPROPion 150 MG 24 hr tablet Commonly known as: Wellbutrin XL Take one  tablet in the morning and one tablet at lunch. What changed:  how much to take how to take this when to take this additional instructions   colestipol 1 g tablet Commonly known as: COLESTID Take 2 g by mouth 2 (two) times daily.   lamoTRIgine 200 MG tablet Commonly known as: LAMICTAL Take 1 tablet (200 mg total) by mouth 2 (two) times daily.   multivitamin with minerals tablet Take 1 tablet by mouth daily.   senna-docusate 8.6-50 MG tablet Commonly known as: Senokot-S Take 1 tablet by mouth at bedtime as needed for mild constipation.   sertraline 100 MG tablet Commonly known as: ZOLOFT Take 1 tablet (100 mg total) by mouth 2 (two) times daily.   valbenazine 40 MG capsule Commonly known as: Ingrezza Take 1 capsule (40 mg total) by mouth at bedtime.       Allergies  Allergen Reactions   Latuda [Lurasidone] Other (See Comments)    Increased suicidal thoughts    Aripiprazole Other (See Comments)    Tardive Dyskinesia Other reaction(s): tardive dyskinesia   Bacitracin-Polymyxin B  Other reaction(s): rash   Losartan Potassium     Other reaction(s): decline gfr   Neo-Bacit-Poly-Lidocaine    Quetiapine Other (See Comments)    Tardive Dyskinesia Other reaction(s): tardive dyskinesia    Contact information for follow-up providers     MD AT SNF Follow up.          Baldwin Jamaica, PA-C Follow up on 10/16/2022.   Specialty: Cardiology Why: F/U AT 245PM Contact information: [place] [address] [phone]              Contact information for after-discharge care     Destination     [facility] Preferred SNF .   Service: Skilled Nursing Contact information: [address] [place] [phone]                      The results of significant diagnostics from this hospitalization (including imaging, microbiology, ancillary and laboratory) are listed below for reference.     Significant Diagnostic Studies: CUP PACEART REMOTE DEVICE CHECK  Result Date: 09/24/2022 ILR summary report received. Battery status OK. Normal device function. No new, tachy, brady, or pause episodes. No new AF episodes. Monthly summary reports and ROV/PRN 2 symptom activation's, no associated arrhythmia, SR with PAC's LA  ECHOCARDIOGRAM COMPLETE  Result Date: 09/24/2022    ECHOCARDIOGRAM REPORT   Patient Name:   LAMYRA MALCOLM Date of Exam: 09/24/2022 Medical Rec #:  [id number]      Height:       63.0 in Accession #:    [id number]     Weight:       205.5 lb Date of Birth:  07/03/1946      BSA:          1.956 m Patient Age:    40 years       BP:           154/76 mmHg Patient Gender: F              HR:           68 bpm. Exam Location:  Inpatient Procedure: 2D Echo Indications:    syncope  History:        Patient has prior history of Echocardiogram examinations, most                 recent 11/01/2020. Risk Factors:Hypertension, Diabetes and                 Dyslipidemia.  Sonographer:    Harvie Junior Referring Phys: Theola Sequin  Sonographer Comments: Technically difficult study due to poor echo windows. Image acquisition challenging due to patient body habitus. IMPRESSIONS  1. Left ventricular ejection fraction, by estimation, is 60 to 65%. The left ventricle has normal function. The left ventricle has no regional wall motion abnormalities. Left ventricular diastolic parameters were normal.  2. Right ventricular systolic function is normal. The right ventricular size is normal. There is normal pulmonary artery systolic pressure.  3. The mitral valve is normal in structure. No evidence of mitral valve regurgitation. No evidence of mitral stenosis.  4. The aortic valve was not well visualized. There is mild calcification of the aortic valve. Aortic valve regurgitation is not visualized. Aortic valve sclerosis/calcification is present, without any evidence of aortic stenosis.  5. The inferior vena cava is  normal in size with greater than 50% respiratory variability, suggesting right atrial pressure of 3 mmHg.  FINDINGS  Left Ventricle: Left ventricular ejection fraction, by estimation, is 60 to 65%. The left ventricle has normal function. The left ventricle has no regional wall motion abnormalities. The left ventricular internal cavity size was normal in size. There is  no left ventricular hypertrophy. Left ventricular diastolic parameters were normal. Right Ventricle: The right ventricular size is normal. No increase in right ventricular wall thickness. Right ventricular systolic function is normal. There is normal pulmonary artery systolic pressure. The tricuspid regurgitant velocity is 1.63 m/s, and  with an assumed right atrial pressure of 3 mmHg, the estimated right ventricular systolic pressure is 38.2 mmHg. Left Atrium: Left atrial size was normal in size. Right Atrium: Right atrial size was normal in size. Pericardium: There is no evidence of pericardial effusion. Mitral Valve: The mitral valve is normal in structure. No evidence of mitral valve regurgitation. No evidence of mitral valve stenosis. Tricuspid Valve: The tricuspid valve is normal in structure. Tricuspid valve regurgitation is not demonstrated. No evidence of tricuspid stenosis. Aortic Valve: The aortic valve was not well visualized. There is mild calcification of the aortic valve. Aortic valve regurgitation is not visualized. Aortic valve sclerosis/calcification is present, without any evidence of aortic stenosis. Aortic valve mean gradient measures 12.0 mmHg. Aortic valve peak gradient measures 18.7 mmHg. Aortic valve area, by VTI measures 2.03 cm. Pulmonic Valve: The pulmonic valve was normal in structure. Pulmonic valve regurgitation is not visualized. No evidence of pulmonic stenosis. Aorta: The aortic root is normal in size and structure. Venous: The inferior vena cava is normal in size with greater than 50% respiratory variability,  suggesting right atrial pressure of 3 mmHg. IAS/Shunts: No atrial level shunt detected by color flow Doppler.  LEFT VENTRICLE PLAX 2D LVIDd:         3.30 cm      Diastology LVIDs:         2.40 cm      LV e' medial:    8.16 cm/s LV PW:         1.00 cm      LV E/e' medial:  11.8 LV IVS:        1.20 cm      LV e' lateral:   11.50 cm/s LVOT diam:     2.00 cm      LV E/e' lateral: 8.4 LV SV:         100 LV SV Index:   51 LVOT Area:     3.14 cm  LV Volumes (MOD) LV vol d, MOD A2C: 107.0 ml LV vol d, MOD A4C: 73.6 ml LV vol s, MOD A2C: 44.2 ml LV vol s, MOD A4C: 23.0 ml LV SV MOD A2C:     62.8 ml LV SV MOD A4C:     73.6 ml LV SV MOD BP:      58.7 ml RIGHT VENTRICLE RV Basal diam:  3.30 cm RV Mid diam:    3.20 cm TAPSE (M-mode): 2.6 cm LEFT ATRIUM             Index        RIGHT ATRIUM           Index LA diam:        3.10 cm 1.58 cm/m   RA Area:     12.10 cm LA Vol (A2C):   51.0 ml 26.07 ml/m  RA Volume:   29.20 ml  14.93 ml/m LA Vol (A4C):   39.9 ml 20.40 ml/m LA Biplane Vol: 45.4 ml  23.21 ml/m  AORTIC VALVE                     PULMONIC VALVE AV Area (Vmax):    1.96 cm      PV Vmax:       0.77 m/s AV Area (Vmean):   1.76 cm      PV Peak grad:  2.4 mmHg AV Area (VTI):     2.03 cm AV Vmax:           216.00 cm/s AV Vmean:          166.000 cm/s AV VTI:            0.491 m AV Peak Grad:      18.7 mmHg AV Mean Grad:      12.0 mmHg LVOT Vmax:         135.00 cm/s LVOT Vmean:        93.200 cm/s LVOT VTI:          0.317 m LVOT/AV VTI ratio: 0.65  AORTA Ao Root diam: 3.50 cm Ao Asc diam:  3.60 cm MITRAL VALVE                TRICUSPID VALVE MV Area (PHT): 2.60 cm     TR Peak grad:   10.6 mmHg MV Decel Time: 292 msec     TR Vmax:        163.00 cm/s MR Peak grad: 64.6 mmHg MR Vmax:      402.00 cm/s   SHUNTS MV E velocity: 96.10 cm/s   Systemic VTI:  0.32 m MV A velocity: 114.00 cm/s  Systemic Diam: 2.00 cm MV E/A ratio:  0.84 Mihai Croitoru MD Electronically signed by Sanda Klein MD Signature Date/Time: 09/24/2022/1:28:00 PM     Final    VAS [place] LOWER EXTREMITY VENOUS (DVT)  Result Date: 09/23/2022  Lower Venous DVT Study Patient Name:  NICHOLAS OSSA  Date of Exam:   09/23/2022 Medical Rec #: [id number]       Accession #:    [id number] Date of Birth: [birth year]       Patient Gender: F Patient Age:   89 years Exam Location:  [facility] Procedure:      VAS [place] LOWER EXTREMITY VENOUS (DVT) Referring Phys: Hosie Poisson --------------------------------------------------------------------------------  Indications: Swelling.  Risk Factors: None identified. Limitations: Poor ultrasound/tissue interface. Comparison Study: No prior studies. Performing Technologist: Oliver Hum RVT  Examination Guidelines: A complete evaluation includes B-mode imaging, spectral Doppler, color Doppler, and power Doppler as needed of all accessible portions of each vessel. Bilateral testing is considered an integral part of a complete examination. Limited examinations for reoccurring indications may be performed as noted. The reflux portion of the exam is performed with the patient in reverse Trendelenburg.  +---------+---------------+---------+-----------+----------+--------------+ RIGHT    CompressibilityPhasicitySpontaneityPropertiesThrombus Aging +---------+---------------+---------+-----------+----------+--------------+ CFV      Full           Yes      Yes                                 +---------+---------------+---------+-----------+----------+--------------+ SFJ      Full                                                        +---------+---------------+---------+-----------+----------+--------------+  FV Prox  Full                                                        +---------+---------------+---------+-----------+----------+--------------+ FV Mid   Full                                                        +---------+---------------+---------+-----------+----------+--------------+ FV DistalFull                                                         +---------+---------------+---------+-----------+----------+--------------+ PFV      Full                                                        +---------+---------------+---------+-----------+----------+--------------+ POP      Full           Yes      Yes                                 +---------+---------------+---------+-----------+----------+--------------+ PTV      Full                                                        +---------+---------------+---------+-----------+----------+--------------+ PERO     Full                                                        +---------+---------------+---------+-----------+----------+--------------+   +----+---------------+---------+-----------+----------+--------------+ LEFTCompressibilityPhasicitySpontaneityPropertiesThrombus Aging +----+---------------+---------+-----------+----------+--------------+ CFV Full           Yes      Yes                                 +----+---------------+---------+-----------+----------+--------------+     Summary: RIGHT: - There is no evidence of deep vein thrombosis in the lower extremity.  - No cystic structure found in the popliteal fossa.  LEFT: - No evidence of common femoral vein obstruction.  *See table(s) above for measurements and observations. Electronically signed by Servando Snare MD on 09/23/2022 at 6:03:45 PM.    Final    MR BRAIN WO CONTRAST  Result Date: 09/19/2022 CLINICAL DATA:  Syncope/presyncope, cerebrovascular cause suspected Recurrent falls, EXAM: MRI HEAD WITHOUT CONTRAST TECHNIQUE: Multiplanar, multiecho pulse sequences of the brain and surrounding structures were obtained without intravenous contrast. COMPARISON:  MRI Head 10/31/2020. FINDINGS: Brain: No acute infarction, hemorrhage, hydrocephalus, extra-axial collection  or mass lesion. Mild for age scattered T2/FLAIR hyperintensities in the white matter, compatible  with chronic microvascular ischemic disease. Vascular: Major arterial flow voids are maintained at the skull base. Skull and upper cervical spine: Normal marrow signal. Sinuses/Orbits: Negative. Other: No sizable mastoid effusions. IMPRESSION: No evidence of acute intracranial abnormality. Electronically Signed   By: Margaretha Sheffield M.D.   On: 09/19/2022 16:03   CT ANGIO HEAD W OR WO CONTRAST  Result Date: 09/19/2022 CLINICAL DATA:  Initial evaluation for dizziness, weakness. EXAM: CT ANGIOGRAPHY HEAD TECHNIQUE: Multidetector CT imaging of the head was performed using the standard protocol during bolus administration of intravenous contrast. Multiplanar CT image reconstructions and MIPs were obtained to evaluate the vascular anatomy. RADIATION DOSE REDUCTION: This exam was performed according to the departmental dose-optimization program which includes automated exposure control, adjustment of the mA and/or kV according to patient size and/or use of iterative reconstruction technique. CONTRAST:  73m OMNIPAQUE IOHEXOL 350 MG/ML SOLN COMPARISON:  Prior study from 10/31/2020. FINDINGS: CT HEAD Brain: Cerebral volume within normal limits for age. Mild chronic microvascular ischemic disease. No acute intracranial hemorrhage. No acute large vessel territory infarct. No mass lesion, mass effect or midline shift. No hydrocephalus or extra-axial fluid collection. Vascular: No abnormal hyperdense vessel. Skull: Scalp soft tissues and calvarium within normal limits. Prominent osteoarthritic changes noted about the TMJs bilaterally. Sinuses: Globes orbital soft tissues demonstrate no acute finding. Paranasal sinuses and mastoid air cells are largely clear. Other: None. CTA HEAD Anterior circulation: Both internal carotid arteries widely patent to the termini without stenosis. Both A1 segments widely patent. Normal anterior communicating artery complex. Anterior cerebral arteries widely patent without stenosis. No M1  stenosis or occlusion. No proximal MCA branch occlusion or high-grade stenosis. Distal MCA branches perfused and symmetric. Posterior circulation: Both vertebral arteries patent without stenosis. Left vertebral artery dominant. Both PICA patent. Basilar patent to its distal aspect without stenosis. Superior cerebellar arteries patent bilaterally. Left PCA supplied via the basilar as well as a small left posterior communicating artery. Fetal type origin of the right PCA. Both PCAs patent to their distal aspects without stenosis. Venous sinuses: Patent allowing for timing the contrast bolus. Anatomic variants: As above.  No aneurysm. Review of the MIP images confirms the above findings. IMPRESSION: 1. Normal CTA of the head. No large vessel occlusion or other emergent finding. No hemodynamically significant or correctable stenosis. No aneurysm. 2. No other acute intracranial abnormality. 3. Mild chronic microvascular ischemic disease. Electronically Signed   By: BJeannine BogaM.D.   On: 09/19/2022 02:02    Microbiology: Recent Results (from the past 240 hour(s))  Urine Culture     Status: Abnormal   Collection Time: 09/19/22  8:14 AM   Specimen: Urine, Clean Catch  Result Value Ref Range Status   Specimen Description   Final    URINE, CLEAN CATCH [facility] [address], [place] [address]   Special Requests   Final    NONE [facility] [address], [place] [address]   Culture MULTIPLE SPECIES PRESENT, SUGGEST RECOLLECTION (A)  Final   Report Status 09/20/2022 FINAL  Final     Labs: Basic Metabolic Panel: Recent Labs  Lab 09/20/22 0344 09/21/22 0349  NA 138 138  K 3.9 4.2  CL 104 106  CO2 25 22  GLUCOSE 99 99  BUN 21 19  CREATININE 1.40* 1.20*  CALCIUM 8.9 9.5  Liver Function Tests: No results for input(s): "AST", "ALT", "ALKPHOS", "BILITOT", "PROT", "ALBUMIN" in the last 168  hours. No results for input(s): "LIPASE", "AMYLASE" in the last 168 hours. No results for input(s): "AMMONIA" in the last 168 hours. CBC: Recent Labs  Lab 09/20/22 0344 09/21/22 0349  WBC 5.7 7.3  HGB 12.8 12.9  HCT 38.5 37.8  MCV 101.3* 100.5*  PLT 147* 156   Cardiac Enzymes: No results for input(s): "CKTOTAL", "CKMB", "CKMBINDEX", "TROPONINI" in the last 168 hours. BNP: BNP (last 3 results) Recent Labs    09/18/22 1309  BNP 153.2*    ProBNP (last 3 results) No results for input(s): "PROBNP" in the last 8760 hours.  CBG: No results for input(s): "GLUCAP" in the last 168 hours.     Signed:  Irine Seal MD.  [facility] 09/26/2022, 1:50 PM

## 2022-09-26 NOTE — TOC Transition Note (Addendum)
Transition of Care [facility]) - CM/SW Discharge Note   Patient Details  Name: [name] [name] MRN: [id number] Date of Birth: 1946-04-26  Transition of Care [facility]) CM/SW Contact:  Henrietta Dine, RN Phone Number: 09/26/2022, 1:31 PM   Clinical Narrative:    D/C orders written; pt agreeable to d/c to  [facility] via [place]; pt will notify her husband; says she will notify her husband; notified Juliann Pulse at [facility]; she gave room # [phone] and call report # [phone]; Levada Dy, RN and Dr Grandville Silos notified; awaiting d/c summary before calling PTAR; d/c summary and SNF transfer report will be sent via hub.  - 1346 - SNF transfer report and d/c summary sent via hub; PT[facility] called at 1355; spoke with [facility]; no further TOC needs.  Final next level of care: [place] ([place]) Barriers to Discharge: No Barriers Identified   Patient Goals and CMS Choice Patient states their goals for this hospitalization and ongoing recovery are:: return home following SNF rehab   Choice offered to / list presented to : Spouse, Patient  Discharge Placement              Patient chooses bed at: [facility] Patient to be transferred to facility by: [place] Name of family member notified: Pt says she will notify her husband [name] [name] Patient and family notified of of transfer: 09/26/22  Discharge Plan and Services In-house Referral: Clinical Social Work   Post Acute Care Choice: [place]          DME Arranged: N/A DME Agency: NA                  Social Determinants of Health (SDOH) Interventions     Readmission Risk Interventions     No data to display

## 2022-09-26 NOTE — Plan of Care (Signed)
Patient discharging to [facility]. Report called to Alexia Freestone, RN at [facility]. Personal meds in sealed bag from pharmacy and placed in patient's personal carry bag. Awaiting [facility] pick-up. Ivan Anchors, RN 09/26/22 2:56 PM

## 2022-09-26 NOTE — Plan of Care (Signed)
Problem: Education: Goal: Knowledge of General Education information will improve Description: Including pain rating scale, medication(s)/side effects and non-pharmacologic comfort measures Outcome: Progressing   Problem: Clinical Measurements: Goal: Ability to maintain clinical measurements within normal limits will improve Outcome: Progressing   Problem: Activity: Goal: Risk for activity intolerance will decrease Outcome: [place], RN 09/26/22 12:28 PM

## 2022-09-27 DIAGNOSIS — F411 Generalized anxiety disorder: Secondary | ICD-10-CM | POA: Diagnosis not present

## 2022-09-27 DIAGNOSIS — N1832 Chronic kidney disease, stage 3b: Secondary | ICD-10-CM | POA: Diagnosis not present

## 2022-09-27 DIAGNOSIS — M6281 Muscle weakness (generalized): Secondary | ICD-10-CM | POA: Diagnosis not present

## 2022-09-27 DIAGNOSIS — E1121 Type 2 diabetes mellitus with diabetic nephropathy: Secondary | ICD-10-CM | POA: Diagnosis not present

## 2022-09-27 DIAGNOSIS — F319 Bipolar disorder, unspecified: Secondary | ICD-10-CM | POA: Diagnosis not present

## 2022-09-27 DIAGNOSIS — D508 Other iron deficiency anemias: Secondary | ICD-10-CM | POA: Diagnosis not present

## 2022-09-27 DIAGNOSIS — I129 Hypertensive chronic kidney disease with stage 1 through stage 4 chronic kidney disease, or unspecified chronic kidney disease: Secondary | ICD-10-CM | POA: Diagnosis not present

## 2022-09-27 DIAGNOSIS — G2401 Drug induced subacute dyskinesia: Secondary | ICD-10-CM | POA: Diagnosis not present

## 2022-09-27 DIAGNOSIS — F4320 Adjustment disorder, unspecified: Secondary | ICD-10-CM | POA: Diagnosis not present

## 2022-09-27 DIAGNOSIS — R296 Repeated falls: Secondary | ICD-10-CM | POA: Diagnosis not present

## 2022-09-30 DIAGNOSIS — M6281 Muscle weakness (generalized): Secondary | ICD-10-CM | POA: Diagnosis not present

## 2022-09-30 DIAGNOSIS — F411 Generalized anxiety disorder: Secondary | ICD-10-CM | POA: Diagnosis not present

## 2022-09-30 DIAGNOSIS — F319 Bipolar disorder, unspecified: Secondary | ICD-10-CM | POA: Diagnosis not present

## 2022-09-30 DIAGNOSIS — D508 Other iron deficiency anemias: Secondary | ICD-10-CM | POA: Diagnosis not present

## 2022-09-30 DIAGNOSIS — G2401 Drug induced subacute dyskinesia: Secondary | ICD-10-CM | POA: Diagnosis not present

## 2022-09-30 DIAGNOSIS — I129 Hypertensive chronic kidney disease with stage 1 through stage 4 chronic kidney disease, or unspecified chronic kidney disease: Secondary | ICD-10-CM | POA: Diagnosis not present

## 2022-09-30 DIAGNOSIS — N1832 Chronic kidney disease, stage 3b: Secondary | ICD-10-CM | POA: Diagnosis not present

## 2022-09-30 DIAGNOSIS — E1121 Type 2 diabetes mellitus with diabetic nephropathy: Secondary | ICD-10-CM | POA: Diagnosis not present

## 2022-09-30 DIAGNOSIS — R296 Repeated falls: Secondary | ICD-10-CM | POA: Diagnosis not present

## 2022-10-01 DIAGNOSIS — D508 Other iron deficiency anemias: Secondary | ICD-10-CM | POA: Diagnosis not present

## 2022-10-01 DIAGNOSIS — R296 Repeated falls: Secondary | ICD-10-CM | POA: Diagnosis not present

## 2022-10-01 DIAGNOSIS — I129 Hypertensive chronic kidney disease with stage 1 through stage 4 chronic kidney disease, or unspecified chronic kidney disease: Secondary | ICD-10-CM | POA: Diagnosis not present

## 2022-10-01 DIAGNOSIS — E1121 Type 2 diabetes mellitus with diabetic nephropathy: Secondary | ICD-10-CM | POA: Diagnosis not present

## 2022-10-01 DIAGNOSIS — F319 Bipolar disorder, unspecified: Secondary | ICD-10-CM | POA: Diagnosis not present

## 2022-10-01 DIAGNOSIS — I10 Essential (primary) hypertension: Secondary | ICD-10-CM | POA: Diagnosis not present

## 2022-10-01 DIAGNOSIS — G2401 Drug induced subacute dyskinesia: Secondary | ICD-10-CM | POA: Diagnosis not present

## 2022-10-01 DIAGNOSIS — F411 Generalized anxiety disorder: Secondary | ICD-10-CM | POA: Diagnosis not present

## 2022-10-01 DIAGNOSIS — M6281 Muscle weakness (generalized): Secondary | ICD-10-CM | POA: Diagnosis not present

## 2022-10-01 DIAGNOSIS — N1832 Chronic kidney disease, stage 3b: Secondary | ICD-10-CM | POA: Diagnosis not present

## 2022-10-02 DIAGNOSIS — F319 Bipolar disorder, unspecified: Secondary | ICD-10-CM | POA: Diagnosis not present

## 2022-10-02 DIAGNOSIS — R296 Repeated falls: Secondary | ICD-10-CM | POA: Diagnosis not present

## 2022-10-02 DIAGNOSIS — F411 Generalized anxiety disorder: Secondary | ICD-10-CM | POA: Diagnosis not present

## 2022-10-02 DIAGNOSIS — G2401 Drug induced subacute dyskinesia: Secondary | ICD-10-CM | POA: Diagnosis not present

## 2022-10-02 DIAGNOSIS — E785 Hyperlipidemia, unspecified: Secondary | ICD-10-CM | POA: Diagnosis not present

## 2022-10-02 DIAGNOSIS — N1832 Chronic kidney disease, stage 3b: Secondary | ICD-10-CM | POA: Diagnosis not present

## 2022-10-02 DIAGNOSIS — I10 Essential (primary) hypertension: Secondary | ICD-10-CM | POA: Diagnosis not present

## 2022-10-02 DIAGNOSIS — D509 Iron deficiency anemia, unspecified: Secondary | ICD-10-CM | POA: Diagnosis not present

## 2022-10-02 DIAGNOSIS — E119 Type 2 diabetes mellitus without complications: Secondary | ICD-10-CM | POA: Diagnosis not present

## 2022-10-04 ENCOUNTER — Other Ambulatory Visit: Payer: Self-pay | Admitting: *Deleted

## 2022-10-04 DIAGNOSIS — N1832 Chronic kidney disease, stage 3b: Secondary | ICD-10-CM | POA: Diagnosis not present

## 2022-10-04 DIAGNOSIS — E1121 Type 2 diabetes mellitus with diabetic nephropathy: Secondary | ICD-10-CM | POA: Diagnosis not present

## 2022-10-04 DIAGNOSIS — R110 Nausea: Secondary | ICD-10-CM | POA: Diagnosis not present

## 2022-10-04 DIAGNOSIS — I10 Essential (primary) hypertension: Secondary | ICD-10-CM | POA: Diagnosis not present

## 2022-10-04 DIAGNOSIS — D509 Iron deficiency anemia, unspecified: Secondary | ICD-10-CM | POA: Diagnosis not present

## 2022-10-04 DIAGNOSIS — G2401 Drug induced subacute dyskinesia: Secondary | ICD-10-CM | POA: Diagnosis not present

## 2022-10-04 DIAGNOSIS — R296 Repeated falls: Secondary | ICD-10-CM | POA: Diagnosis not present

## 2022-10-04 DIAGNOSIS — F411 Generalized anxiety disorder: Secondary | ICD-10-CM | POA: Diagnosis not present

## 2022-10-04 DIAGNOSIS — E785 Hyperlipidemia, unspecified: Secondary | ICD-10-CM | POA: Diagnosis not present

## 2022-10-04 DIAGNOSIS — F319 Bipolar disorder, unspecified: Secondary | ICD-10-CM | POA: Diagnosis not present

## 2022-10-04 DIAGNOSIS — M6281 Muscle weakness (generalized): Secondary | ICD-10-CM | POA: Diagnosis not present

## 2022-10-04 DIAGNOSIS — E119 Type 2 diabetes mellitus without complications: Secondary | ICD-10-CM | POA: Diagnosis not present

## 2022-10-04 NOTE — Patient Outreach (Signed)
[name] [name] resides in [facility]. Screening for potential care coordination services as benefit of insurance plan and PCP.  Update received from Ked[place], SNF social worker. [name] [name] is from home with spouse. Plans to return home post SNF.  PCP office [facility] [place].   Will continue to follow.    Marthenia Rolling, MSN, RN,BSN [place] Acute Care Coordinator [phone] (Direct dial)

## 2022-10-07 DIAGNOSIS — F319 Bipolar disorder, unspecified: Secondary | ICD-10-CM | POA: Diagnosis not present

## 2022-10-07 DIAGNOSIS — G2401 Drug induced subacute dyskinesia: Secondary | ICD-10-CM | POA: Diagnosis not present

## 2022-10-07 DIAGNOSIS — N1832 Chronic kidney disease, stage 3b: Secondary | ICD-10-CM | POA: Diagnosis not present

## 2022-10-07 DIAGNOSIS — F411 Generalized anxiety disorder: Secondary | ICD-10-CM | POA: Diagnosis not present

## 2022-10-07 DIAGNOSIS — F3181 Bipolar II disorder: Secondary | ICD-10-CM | POA: Diagnosis not present

## 2022-10-07 DIAGNOSIS — F32A Depression, unspecified: Secondary | ICD-10-CM | POA: Diagnosis not present

## 2022-10-07 DIAGNOSIS — R0981 Nasal congestion: Secondary | ICD-10-CM | POA: Diagnosis not present

## 2022-10-07 DIAGNOSIS — R051 Acute cough: Secondary | ICD-10-CM | POA: Diagnosis not present

## 2022-10-07 DIAGNOSIS — I129 Hypertensive chronic kidney disease with stage 1 through stage 4 chronic kidney disease, or unspecified chronic kidney disease: Secondary | ICD-10-CM | POA: Diagnosis not present

## 2022-10-07 DIAGNOSIS — F4320 Adjustment disorder, unspecified: Secondary | ICD-10-CM | POA: Diagnosis not present

## 2022-10-07 DIAGNOSIS — M6281 Muscle weakness (generalized): Secondary | ICD-10-CM | POA: Diagnosis not present

## 2022-10-07 DIAGNOSIS — R296 Repeated falls: Secondary | ICD-10-CM | POA: Diagnosis not present

## 2022-10-07 DIAGNOSIS — E1121 Type 2 diabetes mellitus with diabetic nephropathy: Secondary | ICD-10-CM | POA: Diagnosis not present

## 2022-10-07 DIAGNOSIS — D508 Other iron deficiency anemias: Secondary | ICD-10-CM | POA: Diagnosis not present

## 2022-10-08 DIAGNOSIS — F411 Generalized anxiety disorder: Secondary | ICD-10-CM | POA: Diagnosis not present

## 2022-10-08 DIAGNOSIS — R296 Repeated falls: Secondary | ICD-10-CM | POA: Diagnosis not present

## 2022-10-08 DIAGNOSIS — F319 Bipolar disorder, unspecified: Secondary | ICD-10-CM | POA: Diagnosis not present

## 2022-10-08 DIAGNOSIS — N1832 Chronic kidney disease, stage 3b: Secondary | ICD-10-CM | POA: Diagnosis not present

## 2022-10-08 DIAGNOSIS — G2401 Drug induced subacute dyskinesia: Secondary | ICD-10-CM | POA: Diagnosis not present

## 2022-10-08 DIAGNOSIS — U071 COVID-19: Secondary | ICD-10-CM | POA: Diagnosis not present

## 2022-10-08 DIAGNOSIS — D509 Iron deficiency anemia, unspecified: Secondary | ICD-10-CM | POA: Diagnosis not present

## 2022-10-08 DIAGNOSIS — E119 Type 2 diabetes mellitus without complications: Secondary | ICD-10-CM | POA: Diagnosis not present

## 2022-10-08 DIAGNOSIS — I10 Essential (primary) hypertension: Secondary | ICD-10-CM | POA: Diagnosis not present

## 2022-10-08 DIAGNOSIS — E785 Hyperlipidemia, unspecified: Secondary | ICD-10-CM | POA: Diagnosis not present

## 2022-10-11 DIAGNOSIS — E1121 Type 2 diabetes mellitus with diabetic nephropathy: Secondary | ICD-10-CM | POA: Diagnosis not present

## 2022-10-11 DIAGNOSIS — Z8616 Personal history of COVID-19: Secondary | ICD-10-CM | POA: Diagnosis not present

## 2022-10-11 DIAGNOSIS — I129 Hypertensive chronic kidney disease with stage 1 through stage 4 chronic kidney disease, or unspecified chronic kidney disease: Secondary | ICD-10-CM | POA: Diagnosis not present

## 2022-10-11 DIAGNOSIS — D508 Other iron deficiency anemias: Secondary | ICD-10-CM | POA: Diagnosis not present

## 2022-10-11 DIAGNOSIS — M6281 Muscle weakness (generalized): Secondary | ICD-10-CM | POA: Diagnosis not present

## 2022-10-11 DIAGNOSIS — G2401 Drug induced subacute dyskinesia: Secondary | ICD-10-CM | POA: Diagnosis not present

## 2022-10-11 DIAGNOSIS — R296 Repeated falls: Secondary | ICD-10-CM | POA: Diagnosis not present

## 2022-10-11 DIAGNOSIS — R051 Acute cough: Secondary | ICD-10-CM | POA: Diagnosis not present

## 2022-10-11 DIAGNOSIS — R0981 Nasal congestion: Secondary | ICD-10-CM | POA: Diagnosis not present

## 2022-10-11 DIAGNOSIS — N1832 Chronic kidney disease, stage 3b: Secondary | ICD-10-CM | POA: Diagnosis not present

## 2022-10-11 DIAGNOSIS — F411 Generalized anxiety disorder: Secondary | ICD-10-CM | POA: Diagnosis not present

## 2022-10-11 DIAGNOSIS — F319 Bipolar disorder, unspecified: Secondary | ICD-10-CM | POA: Diagnosis not present

## 2022-10-15 ENCOUNTER — Other Ambulatory Visit: Payer: Self-pay | Admitting: *Deleted

## 2022-10-15 DIAGNOSIS — D509 Iron deficiency anemia, unspecified: Secondary | ICD-10-CM | POA: Diagnosis not present

## 2022-10-15 DIAGNOSIS — N1832 Chronic kidney disease, stage 3b: Secondary | ICD-10-CM | POA: Diagnosis not present

## 2022-10-15 DIAGNOSIS — R296 Repeated falls: Secondary | ICD-10-CM | POA: Diagnosis not present

## 2022-10-15 DIAGNOSIS — F319 Bipolar disorder, unspecified: Secondary | ICD-10-CM | POA: Diagnosis not present

## 2022-10-15 DIAGNOSIS — I10 Essential (primary) hypertension: Secondary | ICD-10-CM | POA: Diagnosis not present

## 2022-10-15 DIAGNOSIS — G2401 Drug induced subacute dyskinesia: Secondary | ICD-10-CM | POA: Diagnosis not present

## 2022-10-15 DIAGNOSIS — E785 Hyperlipidemia, unspecified: Secondary | ICD-10-CM | POA: Diagnosis not present

## 2022-10-15 DIAGNOSIS — E119 Type 2 diabetes mellitus without complications: Secondary | ICD-10-CM | POA: Diagnosis not present

## 2022-10-15 DIAGNOSIS — U071 COVID-19: Secondary | ICD-10-CM | POA: Diagnosis not present

## 2022-10-15 DIAGNOSIS — F411 Generalized anxiety disorder: Secondary | ICD-10-CM | POA: Diagnosis not present

## 2022-10-15 NOTE — Patient Outreach (Signed)
[place] Coordinator follow up. Mrs.  Lizer resides in [facility]. Screening for potential care coordination services as benefit of insurance plan and PCP.  Secure communication sent to [facility] social worker to request update on transition plans/date.   [facility] has Upstream care management.   Will continue to follow.   Marthenia Rolling, MSN, RN,BSN [place] Acute Care Coordinator [phone] (Direct dial)

## 2022-10-15 NOTE — Progress Notes (Deleted)
Cardiology Office Note Date:  10/15/2022  Patient ID:  [name] [name], Andreoni 05-26-1946, MRN [id number] PCP:  [name], MD  Electrophysiologist: Dr. Caryl Comes  ***refresh   Chief Complaint: *** post hospital  History of Present Illness: [name] [name] is a 76 y.o. female with history of HTN, HLD, DM, OSA w/CPAP, bipolar d/o, Tardive dyskinesia, Afib, conduction system disease (bifascicular block), falls > loop, CKD (IIIa)  She last saw Dr. Caryl Comes 02/26/21, for unexplained falls. Discussed SCAF, mention NSVT my prior monitoring though nothing to explain her falls Underwent loop implant.  Admitted 09/18/22, generalized weakness, falls, unclear specifics apparently with poor history, not clearly syncope (?) Felt could likely be secondary to her tardive dyskinesia, peripheral neuropathy secondary to medications. -Per Dr.Akula patient reported not sure whether she lost consciousness in the fall, history noted to be unreliable. -CT angio head, MRI negative for any acute stroke or LVO which would explain her recurrent falls. -Patient is status post loop recorder placement 02/2022. -EP was requested to interrogate the device, device interrogated and no significant arrhythmias or bradycardic events or pauses noted. -2D echo done with EF of 60 to 65%,NWMA, no significant valvular abnormalities. -TSH within normal limits, vitamin B12 and folate within normal limits. -Urine cultures with multiple species, patient asymptomatic. -Patient remained stable throughout the hospitalization, some improvement with weakness and no further recurrent falls noted. -Patient assessed by PT recommending SNF placement. -Patient will be discharged to SNF. Discharged 09/26/22  *** unclear why I felt she needed a visit *** brady on?? *** symptoms *** AF burden *** frequency of falls??? *** NSVT?   Device information MDT ILR (LINQ II) implanted 02/26/21   Past Medical History:  Diagnosis Date   Allergic  rhinitis    Bipolar disorder ([place])    Cholelithiasis    seen on CAT 01/04/09   Chronic renal disease, stage III ([place])    5/78   Complication of anesthesia    versed and fentanyl did not work for colonoscopy   Diabetic peripheral neuropathy ([place]) 07/18/2020   DM (diabetes mellitus) ([place])    Edema    Gait abnormality 08/26/2019   HLD (hyperlipidemia)    HTN (hypertension)    Iron deficiency anemia    3/12    Lymphedema    tarda   Neuropathy    Obstructive sleep apnea on CPAP    Osteoarthritis    Renal cyst    2.7 cm seen on CAT 01/04/09   Tardive dyskinesia     Past Surgical History:  Procedure Laterality Date   ABDOMINAL HYSTERECTOMY     BALLOON DILATION N/A 04/24/2020   Procedure: BALLOON DILATION;  Surgeon: Ronnette Juniper, MD;  [facility]: [facility];  Service: Gastroenterology;  Laterality: N/A;   BIOPSY  04/24/2020   Procedure: BIOPSY;  Surgeon: Ronnette Juniper, MD;  Location: [facility];  Service: Gastroenterology;;   BOTOX INJECTION N/A 04/24/2020   Procedure: POSSIBLE BOTOX INJECTION;  Surgeon: Ronnette Juniper, MD;  [facility];  Service: Gastroenterology;  Laterality: N/A;   COLONOSCOPY  2009; 07/09/11   2009:normal (polyp was not adenoma); 2012:    DILATION AND CURETTAGE OF UTERUS     ESOPHAGEAL MANOMETRY N/A 05/31/2020   Procedure: ESOPHAGEAL MANOMETRY (EM);  Surgeon: Ronnette Juniper, MD;  [facility];  Service: Gastroenterology;  Laterality: N/A;   ESOPHAGOGASTRODUODENOSCOPY  07/09/11   Erosive gastritis   ESOPHAGOGASTRODUODENOSCOPY (EGD) WITH PROPOFOL N/A 04/24/2020   Procedure: ESOPHAGOGASTRODUODENOSCOPY (EGD) WITH PROPOFOL;  Surgeon: Ronnette Juniper, MD;  [facility];  Service: Gastroenterology;  Laterality: N/A;   FRACTURE SURGERY     rt fx upper arm   GIVENS CAPSULE STUDY  07/25/2011   normal small bowel   REIMPLANT URETER IN BLADDER  1996   TRIGGER FINGER RELEASE Left 02/04/2013   Procedure: RELEASE TRIGGER FINGER/A-1 PULLEY LEFT RING FINGER;  Surgeon:  Tennis Must, MD;  Location: [facility];  Service: Orthopedics;  Laterality: Left;    Current Outpatient Medications  Medication Sig Dispense Refill   ALPRAZolam (XANAX) 1 MG tablet Take 1 tablet (1 mg total) by mouth 2 (two) times daily as needed for anxiety or sleep. 10 tablet 0   amLODipine (NORVASC) 10 MG tablet Take 1 tablet (10 mg total) by mouth at bedtime. 30 tablet 1   atorvastatin (LIPITOR) 20 MG tablet Take 1 tablet (20 mg total) by mouth at bedtime. 30 tablet 1   buPROPion (WELLBUTRIN XL) 150 MG 24 hr tablet Take one tablet in the morning and one tablet at lunch. (Patient taking differently: Take 150 mg by mouth daily.) 60 tablet 5   colestipol (COLESTID) 1 g tablet Take 2 g by mouth 2 (two) times daily.     lamoTRIgine (LAMICTAL) 200 MG tablet Take 1 tablet (200 mg total) by mouth 2 (two) times daily. 60 tablet 5   Multiple Vitamins-Minerals (MULTIVITAMIN WITH MINERALS) tablet Take 1 tablet by mouth daily.     senna-docusate (SENOKOT-S) 8.6-50 MG tablet Take 1 tablet by mouth at bedtime as needed for mild constipation.     sertraline (ZOLOFT) 100 MG tablet Take 1 tablet (100 mg total) by mouth 2 (two) times daily. 60 tablet 5   valbenazine (INGREZZA) 40 MG capsule Take 1 capsule (40 mg total) by mouth at bedtime. 30 capsule 5   No current facility-administered medications for this visit.    Allergies:   Latuda [lurasidone], Aripiprazole, Bacitracin-polymyxin b, Losartan potassium, Neo-bacit-poly-lidocaine, and Quetiapine   Social History:  The patient  reports that she quit smoking about 31 years ago. Her smoking use included cigarettes. She has never used smokeless tobacco. She reports that she does not currently use alcohol. She reports that she does not use drugs.   Family History:  The patient's family history includes Diabetes in her mother; Heart attack in her father; Heart disease in her father; Hyperlipidemia in her mother.  ROS:  Please see the history  of present illness.    All other systems are reviewed and otherwise negative.   PHYSICAL EXAM:  VS:  There were no vitals taken for this visit. BMI: There is no height or weight on file to calculate BMI. Well nourished, well developed, in no acute distress HEENT: normocephalic, atraumatic Neck: no JVD, carotid bruits or masses Cardiac:  *** RRR; no significant murmurs, no rubs, or gallops Lungs:  *** CTA b/l, no wheezing, rhonchi or rales Abd: soft, nontender MS: no deformity or *** atrophy Ext: *** no edema Skin: warm and dry, no rash Neuro:  No gross deficits appreciated Psych: euthymic mood, full affect  *** ILR site is stable, no tethering or discomfort   EKG:  not done today Hospital EKG is reviewed, SR 61bpm, RBBB, LAD  Device interrogation done today and reviewed by myself:  ***   09/24/22: TTE 1. Left ventricular ejection fraction, by estimation, is 60 to 65%. The  left ventricle has normal function. The left ventricle has no regional  wall motion abnormalities. Left ventricular diastolic parameters were  normal.  2. Right ventricular systolic function is normal. The right ventricular  size is normal. There is normal pulmonary artery systolic pressure.   3. The mitral valve is normal in structure. No evidence of mitral valve  regurgitation. No evidence of mitral stenosis.   4. The aortic valve was not well visualized. There is mild calcification  of the aortic valve. Aortic valve regurgitation is not visualized. Aortic  valve sclerosis/calcification is present, without any evidence of aortic  stenosis.   5. The inferior vena cava is normal in size with greater than 50%  respiratory variability, suggesting right atrial pressure of 3 mmHg.   Recent Labs: 09/18/2022: ALT 15; B Natriuretic Peptide 153.2; Magnesium 2.1 09/19/2022: TSH 2.148 09/21/2022: BUN 19; Creatinine, Ser 1.20; Hemoglobin 12.9; Platelets 156; Potassium 4.2; Sodium 138  No results found for requested  labs within last 365 days.   CrCl cannot be calculated (Patient's most recent lab result is older than the maximum 21 days allowed.).   Wt Readings from Last 3 Encounters:  09/24/22 205 lb 7.5 oz (93.2 kg)  02/26/21 167 lb (75.8 kg)  01/17/21 161 lb (73 kg)     Other studies reviewed: Additional studies/records reviewed today include: summarized above  ASSESSMENT AND PLAN:  Falls ***  SCAF CHA2DS2Vasc is 5, not placed on a/c *** %burden *** falls are concerning when considering a/c  NSVT ***    Disposition: F/u with ***  Current medicines are reviewed at length with the patient today.  The patient did not have any concerns regarding medicines.  Venetia Night, PA-C 10/15/2022 7:34 AM     [place]  [place] [phone] (office)  [phone] (fax)

## 2022-10-16 ENCOUNTER — Ambulatory Visit: Payer: Medicare Other | Admitting: Physician Assistant

## 2022-10-17 ENCOUNTER — Other Ambulatory Visit: Payer: Self-pay | Admitting: *Deleted

## 2022-10-17 NOTE — Patient Outreach (Signed)
[place] Coordinator follow up. [name] [name] resides in [facility].   Update received from SNF social worker indicating Mrs. Kasa is currently under COVID isolation. Plan remains to return home upon therapy completion.   [facility] has Upstream care management.   Will continue to follow.    Marthenia Rolling, MSN, RN,BSN [place] Acute Care Coordinator [phone] (Direct dial)

## 2022-10-18 DIAGNOSIS — N1832 Chronic kidney disease, stage 3b: Secondary | ICD-10-CM | POA: Diagnosis not present

## 2022-10-18 DIAGNOSIS — F411 Generalized anxiety disorder: Secondary | ICD-10-CM | POA: Diagnosis not present

## 2022-10-18 DIAGNOSIS — F319 Bipolar disorder, unspecified: Secondary | ICD-10-CM | POA: Diagnosis not present

## 2022-10-18 DIAGNOSIS — D509 Iron deficiency anemia, unspecified: Secondary | ICD-10-CM | POA: Diagnosis not present

## 2022-10-18 DIAGNOSIS — U071 COVID-19: Secondary | ICD-10-CM | POA: Diagnosis not present

## 2022-10-18 DIAGNOSIS — G2401 Drug induced subacute dyskinesia: Secondary | ICD-10-CM | POA: Diagnosis not present

## 2022-10-18 DIAGNOSIS — E119 Type 2 diabetes mellitus without complications: Secondary | ICD-10-CM | POA: Diagnosis not present

## 2022-10-18 DIAGNOSIS — E785 Hyperlipidemia, unspecified: Secondary | ICD-10-CM | POA: Diagnosis not present

## 2022-10-18 DIAGNOSIS — I10 Essential (primary) hypertension: Secondary | ICD-10-CM | POA: Diagnosis not present

## 2022-10-18 DIAGNOSIS — R296 Repeated falls: Secondary | ICD-10-CM | POA: Diagnosis not present

## 2022-10-22 ENCOUNTER — Telehealth: Payer: Self-pay

## 2022-10-22 ENCOUNTER — Telehealth: Payer: Self-pay | Admitting: Adult Health

## 2022-10-22 DIAGNOSIS — F411 Generalized anxiety disorder: Secondary | ICD-10-CM | POA: Diagnosis not present

## 2022-10-22 DIAGNOSIS — I10 Essential (primary) hypertension: Secondary | ICD-10-CM | POA: Diagnosis not present

## 2022-10-22 DIAGNOSIS — F319 Bipolar disorder, unspecified: Secondary | ICD-10-CM | POA: Diagnosis not present

## 2022-10-22 DIAGNOSIS — U071 COVID-19: Secondary | ICD-10-CM | POA: Diagnosis not present

## 2022-10-22 DIAGNOSIS — E785 Hyperlipidemia, unspecified: Secondary | ICD-10-CM | POA: Diagnosis not present

## 2022-10-22 DIAGNOSIS — D509 Iron deficiency anemia, unspecified: Secondary | ICD-10-CM | POA: Diagnosis not present

## 2022-10-22 DIAGNOSIS — R296 Repeated falls: Secondary | ICD-10-CM | POA: Diagnosis not present

## 2022-10-22 DIAGNOSIS — N1832 Chronic kidney disease, stage 3b: Secondary | ICD-10-CM | POA: Diagnosis not present

## 2022-10-22 DIAGNOSIS — G2401 Drug induced subacute dyskinesia: Secondary | ICD-10-CM | POA: Diagnosis not present

## 2022-10-22 DIAGNOSIS — E119 Type 2 diabetes mellitus without complications: Secondary | ICD-10-CM | POA: Diagnosis not present

## 2022-10-22 NOTE — Telephone Encounter (Signed)
Pt called today and said that she needs a refill on her ingrezza 40 mg. She said that she goes through [place] which is a like a pt assistance. She said that we call them at [phone]

## 2022-10-23 DIAGNOSIS — R600 Localized edema: Secondary | ICD-10-CM | POA: Diagnosis not present

## 2022-10-23 NOTE — Telephone Encounter (Signed)
Noted I am following up on this.

## 2022-10-24 DIAGNOSIS — I129 Hypertensive chronic kidney disease with stage 1 through stage 4 chronic kidney disease, or unspecified chronic kidney disease: Secondary | ICD-10-CM | POA: Diagnosis not present

## 2022-10-24 DIAGNOSIS — N1832 Chronic kidney disease, stage 3b: Secondary | ICD-10-CM | POA: Diagnosis not present

## 2022-10-24 DIAGNOSIS — G2401 Drug induced subacute dyskinesia: Secondary | ICD-10-CM | POA: Diagnosis not present

## 2022-10-24 DIAGNOSIS — E1121 Type 2 diabetes mellitus with diabetic nephropathy: Secondary | ICD-10-CM | POA: Diagnosis not present

## 2022-10-24 DIAGNOSIS — F319 Bipolar disorder, unspecified: Secondary | ICD-10-CM | POA: Diagnosis not present

## 2022-10-24 DIAGNOSIS — F411 Generalized anxiety disorder: Secondary | ICD-10-CM | POA: Diagnosis not present

## 2022-10-24 DIAGNOSIS — D508 Other iron deficiency anemias: Secondary | ICD-10-CM | POA: Diagnosis not present

## 2022-10-24 DIAGNOSIS — R296 Repeated falls: Secondary | ICD-10-CM | POA: Diagnosis not present

## 2022-10-24 DIAGNOSIS — M6281 Muscle weakness (generalized): Secondary | ICD-10-CM | POA: Diagnosis not present

## 2022-10-24 NOTE — Telephone Encounter (Signed)
Pt previously was a pt of Dr. Erling Cruz in [facility], their office completed her pt assistance in November 2022 and pt had refills for a year. Now it's time for a renewal. Will contact [facility] and see what information they need to proceed.  [phone]

## 2022-10-25 DIAGNOSIS — D509 Iron deficiency anemia, unspecified: Secondary | ICD-10-CM | POA: Diagnosis not present

## 2022-10-25 DIAGNOSIS — I10 Essential (primary) hypertension: Secondary | ICD-10-CM | POA: Diagnosis not present

## 2022-10-25 DIAGNOSIS — G2401 Drug induced subacute dyskinesia: Secondary | ICD-10-CM | POA: Diagnosis not present

## 2022-10-25 DIAGNOSIS — F411 Generalized anxiety disorder: Secondary | ICD-10-CM | POA: Diagnosis not present

## 2022-10-25 DIAGNOSIS — U071 COVID-19: Secondary | ICD-10-CM | POA: Diagnosis not present

## 2022-10-25 DIAGNOSIS — N1832 Chronic kidney disease, stage 3b: Secondary | ICD-10-CM | POA: Diagnosis not present

## 2022-10-25 DIAGNOSIS — F319 Bipolar disorder, unspecified: Secondary | ICD-10-CM | POA: Diagnosis not present

## 2022-10-25 DIAGNOSIS — E785 Hyperlipidemia, unspecified: Secondary | ICD-10-CM | POA: Diagnosis not present

## 2022-10-25 DIAGNOSIS — E119 Type 2 diabetes mellitus without complications: Secondary | ICD-10-CM | POA: Diagnosis not present

## 2022-10-25 DIAGNOSIS — R296 Repeated falls: Secondary | ICD-10-CM | POA: Diagnosis not present

## 2022-10-28 ENCOUNTER — Ambulatory Visit (INDEPENDENT_AMBULATORY_CARE_PROVIDER_SITE_OTHER): Payer: Medicare Other

## 2022-10-28 DIAGNOSIS — I4891 Unspecified atrial fibrillation: Secondary | ICD-10-CM | POA: Diagnosis not present

## 2022-10-28 NOTE — Telephone Encounter (Signed)
Pt  called checking the status of the ingrerzza

## 2022-10-29 LAB — CUP PACEART REMOTE DEVICE CHECK
Date Time Interrogation Session: 20231210231123
Implantable Pulse Generator Implant Date: 20220411

## 2022-10-29 NOTE — Progress Notes (Signed)
[facility] Summary Report / Loop Recorder 

## 2022-10-30 ENCOUNTER — Other Ambulatory Visit: Payer: Self-pay

## 2022-10-30 DIAGNOSIS — G2401 Drug induced subacute dyskinesia: Secondary | ICD-10-CM

## 2022-10-30 MED ORDER — VALBENAZINE TOSYLATE 40 MG PO CAPS: 40.0000 mg | ORAL_CAPSULE | Freq: Every day | ORAL | 11 refills | Status: DC

## 2022-10-30 MED ORDER — VALBENAZINE TOSYLATE 40 MG PO CAPS: 40.0000 mg | ORAL_CAPSULE | Freq: Every day | ORAL | 5 refills | Status: DC

## 2022-10-30 NOTE — Telephone Encounter (Signed)
Rx for Ingrezza 40 mg #30 with one year refills printed and faxed to [facility] [phone] This is a renewal and that is all they need.

## 2022-10-30 NOTE — Telephone Encounter (Signed)
Contacted [facility] again today. Waiting for an update.

## 2022-10-31 ENCOUNTER — Other Ambulatory Visit: Payer: Self-pay | Admitting: *Deleted

## 2022-10-31 NOTE — Patient Outreach (Signed)
Received notification of EMMI red flag on post SNF discharge telephone call. [name] [name] discharged from [facility] on 10/25/22.   Telephone call made to [name] [name] on [phone] (home). Message stating "call party temporarily unavailable." Telephone call made to [phone] (mobile). No answer. Unable to leave voicemail message.   Will follow up with SNF social worker regarding home health arrangements. Will send secure notification to Upstream care management as well. [facility] has upstream care management services.   Marthenia Rolling, MSN, RN,BSN [place] Acute Care Coordinator [phone] (Direct dial)

## 2022-11-04 NOTE — Telephone Encounter (Signed)
[name] [name] has the Rx so hopefully they will be shipping soon to her. She can get samples if needed.

## 2022-11-04 NOTE — Telephone Encounter (Signed)
Pt LVM asking status on Ingrezza. Contact # [phone]

## 2022-11-05 DIAGNOSIS — F3181 Bipolar II disorder: Secondary | ICD-10-CM | POA: Diagnosis not present

## 2022-11-05 NOTE — Telephone Encounter (Signed)
Pulled samples for Pt to pickup until mail order comes in.

## 2022-11-20 NOTE — Telephone Encounter (Signed)
Thank you :)

## 2022-11-20 NOTE — Telephone Encounter (Signed)
Can you make sure she has received her Ingrezza

## 2022-11-20 NOTE — Telephone Encounter (Signed)
She did.

## 2022-11-25 NOTE — Progress Notes (Unsigned)
Cardiology Office Note Date:  11/26/2022  Patient ID:  Halima, Fogal 03-30-1946, MRN [id number] PCP:  [name], MD  Cardiologist:  None Electrophysiologist: Virl Axe, MD   Chief Complaint: syncope, falls  History of Present Illness: CHINIQUA KILCREASE is a 77 y.o. female with PMH notable for parox afib, NSVT, syncope, falls; seen today for Virl Axe, MD for post hospital follow up.    Was recently admitted to [facility] 11/1-9/23 for syncope and falls. EP interrogated ILR at that time, found no significant arrhythmias, bradycardia, or pauses.  Since discharge from hospital the patient reports doing about the same.  She continues to have syncope. Was recently discharged from rehab facility.    she denies chest pain, palpitations, dyspnea, PND, orthopnea, nausea, vomiting, dizziness, edema, weight gain, or early satiety.     Device Information: MDT ILR, imp 2022; dx syncope, bradycardia  Past Medical History:  Diagnosis Date   Allergic rhinitis    Bipolar disorder ([place])    Cholelithiasis    seen on CAT 01/04/09   Chronic renal disease, stage III ([place])    5/02   Complication of anesthesia    versed and fentanyl did not work for colonoscopy   Diabetic peripheral neuropathy ([place]) 07/18/2020   DM (diabetes mellitus) ([place])    Edema    Gait abnormality 08/26/2019   HLD (hyperlipidemia)    HTN (hypertension)    Iron deficiency anemia    3/12    Lymphedema    tarda   Neuropathy    Obstructive sleep apnea on CPAP    Osteoarthritis    Renal cyst    2.7 cm seen on CAT 01/04/09   Tardive dyskinesia     Past Surgical History:  Procedure Laterality Date   ABDOMINAL HYSTERECTOMY     BALLOON DILATION N/A 04/24/2020   Procedure: BALLOON DILATION;  Surgeon: Ronnette Juniper, MD;  [facility];  Service: Gastroenterology;  Laterality: N/A;   BIOPSY  04/24/2020   Procedure: BIOPSY;  Surgeon: Ronnette Juniper, MD;  Location: [facility];  Service: Gastroenterology;;   BOTOX  INJECTION N/A 04/24/2020   Procedure: POSSIBLE BOTOX INJECTION;  Surgeon: Ronnette Juniper, MD;  [facility];  Service: Gastroenterology;  Laterality: N/A;   COLONOSCOPY  2009; 07/09/11   2009:normal (polyp was not adenoma); 2012:    DILATION AND CURETTAGE OF UTERUS     ESOPHAGEAL MANOMETRY N/A 05/31/2020   Procedure: ESOPHAGEAL MANOMETRY (EM);  Surgeon: Ronnette Juniper, MD;  [facility];  Service: Gastroenterology;  Laterality: N/A;   ESOPHAGOGASTRODUODENOSCOPY  07/09/11   Erosive gastritis   ESOPHAGOGASTRODUODENOSCOPY (EGD) WITH PROPOFOL N/A 04/24/2020   Procedure: ESOPHAGOGASTRODUODENOSCOPY (EGD) WITH PROPOFOL;  Surgeon: Ronnette Juniper, MD;  [facility];  Service: Gastroenterology;  Laterality: N/A;   FRACTURE SURGERY     rt fx upper arm   GIVENS CAPSULE STUDY  07/25/2011   normal small bowel   REIMPLANT URETER IN BLADDER  1996   TRIGGER FINGER RELEASE Left 02/04/2013   Procedure: RELEASE TRIGGER FINGER/A-1 PULLEY LEFT RING FINGER;  Surgeon: Tennis Must, MD;  Location: [facility];  Service: Orthopedics;  Laterality: Left;    Current Outpatient Medications  Medication Instructions   ALPRAZolam (XANAX) 1 mg, Oral, 2 times daily PRN   amLODipine (NORVASC) 10 mg, Oral, Daily at bedtime   atorvastatin (LIPITOR) 20 mg, Oral, Daily at bedtime   buPROPion (WELLBUTRIN XL) 150 MG 24 hr tablet Take one tablet in the morning and one tablet  at lunch.   colestipol (COLESTID) 2 g, Oral, 2 times daily   lamoTRIgine (LAMICTAL) 200 mg, Oral, 2 times daily   Multiple Vitamins-Minerals (MULTIVITAMIN WITH MINERALS) tablet 1 tablet, Oral, Daily   senna-docusate (SENOKOT-S) 8.6-50 MG tablet 1 tablet, Oral, At bedtime PRN   sertraline (ZOLOFT) 100 mg, Oral, 2 times daily   valbenazine (INGREZZA) 40 mg, Oral, Daily at bedtime      Social History:  The patient  reports that she quit smoking about 31 years ago. Her smoking use included cigarettes. She has never used smokeless  tobacco. She reports that she does not currently use alcohol. She reports that she does not use drugs.   Family History:   include only if pertinent The patient's family history includes Diabetes in her mother; Heart attack in her father; Heart disease in her father; Hyperlipidemia in her mother.  ROS:  Please see the history of present illness. All other systems are reviewed and otherwise negative.   PHYSICAL EXAM:  VS:  BP 126/78   Pulse 77   Ht '5\' 3"'$  (1.6 m)   Wt 197 lb 12.8 oz (89.7 kg)   SpO2 96%   BMI 35.04 kg/m  BMI: Body mass index is 35.04 kg/m.  GEN- The patient is well appearing, alert and oriented x 3 today.   HEENT: normocephalic, atraumatic; sclera clear, conjunctiva pink; hearing intact; oropharynx clear; neck supple, no JVP Lungs- Clear to ausculation bilaterally, normal work of breathing.  No wheezes, rales, rhonchi Heart- Regular rate and rhythm, no murmurs, rubs or gallops, PMI not laterally displaced GI- soft, non-tender, non-distended, bowel sounds present, no hepatosplenomegaly Extremities- 1-2+ peripheral edema. no clubbing or cyanosis; DP/PT/radial pulses 2+ bilaterally MS- no significant deformity or atrophy Skin- warm and dry, no rash or lesion, unable to palpate ILR device  Psych- euthymic mood, full affect Neuro- strength and sensation are intact   ILR Device interrogation done today and reviewed by myself:  Battery good Lead sensing stable  Frequent PACs No AF   EKG is not ordered.   Recent Labs: 09/18/2022: ALT 15; B Natriuretic Peptide 153.2; Magnesium 2.1 09/19/2022: TSH 2.148 09/21/2022: BUN 19; Creatinine, Ser 1.20; Hemoglobin 12.9; Platelets 156; Potassium 4.2; Sodium 138  No results found for requested labs within last 365 days.   CrCl cannot be calculated (Patient's most recent lab result is older than the maximum 21 days allowed.).   Wt Readings from Last 3 Encounters:  11/26/22 197 lb 12.8 oz (89.7 kg)  09/24/22 205 lb 7.5 oz (93.2  kg)  02/26/21 167 lb (75.8 kg)     Additional studies reviewed include: Previous EP, cardiology notes.     ASSESSMENT AND PLAN:  #) Falls, Syncope s/p ILR implant No clear cardiac etiology for syncope I've recommended she continue to follow-up with PCP and neurology for further workup Continue monthly remotes  #) HTN At goal today.  Recommend checking blood pressures 1-2 times per week at home and recording the values.  Recommend bringing these recordings to the primary care physician.  #) parox afib No AF on ILR No indication for [place] at this time.     Signed,  Mamie Levers, NP    11/26/2022 10:06 AM     Current medicines are reviewed at length with the patient today.   The patient does not have concerns regarding her medicines.  The following changes were made today:  none  Labs/ tests ordered today include: none No orders of the defined types were placed  in this encounter.    Disposition: Follow up with Dr. Caryl Comes in  PRN    Signed, Mamie Levers, NP  11/26/22 10:06 AM   [address] [place] [id number] [phone] (office)  [phone] (fax)

## 2022-11-26 ENCOUNTER — Encounter: Payer: Self-pay | Admitting: Student

## 2022-11-26 ENCOUNTER — Ambulatory Visit: Payer: Medicare Other | Attending: Physician Assistant | Admitting: Cardiology

## 2022-11-26 VITALS — BP 126/78 | HR 77 | Ht 63.0 in | Wt 197.8 lb

## 2022-11-26 DIAGNOSIS — R296 Repeated falls: Secondary | ICD-10-CM | POA: Diagnosis not present

## 2022-11-26 DIAGNOSIS — R55 Syncope and collapse: Secondary | ICD-10-CM | POA: Diagnosis not present

## 2022-11-26 DIAGNOSIS — I10 Essential (primary) hypertension: Secondary | ICD-10-CM | POA: Insufficient documentation

## 2022-11-26 DIAGNOSIS — Z95818 Presence of other cardiac implants and grafts: Secondary | ICD-10-CM | POA: Insufficient documentation

## 2022-11-26 DIAGNOSIS — I4729 Other ventricular tachycardia: Secondary | ICD-10-CM | POA: Diagnosis not present

## 2022-11-29 ENCOUNTER — Telehealth: Payer: Self-pay | Admitting: Adult Health

## 2022-11-29 ENCOUNTER — Other Ambulatory Visit: Payer: Self-pay

## 2022-11-29 ENCOUNTER — Other Ambulatory Visit: Payer: Self-pay | Admitting: Adult Health

## 2022-11-29 DIAGNOSIS — F411 Generalized anxiety disorder: Secondary | ICD-10-CM

## 2022-11-29 MED ORDER — ALPRAZOLAM 1 MG PO TABS: 1.0000 mg | ORAL_TABLET | Freq: Two times a day (BID) | ORAL | 0 refills | Status: DC | PRN

## 2022-11-29 NOTE — Telephone Encounter (Signed)
Script sent for 5 - '1mg'$  tablets.

## 2022-11-29 NOTE — Telephone Encounter (Signed)
Please call to schedule an appt. Last seen in September with RTC in 4 weeks.

## 2022-11-29 NOTE — Telephone Encounter (Signed)
Last filled 12/26

## 2022-11-29 NOTE — Telephone Encounter (Signed)
Pt needs a refill on her xanax. She said her husband accidentally threw it away. Also she had an attempted suicide. She took lots of pills and woke up early and threw them up. She felt woozy all day. She has not been seen since September. Pharmacy is [facility]  on [address]

## 2022-11-29 NOTE — Telephone Encounter (Signed)
Patient said her husband threw out her alprazolam and she is asking for another refill. She last filled on 12/26 and has not been seen since September. She scheduled today for 1/30. She said she had a suicide attempt "about a month ago." It was not on alprazolam, but hydrocodone. She had recently been in rehab at a SNF. She denies seeking treatment after SI. I have not pended a RF, awaiting your response.

## 2022-11-29 NOTE — Telephone Encounter (Signed)
Pt is scheduled 12/17/22

## 2022-12-02 ENCOUNTER — Ambulatory Visit (INDEPENDENT_AMBULATORY_CARE_PROVIDER_SITE_OTHER): Payer: Medicare Other

## 2022-12-02 DIAGNOSIS — I4729 Other ventricular tachycardia: Secondary | ICD-10-CM

## 2022-12-04 LAB — CUP PACEART REMOTE DEVICE CHECK
Date Time Interrogation Session: 20240112230510
Implantable Pulse Generator Implant Date: 20220411

## 2022-12-05 NOTE — Progress Notes (Signed)
Carelink Summary Report / Loop Recorder

## 2022-12-06 DIAGNOSIS — F3181 Bipolar II disorder: Secondary | ICD-10-CM | POA: Diagnosis not present

## 2022-12-09 ENCOUNTER — Telehealth: Payer: Self-pay | Admitting: Adult Health

## 2022-12-09 ENCOUNTER — Other Ambulatory Visit: Payer: Self-pay

## 2022-12-09 DIAGNOSIS — F411 Generalized anxiety disorder: Secondary | ICD-10-CM

## 2022-12-09 MED ORDER — ALPRAZOLAM 1 MG PO TABS: 1.0000 mg | ORAL_TABLET | Freq: Two times a day (BID) | ORAL | 0 refills | Status: DC | PRN

## 2022-12-09 NOTE — Telephone Encounter (Signed)
Yes

## 2022-12-09 NOTE — Telephone Encounter (Signed)
You sent rx on 1/12 for 5 tabs but it was printed and never went to pharmacy so she did not get it.It was to replace rx filled on 12/23 that her husband threw out.According to the 4 week date this rx is due tomorrow.Is it ok to go ahead and pend a full dose?

## 2022-12-09 NOTE — Telephone Encounter (Signed)
Pt called again about Alprazolam. Pt was advised the Rx sent on 1/12 to pharmacy. She was confused. RTC  [phone]. Has not taken any yesterday or today.

## 2022-12-09 NOTE — Telephone Encounter (Signed)
Pended.

## 2022-12-11 DIAGNOSIS — I10 Essential (primary) hypertension: Secondary | ICD-10-CM | POA: Diagnosis not present

## 2022-12-11 DIAGNOSIS — E119 Type 2 diabetes mellitus without complications: Secondary | ICD-10-CM | POA: Diagnosis not present

## 2022-12-12 DIAGNOSIS — K219 Gastro-esophageal reflux disease without esophagitis: Secondary | ICD-10-CM | POA: Diagnosis not present

## 2022-12-12 DIAGNOSIS — N1831 Chronic kidney disease, stage 3a: Secondary | ICD-10-CM | POA: Diagnosis not present

## 2022-12-12 DIAGNOSIS — E785 Hyperlipidemia, unspecified: Secondary | ICD-10-CM | POA: Diagnosis not present

## 2022-12-12 DIAGNOSIS — F319 Bipolar disorder, unspecified: Secondary | ICD-10-CM | POA: Diagnosis not present

## 2022-12-12 DIAGNOSIS — E1142 Type 2 diabetes mellitus with diabetic polyneuropathy: Secondary | ICD-10-CM | POA: Diagnosis not present

## 2022-12-12 DIAGNOSIS — I129 Hypertensive chronic kidney disease with stage 1 through stage 4 chronic kidney disease, or unspecified chronic kidney disease: Secondary | ICD-10-CM | POA: Diagnosis not present

## 2022-12-17 ENCOUNTER — Encounter: Payer: Self-pay | Admitting: Adult Health

## 2022-12-17 ENCOUNTER — Ambulatory Visit (INDEPENDENT_AMBULATORY_CARE_PROVIDER_SITE_OTHER): Payer: Medicare Other | Admitting: Adult Health

## 2022-12-17 DIAGNOSIS — G2401 Drug induced subacute dyskinesia: Secondary | ICD-10-CM

## 2022-12-17 DIAGNOSIS — G4700 Insomnia, unspecified: Secondary | ICD-10-CM

## 2022-12-17 DIAGNOSIS — F411 Generalized anxiety disorder: Secondary | ICD-10-CM

## 2022-12-17 DIAGNOSIS — F3181 Bipolar II disorder: Secondary | ICD-10-CM

## 2022-12-17 MED ORDER — SERTRALINE HCL 100 MG PO TABS: 100.0000 mg | ORAL_TABLET | Freq: Two times a day (BID) | ORAL | 5 refills | Status: DC

## 2022-12-17 MED ORDER — BUPROPION HCL ER (XL) 150 MG PO TB24: ORAL_TABLET | ORAL | 5 refills | Status: DC

## 2022-12-17 MED ORDER — LAMOTRIGINE 200 MG PO TABS: 200.0000 mg | ORAL_TABLET | Freq: Two times a day (BID) | ORAL | 5 refills | Status: DC

## 2022-12-17 NOTE — Progress Notes (Signed)
CONTRINA [name] [id number] [birth year] 77 y.o.  Subjective:   Patient ID:  [name] [name] is a 77 y.o. (DOB October 06, 1946) female.  Chief Complaint: No chief complaint on file.   HPI [name] [name] presents to the office today for follow-up of BPD-2, GAD, insomnia, and TD.  Referred by [employer] for medication evaluation.  Describes mood today as "ok". Pleasant. Denies tearfulness. Mood symptoms - reports decreased depression - "I feel sad". Denies irritability and anxiety. Denies worry and rumination. Denies over thinking. Mood is stable. Stating "I'm doing alright". Reports recovering from Covid - went into a nursing facility to work with PT and then got Covid. Feels like current medications are helpful and does not wish to make any changes. Varying interest and motivation. Taking medications as prescribed.  Energy levels lower. Active, does not have a regular exercise routine.  Enjoys some usual interests and activities. Married. Lives with husband of 95 years. Has 2 grown children. Spending time with family and friends. Appetite adequate. Weight stable. Sleep varies day to day - improved lately.  Focus and concentration stable. Completing tasks. Managing aspects of household. Retired - lower Education officer, museum.  Denies SI or HI.  Denies AH or VH. Denies self harm. Denies substance use.  Previous medication trials: Abilify, Seroquel, Latuda, [facility] to Hosp-Admission (Discharged) from 09/18/2022 in [facility] from 06/22/2022 in [facility] at [place] No Risk No Risk        Review of Systems:  Review of Systems  Musculoskeletal:  Negative for gait problem.  Neurological:  Negative for tremors.  Psychiatric/Behavioral:         Please refer to HPI    Medications: I have reviewed the patient's current medications.  Current Outpatient Medications  Medication Sig Dispense Refill    ALPRAZolam (XANAX) 1 MG tablet Take 1 tablet (1 mg total) by mouth 2 (two) times daily as needed for anxiety or sleep. 60 tablet 0   amLODipine (NORVASC) 10 MG tablet Take 1 tablet (10 mg total) by mouth at bedtime. 30 tablet 1   atorvastatin (LIPITOR) 20 MG tablet Take 1 tablet (20 mg total) by mouth at bedtime. 30 tablet 1   buPROPion (WELLBUTRIN XL) 150 MG 24 hr tablet Take one tablet in the morning and one tablet at lunch. 60 tablet 5   colestipol (COLESTID) 1 g tablet Take 2 g by mouth 2 (two) times daily.     lamoTRIgine (LAMICTAL) 200 MG tablet Take 1 tablet (200 mg total) by mouth 2 (two) times daily. 60 tablet 5   Multiple Vitamins-Minerals (MULTIVITAMIN WITH MINERALS) tablet Take 1 tablet by mouth daily.     senna-docusate (SENOKOT-S) 8.6-50 MG tablet Take 1 tablet by mouth at bedtime as needed for mild constipation.     sertraline (ZOLOFT) 100 MG tablet Take 1 tablet (100 mg total) by mouth 2 (two) times daily. 60 tablet 5   valbenazine (INGREZZA) 40 MG capsule Take 1 capsule (40 mg total) by mouth at bedtime. 30 capsule 11   No current facility-administered medications for this visit.    Medication Side Effects: None  Allergies:  Allergies  Allergen Reactions   Latuda [Lurasidone] Other (See Comments)    Increased suicidal thoughts    Aripiprazole Other (See Comments)    Tardive Dyskinesia Other reaction(s): tardive dyskinesia   Bacitracin-Polymyxin B     Other reaction(s): rash   Losartan Potassium  Other reaction(s): decline gfr   Neo-Bacit-Poly-Lidocaine    Quetiapine Other (See Comments)    Tardive Dyskinesia Other reaction(s): tardive dyskinesia    Past Medical History:  Diagnosis Date   Allergic rhinitis    Bipolar disorder ([place])    Cholelithiasis    seen on CAT 01/04/09   Chronic renal disease, stage III ([place])    1/69   Complication of anesthesia    versed and fentanyl did not work for colonoscopy   Diabetic peripheral neuropathy ([place]) 07/18/2020   DM  (diabetes mellitus) ([place])    Edema    Gait abnormality 08/26/2019   HLD (hyperlipidemia)    HTN (hypertension)    Iron deficiency anemia    3/12    Lymphedema    tarda   Neuropathy    Obstructive sleep apnea on CPAP    Osteoarthritis    Renal cyst    2.7 cm seen on CAT 01/04/09   Tardive dyskinesia     Past Medical History, Surgical history, Social history, and Family history were reviewed and updated as appropriate.   Please see review of systems for further details on the patient's review from today.   Objective:   Physical Exam:  There were no vitals taken for this visit.  Physical Exam Constitutional:      General: She is not in acute distress. Musculoskeletal:        General: No deformity.  Neurological:     Mental Status: She is alert and oriented to person, place, and time.     Coordination: Coordination normal.  Psychiatric:        Attention and Perception: Attention and perception normal. She does not perceive auditory or visual hallucinations.        Mood and Affect: Mood normal. Mood is not anxious or depressed. Affect is not labile, blunt, angry or inappropriate.        Speech: Speech normal.        Behavior: Behavior normal.        Thought Content: Thought content normal. Thought content is not paranoid or delusional. Thought content does not include homicidal or suicidal ideation. Thought content does not include homicidal or suicidal plan.        Cognition and Memory: Cognition and memory normal.        Judgment: Judgment normal.     Comments: Insight intact     Lab Review:     Component Value Date/Time   NA 138 09/21/2022 0349   K 4.2 09/21/2022 0349   CL 106 09/21/2022 0349   CO2 22 09/21/2022 0349   GLUCOSE 99 09/21/2022 0349   BUN 19 09/21/2022 0349   CREATININE 1.20 (H) 09/21/2022 0349   CALCIUM 9.5 09/21/2022 0349   PROT 7.1 09/18/2022 1309   PROT 7.0 01/05/2020 1035   ALBUMIN 3.9 09/18/2022 1309   AST 21 09/18/2022 1309   ALT 15  09/18/2022 1309   ALKPHOS 53 09/18/2022 1309   BILITOT 0.6 09/18/2022 1309   GFRNONAA 47 (L) 09/21/2022 0349   GFRAA 47 (L) 12/06/2019 2242       Component Value Date/Time   WBC 7.3 09/21/2022 0349   RBC 3.76 (L) 09/21/2022 0349   HGB 12.9 09/21/2022 0349   HCT 37.8 09/21/2022 0349   PLT 156 09/21/2022 0349   MCV 100.5 (H) 09/21/2022 0349   MCH 34.3 (H) 09/21/2022 0349   MCHC 34.1 09/21/2022 0349   RDW 12.5 09/21/2022 0349   LYMPHSABS 0.7 10/31/2020 1107  MONOABS 0.5 10/31/2020 1107   EOSABS 0.2 10/31/2020 1107   BASOSABS 0.0 10/31/2020 1107    No results found for: "POCLITH", "LITHIUM"   No results found for: "PHENYTOIN", "PHENOBARB", "VALPROATE", "CBMZ"   .res Assessment: Plan:    Plan:  PDMP reviewed  Ingrezza '40mg'$  daily  Zoloft '100mg'$  - 2 daily Lamictal '200mg'$  BID Wellbutrin XL '300mg'$  - taking '150mg'$  in the morning and at lunch Xanax '1mg'$  BID for anxiety  Patient seen for 25 minutes and time spent discussing treatment options.  RTC 4 weeks  Therapist - Bambi Cottle  Patient advised to contact office with any questions, adverse effects, or acute worsening in signs and symptoms.  Discussed potential benefits, risk, and side effects of benzodiazepines to include potential risk of tolerance and dependence, as well as possible drowsiness.  Advised patient not to drive if experiencing drowsiness and to take lowest possible effective dose to minimize risk of dependence and tolerance.  Diagnoses and all orders for this visit:  Bipolar II disorder ([place]) -     sertraline (ZOLOFT) 100 MG tablet; Take 1 tablet (100 mg total) by mouth 2 (two) times daily. -     lamoTRIgine (LAMICTAL) 200 MG tablet; Take 1 tablet (200 mg total) by mouth 2 (two) times daily. -     buPROPion (WELLBUTRIN XL) 150 MG 24 hr tablet; Take one tablet in the morning and one tablet at lunch.  Generalized anxiety disorder -     sertraline (ZOLOFT) 100 MG tablet; Take 1 tablet (100 mg total) by mouth  2 (two) times daily.  Tardive dyskinesia  Insomnia, unspecified type     Please see After Visit Summary for patient specific instructions.  Future Appointments  Date Time Provider [place]  01/06/2023  7:00 AM [facility] DEVICE REMOTES [facility]  02/10/2023  7:00 AM [facility] DEVICE REMOTES [facility]  03/17/2023  7:00 AM [facility] DEVICE REMOTES [facility]    No orders of the defined types were placed in this encounter.   -------------------------------

## 2022-12-18 DIAGNOSIS — K5289 Other specified noninfective gastroenteritis and colitis: Secondary | ICD-10-CM | POA: Diagnosis not present

## 2022-12-18 DIAGNOSIS — R110 Nausea: Secondary | ICD-10-CM | POA: Diagnosis not present

## 2022-12-18 DIAGNOSIS — K219 Gastro-esophageal reflux disease without esophagitis: Secondary | ICD-10-CM | POA: Diagnosis not present

## 2022-12-24 DIAGNOSIS — F3181 Bipolar II disorder: Secondary | ICD-10-CM | POA: Diagnosis not present

## 2023-01-05 LAB — CUP PACEART REMOTE DEVICE CHECK
Date Time Interrogation Session: 20240214230640
Implantable Pulse Generator Implant Date: 20220411

## 2023-01-06 ENCOUNTER — Ambulatory Visit (INDEPENDENT_AMBULATORY_CARE_PROVIDER_SITE_OTHER): Payer: Medicare Other

## 2023-01-06 DIAGNOSIS — I4729 Other ventricular tachycardia: Secondary | ICD-10-CM | POA: Diagnosis not present

## 2023-01-09 DIAGNOSIS — F3181 Bipolar II disorder: Secondary | ICD-10-CM | POA: Diagnosis not present

## 2023-01-13 DIAGNOSIS — I10 Essential (primary) hypertension: Secondary | ICD-10-CM | POA: Diagnosis not present

## 2023-01-13 DIAGNOSIS — E119 Type 2 diabetes mellitus without complications: Secondary | ICD-10-CM | POA: Diagnosis not present

## 2023-01-20 NOTE — Progress Notes (Signed)
[facility] Summary Report / Loop Recorder 

## 2023-01-21 DIAGNOSIS — F3181 Bipolar II disorder: Secondary | ICD-10-CM | POA: Diagnosis not present

## 2023-01-27 DIAGNOSIS — K219 Gastro-esophageal reflux disease without esophagitis: Secondary | ICD-10-CM | POA: Diagnosis not present

## 2023-01-27 DIAGNOSIS — K2270 Barrett's esophagus without dysplasia: Secondary | ICD-10-CM | POA: Diagnosis not present

## 2023-01-27 DIAGNOSIS — K5289 Other specified noninfective gastroenteritis and colitis: Secondary | ICD-10-CM | POA: Diagnosis not present

## 2023-01-30 DIAGNOSIS — I129 Hypertensive chronic kidney disease with stage 1 through stage 4 chronic kidney disease, or unspecified chronic kidney disease: Secondary | ICD-10-CM | POA: Diagnosis not present

## 2023-01-30 DIAGNOSIS — E785 Hyperlipidemia, unspecified: Secondary | ICD-10-CM | POA: Diagnosis not present

## 2023-01-30 DIAGNOSIS — F319 Bipolar disorder, unspecified: Secondary | ICD-10-CM | POA: Diagnosis not present

## 2023-01-30 DIAGNOSIS — K219 Gastro-esophageal reflux disease without esophagitis: Secondary | ICD-10-CM | POA: Diagnosis not present

## 2023-01-31 DIAGNOSIS — R300 Dysuria: Secondary | ICD-10-CM | POA: Diagnosis not present

## 2023-01-31 DIAGNOSIS — K2270 Barrett's esophagus without dysplasia: Secondary | ICD-10-CM | POA: Diagnosis not present

## 2023-01-31 DIAGNOSIS — I129 Hypertensive chronic kidney disease with stage 1 through stage 4 chronic kidney disease, or unspecified chronic kidney disease: Secondary | ICD-10-CM | POA: Diagnosis not present

## 2023-01-31 DIAGNOSIS — D7589 Other specified diseases of blood and blood-forming organs: Secondary | ICD-10-CM | POA: Diagnosis not present

## 2023-01-31 DIAGNOSIS — N1832 Chronic kidney disease, stage 3b: Secondary | ICD-10-CM | POA: Diagnosis not present

## 2023-02-03 DIAGNOSIS — E119 Type 2 diabetes mellitus without complications: Secondary | ICD-10-CM | POA: Diagnosis not present

## 2023-02-03 DIAGNOSIS — I10 Essential (primary) hypertension: Secondary | ICD-10-CM | POA: Diagnosis not present

## 2023-02-10 ENCOUNTER — Ambulatory Visit (INDEPENDENT_AMBULATORY_CARE_PROVIDER_SITE_OTHER): Payer: Medicare Other

## 2023-02-10 DIAGNOSIS — I4729 Other ventricular tachycardia: Secondary | ICD-10-CM

## 2023-02-10 LAB — CUP PACEART REMOTE DEVICE CHECK
Date Time Interrogation Session: 20240324230738
Implantable Pulse Generator Implant Date: 20220411

## 2023-02-11 DIAGNOSIS — I890 Lymphedema, not elsewhere classified: Secondary | ICD-10-CM | POA: Diagnosis not present

## 2023-02-19 NOTE — Progress Notes (Signed)
[facility] Summary Report / Loop Recorder 

## 2023-02-21 ENCOUNTER — Other Ambulatory Visit: Payer: Self-pay | Admitting: Adult Health

## 2023-02-21 DIAGNOSIS — F411 Generalized anxiety disorder: Secondary | ICD-10-CM

## 2023-02-26 ENCOUNTER — Ambulatory Visit (INDEPENDENT_AMBULATORY_CARE_PROVIDER_SITE_OTHER): Payer: Medicare Other | Admitting: Psychology

## 2023-02-26 DIAGNOSIS — F3181 Bipolar II disorder: Secondary | ICD-10-CM | POA: Diagnosis not present

## 2023-02-26 DIAGNOSIS — F4310 Post-traumatic stress disorder, unspecified: Secondary | ICD-10-CM

## 2023-02-26 NOTE — Progress Notes (Signed)
[facility] Counselor Initial Adult Exam  Name: [name] [name] Date: 02/26/2023 MRN: [id number] DOB: 1946/10/11 PCP: Joya Martyr, MD (Inactive)  Time spent: 60 minutes  Guardian/Payee:  self   Paperwork requested: No   Reason for Visit /Presenting Problem: History of developmental trauma.  The patient attended the initial diagnostic evaluation in the office today.    Mental Status Exam: Appearance:   Casual     Behavior:  Appropriate  Motor:  Normal  Speech/Language:   Clear and Coherent  Affect:  Appropriate  Mood:  normal  Thought process:  normal  Thought content:    WNL  Sensory/Perceptual disturbances:    WNL  Orientation:  oriented to person, place, time/date, and situation  Attention:  Good  Concentration:  Good  Memory:  WNL  Fund of knowledge:   Good  Insight:    Good  Judgment:   Good  Impulse Control:  Good    Reported Symptoms:  patient is often reactive and gets into arguments with her husband out of previous trauma responses.  Risk Assessment: Danger to Self:  No Self-injurious Behavior: No Danger to Others: No Duty to Warn:no Yes  Access to Firearms a concern: No  Gang Involvement:No  Patient / guardian was educated about steps to take if suicide or homicide risk level increases between visits: no While future psychiatric events cannot be accurately predicted, the patient does not currently require acute [facility].  Substance Abuse History:  Patient psychiatric care and does not currently meet [facility] involuntary commitment criterion. Current substance abuse: No     Past Psychiatric History:   Previous psychological history is significant for depression Outpatient Providers: Patient has a therapist, but is not seeing her while we do the EMDR History of Psych Hospitalization: No  Psychological Testing: n/a  Abuse History:  Victim of: Yes.  , emotional   Report needed: No. Victim of Neglect:No. Perpetrator of  no Witness / Exposure to Domestic Violence: No   Protective Services Involvement: No  Witness to [employer] Violence:  No   Family History:  Family History  Problem Relation Age of Onset   Diabetes Mother    Hyperlipidemia Mother    Heart disease Father    Heart attack Father    Colon cancer Neg Hx     Living situation: the patient lives with their spouse  Sexual Orientation: Straight  Relationship Status: married  Name of spouse / other:[name] [name] If a parent, number of children / ages:Two grown children and two grandchildren  Support Systems: spouse  Surveyor, quantity Stress:  No   Income/Employment/Disability: Neurosurgeon: No   Educational History: Education: Risk manager: Protestant  Any cultural differences that may affect / interfere with treatment:  not applicable   Recreation/Hobbies: collecting glass  Stressors: Health problems   Marital or family conflict   Traumatic event    Strengths: Supportive Relationships  Barriers:  patient's health  Legal History: Pending legal issue / charges: The patient has no significant history of legal issues. History of legal issue / charges: n/a  Medical History/Surgical History: reviewed Past Medical History:  Diagnosis Date   Allergic rhinitis    Bipolar disorder ([facility])    Cholelithiasis    seen on CAT 01/04/09   Chronic renal disease, stage III ([facility])    3/12   Complication of anesthesia    versed and fentanyl did not work for colonoscopy   Diabetic peripheral neuropathy ([facility]) 07/18/2020  DM (diabetes mellitus) ([facility])    Edema    Gait abnormality 08/26/2019   HLD (hyperlipidemia)    HTN (hypertension)    Iron deficiency anemia    3/12    Lymphedema    tarda   Neuropathy    Obstructive sleep apnea on CPAP    Osteoarthritis    Renal cyst    2.7 cm seen on CAT 01/04/09   Tardive dyskinesia     Past Surgical History:  Procedure Laterality Date    ABDOMINAL HYSTERECTOMY     BALLOON DILATION N/A 04/24/2020   Procedure: BALLOON DILATION;  Surgeon: Kerin SalenKarki, Arya, MD;  Location: [facility];  Service: Gastroenterology;  Laterality: N/A;   BIOPSY  04/24/2020   Procedure: BIOPSY;  Surgeon: Kerin SalenKarki, Arya, MD;  [facility];  Service: Gastroenterology;;   BOTOX INJECTION N/A 04/24/2020   Procedure: POSSIBLE BOTOX INJECTION;  Surgeon: Kerin SalenKarki, Arya, MD;  Location: [facility];  Service: Gastroenterology;  Laterality: N/A;   COLONOSCOPY  2009; 07/09/11   2009:normal (polyp was not adenoma); 2012:    DILATION AND CURETTAGE OF UTERUS     ESOPHAGEAL MANOMETRY N/A 05/31/2020   Procedure: ESOPHAGEAL MANOMETRY (EM);  Surgeon: Kerin SalenKarki, Arya, MD;  Location: [facility];  Service: Gastroenterology;  Laterality: N/A;   ESOPHAGOGASTRODUODENOSCOPY  07/09/11   Erosive gastritis   ESOPHAGOGASTRODUODENOSCOPY (EGD) WITH PROPOFOL N/A 04/24/2020   Procedure: ESOPHAGOGASTRODUODENOSCOPY (EGD) WITH PROPOFOL;  Surgeon: Kerin SalenKarki, Arya, MD;  Location: [facility];  Service: Gastroenterology;  Laterality: N/A;   FRACTURE SURGERY     rt fx upper arm   GIVENS CAPSULE STUDY  07/25/2011   normal small bowel   REIMPLANT URETER IN BLADDER  1996   TRIGGER FINGER RELEASE Left 02/04/2013   Procedure: RELEASE TRIGGER FINGER/A-1 PULLEY LEFT RING FINGER;  Surgeon: Tami RibasKevin R Kuzma, MD;  Location: [facility];  Service: Orthopedics;  Laterality: Left;    Medications: Current Outpatient Medications  Medication Sig Dispense Refill   ALPRAZolam (XANAX) 1 MG tablet TAKE 1 TABLET BY MOUTH TWICE A DAY AS NEEDED FOR ANXIETY OR SLEEP 60 tablet 0   amLODipine (NORVASC) 10 MG tablet Take 1 tablet (10 mg total) by mouth at bedtime. 30 tablet 1   atorvastatin (LIPITOR) 20 MG tablet Take 1 tablet (20 mg total) by mouth at bedtime. 30 tablet 1   buPROPion (WELLBUTRIN XL) 150 MG 24 hr tablet Take one tablet in the morning and one tablet at lunch. 60 tablet 5   colestipol (COLESTID) 1 g  tablet Take 2 g by mouth 2 (two) times daily.     lamoTRIgine (LAMICTAL) 200 MG tablet Take 1 tablet (200 mg total) by mouth 2 (two) times daily. 60 tablet 5   Multiple Vitamins-Minerals (MULTIVITAMIN WITH MINERALS) tablet Take 1 tablet by mouth daily.     senna-docusate (SENOKOT-S) 8.6-50 MG tablet Take 1 tablet by mouth at bedtime as needed for mild constipation.     sertraline (ZOLOFT) 100 MG tablet Take 1 tablet (100 mg total) by mouth 2 (two) times daily. 60 tablet 5   valbenazine (INGREZZA) 40 MG capsule Take 1 capsule (40 mg total) by mouth at bedtime. 30 capsule 11   No current facility-administered medications for this visit.    Allergies  Allergen Reactions   Latuda [Lurasidone] Other (See Comments)    Increased suicidal thoughts    Aripiprazole Other (See Comments)    Tardive Dyskinesia Other reaction(s): tardive dyskinesia   Bacitracin-Polymyxin B     Other reaction(s): rash  Losartan Potassium     Other reaction(s): decline gfr   Neo-Bacit-Poly-Lidocaine    Quetiapine Other (See Comments)    Tardive Dyskinesia Other reaction(s): tardive dyskinesia    Diagnoses:  Bipolar II disorder  PTSD (post-traumatic stress disorder)  Plan of Care: Will develop care plan during next visit.   Disaya Walt G Shaketa Serafin, LCSW

## 2023-03-01 DIAGNOSIS — K529 Noninfective gastroenteritis and colitis, unspecified: Secondary | ICD-10-CM | POA: Diagnosis not present

## 2023-03-04 ENCOUNTER — Telehealth: Payer: Self-pay

## 2023-03-04 NOTE — Telephone Encounter (Signed)
Received a re-enrollment fax with  [employer] support program for her patient assistance program. Pt receives her medication directly from [place] due to no pharmacy insurance. Will contact pt to make sure she received her paper work and will submit an updated Rx for Ingrezza for her.

## 2023-03-05 ENCOUNTER — Ambulatory Visit (INDEPENDENT_AMBULATORY_CARE_PROVIDER_SITE_OTHER): Payer: Medicare Other | Admitting: Psychology

## 2023-03-05 DIAGNOSIS — F4310 Post-traumatic stress disorder, unspecified: Secondary | ICD-10-CM

## 2023-03-05 DIAGNOSIS — F3181 Bipolar II disorder: Secondary | ICD-10-CM

## 2023-03-05 DIAGNOSIS — F411 Generalized anxiety disorder: Secondary | ICD-10-CM | POA: Diagnosis not present

## 2023-03-05 NOTE — Telephone Encounter (Signed)
Contacted pt about her Pt. Assistance Program for [employer], there have been no changes with her insurance coverage so she would quality another year for this program. Will fax Rx to [employer].

## 2023-03-06 NOTE — Telephone Encounter (Signed)
Paper work faxed to [employer] for [employer] 40 mg.

## 2023-03-06 NOTE — Progress Notes (Signed)
[facility] Counselor/Therapist Progress Note  Patient ID: [name] [name], MRN: [id number],    Date: 03/05/2023  Time Spent: 60 minutes  Treatment Type: Individual Therapy  Reported Symptoms: Issues with reacting to her husband.  Mental Status Exam: Appearance:  Casual     Behavior: Appropriate  Motor: Shuffling Gait  Speech/Language:  Clear and Coherent  Affect: Blunt  Mood: sad  Thought process: normal  Thought content:   WNL  Sensory/Perceptual disturbances:   WNL  Orientation: oriented to person, place, time/date, and situation  Attention: Good  Concentration: Good  Memory: WNL  Fund of knowledge:  Good  Insight:   Good  Judgment:  Good  Impulse Control: Good   Risk Assessment: Danger to Self:  No Self-injurious Behavior: No Danger to Others: No Duty to Warn:no Physical Aggression / Violence:No  Access to Firearms a concern: No  Gang Involvement:No   Subjective: The patient attended an individual therapy session in the office today.  The patient presents with a blunted affect and mood is sad.  We talked more today about her family history and it seems that bipolar disorder has been a theme throughout her family.  In addition she gave me more information about her sister and relationships with her mother and father.  We also talked about her relationship with her daughter and son.  We identified several things to focus on with the EMDR moving forward.  During the next session we will talk about the process of EMDR and start identifying the negative cognitions to use and also the positive cognitions that we would like to install.  Interventions: Cognitive Behavioral Therapy, Eye Movement Desensitization and Reprocessing (EMDR), and Insight-Oriented  Diagnosis:Bipolar II disorder  PTSD (post-traumatic stress disorder)  Generalized anxiety disorder  Plan: Treatment Plan Client Abilities/Strengths  Insightful, motivated, supportive husband  Client  Treatment Preferences  Outpatient Individual therapy/EMDR  Client Statement of Needs  "I sometimes react and need help with my PTSD from childhood."  Treatment Level  Outpaeint Individual therapy  Symptoms  Demonstrates an exaggerated startle response.: No Description Entered (Status: improved). Depressed  or irritable mood.:  (Status: maintained). Displays a  significant decline in interest and engagement in activities.: (Status: maintained). Displays significant psychological and/or physiological distress resulting from internal and external  clues that are reminiscent of the traumatic event.: No Description Entered (Status: improved). . Experiences disturbing  and persistent thoughts, images, and/or perceptions of the traumatic event.:  (Status: maintained).  Feelings of hopelessness, worthlessness, or inappropriate guilt.: (Status: maintained). Has been exposed to a traumatic event involving actual or perceived threat of death or  serious injury.: (Status: maintained). Impairment in social, occupational, or  other areas of functioning.: (Status:maintained). Intentionally avoids activities,  places, people, or objects (e.g., up-armored vehicles) that evoke memories of the event.:  (Status: maintained). Intentionally avoids thoughts, feelings, or discussions related  to the traumatic event.: (Status: maintained). Reports difficulty concentrating as  well as feelings of guilt.:  (Status: maintained). Reports response of intense fear,  helplessness, or horror to the traumatic event.: (Status: maintained).  Problems Addressed   Posttraumatic Stress Disorder (PTSD), Posttraumatic Stress Disorder (PTSD),   Posttraumatic Stress Disorder (PTSD), Posttraumatic Stress Disorder (PTSD)  Goals 1. Develop healthy thinking patterns and beliefs about self, others, and the world that lead to the alleviation and help prevent the relapse of  depression. Objective Identify and replace thoughts and  beliefs that support depression. Target Date:03/04/2024 Frequency: Weekly Progress: 0 Modality: individual Related Interventions 1. Explore and  restructure underlying assumptions and beliefs reflected in biased self-talk that  may put the client at risk for relapse or recurrence. 2. Conduct Cognitive-Behavioral Therapy (see Cognitive Behavior Therapy by Reola Calkins; Overcoming Depression by Agapito Games al.), beginning with helping the client learn the connection among  cognition, depressive feelings, and actions. 2. Eliminate or reduce the negative impact trauma related symptoms have  on social, occupational, and family functioning. Objective Learn and implement personal skills to manage challenging situations related to trauma. Target Date: 03/04/2024 Frequency: Weekly Progress: 0 Modality: individual 3. No longer avoids persons, places, activities, and objects that are  reminiscent of the traumatic event. Objective Participate in Eye Movement Desensitization and Reprocessing (EMDR) to reduce emotional distress  related to traumatic thoughts, feelings, and images. Target Date: 03/04/2024 Frequency: Weekly Progress: 0 Modality: individual  Related Interventions 1. Utilize Eye Movement Desensitization and Reprocessing (EMDR) to reduce the client's  emotional reactivity to the traumatic event and reduce PTSD symptoms. Objective Learn and implement guided self-dialogue to manage thoughts, feelings, and urges brought on by  encounters with trauma-related situations. Target Date: 03/04/2024 Frequency: Weekly Progress: 0 Modality: individual Related Interventions 1. Teach the client a guided self-dialogue procedure in which he/she learns to recognize  maladaptive self-talk, challenges its biases, copes with engendered feelings, overcomes  avoidance, and reinforces his/her accomplishments; review and reinforce progress, problemsolve obstacles. 4. No longer experiences intrusive event  recollections, avoidance of event  reminders, intense arousal, or disinterest in activities or  relationships. 5. Thinks about or openly discusses the traumatic event with others  without experiencing psychological or physiological distress. Diagnosis F43.10 (Posttraumatic stress disorder) - Open - [Signifier: n/a] Posttraumatic Stress  Disorder  Conditions For Discharge Achievement of treatment goals and objectives   Alexiana Laverdure G Renard Caperton, LCSW

## 2023-03-12 ENCOUNTER — Ambulatory Visit (INDEPENDENT_AMBULATORY_CARE_PROVIDER_SITE_OTHER): Payer: Medicare Other | Admitting: Psychology

## 2023-03-12 DIAGNOSIS — F4310 Post-traumatic stress disorder, unspecified: Secondary | ICD-10-CM

## 2023-03-12 DIAGNOSIS — F3181 Bipolar II disorder: Secondary | ICD-10-CM | POA: Diagnosis not present

## 2023-03-12 DIAGNOSIS — F411 Generalized anxiety disorder: Secondary | ICD-10-CM | POA: Diagnosis not present

## 2023-03-13 NOTE — Progress Notes (Signed)
[facility] Counselor/Therapist Progress Note  Patient ID: ANETHA SLAGEL, MRN: [id number],    Date: 03/12/2023  Time Spent: 60 minutes  Treatment Type: Individual Therapy  Reported Symptoms: Issues with reacting to her husband.  Mental Status Exam: Appearance:  Casual     Behavior: Appropriate  Motor: Shuffling Gait  Speech/Language:  Clear and Coherent  Affect: Blunt  Mood: sad  Thought process: normal  Thought content:   WNL  Sensory/Perceptual disturbances:   WNL  Orientation: oriented to person, place, time/date, and situation  Attention: Good  Concentration: Good  Memory: WNL  Fund of knowledge:  Good  Insight:   Good  Judgment:  Good  Impulse Control: Good   Risk Assessment: Danger to Self:  No Self-injurious Behavior: No Danger to Others: No Duty to Warn:no Physical Aggression / Violence:No  Access to Firearms a concern: No  Gang Involvement:No   Subjective: The patient attended an individual therapy session in the office today.  The patient presents with a blunted affect and mood is sad.  The patient talked more today about her relationship with her mother and her father her sister and some extended family.  We are going to work more on EMDR and what it is that we are going to target the next time that I see her.  She felt that she needed to give a little more history and that is what she did today when we were discussing where she got some of her reactivity from.  It seems that her sister turned out to be borderline personality disorder and she struggled because her mother did not have a whole lot of emotion either way and her sister was out of control.  We will begin identifying specific negative cognitions and positive cognitions during our next session.  Provided cognitive behavioral therapy and insight oriented therapy today as well as psychoeducation. Interventions: Cognitive Behavioral Therapy, Eye Movement Desensitization and Reprocessing  (EMDR), and Insight-Oriented  Diagnosis:Bipolar II disorder  PTSD (post-traumatic stress disorder)  Generalized anxiety disorder  Plan: Treatment Plan Client Abilities/Strengths  Insightful, motivated, supportive husband  Client Treatment Preferences  Outpatient Individual therapy/EMDR  Client Statement of Needs  "I sometimes react and need help with my PTSD from childhood."  Treatment Level  Outpaeint Individual therapy  Symptoms  Demonstrates an exaggerated startle response.: No Description Entered (Status: improved). Depressed  or irritable mood.:  (Status: maintained). Displays a  significant decline in interest and engagement in activities.: (Status: maintained). Displays significant psychological and/or physiological distress resulting from internal and external  clues that are reminiscent of the traumatic event.: No Description Entered (Status: improved). . Experiences disturbing  and persistent thoughts, images, and/or perceptions of the traumatic event.:  (Status: maintained).  Feelings of hopelessness, worthlessness, or inappropriate guilt.: (Status: maintained). Has been exposed to a traumatic event involving actual or perceived threat of death or  serious injury.: (Status: maintained). Impairment in social, occupational, or  other areas of functioning.: (Status:maintained). Intentionally avoids activities,  places, people, or objects (e.g., up-armored vehicles) that evoke memories of the event.:  (Status: maintained). Intentionally avoids thoughts, feelings, or discussions related  to the traumatic event.: (Status: maintained). Reports difficulty concentrating as  well as feelings of guilt.:  (Status: maintained). Reports response of intense fear,  helplessness, or horror to the traumatic event.: (Status: maintained).  Problems Addressed   Posttraumatic Stress Disorder (PTSD), Posttraumatic Stress Disorder (PTSD),   Posttraumatic Stress Disorder (PTSD), Posttraumatic  Stress Disorder (PTSD)  Goals 1. Develop  healthy thinking patterns and beliefs about self, others, and the world that lead to the alleviation and help prevent the relapse of  depression. Objective Identify and replace thoughts and beliefs that support depression. Target Date:03/04/2024 Frequency: Weekly Progress: 0 Modality: individual Related Interventions 1. Explore and restructure underlying assumptions and beliefs reflected in biased self-talk that  may put the client at risk for relapse or recurrence. 2. Conduct Cognitive-Behavioral Therapy (see Cognitive Behavior Therapy by Reola Calkins; Overcoming Depression by Agapito Games al.), beginning with helping the client learn the connection among  cognition, depressive feelings, and actions. 2. Eliminate or reduce the negative impact trauma related symptoms have  on social, occupational, and family functioning. Objective Learn and implement personal skills to manage challenging situations related to trauma. Target Date: 03/04/2024 Frequency: Weekly Progress: 0 Modality: individual 3. No longer avoids persons, places, activities, and objects that are  reminiscent of the traumatic event. Objective Participate in Eye Movement Desensitization and Reprocessing (EMDR) to reduce emotional distress  related to traumatic thoughts, feelings, and images. Target Date: 03/04/2024 Frequency: Weekly Progress: 0 Modality: individual  Related Interventions 1. Utilize Eye Movement Desensitization and Reprocessing (EMDR) to reduce the client's  emotional reactivity to the traumatic event and reduce PTSD symptoms. Objective Learn and implement guided self-dialogue to manage thoughts, feelings, and urges brought on by  encounters with trauma-related situations. Target Date: 03/04/2024 Frequency: Weekly Progress: 0 Modality: individual Related Interventions 1. Teach the client a guided self-dialogue procedure in which he/she learns to recognize  maladaptive  self-talk, challenges its biases, copes with engendered feelings, overcomes  avoidance, and reinforces his/her accomplishments; review and reinforce progress, problemsolve obstacles. 4. No longer experiences intrusive event recollections, avoidance of event  reminders, intense arousal, or disinterest in activities or  relationships. 5. Thinks about or openly discusses the traumatic event with others  without experiencing psychological or physiological distress. Diagnosis F43.10 (Posttraumatic stress disorder) - Open - [Signifier: n/a] Posttraumatic Stress  Disorder  Conditions For Discharge Achievement of treatment goals and objectives   Krisinda Giovanni G [place], LCSW

## 2023-03-17 ENCOUNTER — Encounter: Payer: Self-pay | Admitting: Adult Health

## 2023-03-17 ENCOUNTER — Ambulatory Visit (INDEPENDENT_AMBULATORY_CARE_PROVIDER_SITE_OTHER): Payer: Medicare Other | Admitting: Adult Health

## 2023-03-17 ENCOUNTER — Ambulatory Visit (INDEPENDENT_AMBULATORY_CARE_PROVIDER_SITE_OTHER): Payer: Medicare Other

## 2023-03-17 DIAGNOSIS — F4310 Post-traumatic stress disorder, unspecified: Secondary | ICD-10-CM | POA: Diagnosis not present

## 2023-03-17 DIAGNOSIS — G4700 Insomnia, unspecified: Secondary | ICD-10-CM | POA: Diagnosis not present

## 2023-03-17 DIAGNOSIS — F411 Generalized anxiety disorder: Secondary | ICD-10-CM

## 2023-03-17 DIAGNOSIS — F3181 Bipolar II disorder: Secondary | ICD-10-CM | POA: Diagnosis not present

## 2023-03-17 DIAGNOSIS — I4729 Other ventricular tachycardia: Secondary | ICD-10-CM | POA: Diagnosis not present

## 2023-03-17 DIAGNOSIS — G2401 Drug induced subacute dyskinesia: Secondary | ICD-10-CM | POA: Diagnosis not present

## 2023-03-17 LAB — CUP PACEART REMOTE DEVICE CHECK
Date Time Interrogation Session: 20240426230017
Implantable Pulse Generator Implant Date: 20220411

## 2023-03-17 MED ORDER — SERTRALINE HCL 100 MG PO TABS: 100.0000 mg | ORAL_TABLET | Freq: Two times a day (BID) | ORAL | 5 refills | Status: DC

## 2023-03-17 MED ORDER — ALPRAZOLAM 1 MG PO TABS: ORAL_TABLET | ORAL | 2 refills | Status: DC

## 2023-03-17 MED ORDER — BUPROPION HCL ER (XL) 150 MG PO TB24: ORAL_TABLET | ORAL | 5 refills | Status: DC

## 2023-03-17 MED ORDER — LAMOTRIGINE 200 MG PO TABS
200.0000 mg | ORAL_TABLET | Freq: Two times a day (BID) | ORAL | 5 refills | Status: DC
Start: 2023-03-17 — End: 2023-08-29

## 2023-03-17 NOTE — Progress Notes (Signed)
TOSHI ISHII [id number] Aug 20, 1946 77 y.o.  Subjective:   Patient ID:  [name] [name] is a 77 y.o. (DOB 07/14/1946) female.  Chief Complaint: No chief complaint on file.   HPI [name] [name] presents to the office today for follow-up of BPD-2, GAD, insomnia, and TD.  Referred by Baltazar Apo for medication evaluation.  Describes mood today as "ok". Pleasant. Denies tearfulness. Mood symptoms - reports decreased depression and anxiety. Reports some irritability - related to husband - working on that. Denies worry, rumination, and over thinking. Mood is stable. Stating "I don't have the joy I would like to have out of life".  Feels like current medications are helpful and does not wish to make any changes. Varying interest and motivation. Taking medications as prescribed.  Energy levels lower - has been since Covid.. Active, does not have a regular exercise routine.  Enjoys some usual interests and activities. Married. Lives with husband. Has 2 grown children. Spending time with family and friends. Appetite adequate. Weight fluctuates. Sleep is variable - averages 6 hours Focus and concentration stable. Completing tasks. Managing aspects of household. Retired - Engineer, site.  Denies SI or HI.  Denies AH or VH. Denies self harm. Denies substance use.  Previous medication trials: Abilify, Seroquel, Latuda, Geodon    Flowsheet Row ED to Hosp-Admission (Discharged) from 09/18/2022 in [facility] from [facility]/22/2022 in [facility]  C-SSRS RISK CATEGORY No Risk No Risk        Review of Systems:  Review of Systems  Musculoskeletal:  Negative for gait problem.  Neurological:  Negative for tremors.  Psychiatric/Behavioral:         Please refer to HPI    Medications: I have reviewed the patient's current medications.  Current Outpatient Medications  Medication Sig Dispense Refill   ALPRAZolam (XANAX) 1 MG tablet TAKE  1 TABLET BY MOUTH TWICE A DAY AS NEEDED FOR ANXIETY OR SLEEP 60 tablet 2   amLODipine (NORVASC) 10 MG tablet Take 1 tablet (10 mg total) by mouth at bedtime. 30 tablet 1   atorvastatin (LIPITOR) 20 MG tablet Take 1 tablet (20 mg total) by mouth at bedtime. 30 tablet 1   buPROPion (WELLBUTRIN XL) 150 MG 24 hr tablet Take one tablet in the morning and one tablet at lunch. 60 tablet 5   colestipol (COLESTID) 1 g tablet Take 2 g by mouth 2 (two) times daily.     lamoTRIgine (LAMICTAL) 200 MG tablet Take 1 tablet (200 mg total) by mouth 2 (two) times daily. 60 tablet 5   Multiple Vitamins-Minerals (MULTIVITAMIN WITH MINERALS) tablet Take 1 tablet by mouth daily.     senna-docusate (SENOKOT-S) 8.6-50 MG tablet Take 1 tablet by mouth at bedtime as needed for mild constipation.     sertraline (ZOLOFT) 100 MG tablet Take 1 tablet (100 mg total) by mouth 2 (two) times daily. 60 tablet 5   valbenazine (INGREZZA) 40 MG capsule Take 1 capsule (40 mg total) by mouth at bedtime. 30 capsule 11   No current facility-administered medications for this visit.    Medication Side Effects: None  Allergies:  Allergies  Allergen Reactions   Latuda [Lurasidone] Other (See Comments)    Increased suicidal thoughts    Aripiprazole Other (See Comments)    Tardive Dyskinesia Other reaction(s): tardive dyskinesia   Bacitracin-Polymyxin B     Other reaction(s): rash   Losartan Potassium     Other reaction(s): decline gfr  Neo-Bacit-Poly-Lidocaine    Quetiapine Other (See Comments)    Tardive Dyskinesia Other reaction(s): tardive dyskinesia    Past Medical History:  Diagnosis Date   Allergic rhinitis    Bipolar disorder (HCC)    Cholelithiasis    seen on CAT 01/04/09   Chronic renal disease, stage III (HCC)    3/12   Complication of anesthesia    versed and fentanyl did not work for colonoscopy   Diabetic peripheral neuropathy (HCC) 07/18/2020   DM (diabetes mellitus) (HCC)    Edema    Gait abnormality  08/26/2019   HLD (hyperlipidemia)    HTN (hypertension)    Iron deficiency anemia    3/12    Lymphedema    tarda   Neuropathy    Obstructive sleep apnea on CPAP    Osteoarthritis    Renal cyst    2.7 cm seen on CAT 01/04/09   Tardive dyskinesia     Past Medical History, Surgical history, Social history, and Family history were reviewed and updated as appropriate.   Please see review of systems for further details on the patient's review from today.   Objective:   Physical Exam:  There were no vitals taken for this visit.  Physical Exam Constitutional:      General: She is not in acute distress. Musculoskeletal:        General: No deformity.  Neurological:     Mental Status: She is alert and oriented to person, place, and time.     Coordination: Coordination normal.  Psychiatric:        Attention and Perception: Attention and perception normal. She does not perceive auditory or visual hallucinations.        Mood and Affect: Mood normal. Mood is not anxious or depressed. Affect is not labile, blunt, angry or inappropriate.        Speech: Speech normal.        Behavior: Behavior normal.        Thought Content: Thought content normal. Thought content is not paranoid or delusional. Thought content does not include homicidal or suicidal ideation. Thought content does not include homicidal or suicidal plan.        Cognition and Memory: Cognition and memory normal.        Judgment: Judgment normal.     Comments: Insight intact     Lab Review:     Component Value Date/Time   NA 138 09/21/2022 0349   K 4.2 09/21/2022 0349   CL 106 09/21/2022 0349   CO2 22 09/21/2022 0349   GLUCOSE 99 09/21/2022 0349   BUN 19 09/21/2022 0349   CREATININE 1.20 (H) 09/21/2022 0349   CALCIUM 9.5 09/21/2022 0349   PROT 7.1 09/18/2022 1309   PROT 7.0 01/05/2020 1035   ALBUMIN 3.9 09/18/2022 1309   AST 21 09/18/2022 1309   ALT 15 09/18/2022 1309   ALKPHOS 53 09/18/2022 1309   BILITOT 0.6  09/18/2022 1309   GFRNONAA 47 (L) 09/21/2022 0349   GFRAA 47 (L) 12/06/2019 2242       Component Value Date/Time   WBC 7.3 09/21/2022 0349   RBC 3.76 (L) 09/21/2022 0349   HGB 12.9 09/21/2022 0349   HCT 37.8 09/21/2022 0349   PLT 156 09/21/2022 0349   MCV 100.5 (H) 09/21/2022 0349   MCH 34.3 (H) 09/21/2022 0349   MCHC 34.1 09/21/2022 0349   RDW 12.5 09/21/2022 0349   LYMPHSABS 0.7 10/31/2020 1107   MONOABS 0.5 10/31/2020 1107  EOSABS 0.2 10/31/2020 1107   BASOSABS 0.0 10/31/2020 1107    No results found for: "POCLITH", "LITHIUM"   No results found for: "PHENYTOIN", "PHENOBARB", "VALPROATE", "CBMZ"   .res Assessment: Plan:   Plan:  PDMP reviewed  Ingrezza 40mg  daily  Zoloft 100mg  - 2 daily Lamictal 200mg  BID Wellbutrin XL 300mg  daily Xanax 1mg  BID for anxiety  Patient seen for 25 minutes and time spent discussing treatment options.  RTC 6 months  Therapist - Bambi Cottle - trauma  Patient advised to contact office with any questions, adverse effects, or acute worsening in signs and symptoms.  Discussed potential benefits, risk, and side effects of benzodiazepines to include potential risk of tolerance and dependence, as well as possible drowsiness.  Advised patient not to drive if experiencing drowsiness and to take lowest possible effective dose to minimize risk of dependence and tolerance.   Diagnoses and all orders for this visit:  Bipolar II disorder (HCC) -     sertraline (ZOLOFT) 100 MG tablet; Take 1 tablet (100 mg total) by mouth 2 (two) times daily. -     buPROPion (WELLBUTRIN XL) 150 MG 24 hr tablet; Take one tablet in the morning and one tablet at lunch. -     lamoTRIgine (LAMICTAL) 200 MG tablet; Take 1 tablet (200 mg total) by mouth 2 (two) times daily.  Generalized anxiety disorder -     ALPRAZolam (XANAX) 1 MG tablet; TAKE 1 TABLET BY MOUTH TWICE A DAY AS NEEDED FOR ANXIETY OR SLEEP -     sertraline (ZOLOFT) 100 MG tablet; Take 1 tablet (100 mg  total) by mouth 2 (two) times daily.  PTSD (post-traumatic stress disorder)  Tardive dyskinesia  Insomnia, unspecified type     Please see After Visit Summary for patient specific instructions.  Future Appointments  Date Time Provider Department Center  03/19/2023 11:00 AM Cottle, Lynnell Dike, LCSW [facility] None  03/26/2023 11:00 AM Cottle, Bambi G, LCSW [facility] None  04/02/2023  2:00 PM Cottle, Bambi G, LCSW [facility] None  04/09/2023 11:00 AM Cottle, Bambi G, LCSW [facility] None  04/16/2023 11:00 AM Cottle, Bambi G, LCSW [facility] None    No orders of the defined types were placed in this encounter.   -------------------------------

## 2023-03-19 ENCOUNTER — Ambulatory Visit (INDEPENDENT_AMBULATORY_CARE_PROVIDER_SITE_OTHER): Payer: Medicare Other | Admitting: Psychology

## 2023-03-19 DIAGNOSIS — F3181 Bipolar II disorder: Secondary | ICD-10-CM | POA: Diagnosis not present

## 2023-03-19 DIAGNOSIS — F4310 Post-traumatic stress disorder, unspecified: Secondary | ICD-10-CM | POA: Diagnosis not present

## 2023-03-19 DIAGNOSIS — F411 Generalized anxiety disorder: Secondary | ICD-10-CM | POA: Diagnosis not present

## 2023-03-19 NOTE — Progress Notes (Signed)
[facility] Counselor/Therapist Progress Note  Patient ID: ARRIAH WADLE, MRN: [id number],    Date: 03/19/2023  Time Spent: 60 minutes  Treatment Type: Individual Therapy  Reported Symptoms: Issues with reacting to her husband.  Mental Status Exam: Appearance:  Casual     Behavior: Appropriate  Motor: Shuffling Gait  Speech/Language:  Clear and Coherent  Affect: Blunt  Mood: sad  Thought process: normal  Thought content:   WNL  Sensory/Perceptual disturbances:   WNL  Orientation: oriented to person, place, time/date, and situation  Attention: Good  Concentration: Good  Memory: WNL  Fund of knowledge:  Good  Insight:   Good  Judgment:  Good  Impulse Control: Good   Risk Assessment: Danger to Self:  No Self-injurious Behavior: No Danger to Others: No Duty to Warn:no Physical Aggression / Violence:No  Access to Firearms a concern: No  Gang Involvement:No   Subjective: The patient attended an individual therapy session in the office today.  The patient presents with a blunted affect and mood is sad.  Today we talked more about the process of EMDR and we did the safe place exercise and the container exercise.  The patient chose to use the tactile buzzers for the dual brain stimulation.  We talked more about the sequence of how we are going to work with her trauma.  We also talked about why she is so reactive and that we will be able to address things as they come up with this therapy.  The patient understood the concepts discussed.  Provided psychoeducation and EMDR.  Interventions: Cognitive Behavioral Therapy, Eye Movement Desensitization and Reprocessing (EMDR), and Insight-Oriented  Diagnosis:Bipolar II disorder (HCC)  Generalized anxiety disorder  PTSD (post-traumatic stress disorder)  Plan: Treatment Plan Client Abilities/Strengths  Insightful, motivated, supportive husband  Client Treatment Preferences  Outpatient Individual therapy/EMDR   Client Statement of Needs  "I sometimes react and need help with my PTSD from childhood."  Treatment Level  Outpaeint Individual therapy  Symptoms  Demonstrates an exaggerated startle response.: No Description Entered (Status: improved). Depressed  or irritable mood.:  (Status: maintained). Displays a  significant decline in interest and engagement in activities.: (Status: maintained). Displays significant psychological and/or physiological distress resulting from internal and external  clues that are reminiscent of the traumatic event.: No Description Entered (Status: improved). . Experiences disturbing  and persistent thoughts, images, and/or perceptions of the traumatic event.:  (Status: maintained).  Feelings of hopelessness, worthlessness, or inappropriate guilt.: (Status: maintained). Has been exposed to a traumatic event involving actual or perceived threat of death or  serious injury.: (Status: maintained). Impairment in social, occupational, or  other areas of functioning.: (Status:maintained). Intentionally avoids activities,  places, people, or objects (e.g., up-armored vehicles) that evoke memories of the event.:  (Status: maintained). Intentionally avoids thoughts, feelings, or discussions related  to the traumatic event.: (Status: maintained). Reports difficulty concentrating as  well as feelings of guilt.:  (Status: maintained). Reports response of intense fear,  helplessness, or horror to the traumatic event.: (Status: maintained).  Problems Addressed   Posttraumatic Stress Disorder (PTSD), Posttraumatic Stress Disorder (PTSD),   Posttraumatic Stress Disorder (PTSD), Posttraumatic Stress Disorder (PTSD)  Goals 1. Develop healthy thinking patterns and beliefs about self, others, and the world that lead to the alleviation and help prevent the relapse of  depression. Objective Identify and replace thoughts and beliefs that support depression. Target Date:03/04/2024  Frequency: Weekly Progress: 0 Modality: individual Related Interventions 1. Explore and restructure underlying assumptions and beliefs reflected  in biased self-talk that  may put the client at risk for relapse or recurrence. 2. Conduct Cognitive-Behavioral Therapy (see Cognitive Behavior Therapy by Reola Calkins; Overcoming Depression by Agapito Games al.), beginning with helping the client learn the connection among  cognition, depressive feelings, and actions. 2. Eliminate or reduce the negative impact trauma related symptoms have  on social, occupational, and family functioning. Objective Learn and implement personal skills to manage challenging situations related to trauma. Target Date: 03/04/2024 Frequency: Weekly Progress: 0 Modality: individual 3. No longer avoids persons, places, activities, and objects that are  reminiscent of the traumatic event. Objective Participate in Eye Movement Desensitization and Reprocessing (EMDR) to reduce emotional distress  related to traumatic thoughts, feelings, and images. Target Date: 03/04/2024 Frequency: Weekly Progress: 0 Modality: individual  Related Interventions 1. Utilize Eye Movement Desensitization and Reprocessing (EMDR) to reduce the client's  emotional reactivity to the traumatic event and reduce PTSD symptoms. Objective Learn and implement guided self-dialogue to manage thoughts, feelings, and urges brought on by  encounters with trauma-related situations. Target Date: 03/04/2024 Frequency: Weekly Progress: 0 Modality: individual Related Interventions 1. Teach the client a guided self-dialogue procedure in which he/she learns to recognize  maladaptive self-talk, challenges its biases, copes with engendered feelings, overcomes  avoidance, and reinforces his/her accomplishments; review and reinforce progress, problemsolve obstacles. 4. No longer experiences intrusive event recollections, avoidance of event  reminders, intense arousal, or  disinterest in activities or  relationships. 5. Thinks about or openly discusses the traumatic event with others  without experiencing psychological or physiological distress. Diagnosis F43.10 (Posttraumatic stress disorder) - Open - [Signifier: n/a] Posttraumatic Stress  Disorder  Conditions For Discharge Achievement of treatment goals and objectives   Shantika Bermea G Shyvonne Chastang, LCSW

## 2023-03-24 NOTE — Progress Notes (Signed)
[facility] Summary Report / Loop Recorder 

## 2023-03-25 ENCOUNTER — Other Ambulatory Visit: Payer: Self-pay | Admitting: Adult Health

## 2023-03-25 DIAGNOSIS — F411 Generalized anxiety disorder: Secondary | ICD-10-CM

## 2023-03-26 ENCOUNTER — Ambulatory Visit (INDEPENDENT_AMBULATORY_CARE_PROVIDER_SITE_OTHER): Payer: Medicare Other | Admitting: Psychology

## 2023-03-26 DIAGNOSIS — F4310 Post-traumatic stress disorder, unspecified: Secondary | ICD-10-CM

## 2023-03-26 DIAGNOSIS — F3181 Bipolar II disorder: Secondary | ICD-10-CM | POA: Diagnosis not present

## 2023-03-26 DIAGNOSIS — F411 Generalized anxiety disorder: Secondary | ICD-10-CM

## 2023-03-27 NOTE — Progress Notes (Signed)
[facility] Counselor/Therapist Progress Note  Patient ID: LENCY BLANKENBILLER, MRN: [id number],    Date: 03/26/2023  Time Spent: 60 minutes  Treatment Type: Individual Therapy  Reported Symptoms: Issues with reacting to her husband.  Mental Status Exam: Appearance:  Casual     Behavior: Appropriate  Motor: Shuffling Gait  Speech/Language:  Clear and Coherent  Affect: Blunt  Mood: sad  Thought process: normal  Thought content:   WNL  Sensory/Perceptual disturbances:   WNL  Orientation: oriented to person, place, time/date, and situation  Attention: Good  Concentration: Good  Memory: WNL  Fund of knowledge:  Good  Insight:   Good  Judgment:  Good  Impulse Control: Good   Risk Assessment: Danger to Self:  No Self-injurious Behavior: No Danger to Others: No Duty to Warn:no Physical Aggression / Violence:No  Access to Firearms a concern: No  Gang Involvement:No   Subjective: The patient attended an individual therapy session in the office today.  The patient presents with a blunted affect and mood is sad.  The patient reports that she has been feeling a little sad this week.  I explained to her that we have been pulling up things that probably were unpleasant for her and that makes sense that she would be struggling a little bit with sadness.  We started looking at the cognitions that we wanted to use with her picture of her mother and we talked a little bit about how her relationship with her mother is manic in her relationship with her husband.  We talked about figuring out exactly the right cognition that we want to look at when doing EMDR around her mother and the other things that we need to focus on.  We identified the negative cognition as "I do not deserve love".  The positive cognition that we came up with so far is "I can accept love that is offered".  We will start doing the actual EMDR next week when she returns. Interventions: Cognitive Behavioral  Therapy, Eye Movement Desensitization and Reprocessing (EMDR), and Insight-Oriented  Diagnosis:Bipolar II disorder (HCC)  Generalized anxiety disorder  PTSD (post-traumatic stress disorder)  Plan: Treatment Plan Client Abilities/Strengths  Insightful, motivated, supportive husband  Client Treatment Preferences  Outpatient Individual therapy/EMDR  Client Statement of Needs  "I sometimes react and need help with my PTSD from childhood."  Treatment Level  Outpaeint Individual therapy  Symptoms  Demonstrates an exaggerated startle response.: No Description Entered (Status: improved). Depressed  or irritable mood.:  (Status: maintained). Displays a  significant decline in interest and engagement in activities.: (Status: maintained). Displays significant psychological and/or physiological distress resulting from internal and external  clues that are reminiscent of the traumatic event.: No Description Entered (Status: improved). . Experiences disturbing  and persistent thoughts, images, and/or perceptions of the traumatic event.:  (Status: maintained).  Feelings of hopelessness, worthlessness, or inappropriate guilt.: (Status: maintained). Has been exposed to a traumatic event involving actual or perceived threat of death or  serious injury.: (Status: maintained). Impairment in social, occupational, or  other areas of functioning.: (Status:maintained). Intentionally avoids activities,  places, people, or objects (e.g., up-armored vehicles) that evoke memories of the event.:  (Status: maintained). Intentionally avoids thoughts, feelings, or discussions related  to the traumatic event.: (Status: maintained). Reports difficulty concentrating as  well as feelings of guilt.:  (Status: maintained). Reports response of intense fear,  helplessness, or horror to the traumatic event.: (Status: maintained).  Problems Addressed   Posttraumatic Stress  Disorder (PTSD), Posttraumatic Stress Disorder  (PTSD),   Posttraumatic Stress Disorder (PTSD), Posttraumatic Stress Disorder (PTSD)  Goals 1. Develop healthy thinking patterns and beliefs about self, others, and the world that lead to the alleviation and help prevent the relapse of  depression. Objective Identify and replace thoughts and beliefs that support depression. Target Date:03/04/2024 Frequency: Weekly Progress: 0 Modality: individual Related Interventions 1. Explore and restructure underlying assumptions and beliefs reflected in biased self-talk that  may put the client at risk for relapse or recurrence. 2. Conduct Cognitive-Behavioral Therapy (see Cognitive Behavior Therapy by Reola Calkins; Overcoming Depression by Agapito Games al.), beginning with helping the client learn the connection among  cognition, depressive feelings, and actions. 2. Eliminate or reduce the negative impact trauma related symptoms have  on social, occupational, and family functioning. Objective Learn and implement personal skills to manage challenging situations related to trauma. Target Date: 03/04/2024 Frequency: Weekly Progress: 0 Modality: individual 3. No longer avoids persons, places, activities, and objects that are  reminiscent of the traumatic event. Objective Participate in Eye Movement Desensitization and Reprocessing (EMDR) to reduce emotional distress  related to traumatic thoughts, feelings, and images. Target Date: 03/04/2024 Frequency: Weekly Progress: 0 Modality: individual  Related Interventions 1. Utilize Eye Movement Desensitization and Reprocessing (EMDR) to reduce the client's  emotional reactivity to the traumatic event and reduce PTSD symptoms. Objective Learn and implement guided self-dialogue to manage thoughts, feelings, and urges brought on by  encounters with trauma-related situations. Target Date: 03/04/2024 Frequency: Weekly Progress: 0 Modality: individual Related Interventions 1. Teach the client a guided  self-dialogue procedure in which he/she learns to recognize  maladaptive self-talk, challenges its biases, copes with engendered feelings, overcomes  avoidance, and reinforces his/her accomplishments; review and reinforce progress, problemsolve obstacles. 4. No longer experiences intrusive event recollections, avoidance of event  reminders, intense arousal, or disinterest in activities or  relationships. 5. Thinks about or openly discusses the traumatic event with others  without experiencing psychological or physiological distress. Diagnosis F43.10 (Posttraumatic stress disorder) - Open - [Signifier: n/a] Posttraumatic Stress  Disorder  Conditions For Discharge Achievement of treatment goals and objectives  Patient approves of plan.

## 2023-04-02 ENCOUNTER — Ambulatory Visit (INDEPENDENT_AMBULATORY_CARE_PROVIDER_SITE_OTHER): Payer: Medicare Other | Admitting: Psychology

## 2023-04-02 DIAGNOSIS — F3181 Bipolar II disorder: Secondary | ICD-10-CM | POA: Diagnosis not present

## 2023-04-02 DIAGNOSIS — F4310 Post-traumatic stress disorder, unspecified: Secondary | ICD-10-CM | POA: Diagnosis not present

## 2023-04-02 DIAGNOSIS — F411 Generalized anxiety disorder: Secondary | ICD-10-CM | POA: Diagnosis not present

## 2023-04-03 NOTE — Progress Notes (Signed)
[facility] Counselor/Therapist Progress Note  Patient ID: JESSELYN NAAB, MRN: [id number],    Date: 04/02/2023  Time Spent: 60 minutes  Treatment Type: Individual Therapy  Reported Symptoms: Issues with reacting to her husband.  Mental Status Exam: Appearance:  Casual     Behavior: Appropriate  Motor: Shuffling Gait  Speech/Language:  Clear and Coherent  Affect: Blunt  Mood: sad  Thought process: normal  Thought content:   WNL  Sensory/Perceptual disturbances:   WNL  Orientation: oriented to person, place, time/date, and situation  Attention: Good  Concentration: Good  Memory: WNL  Fund of knowledge:  Good  Insight:   Good  Judgment:  Good  Impulse Control: Good   Risk Assessment: Danger to Self:  No Self-injurious Behavior: No Danger to Others: No Duty to Warn:no Physical Aggression / Violence:No  Access to Firearms a concern: No  Gang Involvement:No   Subjective: The patient attended an individual therapy session in the office today.  The patient presents with a blunted affect and mood is sad.  Today we did EMDR around the patient's relationship with her mother.  The negative cognition was "I do not deserve love".  We installed "I deserve love and can accept the love I get."  The patient seems to have a better understanding of her relationship with her husband and we will continue to work on helping her be less defensive in regard to when he talks with her about various things.  I think during the next session we may look at doing EMDR around her Guy Sandifer as I think part of the issue is how her sister gaslighted her.  Interventions: Cognitive Behavioral Therapy, Eye Movement Desensitization and Reprocessing (EMDR), and Insight-Oriented  Diagnosis:Bipolar II disorder (HCC)  Generalized anxiety disorder  PTSD (post-traumatic stress disorder)  Plan: Treatment Plan Client Abilities/Strengths  Insightful, motivated, supportive husband  Client  Treatment Preferences  Outpatient Individual therapy/EMDR  Client Statement of Needs  "I sometimes react and need help with my PTSD from childhood."  Treatment Level  Outpaeint Individual therapy  Symptoms  Demonstrates an exaggerated startle response.: No Description Entered (Status: improved). Depressed  or irritable mood.:  (Status: maintained). Displays a  significant decline in interest and engagement in activities.: (Status: maintained). Displays significant psychological and/or physiological distress resulting from internal and external  clues that are reminiscent of the traumatic event.: No Description Entered (Status: improved). . Experiences disturbing  and persistent thoughts, images, and/or perceptions of the traumatic event.:  (Status: maintained).  Feelings of hopelessness, worthlessness, or inappropriate guilt.: (Status: maintained). Has been exposed to a traumatic event involving actual or perceived threat of death or  serious injury.: (Status: maintained). Impairment in social, occupational, or  other areas of functioning.: (Status:maintained). Intentionally avoids activities,  places, people, or objects (e.g., up-armored vehicles) that evoke memories of the event.:  (Status: maintained). Intentionally avoids thoughts, feelings, or discussions related  to the traumatic event.: (Status: maintained). Reports difficulty concentrating as  well as feelings of guilt.:  (Status: maintained). Reports response of intense fear,  helplessness, or horror to the traumatic event.: (Status: maintained).  Problems Addressed   Posttraumatic Stress Disorder (PTSD), Posttraumatic Stress Disorder (PTSD),   Posttraumatic Stress Disorder (PTSD), Posttraumatic Stress Disorder (PTSD)  Goals 1. Develop healthy thinking patterns and beliefs about self, others, and the world that lead to the alleviation and help prevent the relapse of  depression. Objective Identify and replace thoughts and  beliefs that support depression. Target Date:03/04/2024 Frequency: Weekly Progress: 0  Modality: individual Related Interventions 1. Explore and restructure underlying assumptions and beliefs reflected in biased self-talk that  may put the client at risk for relapse or recurrence. 2. Conduct Cognitive-Behavioral Therapy (see Cognitive Behavior Therapy by Reola Calkins; Overcoming Depression by Agapito Games al.), beginning with helping the client learn the connection among  cognition, depressive feelings, and actions. 2. Eliminate or reduce the negative impact trauma related symptoms have  on social, occupational, and family functioning. Objective Learn and implement personal skills to manage challenging situations related to trauma. Target Date: 03/04/2024 Frequency: Weekly Progress: 0 Modality: individual 3. No longer avoids persons, places, activities, and objects that are  reminiscent of the traumatic event. Objective Participate in Eye Movement Desensitization and Reprocessing (EMDR) to reduce emotional distress  related to traumatic thoughts, feelings, and images. Target Date: 03/04/2024 Frequency: Weekly Progress: 0 Modality: individual  Related Interventions 1. Utilize Eye Movement Desensitization and Reprocessing (EMDR) to reduce the client's  emotional reactivity to the traumatic event and reduce PTSD symptoms. Objective Learn and implement guided self-dialogue to manage thoughts, feelings, and urges brought on by  encounters with trauma-related situations. Target Date: 03/04/2024 Frequency: Weekly Progress: 0 Modality: individual Related Interventions 1. Teach the client a guided self-dialogue procedure in which he/she learns to recognize  maladaptive self-talk, challenges its biases, copes with engendered feelings, overcomes  avoidance, and reinforces his/her accomplishments; review and reinforce progress, problemsolve obstacles. 4. No longer experiences intrusive event  recollections, avoidance of event  reminders, intense arousal, or disinterest in activities or  relationships. 5. Thinks about or openly discusses the traumatic event with others  without experiencing psychological or physiological distress. Diagnosis F43.10 (Posttraumatic stress disorder) - Open - [Signifier: n/a] Posttraumatic Stress  Disorder  Conditions For Discharge Achievement of treatment goals and objectives  Patient approves of plan.

## 2023-04-09 ENCOUNTER — Ambulatory Visit (INDEPENDENT_AMBULATORY_CARE_PROVIDER_SITE_OTHER): Payer: Medicare Other | Admitting: Psychology

## 2023-04-09 DIAGNOSIS — F4310 Post-traumatic stress disorder, unspecified: Secondary | ICD-10-CM | POA: Diagnosis not present

## 2023-04-09 DIAGNOSIS — Z86010 Personal history of colonic polyps: Secondary | ICD-10-CM | POA: Diagnosis not present

## 2023-04-09 DIAGNOSIS — F411 Generalized anxiety disorder: Secondary | ICD-10-CM | POA: Diagnosis not present

## 2023-04-09 DIAGNOSIS — F3181 Bipolar II disorder: Secondary | ICD-10-CM | POA: Diagnosis not present

## 2023-04-09 DIAGNOSIS — Z8719 Personal history of other diseases of the digestive system: Secondary | ICD-10-CM | POA: Diagnosis not present

## 2023-04-09 DIAGNOSIS — K52832 Lymphocytic colitis: Secondary | ICD-10-CM | POA: Diagnosis not present

## 2023-04-09 DIAGNOSIS — K920 Hematemesis: Secondary | ICD-10-CM | POA: Diagnosis not present

## 2023-04-10 NOTE — Progress Notes (Signed)
[facility] Counselor/Therapist Progress Note  Patient ID: [name] [name], MRN: [id number],    Date: 04/09/2023  Time Spent: 60 minutes  Treatment Type: Individual Therapy  Reported Symptoms: Issues with reacting to her husband.  Mental Status Exam: Appearance:  Casual     Behavior: Appropriate  Motor: Shuffling Gait  Speech/Language:  Clear and Coherent  Affect: Blunt  Mood: sad  Thought process: normal  Thought content:   WNL  Sensory/Perceptual disturbances:   WNL  Orientation: oriented to person, place, time/date, and situation  Attention: Good  Concentration: Good  Memory: WNL  Fund of knowledge:  Good  Insight:   Good  Judgment:  Good  Impulse Control: Good   Risk Assessment: Danger to Self:  No Self-injurious Behavior: No Danger to Others: No Duty to Warn:no Physical Aggression / Violence:No  Access to Firearms a concern: No  Gang Involvement:No   Subjective: The patient attended an individual therapy session in the office today.  The patient presents with a blunted affect and mood is sad.  Today the patient came in and reports that she is having trouble staying awake.  She reports that she will do something and then she will feel like she needs to lay down and sleep for a while.  Today we did some problem solving to see what this is about.  I do not think it is related to the treatment we have been doing with EMDR.  I recommended that if she continues to feel tired like this she may want to go to her primary care doctor and have some blood work done and have a conversation with them as this is different than it was the last time I saw her.  I also am concerned that she is deconditioned and she is not doing any other movement or exercise.  In addition we talked about the possibility that she chooses to be cycling through a depressive cycle.  We will continue to monitor the situation.  I encouraged her to see if she could find something to do socially  that would help her feel more purposeful and have to stay up more.  We did not do EMDR today as I thought that it would be too emotionally draining for her.  Interventions: Cognitive Behavioral Therapy, Eye Movement Desensitization and Reprocessing (EMDR), and Insight-Oriented  Diagnosis:Bipolar II disorder (HCC)  Generalized anxiety disorder  PTSD (post-traumatic stress disorder)  Plan: Treatment Plan Client Abilities/Strengths  Insightful, motivated, supportive husband  Client Treatment Preferences  Outpatient Individual therapy/EMDR  Client Statement of Needs  "I sometimes react and need help with my PTSD from childhood."  Treatment Level  Outpaeint Individual therapy  Symptoms  Demonstrates an exaggerated startle response.: No Description Entered (Status: improved). Depressed  or irritable mood.:  (Status: maintained). Displays a  significant decline in interest and engagement in activities.: (Status: maintained). Displays significant psychological and/or physiological distress resulting from internal and external  clues that are reminiscent of the traumatic event.: No Description Entered (Status: improved). . Experiences disturbing  and persistent thoughts, images, and/or perceptions of the traumatic event.:  (Status: maintained).  Feelings of hopelessness, worthlessness, or inappropriate guilt.: (Status: maintained). Has been exposed to a traumatic event involving actual or perceived threat of death or  serious injury.: (Status: maintained). Impairment in social, occupational, or  other areas of functioning.: (Status:maintained). Intentionally avoids activities,  places, people, or objects (e.g., up-armored vehicles) that evoke memories of the event.:  (Status: maintained). Intentionally avoids thoughts, feelings,  or discussions related  to the traumatic event.: (Status: maintained). Reports difficulty concentrating as  well as feelings of guilt.:  (Status: maintained). Reports  response of intense fear,  helplessness, or horror to the traumatic event.: (Status: maintained).  Problems Addressed   Posttraumatic Stress Disorder (PTSD), Posttraumatic Stress Disorder (PTSD),   Posttraumatic Stress Disorder (PTSD), Posttraumatic Stress Disorder (PTSD)  Goals 1. Develop healthy thinking patterns and beliefs about self, others, and the world that lead to the alleviation and help prevent the relapse of  depression. Objective Identify and replace thoughts and beliefs that support depression. Target Date:03/04/2024 Frequency: Weekly Progress: 10 Modality: individual Related Interventions 1. Explore and restructure underlying assumptions and beliefs reflected in biased self-talk that  may put the client at risk for relapse or recurrence. 2. Conduct Cognitive-Behavioral Therapy (see Cognitive Behavior Therapy by Reola Calkins; Overcoming Depression by Agapito Games al.), beginning with helping the client learn the connection among  cognition, depressive feelings, and actions. 2. Eliminate or reduce the negative impact trauma related symptoms have  on social, occupational, and family functioning. Objective Learn and implement personal skills to manage challenging situations related to trauma. Target Date: 03/04/2024 Frequency: Weekly Progress: 0 Modality: individual 3. No longer avoids persons, places, activities, and objects that are  reminiscent of the traumatic event. Objective Participate in Eye Movement Desensitization and Reprocessing (EMDR) to reduce emotional distress  related to traumatic thoughts, feelings, and images. Target Date: 03/04/2024 Frequency: Weekly Progress: 10 Modality: individual  Related Interventions 1. Utilize Eye Movement Desensitization and Reprocessing (EMDR) to reduce the client's  emotional reactivity to the traumatic event and reduce PTSD symptoms. Objective Learn and implement guided self-dialogue to manage thoughts, feelings, and urges brought  on by  encounters with trauma-related situations. Target Date: 03/04/2024 Frequency: Weekly Progress: 0 Modality: individual Related Interventions 1. Teach the client a guided self-dialogue procedure in which he/she learns to recognize  maladaptive self-talk, challenges its biases, copes with engendered feelings, overcomes  avoidance, and reinforces his/her accomplishments; review and reinforce progress, problemsolve obstacles. 4. No longer experiences intrusive event recollections, avoidance of event  reminders, intense arousal, or disinterest in activities or  relationships. 5. Thinks about or openly discusses the traumatic event with others  without experiencing psychological or physiological distress. Diagnosis F43.10 (Posttraumatic stress disorder) - Open - [Signifier: n/a] Posttraumatic Stress  Disorder  Conditions For Discharge Achievement of treatment goals and objectives  Patient approves of plan.

## 2023-04-16 ENCOUNTER — Ambulatory Visit: Payer: Medicare Other | Admitting: Psychology

## 2023-04-18 NOTE — Progress Notes (Signed)
[facility] Summary Report / Loop Recorder 

## 2023-04-21 ENCOUNTER — Ambulatory Visit (INDEPENDENT_AMBULATORY_CARE_PROVIDER_SITE_OTHER): Payer: Medicare Other

## 2023-04-21 DIAGNOSIS — R55 Syncope and collapse: Secondary | ICD-10-CM | POA: Diagnosis not present

## 2023-04-21 LAB — CUP PACEART REMOTE DEVICE CHECK
Date Time Interrogation Session: 20240602230111
Implantable Pulse Generator Implant Date: 20220411

## 2023-04-24 ENCOUNTER — Ambulatory Visit (INDEPENDENT_AMBULATORY_CARE_PROVIDER_SITE_OTHER): Payer: Medicare Other | Admitting: Psychology

## 2023-04-24 DIAGNOSIS — F4310 Post-traumatic stress disorder, unspecified: Secondary | ICD-10-CM | POA: Diagnosis not present

## 2023-04-24 DIAGNOSIS — F411 Generalized anxiety disorder: Secondary | ICD-10-CM | POA: Diagnosis not present

## 2023-04-24 DIAGNOSIS — F3181 Bipolar II disorder: Secondary | ICD-10-CM | POA: Diagnosis not present
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
## 2023-04-29 ENCOUNTER — Ambulatory Visit (INDEPENDENT_AMBULATORY_CARE_PROVIDER_SITE_OTHER): Payer: Medicare Other | Admitting: Psychology

## 2023-04-29 DIAGNOSIS — F4310 Post-traumatic stress disorder, unspecified: Secondary | ICD-10-CM

## 2023-04-29 DIAGNOSIS — F3181 Bipolar II disorder: Secondary | ICD-10-CM | POA: Diagnosis not present

## 2023-04-29 DIAGNOSIS — F411 Generalized anxiety disorder: Secondary | ICD-10-CM
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
## 2023-05-07 ENCOUNTER — Ambulatory Visit (INDEPENDENT_AMBULATORY_CARE_PROVIDER_SITE_OTHER): Payer: Medicare Other | Admitting: Psychology

## 2023-05-07 DIAGNOSIS — F3181 Bipolar II disorder: Secondary | ICD-10-CM

## 2023-05-07 DIAGNOSIS — F411 Generalized anxiety disorder: Secondary | ICD-10-CM | POA: Diagnosis not present

## 2023-05-07 DIAGNOSIS — F4310 Post-traumatic stress disorder, unspecified: Secondary | ICD-10-CM | POA: Diagnosis not present

## 2023-05-07 NOTE — Progress Notes (Addendum)
[facility] Counselor/Therapist Progress Note  Patient ID: BRITINI GARCILAZO, MRN: [id number],    Date: 05/07/2023  Time Spent: 60 minutes  Time in: 11:00  Time out:12:00  Treatment Type: Individual Therapy  Reported Symptoms: Issues with reacting to her husband.  Mental Status Exam: Appearance:  Casual     Behavior: Appropriate  Motor: Shuffling Gait  Speech/Language:  Clear and Coherent  Affect: Blunt  Mood: pleasant  Thought process: normal  Thought content:   WNL  Sensory/Perceptual disturbances:   WNL  Orientation: oriented to person, place, time/date, and situation  Attention: Good  Concentration: Good  Memory: WNL  Fund of knowledge:  Good  Insight:   Good  Judgment:  Good  Impulse Control: Good   Risk Assessment: Danger to Self:  No Self-injurious Behavior: No Danger to Others: No Duty to Warn:no Physical Aggression / Violence:No  Access to Firearms a concern: No  Gang Involvement:No   Subjective: The patient attended an individual therapy session in the office today.  The patient presents with a blunted affect and mood is pleasant.  The patient reports that things have been going okay this week.  We talked today about furthering our exploration into what negative cognition and what positive cognition we want to target with her developmental trauma.  We had a discussion about how she would want to feel differently and it seems that she has been need to express her opinion about things and we talked about that being not okay sometimes with people that are not able to hear what she has to say and respond to it in a positive way.  We talked about her sister and her son being people that could not do this.  We also talked about her husband and his capabilities in regard to emotional intelligence and that his intent is to normally make sure that she is okay because he cares about her.  We also talked about the intent of [name] and Jola Babinski and that their intent  was to get what they wanted and that it was not about her best interest.  We are getting to a place where we we will be able to do the EMDR soon because I think we will have the correct cognitions to target.  The patient said that this session was very helpful and we will move more towards doing the EMDR around the negative cognition of "I cannot protect myself" and we may use the positive cognition of "I can breathe, pause, and express myself".  We may need to have some more conversation about what expressing herself means during the next session as I am not sure that she understands that sometimes she can choose to back out of a conversation and that is a way of expressing herself. Interventions: Cognitive Behavioral Therapy, Eye Movement Desensitization and Reprocessing (EMDR), and Insight-Oriented  Diagnosis:Bipolar II disorder (HCC)  PTSD (post-traumatic stress disorder)  Generalized anxiety disorder  Plan: Treatment Plan Client Abilities/Strengths  Insightful, motivated, supportive husband  Client Treatment Preferences  Outpatient Individual therapy/EMDR  Client Statement of Needs  "I sometimes react and need help with my PTSD from childhood."  Treatment Level  Outpaeint Individual therapy  Symptoms  Demonstrates an exaggerated startle response.: No Description Entered (Status: improved). Depressed  or irritable mood.:  (Status: maintained). Displays a  significant decline in interest and engagement in activities.: (Status: maintained). Displays significant psychological and/or physiological distress resulting from internal and external  clues that are reminiscent of the traumatic  event.: No Description Entered (Status: improved). . Experiences disturbing  and persistent thoughts, images, and/or perceptions of the traumatic event.:  (Status: maintained).  Feelings of hopelessness, worthlessness, or inappropriate guilt.: (Status: maintained). Has been exposed to a traumatic event  involving actual or perceived threat of death or  serious injury.: (Status: maintained). Impairment in social, occupational, or  other areas of functioning.: (Status:maintained). Intentionally avoids activities,  places, people, or objects (e.g., up-armored vehicles) that evoke memories of the event.:  (Status: maintained). Intentionally avoids thoughts, feelings, or discussions related  to the traumatic event.: (Status: maintained). Reports difficulty concentrating as  well as feelings of guilt.:  (Status: maintained). Reports response of intense fear,  helplessness, or horror to the traumatic event.: (Status: maintained).  Problems Addressed   Posttraumatic Stress Disorder (PTSD), Posttraumatic Stress Disorder (PTSD),   Posttraumatic Stress Disorder (PTSD), Posttraumatic Stress Disorder (PTSD)  Goals 1. Develop healthy thinking patterns and beliefs about self, others, and the world that lead to the alleviation and help prevent the relapse of  depression. Objective Identify and replace thoughts and beliefs that support depression. Target Date:03/04/2024 Frequency: Weekly Progress: 20 Modality: individual Related Interventions 1. Explore and restructure underlying assumptions and beliefs reflected in biased self-talk that  may put the client at risk for relapse or recurrence. 2. Conduct Cognitive-Behavioral Therapy (see Cognitive Behavior Therapy by Reola Calkins; Overcoming Depression by Agapito Games al.), beginning with helping the client learn the connection among  cognition, depressive feelings, and actions. 2. Eliminate or reduce the negative impact trauma related symptoms have  on social, occupational, and family functioning. Objective Learn and implement personal skills to manage challenging situations related to trauma. Target Date: 03/04/2024 Frequency: Weekly Progress: 10 Modality: individual 3. No longer avoids persons, places, activities, and objects that are  reminiscent of the  traumatic event. Objective Participate in Eye Movement Desensitization and Reprocessing (EMDR) to reduce emotional distress  related to traumatic thoughts, feelings, and images. Target Date: 03/04/2024 Frequency: Weekly Progress: 10 Modality: individual  Related Interventions 1. Utilize Eye Movement Desensitization and Reprocessing (EMDR) to reduce the client's  emotional reactivity to the traumatic event and reduce PTSD symptoms. Objective Learn and implement guided self-dialogue to manage thoughts, feelings, and urges brought on by  encounters with trauma-related situations. Target Date: 03/04/2024 Frequency: Weekly Progress: 10 Modality: individual Related Interventions 1. Teach the client a guided self-dialogue procedure in which he/she learns to recognize  maladaptive self-talk, challenges its biases, copes with engendered feelings, overcomes  avoidance, and reinforces his/her accomplishments; review and reinforce progress, problemsolve obstacles. 4. No longer experiences intrusive event recollections, avoidance of event  reminders, intense arousal, or disinterest in activities or  relationships. 5. Thinks about or openly discusses the traumatic event with others  without experiencing psychological or physiological distress. Diagnosis F43.10 (Posttraumatic stress disorder) - Open - [Signifier: n/a] Posttraumatic Stress  Disorder  Conditions For Discharge Achievement of treatment goals and objectives  Patient approves of plan.

## 2023-05-13 NOTE — Progress Notes (Signed)
[facility] Summary Report / Loop Recorder 

## 2023-05-14 ENCOUNTER — Ambulatory Visit: Payer: Medicare Other | Admitting: Psychology

## 2023-05-14 DIAGNOSIS — K2270 Barrett's esophagus without dysplasia: Secondary | ICD-10-CM | POA: Diagnosis not present

## 2023-05-14 DIAGNOSIS — K31819 Angiodysplasia of stomach and duodenum without bleeding: Secondary | ICD-10-CM | POA: Diagnosis not present

## 2023-05-14 DIAGNOSIS — K2289 Other specified disease of esophagus: Secondary | ICD-10-CM | POA: Diagnosis not present

## 2023-05-14 DIAGNOSIS — Q399 Congenital malformation of esophagus, unspecified: Secondary | ICD-10-CM | POA: Diagnosis not present

## 2023-05-14 DIAGNOSIS — K2930 Chronic superficial gastritis without bleeding: Secondary | ICD-10-CM | POA: Diagnosis not present

## 2023-05-14 DIAGNOSIS — K219 Gastro-esophageal reflux disease without esophagitis: Secondary | ICD-10-CM | POA: Diagnosis not present

## 2023-05-14 DIAGNOSIS — K3189 Other diseases of stomach and duodenum: Secondary | ICD-10-CM | POA: Diagnosis not present

## 2023-05-14 DIAGNOSIS — K920 Hematemesis: Secondary | ICD-10-CM | POA: Diagnosis not present

## 2023-05-15 ENCOUNTER — Ambulatory Visit (INDEPENDENT_AMBULATORY_CARE_PROVIDER_SITE_OTHER): Payer: Medicare Other | Admitting: Psychology

## 2023-05-15 DIAGNOSIS — F4310 Post-traumatic stress disorder, unspecified: Secondary | ICD-10-CM | POA: Diagnosis not present

## 2023-05-15 DIAGNOSIS — F3181 Bipolar II disorder: Secondary | ICD-10-CM

## 2023-05-15 DIAGNOSIS — F411 Generalized anxiety disorder: Secondary | ICD-10-CM | POA: Diagnosis not present

## 2023-05-16 NOTE — Progress Notes (Signed)
[facility] Counselor/Therapist Progress Note  Patient ID: [name] [name], MRN: [id number],    Date: 05/15/2023  Time Spent: 60 minutes  Time in: 1:00  Time out:2:00  Treatment Type: Individual Therapy  Reported Symptoms: Issues with reacting to her husband.  Mental Status Exam: Appearance:  Casual     Behavior: Appropriate  Motor: Shuffling Gait  Speech/Language:  Clear and Coherent  Affect: Blunt  Mood: pleasant  Thought process: normal  Thought content:   WNL  Sensory/Perceptual disturbances:   WNL  Orientation: oriented to person, place, time/date, and situation  Attention: Good  Concentration: Good  Memory: WNL  Fund of knowledge:  Good  Insight:   Good  Judgment:  Good  Impulse Control: Good   Risk Assessment: Danger to Self:  No Self-injurious Behavior: No Danger to Others: No Duty to Warn:no Physical Aggression / Violence:No  Access to Firearms a concern: No  Gang Involvement:No   Subjective: The patient attended an individual therapy session in the office today.  The patient presents with a blunted affect and mood is pleasant.  The patient reports that she has been thinking about what we have been talking about as far as the negative cognition and it seems that her negative cognition is "I doubt myself."  We identified the positive cognition as "I can trust myself".  We went ahead and did the EMDR and when we started the EMDR session her suds scale was an 8 or 9.  Her validity of cognitions scale for her positive cognition was a 7.  By the time we finished our session the patient's suds had decreased to a 3 or 4.  The patient reports that she did feel better and we also processed in between sets how to detach some from feeling as if she is being attacked and we discussed the difference between responding and reacting.  Interventions: Cognitive Behavioral Therapy, Eye Movement Desensitization and Reprocessing (EMDR), and  Insight-Oriented  Diagnosis:Bipolar II disorder (HCC)  PTSD (post-traumatic stress disorder)  Generalized anxiety disorder  Plan: Treatment Plan Client Abilities/Strengths  Insightful, motivated, supportive husband  Client Treatment Preferences  Outpatient Individual therapy/EMDR  Client Statement of Needs  "I sometimes react and need help with my PTSD from childhood."  Treatment Level  Outpaeint Individual therapy  Symptoms  Demonstrates an exaggerated startle response.: No Description Entered (Status: improved). Depressed  or irritable mood.:  (Status: maintained). Displays a  significant decline in interest and engagement in activities.: (Status: maintained). Displays significant psychological and/or physiological distress resulting from internal and external  clues that are reminiscent of the traumatic event.: No Description Entered (Status: improved). . Experiences disturbing  and persistent thoughts, images, and/or perceptions of the traumatic event.:  (Status: maintained).  Feelings of hopelessness, worthlessness, or inappropriate guilt.: (Status: maintained). Has been exposed to a traumatic event involving actual or perceived threat of death or  serious injury.: (Status: maintained). Impairment in social, occupational, or  other areas of functioning.: (Status:maintained). Intentionally avoids activities,  places, people, or objects (e.g., up-armored vehicles) that evoke memories of the event.:  (Status: maintained). Intentionally avoids thoughts, feelings, or discussions related  to the traumatic event.: (Status: maintained). Reports difficulty concentrating as  well as feelings of guilt.:  (Status: maintained). Reports response of intense fear,  helplessness, or horror to the traumatic event.: (Status: maintained).  Problems Addressed   Posttraumatic Stress Disorder (PTSD), Posttraumatic Stress Disorder (PTSD),   Posttraumatic Stress Disorder (PTSD), Posttraumatic Stress  Disorder (PTSD)  Goals 1.  Develop healthy thinking patterns and beliefs about self, others, and the world that lead to the alleviation and help prevent the relapse of  depression. Objective Identify and replace thoughts and beliefs that support depression. Target Date:03/04/2024 Frequency: Weekly Progress: 30 Modality: individual Related Interventions 1. Explore and restructure underlying assumptions and beliefs reflected in biased self-talk that  may put the client at risk for relapse or recurrence. 2. Conduct Cognitive-Behavioral Therapy (see Cognitive Behavior Therapy by Reola Calkins; Overcoming Depression by Agapito Games al.), beginning with helping the client learn the connection among  cognition, depressive feelings, and actions. 2. Eliminate or reduce the negative impact trauma related symptoms have  on social, occupational, and family functioning. Objective Learn and implement personal skills to manage challenging situations related to trauma. Target Date: 03/04/2024 Frequency: Weekly Progress: 20 Modality: individual 3. No longer avoids persons, places, activities, and objects that are  reminiscent of the traumatic event. Objective Participate in Eye Movement Desensitization and Reprocessing (EMDR) to reduce emotional distress  related to traumatic thoughts, feelings, and images. Target Date: 03/04/2024 Frequency: Weekly Progress: 20 Modality: individual  Related Interventions 1. Utilize Eye Movement Desensitization and Reprocessing (EMDR) to reduce the client's  emotional reactivity to the traumatic event and reduce PTSD symptoms. Objective Learn and implement guided self-dialogue to manage thoughts, feelings, and urges brought on by  encounters with trauma-related situations. Target Date: 03/04/2024 Frequency: Weekly Progress: 20 Modality: individual Related Interventions 1. Teach the client a guided self-dialogue procedure in which he/she learns to recognize  maladaptive  self-talk, challenges its biases, copes with engendered feelings, overcomes  avoidance, and reinforces his/her accomplishments; review and reinforce progress, problemsolve obstacles. 4. No longer experiences intrusive event recollections, avoidance of event  reminders, intense arousal, or disinterest in activities or  relationships. 5. Thinks about or openly discusses the traumatic event with others  without experiencing psychological or physiological distress. Diagnosis F43.10 (Posttraumatic stress disorder) - Open - [Signifier: n/a] Posttraumatic Stress  Disorder  Conditions For Discharge Achievement of treatment goals and objectives  Patient approves of plan.

## 2023-05-21 ENCOUNTER — Ambulatory Visit: Payer: Medicare Other | Admitting: Psychology

## 2023-05-21 DIAGNOSIS — K2930 Chronic superficial gastritis without bleeding: Secondary | ICD-10-CM | POA: Diagnosis not present

## 2023-05-21 DIAGNOSIS — K219 Gastro-esophageal reflux disease without esophagitis: Secondary | ICD-10-CM | POA: Diagnosis not present

## 2023-05-26 ENCOUNTER — Ambulatory Visit (INDEPENDENT_AMBULATORY_CARE_PROVIDER_SITE_OTHER): Payer: Medicare Other

## 2023-05-26 DIAGNOSIS — R55 Syncope and collapse: Secondary | ICD-10-CM

## 2023-05-26 LAB — CUP PACEART REMOTE DEVICE CHECK
Date Time Interrogation Session: 20240705230012
Implantable Pulse Generator Implant Date: 20220411

## 2023-06-05 ENCOUNTER — Ambulatory Visit (INDEPENDENT_AMBULATORY_CARE_PROVIDER_SITE_OTHER): Payer: Medicare Other | Admitting: Psychology

## 2023-06-05 DIAGNOSIS — F4310 Post-traumatic stress disorder, unspecified: Secondary | ICD-10-CM | POA: Diagnosis not present

## 2023-06-05 DIAGNOSIS — F3181 Bipolar II disorder: Secondary | ICD-10-CM

## 2023-06-05 DIAGNOSIS — F411 Generalized anxiety disorder: Secondary | ICD-10-CM | POA: Diagnosis not present

## 2023-06-05 NOTE — Progress Notes (Signed)
[facility] Counselor/Therapist Progress Note  Patient ID: BENTLI LLORENTE, MRN: [id number],    Date: 06/05/2023  Time Spent: 60 minutes  Time in: 1:00  Time out:2:00  Treatment Type: Individual Therapy  Reported Symptoms: Issues with reacting to her husband.  Mental Status Exam: Appearance:  Casual     Behavior: Appropriate  Motor: Shuffling Gait  Speech/Language:  Clear and Coherent  Affect: Blunt  Mood: pleasant  Thought process: normal  Thought content:   WNL  Sensory/Perceptual disturbances:   WNL  Orientation: oriented to person, place, time/date, and situation  Attention: Good  Concentration: Good  Memory: WNL  Fund of knowledge:  Good  Insight:   Good  Judgment:  Good  Impulse Control: Good   Risk Assessment: Danger to Self:  No Self-injurious Behavior: No Danger to Others: No Duty to Warn:no Physical Aggression / Violence:No  Access to Firearms a concern: No  Gang Involvement:No   Subjective: The patient attended an individual therapy session in the office today.  The patient presents with a blunted affect and mood is pleasant.  The patient reports that she had a few problems with reacting to her husband over the last 2 weeks.  We talked about what might be going on and it seems that she may need to have a little more EMDR and she reports that she feels like she needs to process a bit more about her sister.  We discussed what is going on with her and she seems to be sleeping a great deal.  I am concerned that there is something going on with her neurologically because this has been happening for quite some time.  She is already on some protocol to get her heart checked every month so I am not sure that that is what it is.  She has an appointment with a neurologist at the end of August.  We talked about and I educated her about her husband and how he looks at things and the difficulty that she has with having the expectation that he will respond from an  emotional standpoint.  I asked her if she wanted him to come into the session today and she said she did not.  I told her that we may want to resume those sessions with him to just help him understand a little more where she is coming from and her to understand more where he is coming from.  The patient did report that she feels like we need to work a bit more around the relationship that she had with her sister and we will do some more EMDR around that. Interventions: Cognitive Behavioral Therapy, Eye Movement Desensitization and Reprocessing (EMDR), and Insight-Oriented  Diagnosis:Bipolar II disorder (HCC)  PTSD (post-traumatic stress disorder)  Generalized anxiety disorder  Plan: Treatment Plan Client Abilities/Strengths  Insightful, motivated, supportive husband  Client Treatment Preferences  Outpatient Individual therapy/EMDR  Client Statement of Needs  "I sometimes react and need help with my PTSD from childhood."  Treatment Level  Outpaeint Individual therapy  Symptoms  Demonstrates an exaggerated startle response.: No Description Entered (Status: improved). Depressed  or irritable mood.:  (Status: maintained). Displays a  significant decline in interest and engagement in activities.: (Status: maintained). Displays significant psychological and/or physiological distress resulting from internal and external  clues that are reminiscent of the traumatic event.: No Description Entered (Status: improved). . Experiences disturbing  and persistent thoughts, images, and/or perceptions of the traumatic event.:  (Status: maintained).  Feelings of hopelessness, worthlessness,  or inappropriate guilt.: (Status: maintained). Has been exposed to a traumatic event involving actual or perceived threat of death or  serious injury.: (Status: maintained). Impairment in social, occupational, or  other areas of functioning.: (Status:maintained). Intentionally avoids activities,  places, people, or  objects (e.g., up-armored vehicles) that evoke memories of the event.:  (Status: maintained). Intentionally avoids thoughts, feelings, or discussions related  to the traumatic event.: (Status: maintained). Reports difficulty concentrating as  well as feelings of guilt.:  (Status: maintained). Reports response of intense fear,  helplessness, or horror to the traumatic event.: (Status: maintained).  Problems Addressed   Posttraumatic Stress Disorder (PTSD), Posttraumatic Stress Disorder (PTSD),   Posttraumatic Stress Disorder (PTSD), Posttraumatic Stress Disorder (PTSD)  Goals 1. Develop healthy thinking patterns and beliefs about self, others, and the world that lead to the alleviation and help prevent the relapse of  depression. Objective Identify and replace thoughts and beliefs that support depression. Target Date:03/04/2024 Frequency: Weekly Progress: 30 Modality: individual Related Interventions 1. Explore and restructure underlying assumptions and beliefs reflected in biased self-talk that  may put the client at risk for relapse or recurrence. 2. Conduct Cognitive-Behavioral Therapy (see Cognitive Behavior Therapy by Reola Calkins; Overcoming Depression by Agapito Games al.), beginning with helping the client learn the connection among  cognition, depressive feelings, and actions. 2. Eliminate or reduce the negative impact trauma related symptoms have  on social, occupational, and family functioning. Objective Learn and implement personal skills to manage challenging situations related to trauma. Target Date: 03/04/2024 Frequency: Weekly Progress: 20 Modality: individual 3. No longer avoids persons, places, activities, and objects that are  reminiscent of the traumatic event. Objective Participate in Eye Movement Desensitization and Reprocessing (EMDR) to reduce emotional distress  related to traumatic thoughts, feelings, and images. Target Date: 03/04/2024 Frequency: Weekly Progress: 20  Modality: individual  Related Interventions 1. Utilize Eye Movement Desensitization and Reprocessing (EMDR) to reduce the client's  emotional reactivity to the traumatic event and reduce PTSD symptoms. Objective Learn and implement guided self-dialogue to manage thoughts, feelings, and urges brought on by  encounters with trauma-related situations. Target Date: 03/04/2024 Frequency: Weekly Progress: 20 Modality: individual Related Interventions 1. Teach the client a guided self-dialogue procedure in which he/she learns to recognize  maladaptive self-talk, challenges its biases, copes with engendered feelings, overcomes  avoidance, and reinforces his/her accomplishments; review and reinforce progress, problemsolve obstacles. 4. No longer experiences intrusive event recollections, avoidance of event  reminders, intense arousal, or disinterest in activities or  relationships. 5. Thinks about or openly discusses the traumatic event with others  without experiencing psychological or physiological distress. Diagnosis F43.10 (Posttraumatic stress disorder) - Open - [Signifier: n/a] Posttraumatic Stress  Disorder  Conditions For Discharge Achievement of treatment goals and objectives  Patient approves of plan.

## 2023-06-12 NOTE — Progress Notes (Signed)
[facility] Summary Report / Loop Recorder 

## 2023-06-13 ENCOUNTER — Ambulatory Visit (INDEPENDENT_AMBULATORY_CARE_PROVIDER_SITE_OTHER): Payer: Medicare Other | Admitting: Psychology

## 2023-06-13 DIAGNOSIS — F4310 Post-traumatic stress disorder, unspecified: Secondary | ICD-10-CM | POA: Diagnosis not present

## 2023-06-13 DIAGNOSIS — F3181 Bipolar II disorder: Secondary | ICD-10-CM

## 2023-06-15 NOTE — Progress Notes (Signed)
[facility] Counselor/Therapist Progress Note  Patient ID: [name] [name], MRN: [id number],    Date: 06/13/2023  Time Spent: 60 minutes  Time in: 1:00  Time out:2:00  Treatment Type: Individual Therapy  Reported Symptoms: Issues with reacting to her husband.  Mental Status Exam: Appearance:  Casual     Behavior: Appropriate  Motor: Shuffling Gait  Speech/Language:  Clear and Coherent  Affect: Blunt  Mood: pleasant  Thought process: normal  Thought content:   WNL  Sensory/Perceptual disturbances:   WNL  Orientation: oriented to person, place, time/date, and situation  Attention: Good  Concentration: Good  Memory: WNL  Fund of knowledge:  Good  Insight:   Good  Judgment:  Good  Impulse Control: Good   Risk Assessment: Danger to Self:  No Self-injurious Behavior: No Danger to Others: No Duty to Warn:no Physical Aggression / Violence:No  Access to Firearms a concern: No  Gang Involvement:No   Subjective: The patient attended an individual therapy session in the office today.  The patient presents with a blunted affect and mood is pleasant.  We talked about the patient wanting to work more on the situation with her son [name].  We talked about him being mentally ill and that he is likely not going to come back home.  The patient said that when he would come home, that he would escalate and she would not feel safe and protected by [name].  We talked about this from a logical perspective.  We talked about it not necessarily being [name]'s job to protect her and that if she did not feel safe, that she probably should have called the police as opposed to expecting [name] to do it.  We talked about how [name] does not perceive things the way that she wants him too.  We talked about her having unrealistic expectations of him from an emotional perspective.  Reframed her thinking during today's session.  Interventions: Cognitive Behavioral Therapy, Eye Movement Desensitization and  Reprocessing (EMDR), and Insight-Oriented  Diagnosis:Bipolar II disorder (HCC)  PTSD (post-traumatic stress disorder)  Plan: Treatment Plan Client Abilities/Strengths  Insightful, motivated, supportive husband  Client Treatment Preferences  Outpatient Individual therapy/EMDR  Client Statement of Needs  "I sometimes react and need help with my PTSD from childhood."  Treatment Level  Outpaeint Individual therapy  Symptoms  Demonstrates an exaggerated startle response.: No Description Entered (Status: improved). Depressed  or irritable mood.:  (Status: maintained). Displays a  significant decline in interest and engagement in activities.: (Status: maintained). Displays significant psychological and/or physiological distress resulting from internal and external  clues that are reminiscent of the traumatic event.: No Description Entered (Status: improved). . Experiences disturbing  and persistent thoughts, images, and/or perceptions of the traumatic event.:  (Status: maintained).  Feelings of hopelessness, worthlessness, or inappropriate guilt.: (Status: maintained). Has been exposed to a traumatic event involving actual or perceived threat of death or  serious injury.: (Status: maintained). Impairment in social, occupational, or  other areas of functioning.: (Status:maintained). Intentionally avoids activities,  places, people, or objects (e.g., up-armored vehicles) that evoke memories of the event.:  (Status: maintained). Intentionally avoids thoughts, feelings, or discussions related  to the traumatic event.: (Status: maintained). Reports difficulty concentrating as  well as feelings of guilt.:  (Status: maintained). Reports response of intense fear,  helplessness, or horror to the traumatic event.: (Status: maintained).  Problems Addressed   Posttraumatic Stress Disorder (PTSD), Posttraumatic Stress Disorder (PTSD),   Posttraumatic Stress Disorder (PTSD), Posttraumatic Stress  Disorder  (PTSD)  Goals 1. Develop healthy thinking patterns and beliefs about self, others, and the world that lead to the alleviation and help prevent the relapse of  depression. Objective Identify and replace thoughts and beliefs that support depression. Target Date:03/04/2024 Frequency: Weekly Progress: 30 Modality: individual Related Interventions 1. Explore and restructure underlying assumptions and beliefs reflected in biased self-talk that  may put the client at risk for relapse or recurrence. 2. Conduct Cognitive-Behavioral Therapy (see Cognitive Behavior Therapy by Reola Calkins; Overcoming Depression by Agapito Games al.), beginning with helping the client learn the connection among  cognition, depressive feelings, and actions. 2. Eliminate or reduce the negative impact trauma related symptoms have  on social, occupational, and family functioning. Objective Learn and implement personal skills to manage challenging situations related to trauma. Target Date: 03/04/2024 Frequency: Weekly Progress: 20 Modality: individual 3. No longer avoids persons, places, activities, and objects that are  reminiscent of the traumatic event. Objective Participate in Eye Movement Desensitization and Reprocessing (EMDR) to reduce emotional distress  related to traumatic thoughts, feelings, and images. Target Date: 03/04/2024 Frequency: Weekly Progress: 20 Modality: individual  Related Interventions 1. Utilize Eye Movement Desensitization and Reprocessing (EMDR) to reduce the client's  emotional reactivity to the traumatic event and reduce PTSD symptoms. Objective Learn and implement guided self-dialogue to manage thoughts, feelings, and urges brought on by  encounters with trauma-related situations. Target Date: 03/04/2024 Frequency: Weekly Progress: 20 Modality: individual Related Interventions 1. Teach the client a guided self-dialogue procedure in which he/she learns to recognize  maladaptive self-talk,  challenges its biases, copes with engendered feelings, overcomes  avoidance, and reinforces his/her accomplishments; review and reinforce progress, problemsolve obstacles. 4. No longer experiences intrusive event recollections, avoidance of event  reminders, intense arousal, or disinterest in activities or  relationships. 5. Thinks about or openly discusses the traumatic event with others  without experiencing psychological or physiological distress. Diagnosis F43.10 (Posttraumatic stress disorder) - Open - [Signifier: n/a] Posttraumatic Stress  Disorder  Conditions For Discharge Achievement of treatment goals and objectives  Patient approves of plan.

## 2023-06-20 ENCOUNTER — Ambulatory Visit: Payer: Medicare Other | Admitting: Psychology

## 2023-06-20 DIAGNOSIS — F4310 Post-traumatic stress disorder, unspecified: Secondary | ICD-10-CM | POA: Diagnosis not present

## 2023-06-20 DIAGNOSIS — F3181 Bipolar II disorder: Secondary | ICD-10-CM | POA: Diagnosis not present

## 2023-06-23 NOTE — Progress Notes (Signed)
[facility] Counselor/Therapist Progress Note  Patient ID: [name] [name], MRN: [id number],    Date: 06/20/2023  Time Spent: 60 minutes  Time in: 1:00  Time out:2:00  Treatment Type: Individual Therapy  Reported Symptoms: Issues with reacting to her husband.  Mental Status Exam: Appearance:  Casual     Behavior: Appropriate  Motor: Shuffling Gait  Speech/Language:  Clear and Coherent  Affect: Blunt  Mood: pleasant  Thought process: normal  Thought content:   WNL  Sensory/Perceptual disturbances:   WNL  Orientation: oriented to person, place, time/date, and situation  Attention: Good  Concentration: Good  Memory: WNL  Fund of knowledge:  Good  Insight:   Good  Judgment:  Good  Impulse Control: Good   Risk Assessment: Danger to Self:  No Self-injurious Behavior: No Danger to Others: No Duty to Warn:no Physical Aggression / Violence:No  Access to Firearms a concern: No  Gang Involvement:No   Subjective: The patient attended an individual therapy session in the office today.  The patient presents with a blunted affect and mood is pleasant.  Today we spoke more about her relationship with [name] [name] and her reaction to [name] [name] when she felt like he was separating them because she was contributing to [name] [name]'s escalation.  I explained to the patient that I wanted her to do what she needs to be safe and that [name] [name] is not a safe person for them to be around because he is not stable on his medications.  We talked about the incident where [name] [name] separated the two of them and I explained that I thought [name] [name] was trying to protect her for [name] [name] because he was getting more out of control.  We talked about [name] [name] having difficulty knowing how things might feel for her because he does not have as much emotional intelligence as she does.  She seems to be doing much better with him and understands his way of being better than she did.  We talked about meeting one more time and summarizing what is  different since we have done the EMDR.    Interventions: Cognitive Behavioral Therapy, Eye Movement Desensitization and Reprocessing (EMDR), and Insight-Oriented  Diagnosis:Bipolar II disorder (HCC)  PTSD (post-traumatic stress disorder)  Plan: Treatment Plan Client Abilities/Strengths  Insightful, motivated, supportive husband  Client Treatment Preferences  Outpatient Individual therapy/EMDR  Client Statement of Needs  "I sometimes react and need help with my PTSD from childhood."  Treatment Level  Outpaeint Individual therapy  Symptoms  Demonstrates an exaggerated startle response.: No Description Entered (Status: improved). Depressed  or irritable mood.:  (Status: maintained). Displays a  significant decline in interest and engagement in activities.: (Status: maintained). Displays significant psychological and/or physiological distress resulting from internal and external  clues that are reminiscent of the traumatic event.: No Description Entered (Status: improved). . Experiences disturbing  and persistent thoughts, images, and/or perceptions of the traumatic event.:  (Status: maintained).  Feelings of hopelessness, worthlessness, or inappropriate guilt.: (Status: maintained). Has been exposed to a traumatic event involving actual or perceived threat of death or  serious injury.: (Status: maintained). Impairment in social, occupational, or  other areas of functioning.: (Status:maintained). Intentionally avoids activities,  places, people, or objects (e.g., up-armored vehicles) that evoke memories of the event.:  (Status: maintained). Intentionally avoids thoughts, feelings, or discussions related  to the traumatic event.: (Status: maintained). Reports difficulty concentrating as  well as feelings of guilt.:  (Status: maintained). Reports response of intense fear,  helplessness, or horror to  the traumatic event.: (Status: maintained).  Problems Addressed   Posttraumatic Stress  Disorder (PTSD), Posttraumatic Stress Disorder (PTSD),   Posttraumatic Stress Disorder (PTSD), Posttraumatic Stress Disorder (PTSD)  Goals 1. Develop healthy thinking patterns and beliefs about self, others, and the world that lead to the alleviation and help prevent the relapse of  depression. Objective Identify and replace thoughts and beliefs that support depression. Target Date:03/04/2024 Frequency: Weekly Progress: 40 Modality: individual Related Interventions 1. Explore and restructure underlying assumptions and beliefs reflected in biased self-talk that  may put the client at risk for relapse or recurrence. 2. Conduct Cognitive-Behavioral Therapy (see Cognitive Behavior Therapy by Reola Calkins; Overcoming Depression by Agapito Games al.), beginning with helping the client learn the connection among  cognition, depressive feelings, and actions. 2. Eliminate or reduce the negative impact trauma related symptoms have  on social, occupational, and family functioning. Objective Learn and implement personal skills to manage challenging situations related to trauma. Target Date: 03/04/2024 Frequency: Weekly Progress: 30 Modality: individual 3. No longer avoids persons, places, activities, and objects that are  reminiscent of the traumatic event. Objective Participate in Eye Movement Desensitization and Reprocessing (EMDR) to reduce emotional distress  related to traumatic thoughts, feelings, and images. Target Date: 03/04/2024 Frequency: Weekly Progress: 40 Modality: individual  Related Interventions 1. Utilize Eye Movement Desensitization and Reprocessing (EMDR) to reduce the client's  emotional reactivity to the traumatic event and reduce PTSD symptoms. Objective Learn and implement guided self-dialogue to manage thoughts, feelings, and urges brought on by  encounters with trauma-related situations. Target Date: 03/04/2024 Frequency: Weekly Progress: 40 Modality: individual Related  Interventions 1. Teach the client a guided self-dialogue procedure in which he/she learns to recognize  maladaptive self-talk, challenges its biases, copes with engendered feelings, overcomes  avoidance, and reinforces his/her accomplishments; review and reinforce progress, problemsolve obstacles. 4. No longer experiences intrusive event recollections, avoidance of event  reminders, intense arousal, or disinterest in activities or  relationships. 5. Thinks about or openly discusses the traumatic event with others  without experiencing psychological or physiological distress. Diagnosis F43.10 (Posttraumatic stress disorder) - Open - [Signifier: n/a] Posttraumatic Stress  Disorder  Conditions For Discharge Achievement of treatment goals and objectives  Patient approves of plan.

## 2023-06-24 ENCOUNTER — Ambulatory Visit: Payer: Medicare Other | Admitting: Psychology

## 2023-06-30 ENCOUNTER — Ambulatory Visit: Payer: Medicare Other

## 2023-06-30 DIAGNOSIS — R55 Syncope and collapse: Secondary | ICD-10-CM | POA: Diagnosis not present

## 2023-07-11 DIAGNOSIS — E7800 Pure hypercholesterolemia, unspecified: Secondary | ICD-10-CM | POA: Diagnosis not present

## 2023-07-11 DIAGNOSIS — K219 Gastro-esophageal reflux disease without esophagitis: Secondary | ICD-10-CM | POA: Diagnosis not present

## 2023-07-11 DIAGNOSIS — I129 Hypertensive chronic kidney disease with stage 1 through stage 4 chronic kidney disease, or unspecified chronic kidney disease: Secondary | ICD-10-CM | POA: Diagnosis not present

## 2023-07-11 DIAGNOSIS — R296 Repeated falls: Secondary | ICD-10-CM | POA: Diagnosis not present

## 2023-07-11 DIAGNOSIS — K5289 Other specified noninfective gastroenteritis and colitis: Secondary | ICD-10-CM | POA: Diagnosis not present

## 2023-07-11 DIAGNOSIS — N1832 Chronic kidney disease, stage 3b: Secondary | ICD-10-CM | POA: Diagnosis not present

## 2023-07-11 DIAGNOSIS — R29898 Other symptoms and signs involving the musculoskeletal system: Secondary | ICD-10-CM | POA: Diagnosis not present

## 2023-07-11 DIAGNOSIS — R2689 Other abnormalities of gait and mobility: Secondary | ICD-10-CM | POA: Diagnosis not present

## 2023-07-11 DIAGNOSIS — Z0000 Encounter for general adult medical examination without abnormal findings: Secondary | ICD-10-CM | POA: Diagnosis not present

## 2023-07-11 DIAGNOSIS — Z23 Encounter for immunization: Secondary | ICD-10-CM | POA: Diagnosis not present

## 2023-07-11 DIAGNOSIS — R55 Syncope and collapse: Secondary | ICD-10-CM | POA: Diagnosis not present

## 2023-07-11 DIAGNOSIS — F3132 Bipolar disorder, current episode depressed, moderate: Secondary | ICD-10-CM | POA: Diagnosis not present

## 2023-07-15 NOTE — Progress Notes (Signed)
[facility] Summary Report / Loop Recorder 

## 2023-07-30 ENCOUNTER — Other Ambulatory Visit: Payer: Self-pay | Admitting: Adult Health

## 2023-07-30 DIAGNOSIS — F411 Generalized anxiety disorder: Secondary | ICD-10-CM

## 2023-07-31 DIAGNOSIS — F3181 Bipolar II disorder: Secondary | ICD-10-CM | POA: Diagnosis not present

## 2023-08-01 ENCOUNTER — Ambulatory Visit (INDEPENDENT_AMBULATORY_CARE_PROVIDER_SITE_OTHER): Payer: Medicare Other | Admitting: Adult Health

## 2023-08-01 ENCOUNTER — Encounter: Payer: Self-pay | Admitting: Adult Health

## 2023-08-01 DIAGNOSIS — G2401 Drug induced subacute dyskinesia: Secondary | ICD-10-CM | POA: Diagnosis not present

## 2023-08-01 DIAGNOSIS — F3181 Bipolar II disorder: Secondary | ICD-10-CM

## 2023-08-01 DIAGNOSIS — F411 Generalized anxiety disorder: Secondary | ICD-10-CM

## 2023-08-01 DIAGNOSIS — F32A Depression, unspecified: Secondary | ICD-10-CM | POA: Diagnosis not present

## 2023-08-01 DIAGNOSIS — F4310 Post-traumatic stress disorder, unspecified: Secondary | ICD-10-CM | POA: Diagnosis not present

## 2023-08-01 DIAGNOSIS — R5383 Other fatigue: Secondary | ICD-10-CM

## 2023-08-01 DIAGNOSIS — G4700 Insomnia, unspecified: Secondary | ICD-10-CM | POA: Diagnosis not present

## 2023-08-01 MED ORDER — DULOXETINE HCL 30 MG PO CPEP
30.0000 mg | ORAL_CAPSULE | Freq: Every day | ORAL | 2 refills | Status: DC
Start: 2023-08-01 — End: 2023-10-23

## 2023-08-01 NOTE — Progress Notes (Signed)
[name] [name] [id number] 04-Jun-1946 77 y.o.  Subjective:   Patient ID:  [name] [name] is a 77 y.o. (DOB 22-Feb-1946) female.  Chief Complaint: No chief complaint on file.   HPI [name] [name] presents to the office today for follow-up of BPD-2, GAD, insomnia, and TD.  Referred by Baltazar Apo for medication evaluation.  Describes mood today as "ok". Pleasant. Denies tearfulness. Mood symptoms - reports depression - worsening over the past few months. Reports some anxiety. Reports getting irritable at times - "snappy".  Denies panic attacks. Denies worry, rumination, and over thinking. Mood is lower. Stating "I haven't been feeling good for the past few months". Does not feel like her current medications regimen is working for her - willing to consider other options. Varying interest and motivation. Taking medications as prescribed. Working with therapist. Energy levels lower. Active, does not have a regular exercise routine.  Unable to enjoy usual interests and activities. Married. Lives with husband. Has 2 grown children. Spending time with family and friends. Appetite adequate. Weight gain. Sleep is variable - sleeping 6 hours during the day and 8 hours at bedtime.   Focus and concentration "fair". Completing tasks. Managing aspects of household. Retired - Engineer, site.  Denies SI or HI.  Denies AH or VH. Denies self harm. Denies substance use.  Previous medication trials: Abilify, Seroquel, Latuda, Geodon   Flowsheet Row ED to Hosp-Admission (Discharged) from 09/18/2022 in [facility] from 06/22/2022 in [facility] Department at [facility]  C-SSRS RISK CATEGORY No Risk No Risk        Review of Systems:  Review of Systems  Musculoskeletal:  Negative for gait problem.  Neurological:  Negative for tremors.  Psychiatric/Behavioral:         Please refer to HPI    Medications: I have reviewed the patient's current  medications.  Current Outpatient Medications  Medication Sig Dispense Refill   ALPRAZolam (XANAX) 1 MG tablet TAKE 1 TABLET BY MOUTH 2 TIMES A DAY AS NEEDED FOR ANXIETY OR SLEEP 60 tablet 1   amLODipine (NORVASC) 10 MG tablet Take 1 tablet (10 mg total) by mouth at bedtime. 30 tablet 1   atorvastatin (LIPITOR) 20 MG tablet Take 1 tablet (20 mg total) by mouth at bedtime. 30 tablet 1   buPROPion (WELLBUTRIN XL) 150 MG 24 hr tablet Take one tablet in the morning and one tablet at lunch. 60 tablet 5   colestipol (COLESTID) 1 g tablet Take 2 g by mouth 2 (two) times daily.     lamoTRIgine (LAMICTAL) 200 MG tablet Take 1 tablet (200 mg total) by mouth 2 (two) times daily. 60 tablet 5   Multiple Vitamins-Minerals (MULTIVITAMIN WITH MINERALS) tablet Take 1 tablet by mouth daily.     senna-docusate (SENOKOT-S) 8.6-50 MG tablet Take 1 tablet by mouth at bedtime as needed for mild constipation.     sertraline (ZOLOFT) 100 MG tablet Take 1 tablet (100 mg total) by mouth 2 (two) times daily. 60 tablet 5   valbenazine (INGREZZA) 40 MG capsule Take 1 capsule (40 mg total) by mouth at bedtime. 30 capsule 11   No current facility-administered medications for this visit.    Medication Side Effects: None  Allergies:  Allergies  Allergen Reactions   Latuda [Lurasidone] Other (See Comments)    Increased suicidal thoughts    Aripiprazole Other (See Comments)    Tardive Dyskinesia Other reaction(s): tardive dyskinesia   Bacitracin-Polymyxin B     Other  reaction(s): rash   Losartan Potassium     Other reaction(s): decline gfr   Neo-Bacit-Poly-Lidocaine    Quetiapine Other (See Comments)    Tardive Dyskinesia Other reaction(s): tardive dyskinesia    Past Medical History:  Diagnosis Date   Allergic rhinitis    Bipolar disorder (HCC)    Cholelithiasis    seen on CAT 01/04/09   Chronic renal disease, stage III (HCC)    3/12   Complication of anesthesia    versed and fentanyl did not work for  colonoscopy   Diabetic peripheral neuropathy (HCC) 07/18/2020   DM (diabetes mellitus) (HCC)    Edema    Gait abnormality 08/26/2019   HLD (hyperlipidemia)    HTN (hypertension)    Iron deficiency anemia    3/12    Lymphedema    tarda   Neuropathy    Obstructive sleep apnea on CPAP    Osteoarthritis    Renal cyst    2.7 cm seen on CAT 01/04/09   Tardive dyskinesia     Past Medical History, Surgical history, Social history, and Family history were reviewed and updated as appropriate.   Please see review of systems for further details on the patient's review from today.   Objective:   Physical Exam:  There were no vitals taken for this visit.  Physical Exam Constitutional:      General: She is not in acute distress. Musculoskeletal:        General: No deformity.  Neurological:     Mental Status: She is alert and oriented to person, place, and time.     Coordination: Coordination normal.  Psychiatric:        Attention and Perception: Attention and perception normal. She does not perceive auditory or visual hallucinations.        Mood and Affect: Mood normal. Mood is not anxious or depressed. Affect is not labile, blunt, angry or inappropriate.        Speech: Speech normal.        Behavior: Behavior normal.        Thought Content: Thought content normal. Thought content is not paranoid or delusional. Thought content does not include homicidal or suicidal ideation. Thought content does not include homicidal or suicidal plan.        Cognition and Memory: Cognition and memory normal.        Judgment: Judgment normal.     Comments: Insight intact     Lab Review:     Component Value Date/Time   NA 138 09/21/2022 0349   K 4.2 09/21/2022 0349   CL 106 09/21/2022 0349   CO2 22 09/21/2022 0349   GLUCOSE 99 09/21/2022 0349   BUN 19 09/21/2022 0349   CREATININE 1.20 (H) 09/21/2022 0349   CALCIUM 9.5 09/21/2022 0349   PROT 7.1 09/18/2022 1309   PROT 7.0 01/05/2020 1035    ALBUMIN 3.9 09/18/2022 1309   AST 21 09/18/2022 1309   ALT 15 09/18/2022 1309   ALKPHOS 53 09/18/2022 1309   BILITOT 0.6 09/18/2022 1309   GFRNONAA 47 (L) 09/21/2022 0349   GFRAA 47 (L) 12/06/2019 2242       Component Value Date/Time   WBC 7.3 09/21/2022 0349   RBC 3.76 (L) 09/21/2022 0349   HGB 12.9 09/21/2022 0349   HCT 37.8 09/21/2022 0349   PLT 156 09/21/2022 0349   MCV 100.5 (H) 09/21/2022 0349   MCH 34.3 (H) 09/21/2022 0349   MCHC 34.1 09/21/2022 0349   RDW 12.5  09/21/2022 0349   LYMPHSABS 0.7 10/31/2020 1107   MONOABS 0.5 10/31/2020 1107   EOSABS 0.2 10/31/2020 1107   BASOSABS 0.0 10/31/2020 1107    No results found for: "POCLITH", "LITHIUM"   No results found for: "PHENYTOIN", "PHENOBARB", "VALPROATE", "CBMZ"   .res Assessment: Plan:    Plan:  PDMP reviewed  Ingrezza 40mg  daily  Add Cymbalta 30mg  daily  Request Vitamin D from PCP  Zoloft 100mg  - 2 daily Lamictal 200mg  BID Wellbutrin XL 300mg  daily Xanax 1mg  BID for anxiety as needed - typically takes one a day.  RTC 6 months  Therapist - Bambi Cottle - trauma  Patient advised to contact office with any questions, adverse effects, or acute worsening in signs and symptoms.  Discussed potential benefits, risk, and side effects of benzodiazepines to include potential risk of tolerance and dependence, as well as possible drowsiness.  Advised patient not to drive if experiencing drowsiness and to take lowest possible effective dose to minimize risk of dependence and tolerance.   There are no diagnoses linked to this encounter.   Please see After Visit Summary for patient specific instructions.  Future Appointments  Date Time Provider Department Center  08/01/2023  1:20 PM [place], Thereasa Solo, NP [facility] None  08/04/2023  7:10 AM [facility] DEVICE REMOTES [facility]  08/04/2023 11:00 AM Eloy End, PT [facility]  09/08/2023  7:10 AM [facility] DEVICE REMOTES [facility]  09/16/2023  2:00 PM Brinae Woods, Thereasa Solo, NP [facility] None  10/13/2023  7:10 AM [facility] DEVICE REMOTES [facility]    No orders of the defined types were placed in this encounter.   -------------------------------

## 2023-08-04 ENCOUNTER — Other Ambulatory Visit: Payer: Self-pay

## 2023-08-04 ENCOUNTER — Ambulatory Visit: Payer: Medicare Other | Attending: Internal Medicine

## 2023-08-04 ENCOUNTER — Ambulatory Visit (INDEPENDENT_AMBULATORY_CARE_PROVIDER_SITE_OTHER): Payer: Medicare Other

## 2023-08-04 DIAGNOSIS — R55 Syncope and collapse: Secondary | ICD-10-CM | POA: Diagnosis not present

## 2023-08-04 DIAGNOSIS — R2689 Other abnormalities of gait and mobility: Secondary | ICD-10-CM | POA: Insufficient documentation

## 2023-08-04 DIAGNOSIS — R2681 Unsteadiness on feet: Secondary | ICD-10-CM | POA: Insufficient documentation

## 2023-08-04 DIAGNOSIS — E559 Vitamin D deficiency, unspecified: Secondary | ICD-10-CM | POA: Diagnosis not present

## 2023-08-04 DIAGNOSIS — M6281 Muscle weakness (generalized): Secondary | ICD-10-CM | POA: Diagnosis not present

## 2023-08-04 NOTE — Therapy (Signed)
OUTPATIENT PHYSICAL THERAPY LOWER EXTREMITY EVALUATION   Patient Name: [name] [name] MRN: [id number] DOB:12-09-1945, 77 y.o., female Today's Date: 08/04/2023  END OF SESSION:  PT End of Session - 08/04/23 1210     Visit Number 1    Number of Visits 17    Date for PT Re-Evaluation 09/29/23    Authorization Type Medicare    PT Start Time 1055    PT Stop Time 1150    PT Time Calculation (min) 55 min    Activity Tolerance Patient tolerated treatment well    Behavior During Therapy [facility] for tasks assessed/performed             Past Medical History:  Diagnosis Date   Allergic rhinitis    Bipolar disorder (HCC)    Cholelithiasis    seen on CAT 01/04/09   Chronic renal disease, stage III (HCC)    3/12   Complication of anesthesia    versed and fentanyl did not work for colonoscopy   Diabetic peripheral neuropathy (HCC) 07/18/2020   DM (diabetes mellitus) (HCC)    Edema    Gait abnormality 08/26/2019   HLD (hyperlipidemia)    HTN (hypertension)    Iron deficiency anemia    3/12    Lymphedema    tarda   Neuropathy    Obstructive sleep apnea on CPAP    Osteoarthritis    Renal cyst    2.7 cm seen on CAT 01/04/09   Tardive dyskinesia    Past Surgical History:  Procedure Laterality Date   ABDOMINAL HYSTERECTOMY     BALLOON DILATION N/A 04/24/2020   Procedure: BALLOON DILATION;  Surgeon: Kerin Salen, MD;  Location: [facility];  Service: Gastroenterology;  Laterality: N/A;   BIOPSY  04/24/2020   Procedure: BIOPSY;  Surgeon: Kerin Salen, MD;  Location: [facility];  Service: Gastroenterology;;   BOTOX INJECTION N/A 04/24/2020   Procedure: POSSIBLE BOTOX INJECTION;  Surgeon: Kerin Salen, MD;  Location: [facility];  Service: Gastroenterology;  Laterality: N/A;   COLONOSCOPY  2009; 07/09/11   2009:normal (polyp was not adenoma); 2012:    DILATION AND CURETTAGE OF UTERUS     ESOPHAGEAL MANOMETRY N/A 05/31/2020   Procedure: ESOPHAGEAL MANOMETRY (EM);  Surgeon: Kerin Salen, MD;   Location: [facility];  Service: Gastroenterology;  Laterality: N/A;   ESOPHAGOGASTRODUODENOSCOPY  07/09/11   Erosive gastritis   ESOPHAGOGASTRODUODENOSCOPY (EGD) WITH PROPOFOL N/A 04/24/2020   Procedure: ESOPHAGOGASTRODUODENOSCOPY (EGD) WITH PROPOFOL;  Surgeon: Kerin Salen, MD;  Location: [facility];  Service: Gastroenterology;  Laterality: N/A;   FRACTURE SURGERY     rt fx upper arm   GIVENS CAPSULE STUDY  07/25/2011   normal small bowel   REIMPLANT URETER IN BLADDER  1996   TRIGGER FINGER RELEASE Left 02/04/2013   Procedure: RELEASE TRIGGER FINGER/A-1 PULLEY LEFT RING FINGER;  Surgeon: Tami Ribas, MD;  Location: [facility];  Service: Orthopedics;  Laterality: Left;   Patient Active Problem List   Diagnosis Date Noted   Gait instability 09/26/2022   Chronic renal failure, stage 3b (HCC) 09/25/2022   Generalized anxiety disorder 09/25/2022   Hypertension 09/25/2022   Repeated falls 09/25/2022   General weakness 09/19/2022   Bipolar disorder (HCC) 09/19/2022   Stage 3b chronic kidney disease (CKD) (HCC) 09/19/2022   AKI (acute kidney injury) (HCC) 09/19/2022   GAD (generalized anxiety disorder) 09/19/2022   Recurrent falls 09/19/2022   NSVT (nonsustained ventricular tachycardia) (HCC) 02/24/2021   Bifascicular block 02/24/2021   Fall at  home 10/31/2020   Tardive dyskinesia 07/18/2020   Diabetic peripheral neuropathy (HCC) 07/18/2020   Gait abnormality 08/26/2019   Unspecified gastritis and gastroduodenitis without mention of hemorrhage 07/09/2011   Personal history of failed moderate sedation 05/20/2011   ACUTE CYSTITIS 02/23/2008   HYPERLIPIDEMIA 10/30/2007   DIABETES MELLITUS, TYPE II 07/08/2007   Iron deficiency anemia, unspecified 07/08/2007   DEPRESSION 07/08/2007   Essential hypertension 07/08/2007    PCP: Joya Martyr, MD  REFERRING PROVIDER: Thana Ates, MD   REFERRING DIAG: R29.898 (ICD-10-CM) - Other symptoms and signs involving the  musculoskeletal system   THERAPY DIAG:  Unsteadiness on feet - Plan: PT plan of care cert/re-cert  Muscle weakness (generalized) - Plan: PT plan of care cert/re-cert  Other abnormalities of gait and mobility - Plan: PT plan of care cert/re-cert  Rationale for Evaluation and Treatment: Rehabilitation  ONSET DATE: Chronic  SUBJECTIVE:   SUBJECTIVE STATEMENT: Pt presents to PT with reports of chronic LE weakness and gait disturbances with numerous falls over last few months. Had a fall last November with resultant stay in SNF for a few weeks for rehab. Feels like she was doing better until she got Covid at that facility, which set her back a good deal. When she falls she has a lot of difficulty getting back up, having to call EMS to assist most times.   PERTINENT HISTORY: HTN, DM II, Neuropathy, Tardive dyskinesia, CKD, Falls, bilateral TKA  PAIN:  Are you having pain?  Yes: NPRS scale: 0/10 Pain location: N/A Pain description: N/A Aggravating factors: N/A Relieving factors: N/A  PRECAUTIONS: Fall  RED FLAGS: None   WEIGHT BEARING RESTRICTIONS: No  FALLS:  Has patient fallen in last 6 months? Yes. Number of falls - 10; attributes to weakness bouts of dizziness  LIVING ENVIRONMENT: Lives with: lives with their spouse Lives in: House/apartment Stairs: Yes: Internal: 12 steps; bilateral but cannot reach both and External: 6 steps; on right going up and on left going up Has following equipment at home: Dan Humphreys - 2 wheeled, Environmental consultant - 4 wheeled, shower chair, and bed side commode  OCCUPATION: Retired  PLOF: Independent with household mobility with device  PATIENT GOALS: Pt wants to improve her balance, strength, and walking  OBJECTIVE:   DIAGNOSTIC FINDINGS: N/A  PATIENT SURVEYS:  FOTO: 44% function; 53% predicted   COGNITION: Overall cognitive status: Within functional limits for tasks assessed     SENSATION: Light touch: Impaired - bilateral feet  POSTURE:  rounded shoulders and forward head; large body habitus  PALPATION: No overt TTP noted  LOWER EXTREMITY ROM:  Active ROM Right eval Left eval  Hip flexion [facility]  Hip extension    Hip abduction    Hip adduction    Hip internal rotation    Hip external rotation    Knee flexion [facility] WFL  Knee extension [facility]  Ankle dorsiflexion    Ankle plantarflexion    Ankle inversion    Ankle eversion     (Blank rows = not tested)  LOWER EXTREMITY MMT:  MMT Right eval Left eval  Hip flexion 3+/5 3+/5  Hip extension    Hip abduction 4/5 4/5  Hip adduction 4/5 4/5  Hip internal rotation    Hip external rotation    Knee flexion 4/5 4/5  Knee extension 4/5 4/5  Ankle dorsiflexion    Ankle plantarflexion    Ankle inversion    Ankle eversion     (Blank rows = not tested)  LOWER EXTREMITY SPECIAL TESTS:  DNT  FUNCTIONAL TESTS:  30 Second Sit to Stand: 8 reps with UE TUG: 20 seconds with no AD TUG: 21 seconds with FWW : 152ft with FWW   GAIT: Distance walked: 90ft Assistive device utilized: Walker - 2 wheeled Level of assistance: Modified independence Comments: decreased gait speed   TREATMENT: [facility] Adult PT Treatment:                                                DATE: 08/04/2023 Therapeutic Exercise: SLR x 5 ea Supine clamshell x 10 blue band Bridge x 5  PATIENT EDUCATION:  Education details: eval findings, FOTO, HEP, POC Person educated: Patient Education method: Explanation, Demonstration, and Handouts Education comprehension: verbalized understanding and returned demonstration  HOME EXERCISE PROGRAM: Access Code: [email or web address] URL: [email or web address] Date: 08/04/2023 Prepared by: Edwinna Areola  Exercises - Active Straight Leg Raise with Quad Set  - 2 x daily - 7 x weekly - 3 sets - 10 reps - Hooklying Clamshell with Resistance  - 2 x daily - 7 x weekly - 3 sets - 10 reps - blue band hold - Supine Bridge  - 2 x daily - 7 x weekly - 3 sets  - 10 reps  ASSESSMENT:  CLINICAL IMPRESSION: Patient is a 77 y.o. F who was seen today for physical therapy evaluation and treatment for LE weakness and imbalance. Physical findings are consistent with MD impression as pt demonstrates decrease in strength and functional mobility secondary that places her at increased risk for falls. FOTO score demonstrates decrease in subjective functional ability below PLOF. Pt would benefit from skilled PT services working on improving strength, gait, and functional mobility.    OBJECTIVE IMPAIRMENTS: Abnormal gait, decreased activity tolerance, decreased balance, decreased endurance, decreased mobility, difficulty walking, decreased ROM, decreased strength, and impaired sensation  ACTIVITY LIMITATIONS: carrying, lifting, sitting, standing, squatting, stairs, transfers, bed mobility, and locomotion level  PARTICIPATION LIMITATIONS: meal prep, cleaning, driving, shopping, community activity, occupation, and yard work  PERSONAL FACTORS: Fitness, Time since onset of injury/illness/exacerbation, and 3+ comorbidities: HTN, DM II, Neuropathy, Tardive dyskinesia, CKD, Falls, bilateral TKA  are also affecting patient's functional outcome.   REHAB POTENTIAL: Good  CLINICAL DECISION MAKING: Evolving/moderate complexity  EVALUATION COMPLEXITY: Moderate   GOALS: Goals reviewed with patient? No  SHORT TERM GOALS: Target date: 08/25/2023   Pt will be compliant and knowledgeable with initial HEP for improved comfort and carryover Baseline: initial HEP given  Goal status: INITIAL  LONG TERM GOALS: Target date: 09/29/2023   Pt will improve FOTO function score to no less than 53% as proxy for functional improvement with home ADLs and community activity Baseline: 44% function Goal status: INITIAL   2.  Pt will increase distance in to at least 292ft to improve balance and safety with community navigation and increasing towards normative value Baseline: 162ft  with FWW Goal status: INITIAL   3.  Pt will increase 30 Second Sit to Stand rep count to no less than 10 reps for improved balance, strength, and functional mobility Baseline: 8 reps with UE Goal status: INITIAL   4.  Pt will decrease TUG to no less than 13.5 seconds for improved balance and safety with home and community navigation Baseline:  TUG: 20 seconds with no AD TUG: 21 seconds with FWW Goal status: INITIAL  PLAN:  PT FREQUENCY: 2x/week  PT DURATION: 8 weeks  PLANNED INTERVENTIONS: Therapeutic exercises, Therapeutic activity, Neuromuscular re-education, Balance training, Gait training, Patient/Family education, Self Care, Joint mobilization, Aquatic Therapy, Electrical stimulation, Cryotherapy, Moist heat, Manual therapy, and Re-evaluation  PLAN FOR NEXT SESSION: assess HEP response, gait and balance, LE strengthening   Eloy End, PT 08/04/2023, 2:02 PM

## 2023-08-05 LAB — CUP PACEART REMOTE DEVICE CHECK
Date Time Interrogation Session: 20240913230528
Implantable Pulse Generator Implant Date: 20220411

## 2023-08-13 ENCOUNTER — Ambulatory Visit: Payer: Medicare Other

## 2023-08-16 DIAGNOSIS — M79672 Pain in left foot: Secondary | ICD-10-CM | POA: Diagnosis not present

## 2023-08-16 DIAGNOSIS — W109XXA Fall (on) (from) unspecified stairs and steps, initial encounter: Secondary | ICD-10-CM | POA: Diagnosis not present

## 2023-08-16 DIAGNOSIS — S99192A Other physeal fracture of left metatarsal, initial encounter for closed fracture: Secondary | ICD-10-CM | POA: Diagnosis not present

## 2023-08-18 ENCOUNTER — Ambulatory Visit: Payer: Medicare Other

## 2023-08-18 ENCOUNTER — Telehealth: Payer: Self-pay

## 2023-08-18 NOTE — Telephone Encounter (Signed)
PT called and spoke with patient after she had fallen at home and fractured her left foot. PT advised patient to hold on therapy until she follows up with orthopedics. We left her visit on 09/01/2023 on the schedule, but will cancel and hold this visit if she has not seen orthopedics or has not been cleared before then.  Patient in agreement with this plan and verbalized understand.   Eloy End , PT 08/18/23 10:41 AM

## 2023-08-20 ENCOUNTER — Ambulatory Visit: Payer: Medicare Other | Admitting: Physical Therapy

## 2023-08-20 NOTE — Progress Notes (Signed)
[facility] Summary Report / Loop Recorder 

## 2023-08-21 DIAGNOSIS — F3181 Bipolar II disorder: Secondary | ICD-10-CM | POA: Diagnosis not present

## 2023-08-25 ENCOUNTER — Ambulatory Visit: Payer: Medicare Other

## 2023-08-27 ENCOUNTER — Ambulatory Visit: Payer: Medicare Other

## 2023-08-27 DIAGNOSIS — S92355A Nondisplaced fracture of fifth metatarsal bone, left foot, initial encounter for closed fracture: Secondary | ICD-10-CM | POA: Diagnosis not present

## 2023-08-27 DIAGNOSIS — M8580 Other specified disorders of bone density and structure, unspecified site: Secondary | ICD-10-CM | POA: Diagnosis not present

## 2023-08-29 ENCOUNTER — Ambulatory Visit: Payer: Medicare Other | Admitting: Adult Health

## 2023-08-29 ENCOUNTER — Encounter: Payer: Self-pay | Admitting: Adult Health

## 2023-08-29 ENCOUNTER — Telehealth: Payer: Medicare Other | Admitting: Adult Health

## 2023-08-29 DIAGNOSIS — G2401 Drug induced subacute dyskinesia: Secondary | ICD-10-CM

## 2023-08-29 DIAGNOSIS — F3181 Bipolar II disorder: Secondary | ICD-10-CM | POA: Diagnosis not present

## 2023-08-29 DIAGNOSIS — F411 Generalized anxiety disorder: Secondary | ICD-10-CM

## 2023-08-29 DIAGNOSIS — F4310 Post-traumatic stress disorder, unspecified: Secondary | ICD-10-CM

## 2023-08-29 MED ORDER — ALPRAZOLAM 1 MG PO TABS: ORAL_TABLET | ORAL | 1 refills | Status: DC

## 2023-08-29 MED ORDER — SERTRALINE HCL 100 MG PO TABS
100.0000 mg | ORAL_TABLET | Freq: Two times a day (BID) | ORAL | 5 refills | Status: DC
Start: 2023-08-29 — End: 2024-02-27

## 2023-08-29 MED ORDER — LAMOTRIGINE 200 MG PO TABS: 200.0000 mg | ORAL_TABLET | Freq: Two times a day (BID) | ORAL | 5 refills | Status: AC

## 2023-08-29 MED ORDER — BUPROPION HCL ER (XL) 150 MG PO TB24: ORAL_TABLET | ORAL | 5 refills | Status: AC

## 2023-08-29 NOTE — Progress Notes (Signed)
[name] [name] [id number] June 20, 1946 77 y.o.  Virtual Visit via Video Note  I connected with pt @ on 08/29/23 at  1:20 PM EDT by a video enabled telemedicine application and verified that I am speaking with the correct person using two identifiers.   I discussed the limitations of evaluation and management by telemedicine and the availability of in person appointments. The patient expressed understanding and agreed to proceed.  I discussed the assessment and treatment plan with the patient. The patient was provided an opportunity to ask questions and all were answered. The patient agreed with the plan and demonstrated an understanding of the instructions.   The patient was advised to call back or seek an in-person evaluation if the symptoms worsen or if the condition fails to improve as anticipated.  I provided 25 minutes of non-face-to-face time during this encounter.  The patient was located at home.  The provider was located at [facility].   [name] Gibbs, NP   Subjective:   Patient ID:  [name] [name] is a 77 y.o. (DOB [birth year]) female.  Chief Complaint: No chief complaint on file.   HPI [name] presents for follow-up of BPD-2, GAD, insomnia, and TD.  Referred by Baltazar Apo for medication evaluation.  Describes mood today as "ok". Pleasant. Denies tearfulness. Mood symptoms - denies depression -  anxiety, and irritability. Reports feeling frustrated with current medical issues. Denies panic attacks. Denies worry, rumination, and over thinking. Mood is stable. Stating "I'm frustrated with things right now". Feels like current medications regimen works well. Varying interest and motivation. Taking medications as prescribed. Working with therapist. Energy levels lower. Active, does not have a regular exercise routine.  Unable to enjoy usual interests and activities. Married. Lives with husband. Has 2 grown children. Spending time with family and  friends. Appetite adequate. Weight gain. Sleeping well at night. Averages 8 hours. Reports daytime napping. Focus and concentration stable. Completing tasks. Managing aspects of household. Retired - Engineer, site.  Denies SI or HI.  Denies AH or VH. Denies self harm. Denies substance use.  Previous medication trials: Abilify, Seroquel, Latuda, Geodon   Review of Systems:  Review of Systems  Musculoskeletal:  Negative for gait problem.  Neurological:  Negative for tremors.  Psychiatric/Behavioral:         Please refer to HPI    Medications: I have reviewed the patient's current medications.  Current Outpatient Medications  Medication Sig Dispense Refill   ALPRAZolam (XANAX) 1 MG tablet TAKE 1 TABLET BY MOUTH 2 TIMES A DAY AS NEEDED FOR ANXIETY OR SLEEP 60 tablet 1   amLODipine (NORVASC) 10 MG tablet Take 1 tablet (10 mg total) by mouth at bedtime. 30 tablet 1   atorvastatin (LIPITOR) 20 MG tablet Take 1 tablet (20 mg total) by mouth at bedtime. 30 tablet 1   buPROPion (WELLBUTRIN XL) 150 MG 24 hr tablet Take one tablet in the morning and one tablet at lunch. 60 tablet 5   colestipol (COLESTID) 1 g tablet Take 2 g by mouth 2 (two) times daily.     DULoxetine (CYMBALTA) 30 MG capsule Take 1 capsule (30 mg total) by mouth daily. 30 capsule 2   lamoTRIgine (LAMICTAL) 200 MG tablet Take 1 tablet (200 mg total) by mouth 2 (two) times daily. 60 tablet 5   Multiple Vitamins-Minerals (MULTIVITAMIN WITH MINERALS) tablet Take 1 tablet by mouth daily.     senna-docusate (SENOKOT-S) 8.6-50 MG tablet Take 1 tablet by mouth at bedtime  as needed for mild constipation.     sertraline (ZOLOFT) 100 MG tablet Take 1 tablet (100 mg total) by mouth 2 (two) times daily. 60 tablet 5   valbenazine (INGREZZA) 40 MG capsule Take 1 capsule (40 mg total) by mouth at bedtime. 30 capsule 11   No current facility-administered medications for this visit.    Medication Side Effects: None  Allergies:   Allergies  Allergen Reactions   Latuda [Lurasidone] Other (See Comments)    Increased suicidal thoughts    Aripiprazole Other (See Comments)    Tardive Dyskinesia Other reaction(s): tardive dyskinesia   Bacitracin-Polymyxin B     Other reaction(s): rash   Losartan Potassium     Other reaction(s): decline gfr   Neo-Bacit-Poly-Lidocaine    Quetiapine Other (See Comments)    Tardive Dyskinesia Other reaction(s): tardive dyskinesia    Past Medical History:  Diagnosis Date   Allergic rhinitis    Bipolar disorder (HCC)    Cholelithiasis    seen on CAT 01/04/09   Chronic renal disease, stage III (HCC)    3/12   Complication of anesthesia    versed and fentanyl did not work for colonoscopy   Diabetic peripheral neuropathy (HCC) 07/18/2020   DM (diabetes mellitus) (HCC)    Edema    Gait abnormality 08/26/2019   HLD (hyperlipidemia)    HTN (hypertension)    Iron deficiency anemia    3/12    Lymphedema    tarda   Neuropathy    Obstructive sleep apnea on CPAP    Osteoarthritis    Renal cyst    2.7 cm seen on CAT 01/04/09   Tardive dyskinesia     Family History  Problem Relation Age of Onset   Diabetes Mother    Hyperlipidemia Mother    Heart disease Father    Heart attack Father    Colon cancer Neg Hx     Social History   Socioeconomic History   Marital status: Married    Spouse name: Not on file   Number of children: 2   Years of education: Not on file   Highest education level: Not on file  Occupational History   Occupation: Retired Runner, broadcasting/film/video  Tobacco Use   Smoking status: Former    Current packs/day: 0.00    Types: Cigarettes    Quit date: 02/02/1991    Years since quitting: 32.5   Smokeless tobacco: Never  Substance and Sexual Activity   Alcohol use: Not Currently    Comment: less than 1 a day   Drug use: No   Sexual activity: Not on file  Other Topics Concern   Not on file  Social History Narrative   Right handed    Caffeine ~1-2 cups every other  day    Lives at home with husband    Social Determinants of Health   Financial Resource Strain: Not on file  Food Insecurity: No Food Insecurity (09/19/2022)   Hunger Vital Sign    Worried About Running Out of Food in the Last Year: Never true    Ran Out of Food in the Last Year: Never true  Transportation Needs: No Transportation Needs (09/19/2022)   PRAPARE - Administrator, Civil Service (Medical): No    Lack of Transportation (Non-Medical): No  Physical Activity: Not on file  Stress: Not on file  Social Connections: Not on file  Intimate Partner Violence: Not At Risk (09/19/2022)   Humiliation, Afraid, Rape, and Kick questionnaire  Fear of Current or Ex-Partner: No    Emotionally Abused: No    Physically Abused: No    Sexually Abused: No    Past Medical History, Surgical history, Social history, and Family history were reviewed and updated as appropriate.   Please see review of systems for further details on the patient's review from today.   Objective:   Physical Exam:  There were no vitals taken for this visit.  Physical Exam Constitutional:      General: She is not in acute distress. Musculoskeletal:        General: No deformity.  Neurological:     Mental Status: She is alert and oriented to person, place, and time.     Coordination: Coordination normal.  Psychiatric:        Attention and Perception: Attention and perception normal. She does not perceive auditory or visual hallucinations.        Mood and Affect: Affect is not labile, blunt, angry or inappropriate.        Speech: Speech normal.        Behavior: Behavior normal.        Thought Content: Thought content normal. Thought content is not paranoid or delusional. Thought content does not include homicidal or suicidal ideation. Thought content does not include homicidal or suicidal plan.        Cognition and Memory: Cognition and memory normal.        Judgment: Judgment normal.     Comments:  Insight intact     Lab Review:     Component Value Date/Time   NA 138 09/21/2022 0349   K 4.2 09/21/2022 0349   CL 106 09/21/2022 0349   CO2 22 09/21/2022 0349   GLUCOSE 99 09/21/2022 0349   BUN 19 09/21/2022 0349   CREATININE 1.20 (H) 09/21/2022 0349   CALCIUM 9.5 09/21/2022 0349   PROT 7.1 09/18/2022 1309   PROT 7.0 01/05/2020 1035   ALBUMIN 3.9 09/18/2022 1309   AST 21 09/18/2022 1309   ALT 15 09/18/2022 1309   ALKPHOS 53 09/18/2022 1309   BILITOT 0.6 09/18/2022 1309   GFRNONAA 47 (L) 09/21/2022 0349   GFRAA 47 (L) 12/06/2019 2242       Component Value Date/Time   WBC 7.3 09/21/2022 0349   RBC 3.76 (L) 09/21/2022 0349   HGB 12.9 09/21/2022 0349   HCT 37.8 09/21/2022 0349   PLT 156 09/21/2022 0349   MCV 100.5 (H) 09/21/2022 0349   MCH 34.3 (H) 09/21/2022 0349   MCHC 34.1 09/21/2022 0349   RDW 12.5 09/21/2022 0349   LYMPHSABS 0.7 10/31/2020 1107   MONOABS 0.5 10/31/2020 1107   EOSABS 0.2 10/31/2020 1107   BASOSABS 0.0 10/31/2020 1107    No results found for: "POCLITH", "LITHIUM"   No results found for: "PHENYTOIN", "PHENOBARB", "VALPROATE", "CBMZ"   .res Assessment: Plan:    Plan:  PDMP reviewed  Ingrezza 40mg  daily  Cymbalta 30mg  daily Zoloft 100mg  - 2 daily Lamictal 200mg  BID Wellbutrin XL 300mg  daily Xanax 1mg  BID for anxiety as needed - typically takes one a day.  RTC 6 months  Therapist - Bambi Cottle - trauma  Patient advised to contact office with any questions, adverse effects, or acute worsening in signs and symptoms.  Discussed potential benefits, risk, and side effects of benzodiazepines to include potential risk of tolerance and dependence, as well as possible drowsiness.  Advised patient not to drive if experiencing drowsiness and to take lowest possible effective dose to minimize  risk of dependence and tolerance.  There are no diagnoses linked to this encounter.   Please see After Visit Summary for patient specific  instructions.  Future Appointments  Date Time Provider Department Center  08/29/2023  1:20 PM Fredric Slabach, Thereasa Solo, NP [facility] None  09/01/2023 11:45 AM Ian Bushman [facility]  09/03/2023 11:45 AM Greta Doom [facility]  09/08/2023  7:10 AM [facility] DEVICE REMOTES [facility]  10/13/2023  7:10 AM [facility] DEVICE REMOTES [facility]    No orders of the defined types were placed in this encounter.     -------------------------------

## 2023-09-01 ENCOUNTER — Telehealth: Payer: Self-pay

## 2023-09-01 ENCOUNTER — Ambulatory Visit: Payer: Medicare Other | Attending: Internal Medicine

## 2023-09-01 NOTE — Telephone Encounter (Signed)
PT called and spoke with patient. She has a Jones fracture in her foot and they want her to hold on WB for 2-3 months. She will call back or get a new referral to come back in a few months if her foot heals well.  Eloy End   09/01/23 12:08 PM

## 2023-09-03 ENCOUNTER — Ambulatory Visit: Payer: Medicare Other | Admitting: Physical Therapy

## 2023-09-04 DIAGNOSIS — F3181 Bipolar II disorder: Secondary | ICD-10-CM | POA: Diagnosis not present

## 2023-09-08 ENCOUNTER — Ambulatory Visit: Payer: Medicare Other

## 2023-09-08 DIAGNOSIS — R55 Syncope and collapse: Secondary | ICD-10-CM | POA: Diagnosis not present

## 2023-09-09 LAB — CUP PACEART REMOTE DEVICE CHECK
Date Time Interrogation Session: 20241020230625
Implantable Pulse Generator Implant Date: 20220411

## 2023-09-10 ENCOUNTER — Other Ambulatory Visit: Payer: Self-pay | Admitting: Sports Medicine

## 2023-09-10 DIAGNOSIS — S92355A Nondisplaced fracture of fifth metatarsal bone, left foot, initial encounter for closed fracture: Secondary | ICD-10-CM | POA: Diagnosis not present

## 2023-09-10 DIAGNOSIS — M25511 Pain in right shoulder: Secondary | ICD-10-CM | POA: Diagnosis not present

## 2023-09-10 DIAGNOSIS — M8580 Other specified disorders of bone density and structure, unspecified site: Secondary | ICD-10-CM | POA: Diagnosis not present

## 2023-09-16 ENCOUNTER — Ambulatory Visit: Payer: Medicare Other | Admitting: Adult Health

## 2023-09-18 DIAGNOSIS — F3181 Bipolar II disorder: Secondary | ICD-10-CM | POA: Diagnosis not present

## 2023-09-25 NOTE — Progress Notes (Signed)
[facility] Summary Report / Loop Recorder 

## 2023-10-01 DIAGNOSIS — F3181 Bipolar II disorder: Secondary | ICD-10-CM | POA: Diagnosis not present

## 2023-10-01 DIAGNOSIS — S92355A Nondisplaced fracture of fifth metatarsal bone, left foot, initial encounter for closed fracture: Secondary | ICD-10-CM | POA: Diagnosis not present

## 2023-10-13 ENCOUNTER — Ambulatory Visit (INDEPENDENT_AMBULATORY_CARE_PROVIDER_SITE_OTHER): Payer: Medicare Other

## 2023-10-13 DIAGNOSIS — I4891 Unspecified atrial fibrillation: Secondary | ICD-10-CM

## 2023-10-13 DIAGNOSIS — R55 Syncope and collapse: Secondary | ICD-10-CM

## 2023-10-13 LAB — CUP PACEART REMOTE DEVICE CHECK
Date Time Interrogation Session: 20241122230255
Implantable Pulse Generator Implant Date: 20220411

## 2023-10-20 ENCOUNTER — Telehealth: Payer: Self-pay | Admitting: Adult Health

## 2023-10-20 NOTE — Telephone Encounter (Signed)
Patient has RF at the pharmacy. Notified husband.

## 2023-10-20 NOTE — Telephone Encounter (Signed)
Patient called for refill on Sertraline 100mg . Ph: [phone] Appt 12/5 Pharmacy [facility] [address] [place]

## 2023-10-23 ENCOUNTER — Ambulatory Visit (INDEPENDENT_AMBULATORY_CARE_PROVIDER_SITE_OTHER): Payer: Medicare Other | Admitting: Adult Health

## 2023-10-23 ENCOUNTER — Encounter: Payer: Self-pay | Admitting: Adult Health

## 2023-10-23 DIAGNOSIS — F411 Generalized anxiety disorder: Secondary | ICD-10-CM | POA: Diagnosis not present

## 2023-10-23 DIAGNOSIS — G2401 Drug induced subacute dyskinesia: Secondary | ICD-10-CM

## 2023-10-23 DIAGNOSIS — G4700 Insomnia, unspecified: Secondary | ICD-10-CM

## 2023-10-23 DIAGNOSIS — F3181 Bipolar II disorder: Secondary | ICD-10-CM

## 2023-10-23 MED ORDER — DULOXETINE HCL 30 MG PO CPEP: 30.0000 mg | ORAL_CAPSULE | Freq: Every day | ORAL | 5 refills | Status: AC

## 2023-10-23 MED ORDER — ALPRAZOLAM 1 MG PO TABS
ORAL_TABLET | ORAL | 2 refills | Status: DC
Start: 2023-10-23 — End: 2024-02-27

## 2023-10-23 NOTE — Progress Notes (Signed)
[name] [id number] 09-27-1946 77 y.o.  Subjective:   Patient ID:  [name] [name] is a 77 y.o. (DOB 11-Jul-1946) female.  Chief Complaint: No chief complaint on file.   HPI HADASHA ARTLEY presents to the office today for follow-up of BPD-2, GAD, insomnia, and TD.  Referred by Baltazar Apo for medication evaluation.  Describes mood today as "ok". Pleasant. Denies tearfulness. Mood symptoms - reports situational depression,  anxiety, and irritability. Denies panic attacks. Denies worry, rumination, and over thinking. Mood is stable. Stating "I feel like I'm doing pretty well". Feels like current medications regimen works well. Varying interest and motivation. Taking medications as prescribed. Working with therapist. Energy levels lower. Active, does not have a regular exercise routine.  Unable to enjoy usual interests and activities. Married. Lives with husband. Has 2 grown children. Spending time with family and friends. Appetite adequate. Weight stable. Sleeping well at night. Averages 8 hours. Reports daytime napping. Focus and concentration stable. Completing tasks. Managing aspects of household. Retired - Engineer, site.  Denies SI or HI.  Denies AH or VH. Denies self harm. Denies substance use.  Previous medication trials: Abilify, Seroquel, Latuda, Geodon   Flowsheet Row ED to Hosp-Admission (Discharged) from 09/18/2022 in [facility] from 06/22/2022 in [facility] Emergency Department at [facility]  C-SSRS RISK CATEGORY No Risk No Risk        Review of Systems:  Review of Systems  Musculoskeletal:  Negative for gait problem.  Neurological:  Negative for tremors.  Psychiatric/Behavioral:         Please refer to HPI    Medications: I have reviewed the patient's current medications.  Current Outpatient Medications  Medication Sig Dispense Refill   ALPRAZolam (XANAX) 1 MG tablet TAKE 1 TABLET BY MOUTH 2 TIMES A DAY AS NEEDED FOR  ANXIETY OR SLEEP 60 tablet 1   amLODipine (NORVASC) 10 MG tablet Take 1 tablet (10 mg total) by mouth at bedtime. 30 tablet 1   atorvastatin (LIPITOR) 20 MG tablet Take 1 tablet (20 mg total) by mouth at bedtime. 30 tablet 1   buPROPion (WELLBUTRIN XL) 150 MG 24 hr tablet Take one tablet in the morning and one tablet at lunch. 60 tablet 5   colestipol (COLESTID) 1 g tablet Take 2 g by mouth 2 (two) times daily.     DULoxetine (CYMBALTA) 30 MG capsule Take 1 capsule (30 mg total) by mouth daily. 30 capsule 2   lamoTRIgine (LAMICTAL) 200 MG tablet Take 1 tablet (200 mg total) by mouth 2 (two) times daily. 60 tablet 5   Multiple Vitamins-Minerals (MULTIVITAMIN WITH MINERALS) tablet Take 1 tablet by mouth daily.     senna-docusate (SENOKOT-S) 8.6-50 MG tablet Take 1 tablet by mouth at bedtime as needed for mild constipation.     sertraline (ZOLOFT) 100 MG tablet Take 1 tablet (100 mg total) by mouth 2 (two) times daily. 60 tablet 5   valbenazine (INGREZZA) 40 MG capsule Take 1 capsule (40 mg total) by mouth at bedtime. 30 capsule 11   No current facility-administered medications for this visit.    Medication Side Effects: None  Allergies:  Allergies  Allergen Reactions   Latuda [Lurasidone] Other (See Comments)    Increased suicidal thoughts    Aripiprazole Other (See Comments)    Tardive Dyskinesia Other reaction(s): tardive dyskinesia   Bacitracin-Polymyxin B     Other reaction(s): rash   Losartan Potassium     Other reaction(s): decline gfr  Neo-Bacit-Poly-Lidocaine    Quetiapine Other (See Comments)    Tardive Dyskinesia Other reaction(s): tardive dyskinesia    Past Medical History:  Diagnosis Date   Allergic rhinitis    Bipolar disorder (HCC)    Cholelithiasis    seen on CAT 01/04/09   Chronic renal disease, stage III (HCC)    3/12   Complication of anesthesia    versed and fentanyl did not work for colonoscopy   Diabetic peripheral neuropathy (HCC) 07/18/2020   DM  (diabetes mellitus) (HCC)    Edema    Gait abnormality 08/26/2019   HLD (hyperlipidemia)    HTN (hypertension)    Iron deficiency anemia    3/12    Lymphedema    tarda   Neuropathy    Obstructive sleep apnea on CPAP    Osteoarthritis    Renal cyst    2.7 cm seen on CAT 01/04/09   Tardive dyskinesia     Past Medical History, Surgical history, Social history, and Family history were reviewed and updated as appropriate.   Please see review of systems for further details on the patient's review from today.   Objective:   Physical Exam:  There were no vitals taken for this visit.  Physical Exam Constitutional:      General: She is not in acute distress. Musculoskeletal:        General: No deformity.  Neurological:     Mental Status: She is alert and oriented to person, place, and time.     Coordination: Coordination normal.  Psychiatric:        Attention and Perception: Attention and perception normal. She does not perceive auditory or visual hallucinations.        Mood and Affect: Affect is not labile, blunt, angry or inappropriate.        Speech: Speech normal.        Behavior: Behavior normal.        Thought Content: Thought content normal. Thought content is not paranoid or delusional. Thought content does not include homicidal or suicidal ideation. Thought content does not include homicidal or suicidal plan.        Cognition and Memory: Cognition and memory normal.        Judgment: Judgment normal.     Comments: Insight intact     Lab Review:     Component Value Date/Time   NA 138 09/21/2022 0349   K 4.2 09/21/2022 0349   CL 106 09/21/2022 0349   CO2 22 09/21/2022 0349   GLUCOSE 99 09/21/2022 0349   BUN 19 09/21/2022 0349   CREATININE 1.20 (H) 09/21/2022 0349   CALCIUM 9.5 09/21/2022 0349   PROT 7.1 09/18/2022 1309   PROT 7.0 01/05/2020 1035   ALBUMIN 3.9 09/18/2022 1309   AST 21 09/18/2022 1309   ALT 15 09/18/2022 1309   ALKPHOS 53 09/18/2022 1309    BILITOT 0.6 09/18/2022 1309   GFRNONAA 47 (L) 09/21/2022 0349   GFRAA 47 (L) 12/06/2019 2242       Component Value Date/Time   WBC 7.3 09/21/2022 0349   RBC 3.76 (L) 09/21/2022 0349   HGB 12.9 09/21/2022 0349   HCT 37.8 09/21/2022 0349   PLT 156 09/21/2022 0349   MCV 100.5 (H) 09/21/2022 0349   MCH 34.3 (H) 09/21/2022 0349   MCHC 34.1 09/21/2022 0349   RDW 12.5 09/21/2022 0349   LYMPHSABS 0.7 10/31/2020 1107   MONOABS 0.5 10/31/2020 1107   EOSABS 0.2 10/31/2020 1107   BASOSABS 0.0  10/31/2020 1107    No results found for: "POCLITH", "LITHIUM"   No results found for: "PHENYTOIN", "PHENOBARB", "VALPROATE", "CBMZ"   .res Assessment: Plan:    Plan:  PDMP reviewed  Ingrezza 40mg  daily  Cymbalta 30mg  daily Zoloft 100mg  - 2 daily Lamictal 200mg  BID Wellbutrin XL 300mg  daily Xanax 1mg  BID for anxiety as needed - typically takes one a day.  RTC 6 months  Therapist - Bambi Cottle - trauma  Patient advised to contact office with any questions, adverse effects, or acute worsening in signs and symptoms.  Discussed potential benefits, risk, and side effects of benzodiazepines to include potential risk of tolerance and dependence, as well as possible drowsiness.  Advised patient not to drive if experiencing drowsiness and to take lowest possible effective dose to minimize risk of dependence and tolerance.  There are no diagnoses linked to this encounter.   Please see After Visit Summary for patient specific instructions.  Future Appointments  Date Time Provider Department Center  10/23/2023  1:40 PM Shakelia Scrivner, Thereasa Solo, NP [facility] None  04/19/2024 12:00 PM [facility] DX DEXA 1 [facility]    No orders of the defined types were placed in this encounter.   -------------------------------

## 2023-10-26 ENCOUNTER — Other Ambulatory Visit: Payer: Self-pay | Admitting: Adult Health

## 2023-10-26 DIAGNOSIS — F3181 Bipolar II disorder: Secondary | ICD-10-CM

## 2023-10-26 DIAGNOSIS — F411 Generalized anxiety disorder: Secondary | ICD-10-CM

## 2023-10-28 DIAGNOSIS — M8580 Other specified disorders of bone density and structure, unspecified site: Secondary | ICD-10-CM | POA: Diagnosis not present

## 2023-10-28 DIAGNOSIS — S92355D Nondisplaced fracture of fifth metatarsal bone, left foot, subsequent encounter for fracture with routine healing: Secondary | ICD-10-CM | POA: Diagnosis not present

## 2023-11-10 NOTE — Progress Notes (Signed)
[facility] Summary Report / Loop Recorder 

## 2023-11-17 ENCOUNTER — Ambulatory Visit (INDEPENDENT_AMBULATORY_CARE_PROVIDER_SITE_OTHER): Payer: Medicare Other

## 2023-11-17 DIAGNOSIS — I4891 Unspecified atrial fibrillation: Secondary | ICD-10-CM

## 2023-11-17 LAB — CUP PACEART REMOTE DEVICE CHECK
Date Time Interrogation Session: 20241227230415
Implantable Pulse Generator Implant Date: 20220411

## 2023-12-17 ENCOUNTER — Encounter: Payer: Self-pay | Admitting: Internal Medicine

## 2023-12-22 ENCOUNTER — Ambulatory Visit (INDEPENDENT_AMBULATORY_CARE_PROVIDER_SITE_OTHER): Payer: Medicare Other

## 2023-12-22 DIAGNOSIS — I4891 Unspecified atrial fibrillation: Secondary | ICD-10-CM | POA: Diagnosis not present

## 2023-12-23 LAB — CUP PACEART REMOTE DEVICE CHECK
Date Time Interrogation Session: 20250131230145
Implantable Pulse Generator Implant Date: 20220411

## 2024-01-19 ENCOUNTER — Encounter: Payer: Self-pay | Admitting: Internal Medicine

## 2024-01-26 ENCOUNTER — Ambulatory Visit (INDEPENDENT_AMBULATORY_CARE_PROVIDER_SITE_OTHER): Payer: Medicare Other

## 2024-01-26 DIAGNOSIS — I4891 Unspecified atrial fibrillation: Secondary | ICD-10-CM | POA: Diagnosis not present

## 2024-01-26 LAB — CUP PACEART REMOTE DEVICE CHECK
Date Time Interrogation Session: 20250309230530
Implantable Pulse Generator Implant Date: 20220411

## 2024-01-28 NOTE — Progress Notes (Signed)
 [facility] Summary Report / Loop Recorder

## 2024-01-28 NOTE — Addendum Note (Signed)
 Addended by: Geralyn Flash D on: 01/28/2024 03:04 PM   Modules accepted: Orders

## 2024-02-26 ENCOUNTER — Other Ambulatory Visit: Payer: Self-pay | Admitting: Adult Health

## 2024-02-26 DIAGNOSIS — G2401 Drug induced subacute dyskinesia: Secondary | ICD-10-CM

## 2024-02-27 ENCOUNTER — Other Ambulatory Visit: Payer: Self-pay

## 2024-02-27 ENCOUNTER — Telehealth: Payer: Self-pay

## 2024-02-27 DIAGNOSIS — F3181 Bipolar II disorder: Secondary | ICD-10-CM

## 2024-02-27 DIAGNOSIS — F411 Generalized anxiety disorder: Secondary | ICD-10-CM

## 2024-02-27 MED ORDER — ALPRAZOLAM 1 MG PO TABS: ORAL_TABLET | ORAL | 2 refills | Status: AC

## 2024-02-27 MED ORDER — SERTRALINE HCL 100 MG PO TABS: 100.0000 mg | ORAL_TABLET | Freq: Two times a day (BID) | ORAL | 5 refills | Status: AC

## 2024-02-27 NOTE — Telephone Encounter (Signed)
 Contacted pt about receiving a renewal for her patient assistance on Ingrezza. Confirmed with pt she still received through [facility] every 30 days, taking Ingrezza 40 mg daily.  Pt also was going to reach out to office for a refills on her Sertraline 100 mg #60 for 30 day and Xanax 1 mg #60 for 30 day. Let pt know those refills would be sent to [facility] per her request and I would renew her Rx for Ingrezza and submit form to [place].

## 2024-03-01 ENCOUNTER — Encounter: Payer: Self-pay | Admitting: Internal Medicine

## 2024-03-01 ENCOUNTER — Ambulatory Visit (INDEPENDENT_AMBULATORY_CARE_PROVIDER_SITE_OTHER): Payer: Medicare Other

## 2024-03-01 DIAGNOSIS — I4891 Unspecified atrial fibrillation: Secondary | ICD-10-CM

## 2024-03-01 LAB — CUP PACEART REMOTE DEVICE CHECK
Date Time Interrogation Session: 20250413230642
Implantable Pulse Generator Implant Date: 20220411

## 2024-03-01 NOTE — Progress Notes (Signed)
 [facility] Summary Report / Loop Recorder

## 2024-03-03 NOTE — Telephone Encounter (Signed)
 Orders have been renewed and fax for her pt assistance program for Ingrezza 40 mg. Pt does not have insurance for pharmacy.

## 2024-03-11 ENCOUNTER — Encounter: Payer: Self-pay | Admitting: Internal Medicine

## 2024-03-17 NOTE — Telephone Encounter (Signed)
 No further review needed

## 2024-04-05 ENCOUNTER — Ambulatory Visit (INDEPENDENT_AMBULATORY_CARE_PROVIDER_SITE_OTHER): Payer: Medicare Other

## 2024-04-05 DIAGNOSIS — I4891 Unspecified atrial fibrillation: Secondary | ICD-10-CM

## 2024-04-05 LAB — CUP PACEART REMOTE DEVICE CHECK
Date Time Interrogation Session: 20250518231052
Implantable Pulse Generator Implant Date: 20220411

## 2024-04-12 ENCOUNTER — Ambulatory Visit: Payer: Self-pay | Admitting: Cardiovascular Disease

## 2024-04-18 DEATH — deceased

## 2024-04-19 ENCOUNTER — Other Ambulatory Visit: Payer: Medicare Other

## 2024-04-21 NOTE — Progress Notes (Signed)
 [facility] Summary Report / Loop Recorder

## 2024-05-03 ENCOUNTER — Ambulatory Visit (INDEPENDENT_AMBULATORY_CARE_PROVIDER_SITE_OTHER): Payer: Medicare Other | Admitting: Adult Health

## 2024-05-03 DIAGNOSIS — Z0389 Encounter for observation for other suspected diseases and conditions ruled out: Secondary | ICD-10-CM

## 2024-05-03 NOTE — Progress Notes (Signed)
 Patient no show appointment. ? ?

## 2024-05-06 ENCOUNTER — Encounter

## 2024-05-26 NOTE — Progress Notes (Signed)
 [facility] Summary Report / Loop Recorder

## 2024-06-07 ENCOUNTER — Encounter

## 2024-07-08 ENCOUNTER — Encounter

## 2024-08-09 ENCOUNTER — Encounter

## 2024-08-20 ENCOUNTER — Encounter

## 2024-09-09 ENCOUNTER — Encounter

## 2024-09-20 ENCOUNTER — Encounter

## 2024-10-11 ENCOUNTER — Encounter

## 2024-10-21 ENCOUNTER — Encounter

## 2024-11-22 ENCOUNTER — Encounter

## 2024-12-23 ENCOUNTER — Encounter

## 2025-01-24 ENCOUNTER — Encounter
